# Patient Record
Sex: Male | Born: 1970 | Race: Black or African American | Hispanic: No | Marital: Married | Smoking: Former smoker
Health system: Southern US, Community
[De-identification: ages and names within clinical notes are randomized; demographics above are authoritative.]

## PROBLEM LIST (undated history)

## (undated) DIAGNOSIS — I10 Essential (primary) hypertension: Secondary | ICD-10-CM

## (undated) DIAGNOSIS — I639 Cerebral infarction, unspecified: Secondary | ICD-10-CM

## (undated) DIAGNOSIS — G4730 Sleep apnea, unspecified: Secondary | ICD-10-CM

## (undated) DIAGNOSIS — E119 Type 2 diabetes mellitus without complications: Secondary | ICD-10-CM

## (undated) DIAGNOSIS — G629 Polyneuropathy, unspecified: Secondary | ICD-10-CM

## (undated) DIAGNOSIS — Z79899 Other long term (current) drug therapy: Secondary | ICD-10-CM

## (undated) DIAGNOSIS — G35 Multiple sclerosis: Secondary | ICD-10-CM

## (undated) DIAGNOSIS — R51 Headache: Secondary | ICD-10-CM

## (undated) DIAGNOSIS — E785 Hyperlipidemia, unspecified: Secondary | ICD-10-CM

## (undated) DIAGNOSIS — IMO0002 Reserved for concepts with insufficient information to code with codable children: Secondary | ICD-10-CM

## (undated) DIAGNOSIS — I6990 Unspecified sequelae of unspecified cerebrovascular disease: Secondary | ICD-10-CM

## (undated) DIAGNOSIS — G35D Multiple sclerosis, unspecified: Secondary | ICD-10-CM

## (undated) HISTORY — DX: Cerebral infarction, unspecified: I63.9

## (undated) HISTORY — DX: Multiple sclerosis: G35

## (undated) HISTORY — PX: BRAIN BIOPSY: SHX905

## (undated) HISTORY — DX: Hyperlipidemia, unspecified: E78.5

## (undated) HISTORY — DX: Multiple sclerosis, unspecified: G35.D

## (undated) HISTORY — DX: Reserved for concepts with insufficient information to code with codable children: IMO0002

## (undated) HISTORY — PX: CARDIAC DEFIBRILLATOR PLACEMENT: SHX171

## (undated) HISTORY — DX: Other long term (current) drug therapy: Z79.899

## (undated) HISTORY — DX: Unspecified sequelae of unspecified cerebrovascular disease: I69.90

## (undated) HISTORY — DX: Sleep apnea, unspecified: G47.30

## (undated) HISTORY — DX: Polyneuropathy, unspecified: G62.9

---

## 2009-09-17 ENCOUNTER — Encounter: Admission: RE | Admit: 2009-09-17 | Discharge: 2009-09-17 | Payer: Self-pay | Admitting: *Deleted

## 2009-10-31 ENCOUNTER — Encounter: Admission: RE | Admit: 2009-10-31 | Discharge: 2009-10-31 | Payer: Self-pay | Admitting: Neurology

## 2010-12-14 ENCOUNTER — Encounter: Payer: Self-pay | Admitting: Family Medicine

## 2011-03-23 ENCOUNTER — Emergency Department (HOSPITAL_COMMUNITY)
Admission: EM | Admit: 2011-03-23 | Discharge: 2011-03-23 | Disposition: A | Discharge status: Home / Self Care | Payer: Medicare PPO | Attending: Emergency Medicine | Admitting: Emergency Medicine

## 2011-03-23 DIAGNOSIS — E119 Type 2 diabetes mellitus without complications: Secondary | ICD-10-CM | POA: Insufficient documentation

## 2011-03-23 DIAGNOSIS — Z79899 Other long term (current) drug therapy: Secondary | ICD-10-CM | POA: Insufficient documentation

## 2011-03-23 DIAGNOSIS — I10 Essential (primary) hypertension: Secondary | ICD-10-CM | POA: Insufficient documentation

## 2012-06-27 ENCOUNTER — Other Ambulatory Visit: Payer: Self-pay | Admitting: Family Medicine

## 2012-06-27 DIAGNOSIS — I69919 Unspecified symptoms and signs involving cognitive functions following unspecified cerebrovascular disease: Secondary | ICD-10-CM

## 2012-06-27 DIAGNOSIS — E785 Hyperlipidemia, unspecified: Secondary | ICD-10-CM

## 2012-07-18 ENCOUNTER — Other Ambulatory Visit: Payer: Medicare PPO

## 2012-07-19 ENCOUNTER — Ambulatory Visit
Admission: RE | Admit: 2012-07-19 | Discharge: 2012-07-19 | Disposition: A | Discharge status: Home / Self Care | Payer: Medicare PPO | Source: Ambulatory Visit | Attending: Family Medicine | Admitting: Family Medicine

## 2012-07-19 DIAGNOSIS — E785 Hyperlipidemia, unspecified: Secondary | ICD-10-CM

## 2012-07-19 DIAGNOSIS — I69919 Unspecified symptoms and signs involving cognitive functions following unspecified cerebrovascular disease: Secondary | ICD-10-CM

## 2013-06-01 ENCOUNTER — Observation Stay (HOSPITAL_COMMUNITY): Payer: Medicare PPO

## 2013-06-01 ENCOUNTER — Encounter (HOSPITAL_COMMUNITY): Payer: Self-pay | Admitting: Cardiology

## 2013-06-01 ENCOUNTER — Emergency Department (HOSPITAL_COMMUNITY): Payer: Medicare PPO

## 2013-06-01 ENCOUNTER — Inpatient Hospital Stay (HOSPITAL_COMMUNITY)
Admission: EM | Admit: 2013-06-01 | Discharge: 2013-06-06 | DRG: 065 | Disposition: A | Discharge status: Rehabilitation Facility | Payer: Medicare PPO | Attending: Internal Medicine | Admitting: Internal Medicine

## 2013-06-01 DIAGNOSIS — R471 Dysarthria and anarthria: Secondary | ICD-10-CM

## 2013-06-01 DIAGNOSIS — E785 Hyperlipidemia, unspecified: Secondary | ICD-10-CM | POA: Diagnosis present

## 2013-06-01 DIAGNOSIS — I639 Cerebral infarction, unspecified: Secondary | ICD-10-CM

## 2013-06-01 DIAGNOSIS — E1142 Type 2 diabetes mellitus with diabetic polyneuropathy: Secondary | ICD-10-CM | POA: Diagnosis present

## 2013-06-01 DIAGNOSIS — G8190 Hemiplegia, unspecified affecting unspecified side: Secondary | ICD-10-CM | POA: Diagnosis present

## 2013-06-01 DIAGNOSIS — E1149 Type 2 diabetes mellitus with other diabetic neurological complication: Secondary | ICD-10-CM | POA: Diagnosis present

## 2013-06-01 DIAGNOSIS — R1310 Dysphagia, unspecified: Secondary | ICD-10-CM | POA: Diagnosis present

## 2013-06-01 DIAGNOSIS — R29810 Facial weakness: Secondary | ICD-10-CM | POA: Diagnosis present

## 2013-06-01 DIAGNOSIS — I6350 Cerebral infarction due to unspecified occlusion or stenosis of unspecified cerebral artery: Principal | ICD-10-CM

## 2013-06-01 DIAGNOSIS — I10 Essential (primary) hypertension: Secondary | ICD-10-CM

## 2013-06-01 HISTORY — DX: Type 2 diabetes mellitus without complications: E11.9

## 2013-06-01 HISTORY — DX: Cerebral infarction, unspecified: I63.9

## 2013-06-01 HISTORY — DX: Essential (primary) hypertension: I10

## 2013-06-01 LAB — URINALYSIS, ROUTINE W REFLEX MICROSCOPIC
Glucose, UA: NEGATIVE mg/dL
Hgb urine dipstick: NEGATIVE
Ketones, ur: NEGATIVE mg/dL
Leukocytes, UA: NEGATIVE
Nitrite: NEGATIVE
Protein, ur: NEGATIVE mg/dL
Specific Gravity, Urine: 1.034 — ABNORMAL HIGH (ref 1.005–1.030)
Urobilinogen, UA: 1 mg/dL (ref 0.0–1.0)
pH: 6 (ref 5.0–8.0)

## 2013-06-01 LAB — COMPREHENSIVE METABOLIC PANEL
ALT: 23 U/L (ref 0–53)
AST: 26 U/L (ref 0–37)
Albumin: 4 g/dL (ref 3.5–5.2)
Alkaline Phosphatase: 35 U/L — ABNORMAL LOW (ref 39–117)
BUN: 29 mg/dL — ABNORMAL HIGH (ref 6–23)
CO2: 24 mEq/L (ref 19–32)
Calcium: 9.3 mg/dL (ref 8.4–10.5)
Chloride: 103 mEq/L (ref 96–112)
Creatinine, Ser: 0.96 mg/dL (ref 0.50–1.35)
GFR calc Af Amer: >90 mL/min (ref >90)
GFR calc non Af Amer: >90 mL/min (ref >90)
Glucose, Bld: 97 mg/dL (ref 70–99)
Potassium: 3.8 mEq/L (ref 3.5–5.1)
Sodium: 140 mEq/L (ref 135–145)
Total Bilirubin: 0.5 mg/dL (ref 0.3–1.2)
Total Protein: 7.5 g/dL (ref 6.0–8.3)

## 2013-06-01 LAB — GLUCOSE, CAPILLARY: Glucose-Capillary: 106 mg/dL — ABNORMAL HIGH (ref 70–99)

## 2013-06-01 LAB — CBC
HCT: 41 % (ref 39.0–52.0)
Hemoglobin: 14 g/dL (ref 13.0–17.0)
MCH: 29.1 pg (ref 26.0–34.0)
MCHC: 34.1 g/dL (ref 30.0–36.0)
MCV: 85.2 fL (ref 78.0–100.0)
Platelets: 235 10*3/uL (ref 150–400)
RBC: 4.81 MIL/uL (ref 4.22–5.81)
RDW: 12.8 % (ref 11.5–15.5)
WBC: 8 10*3/uL (ref 4.0–10.5)

## 2013-06-01 LAB — DIFFERENTIAL
Basophils Absolute: 0 10*3/uL (ref 0.0–0.1)
Basophils Relative: 0 % (ref 0–1)
Eosinophils Absolute: 0.1 10*3/uL (ref 0.0–0.7)
Eosinophils Relative: 1 % (ref 0–5)
Lymphocytes Relative: 24 % (ref 12–46)
Lymphs Abs: 1.9 10*3/uL (ref 0.7–4.0)
Monocytes Absolute: 0.6 10*3/uL (ref 0.1–1.0)
Monocytes Relative: 7 % (ref 3–12)
Neutro Abs: 5.4 10*3/uL (ref 1.7–7.7)
Neutrophils Relative %: 68 % (ref 43–77)

## 2013-06-01 LAB — RAPID URINE DRUG SCREEN, HOSP PERFORMED
Amphetamines: NOT DETECTED
Barbiturates: NOT DETECTED
Benzodiazepines: NOT DETECTED
Cocaine: NOT DETECTED
Opiates: NOT DETECTED
Tetrahydrocannabinol: NOT DETECTED

## 2013-06-01 LAB — POCT I-STAT TROPONIN I: Troponin i, poc: 0.02 ng/mL (ref 0.00–0.08)

## 2013-06-01 LAB — ANTITHROMBIN III: AntiThromb III Func: 107 % (ref 75–120)

## 2013-06-01 LAB — TROPONIN I: Troponin I: <0.3 ng/mL (ref <0.30)

## 2013-06-01 LAB — PROTIME-INR
INR: 1.03 (ref 0.00–1.49)
Prothrombin Time: 13.3 seconds (ref 11.6–15.2)

## 2013-06-01 LAB — ETHANOL: Alcohol, Ethyl (B): <11 mg/dL (ref 0–11)

## 2013-06-01 LAB — HEMOGLOBIN A1C
Hgb A1c MFr Bld: 6.3 % — ABNORMAL HIGH (ref <5.7)
Mean Plasma Glucose: 134 mg/dL — ABNORMAL HIGH (ref <117)

## 2013-06-01 LAB — APTT: aPTT: 29 seconds (ref 24–37)

## 2013-06-01 MED ORDER — INSULIN ASPART 100 UNIT/ML ~~LOC~~ SOLN
0.0000 [IU] | Freq: Three times a day (TID) | SUBCUTANEOUS | Status: DC
  Administered 2013-06-02 – 2013-06-05 (×3): 1 [IU] via SUBCUTANEOUS

## 2013-06-01 MED ORDER — METFORMIN HCL ER 500 MG PO TB24: 1000.0000 mg | ORAL_TABLET | Freq: Two times a day (BID) | ORAL | Status: DC

## 2013-06-01 MED ORDER — PREGABALIN 75 MG PO CAPS
150.0000 mg | ORAL_CAPSULE | Freq: Two times a day (BID) | ORAL | Status: DC
  Administered 2013-06-01 – 2013-06-06 (×10): 150 mg via ORAL
  Filled 2013-06-01 (×7): qty 2
  Filled 2013-06-01: qty 1
  Filled 2013-06-01: qty 2
  Filled 2013-06-01: qty 1
  Filled 2013-06-01: qty 2

## 2013-06-01 MED ORDER — CLOPIDOGREL BISULFATE 75 MG PO TABS
75.0000 mg | ORAL_TABLET | Freq: Every day | ORAL | Status: DC
  Administered 2013-06-01 – 2013-06-06 (×6): 75 mg via ORAL
  Filled 2013-06-01 (×7): qty 1

## 2013-06-01 MED ORDER — ATORVASTATIN CALCIUM 40 MG PO TABS
40.0000 mg | ORAL_TABLET | Freq: Every day | ORAL | Status: DC
  Administered 2013-06-01 – 2013-06-05 (×5): 40 mg via ORAL
  Filled 2013-06-01 (×6): qty 1

## 2013-06-01 MED ORDER — AMLODIPINE BESYLATE 5 MG PO TABS
5.0000 mg | ORAL_TABLET | Freq: Every day | ORAL | Status: DC
  Filled 2013-06-01: qty 1

## 2013-06-01 MED ORDER — METFORMIN HCL ER 500 MG PO TB24
1000.0000 mg | ORAL_TABLET | Freq: Two times a day (BID) | ORAL | Status: DC
  Administered 2013-06-02 – 2013-06-06 (×9): 1000 mg via ORAL
  Filled 2013-06-01 (×11): qty 2

## 2013-06-01 MED ORDER — INSULIN ASPART 100 UNIT/ML ~~LOC~~ SOLN: 0.0000 [IU] | Freq: Every day | SUBCUTANEOUS | Status: DC

## 2013-06-01 NOTE — Consult Note (Signed)
Referring Physician: Fonnie Jarvis    Chief Complaint: stroke  HPI:                                                                                                                                         Thomas Nixon is an 42 y.o. 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  He tells me he has had full workup in past but all the work-ups have been in different hospitals. These include, [facility], [facility], [facility] and [facility]. He does not recall having a TEE in the past nor does he recall being told he has a clotting deficiency. He has only been on Aggrenox in the past but wife feels like he was on Coumadin but was taken off this for some unknown reason--(she is not sure of this). [facility] was [facility] due to having a stroke last Saturday while on a cruise. Symptoms consisted of left facial droop, dysarthria and left leg/arm numbness.  On Sunday the ship made port in [place] and patient was brought to hospital where only MRI (per wife) was done.  He did not want to stay there and came back to [facility] where he lives to go to hospital here (for both care and possibility of going to rehab). [facility] obtained at [facility] showed chronic hemorrhagic infarcts bilaterally and acute/subacute infarct in the posterior limb internal capsule and deep white matter in the right frontal parietal lobe.    Date last known well: 7.6.14 Time last known well: Unable to determine tPA Given: No: out of window  Past Medical History  Diagnosis Date  . Stroke     5 strokes  . Hypertension   . Diabetes mellitus without complication     History reviewed. No pertinent past surgical history.  Family history: No strokes Social History:  reports that he has never smoked. He does not have any smokeless tobacco history on file. He reports that  drinks alcohol. He reports that he does not use illicit drugs.  Allergies: No Known Allergies  Medications:                                                                                                                            No current facility-administered medications for this encounter.   Current Outpatient Prescriptions  Medication Sig Dispense Refill  .  amLODipine (NORVASC) 5 MG tablet Take 5 mg by mouth daily.      Marland Kitchen atorvastatin (LIPITOR) 40 MG tablet Take 40 mg by mouth at bedtime.      . dipyridamole-aspirin (AGGRENOX) 200-25 MG per 12 hr capsule Take 1 capsule by mouth 2 (two) times daily.      . metFORMIN (GLUMETZA) 1000 MG (MOD) 24 hr tablet Take 1,000 mg by mouth 2 (two) times daily.      . pregabalin (LYRICA) 150 MG capsule Take 150 mg by mouth 2 (two) times daily.      Marland Kitchen spironolactone (ALDACTONE) 50 MG tablet Take 50 mg by mouth daily.         ROS:                                                                                                                                       History obtained from the patient  General ROS: negative for - chills, fatigue, fever, night sweats, weight gain or weight loss Psychological ROS: negative for - behavioral disorder, hallucinations, memory difficulties, mood swings or suicidal ideation Ophthalmic ROS: negative for - blurry vision, double vision, eye pain or loss of vision ENT ROS: negative for - epistaxis, nasal discharge, oral lesions, sore throat, tinnitus or vertigo Allergy and Immunology ROS: negative for - hives or itchy/watery eyes Hematological and Lymphatic ROS: negative for - bleeding problems, bruising or swollen lymph nodes Endocrine ROS: negative for - galactorrhea, hair pattern changes, polydipsia/polyuria or temperature intolerance Respiratory ROS: negative for - cough, hemoptysis, shortness of breath or wheezing Cardiovascular ROS: negative for - chest pain, dyspnea on exertion, edema or irregular heartbeat Gastrointestinal ROS: negative for - abdominal pain, diarrhea, hematemesis, nausea/vomiting or stool incontinence Genito-Urinary ROS: negative for - dysuria, hematuria,  incontinence or urinary frequency/urgency Musculoskeletal ROS: negative for - joint swelling or muscular weakness Neurological ROS: as noted in HPI Dermatological ROS: negative for rash and skin lesion changes  Neurologic Examination:                                                                                                      Blood pressure 119/77, pulse 88, temperature 98.2 F (36.8 C), temperature source Oral, resp. rate 18, SpO2 98.00%.  Mental Status: Alert, oriented, thought content appropriate.  Speech dysarthric without evidence of aphasia.  Able to follow 3 step commands without difficulty. Cranial Nerves: II: Discs flat bilaterally; Visual fields grossly normal, pupils equal, round, reactive to light and accommodation III,IV,  VI: ptosis not present, extra-ocular motions intact bilaterally V,VII: smile asymmetric on the left, facial light touch sensation decreased on the left VIII: hearing normal bilaterally IX,X: gag reflex present XI: bilateral shoulder shrug XII: midline tongue extension Motor: Right : Upper extremity   5/5    Left:     Upper extremity   4/5  Lower extremity   5/5     Lower extremity   5/5 --drift on left arm and leg Tone and bulk:normal tone throughout; no atrophy noted Sensory: Pinprick and light touch decreased on left arm and leg Deep Tendon Reflexes:  Right: Upper Extremity   Left: Upper extremity   biceps (C-5 to C-6) 2/4   biceps (C-5 to C-6) 2/4 tricep (C7) 2/4    triceps (C7) 2/4 Brachioradialis (C6) 2/4  Brachioradialis (C6) 2/4  Lower Extremity Lower Extremity  quadriceps (L-2 to L-4) 2/4   quadriceps (L-2 to L-4) 2/4 Achilles (S1) 2/4   Achilles (S1) 2/4  Plantars: Right: downgoing   Left: mute Cerebellar: normal finger-to-nose,  normal heel-to-shin test Gait: not tested CV: pulses palpable throughout    Results for orders placed during the hospital encounter of 06/01/13 (from the past 48 hour(s))  GLUCOSE, CAPILLARY      Status: Abnormal   Collection Time    06/01/13  1:07 PM      Result Value Range   Glucose-Capillary 106 (*) 70 - 99 mg/dL  URINE RAPID DRUG SCREEN (HOSP PERFORMED)     Status: None   Collection Time    06/01/13  1:36 PM      Result Value Range   Opiates NONE DETECTED  NONE DETECTED   Cocaine NONE DETECTED  NONE DETECTED   Benzodiazepines NONE DETECTED  NONE DETECTED   Amphetamines NONE DETECTED  NONE DETECTED   Tetrahydrocannabinol NONE DETECTED  NONE DETECTED   Barbiturates NONE DETECTED  NONE DETECTED   Comment:            DRUG SCREEN FOR MEDICAL PURPOSES     ONLY.  IF CONFIRMATION IS NEEDED     FOR ANY PURPOSE, NOTIFY LAB     WITHIN 5 DAYS.                LOWEST DETECTABLE LIMITS     FOR URINE DRUG SCREEN     Drug Class       Cutoff (ng/mL)     Amphetamine      1000     Barbiturate      200     Benzodiazepine   200     Tricyclics       300     Opiates          300     Cocaine          300     THC              50  URINALYSIS, ROUTINE W REFLEX MICROSCOPIC     Status: Abnormal   Collection Time    06/01/13  1:36 PM      Result Value Range   Color, Urine AMBER (*) YELLOW   Comment: BIOCHEMICALS MAY BE AFFECTED BY COLOR   APPearance CLEAR  CLEAR   Specific Gravity, Urine 1.034 (*) 1.005 - 1.030   pH 6.0  5.0 - 8.0   Glucose, UA NEGATIVE  NEGATIVE mg/dL   Hgb urine dipstick NEGATIVE  NEGATIVE   Bilirubin Urine SMALL (*) NEGATIVE   Ketones, ur NEGATIVE  NEGATIVE mg/dL   Protein, ur NEGATIVE  NEGATIVE mg/dL   Urobilinogen, UA 1.0  0.0 - 1.0 mg/dL   Nitrite NEGATIVE  NEGATIVE   Leukocytes, UA NEGATIVE  NEGATIVE   Comment: MICROSCOPIC NOT DONE ON URINES WITH NEGATIVE PROTEIN, BLOOD, LEUKOCYTES, NITRITE, OR GLUCOSE <1000 mg/dL.  ETHANOL     Status: None   Collection Time    06/01/13  2:23 PM      Result Value Range   Alcohol, Ethyl (B) <11  0 - 11 mg/dL   Comment:            LOWEST DETECTABLE LIMIT FOR     SERUM ALCOHOL IS 11 mg/dL     FOR MEDICAL PURPOSES ONLY   PROTIME-INR     Status: None   Collection Time    06/01/13  2:23 PM      Result Value Range   Prothrombin Time 13.3  11.6 - 15.2 seconds   INR 1.03  0.00 - 1.49  APTT     Status: None   Collection Time    06/01/13  2:23 PM      Result Value Range   aPTT 29  24 - 37 seconds  CBC     Status: None   Collection Time    06/01/13  2:23 PM      Result Value Range   WBC 8.0  4.0 - 10.5 K/uL   RBC 4.81  4.22 - 5.81 MIL/uL   Hemoglobin 14.0  13.0 - 17.0 g/dL   HCT 21.3  08.6 - 57.8 %   MCV 85.2  78.0 - 100.0 fL   MCH 29.1  26.0 - 34.0 pg   MCHC 34.1  30.0 - 36.0 g/dL   RDW 46.9  62.9 - 52.8 %   Platelets 235  150 - 400 K/uL  DIFFERENTIAL     Status: None   Collection Time    06/01/13  2:23 PM      Result Value Range   Neutrophils Relative % 68  43 - 77 %   Neutro Abs 5.4  1.7 - 7.7 K/uL   Lymphocytes Relative 24  12 - 46 %   Lymphs Abs 1.9  0.7 - 4.0 K/uL   Monocytes Relative 7  3 - 12 %   Monocytes Absolute 0.6  0.1 - 1.0 K/uL   Eosinophils Relative 1  0 - 5 %   Eosinophils Absolute 0.1  0.0 - 0.7 K/uL   Basophils Relative 0  0 - 1 %   Basophils Absolute 0.0  0.0 - 0.1 K/uL  COMPREHENSIVE METABOLIC PANEL     Status: Abnormal   Collection Time    06/01/13  2:23 PM      Result Value Range   Sodium 140  135 - 145 mEq/L   Potassium 3.8  3.5 - 5.1 mEq/L   Chloride 103  96 - 112 mEq/L   CO2 24  19 - 32 mEq/L   Glucose, Bld 97  70 - 99 mg/dL   BUN 29 (*) 6 - 23 mg/dL   Creatinine, Ser 4.13  0.50 - 1.35 mg/dL   Calcium 9.3  8.4 - 24.4 mg/dL   Total Protein 7.5  6.0 - 8.3 g/dL   Albumin 4.0  3.5 - 5.2 g/dL   AST 26  0 - 37 U/L   ALT 23  0 - 53 U/L   Alkaline Phosphatase 35 (*) 39 - 117 U/L   Total Bilirubin 0.5  0.3 -  1.2 mg/dL   GFR calc non Af Amer >90  >90 mL/min   GFR calc Af Amer >90  >90 mL/min   Comment:            The eGFR has been calculated     using the CKD EPI equation.     This calculation has not been     validated in all clinical     situations.      eGFR's persistently     <90 mL/min signify     possible Chronic Kidney Disease.  TROPONIN I     Status: None   Collection Time    06/01/13  2:23 PM      Result Value Range   Troponin I <0.30  <0.30 ng/mL   Comment:            Due to the release kinetics of cTnI,     a negative result within the first hours     of the onset of symptoms does not rule out     myocardial infarction with certainty.     If myocardial infarction is still suspected,     repeat the test at appropriate intervals.  POCT I-STAT TROPONIN I     Status: None   Collection Time    06/01/13  2:45 PM      Result Value Range   Troponin i, poc 0.02  0.00 - 0.08 ng/mL   Comment 3            Comment: Due to the release kinetics of cTnI,     a negative result within the first hours     of the onset of symptoms does not rule out     myocardial infarction with certainty.     If myocardial infarction is still suspected,     repeat the test at appropriate intervals.   Mr Maxine Glenn Head Wo Contrast  06/01/2013   *RADIOLOGY REPORT*  Clinical Data:  Left-sided weakness.  Stroke.  Hypertension and diabetes  MRI HEAD WITHOUT CONTRAST MRA HEAD WITHOUT CONTRAST  Technique:  Multiplanar, multiecho pulse sequences of the brain and surrounding structures were obtained without intravenous contrast. Angiographic images of the head were obtained using MRA technique without contrast.  Comparison:  None  MRI HEAD  Findings:  There is considerable hyperintensity in the cerebral white matter bilaterally consistent with chronic microvascular ischemia.  There is a chronic hemorrhagic infarct in the right frontal parietal lobe.  There is a chronic hemorrhagic infarct in the left parietal white matter.  Diffusion weighted imaging shows hyperintensity in the posterior limb internal capsule on the right extending into the cerebral white matter.  This appears to have restricted diffusion consistent with acute or  subacute infarct.  Ventricle size is normal.  No  midline shift.  No neoplastic process is identified.  IMPRESSION: The extensive chronic ischemic changes for age consist with diabetes and hypertension.  Chronic hemorrhagic infarcts are present bilaterally.  Acute/subacute infarct in the posterior limb internal capsule and deep white matter in the right frontal parietal lobe.  MRA HEAD  Findings: Vertebral arteries are patent to the basilar without stenosis.  PICA, superior cerebellar, and posterior cerebral arteries are patent bilaterally.  Basilar is widely patent.  Cavernous carotid is widely patent bilaterally.  Anterior and middle cerebral arteries are patent bilaterally without significant stenosis.  Negative for cerebral aneurysm.  IMPRESSION: No significant intracranial stenosis.   Original Report Authenticated By: Janeece Riggers, M.D.   Mr Brain Ilda Basset  Contrast  06/01/2013   *RADIOLOGY REPORT*  Clinical Data:  Left-sided weakness.  Stroke.  Hypertension and diabetes  MRI HEAD WITHOUT CONTRAST MRA HEAD WITHOUT CONTRAST  Technique:  Multiplanar, multiecho pulse sequences of the brain and surrounding structures were obtained without intravenous contrast. Angiographic images of the head were obtained using MRA technique without contrast.  Comparison:  None  MRI HEAD  Findings:  There is considerable hyperintensity in the cerebral white matter bilaterally consistent with chronic microvascular ischemia.  There is a chronic hemorrhagic infarct in the right frontal parietal lobe.  There is a chronic hemorrhagic infarct in the left parietal white matter.  Diffusion weighted imaging shows hyperintensity in the posterior limb internal capsule on the right extending into the cerebral white matter.  This appears to have restricted diffusion consistent with acute or  subacute infarct.  Ventricle size is normal.  No midline shift.  No neoplastic process is identified.  IMPRESSION: The extensive chronic ischemic changes for age consist with diabetes and hypertension.  Chronic  hemorrhagic infarcts are present bilaterally.  Acute/subacute infarct in the posterior limb internal capsule and deep white matter in the right frontal parietal lobe.  MRA HEAD  Findings: Vertebral arteries are patent to the basilar without stenosis.  PICA, superior cerebellar, and posterior cerebral arteries are patent bilaterally.  Basilar is widely patent.  Cavernous carotid is widely patent bilaterally.  Anterior and middle cerebral arteries are patent bilaterally without significant stenosis.  Negative for cerebral aneurysm.  IMPRESSION: No significant intracranial stenosis.   Original Report Authenticated By: Janeece Riggers, M.D.    Assessment and plan discussed with with attending physician and they are in agreement.    Felicie Morn PA-C [facility] [phone]  06/01/2013, 3:33 PM   Assessment: 42 y.o. male with multiple strokes in the past now presenting with subacute stroke that occurred 5 days ago while on a cruise.  It appears that very little in the way of stroke workup was done at the other hospital, and therefore I would favor admitting for further stroke workup including echocardiogram as well as carotid Dopplers.  He apparently has been on Coumadin in the past but this was stopped, it is unclear to me if he has never been formally diagnosed with a hypercoagulable state. The last time he was worked up was in 2009.  Stroke Risk Factors - diabetes mellitus, hyperlipidemia and hypertension  Plan: 1. HgbA1c, fasting lipid panel 4. Echocardiogram 5. Carotid dopplers 6. Prophylactic therapy-Antiplatelet med: Plavix - dose 75 mg daily 7. Risk factor modification 8. Hypercoagulable panel 9. Telemetry monitoring 10. Frequent neuro checks 11. PT/OT SLP  Ritta Slot, MD [facility] [phone]  If 7pm- 7am, please page neurology on call at [phone].

## 2013-06-01 NOTE — ED Notes (Signed)
Notified lab of need for blood work  

## 2013-06-01 NOTE — H&P (Signed)
[facility] History and Physical  Thomas Nixon GEX:[id number] DOB: Mar 17, 1971    PCP:   Thomas Saupe, MD   Chief Complaint: Seeking rehab after having stroke last week.  HPI: Thomas Nixon is an 42 y.o. 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  He had left side facial droop along with left sided weakness and increased dysarthria last week while he was on a cruise.  MRI done last week reportedly showed an acute CVA and he was recommended rehab, but preferred to be in [place].  He tells me he had complete work up several years ago, and had a brain biopsy, though they don't know what it showed.  MRI repeated here without contrast showed acute, subacute infarct in the internal capsule and deep right frontoparietal area with chronic hemorrhagic infarcts.  EKG showed NSR, and serology was unramarkable.  Neurology was consulted and recommended full repeat CVA work up, to change to Plavix, and to include thrombophilia work up.  Hospitalist was asked to admit him.  Rewiew of Systems:  Constitutional: Negative for malaise, fever and chills. No significant weight loss or weight gain Eyes: Negative for eye pain, redness and discharge, diplopia, visual changes, or flashes of light. ENMT: Negative for ear pain, hoarseness, nasal congestion, sinus pressure and sore throat. No headaches; tinnitus, drooling, or problem swallowing. Cardiovascular: Negative for chest pain, palpitations, diaphoresis, dyspnea and peripheral edema. ; No orthopnea, PND Respiratory: Negative for cough, hemoptysis, wheezing and stridor. No pleuritic chestpain. Gastrointestinal: Negative for nausea, vomiting, diarrhea, constipation, abdominal pain, melena, blood in stool, hematemesis, jaundice and rectal bleeding.    Genitourinary: Negative for frequency, dysuria, incontinence,flank pain and hematuria; Musculoskeletal: Negative for back pain and  neck pain. Negative for swelling and trauma.;  Skin: . Negative for pruritus, rash, abrasions, bruising and skin lesion.; ulcerations Neuro: Negative for headache, lightheadedness and neck stiffness. Negative altered level of consciousness , altered mental status,  burning feet, involuntary movement, seizure and syncope.  Psych: negative for anxiety, depression, insomnia, tearfulness, panic attacks, hallucinations, paranoia, suicidal or homicidal ideation    Past Medical History  Diagnosis Date  . Stroke     5 strokes  . Hypertension   . Diabetes mellitus without complication     History reviewed. No pertinent past surgical history.  Medications:  HOME MEDS: Prior to Admission medications   Medication Sig Start Date End Date Taking? Authorizing Provider  amLODipine (NORVASC) 5 MG tablet Take 5 mg by mouth daily.   Yes Historical Provider, MD  atorvastatin (LIPITOR) 40 MG tablet Take 40 mg by mouth at bedtime.   Yes Historical Provider, MD  dipyridamole-aspirin (AGGRENOX) 200-25 MG per 12 hr capsule Take 1 capsule by mouth 2 (two) times daily.   Yes Historical Provider, MD  metFORMIN (GLUMETZA) 1000 MG (MOD) 24 hr tablet Take 1,000 mg by mouth 2 (two) times daily.   Yes Historical Provider, MD  pregabalin (LYRICA) 150 MG capsule Take 150 mg by mouth 2 (two) times daily.   Yes Historical Provider, MD  spironolactone (ALDACTONE) 50 MG tablet Take 50 mg by mouth daily.   Yes Historical Provider, MD     Allergies:  No Known Allergies  Social History:   reports that he has never smoked. He does not have any smokeless tobacco history on file. He reports that  drinks alcohol. He reports that he does not use illicit drugs.  Family History: History reviewed.  No pertinent family history.   Physical Exam: Filed Vitals:   06/01/13 1915 06/01/13 1930 06/01/13 1945 06/01/13 2000  BP: 129/81 135/82 121/72 112/75  Pulse: 64 64 66 67  Temp:      TempSrc:      Resp:      SpO2: 100% 100%  99% 99%   Blood pressure 112/75, pulse 67, temperature 98.8 F (37.1 C), temperature source Oral, resp. rate 18, SpO2 99.00%.  GEN:  Pleasant patient lying in the stretcher in no acute distress; cooperative with exam. PSYCH:  alert and oriented x4; does not appear anxious or depressed; affect is appropriate. HEENT: Mucous membranes pink and anicteric; PERRLA; EOM intact; no cervical lymphadenopathy nor thyromegaly or carotid bruit; no JVD; There were no stridor. Neck is very supple. Breasts:: Not examined CHEST WALL: No tenderness CHEST: Normal respiration, clear to auscultation bilaterally.  HEART: Regular rate and rhythm.  There are no murmur, rub, or gallops.   BACK: No kyphosis or scoliosis; no CVA tenderness ABDOMEN: soft and non-tender; no masses, no organomegaly, normal abdominal bowel sounds; no pannus; no intertriginous candida. There is no rebound and no distention. Rectal Exam: Not done EXTREMITIES: No bone or joint deformity; age-appropriate arthropathy of the hands and knees; no edema; no ulcerations.  There is no calf tenderness. Genitalia: not examined PULSES: 2+ and symmetric SKIN: Normal hydration no rash or ulceration CNS: Cranial nerves 2-12 grossly intact no focal lateralizing neurologic deficit.  Speech is dysarthric, uvula elevated with phonation, facial symmetry and tongue midline. DTR are normal bilaterally, cerebella exam is intact, barbinski is negative and strengths are equaled bilaterally.  No sensory loss.   Labs on Admission:  Basic Metabolic Panel:  Recent Labs Lab 06/01/13 1423  NA 140  K 3.8  CL 103  CO2 24  GLUCOSE 97  BUN 29*  CREATININE 0.96  CALCIUM 9.3   Liver Function Tests:  Recent Labs Lab 06/01/13 1423  AST 26  ALT 23  ALKPHOS 35*  BILITOT 0.5  PROT 7.5  ALBUMIN 4.0   No results found for this basename: LIPASE, AMYLASE,  in the last 168 hours No results found for this basename: AMMONIA,  in the last 168 hours CBC:  Recent  Labs Lab 06/01/13 1423  WBC 8.0  NEUTROABS 5.4  HGB 14.0  HCT 41.0  MCV 85.2  PLT 235   Cardiac Enzymes:  Recent Labs Lab 06/01/13 1423  TROPONINI <0.30    CBG:  Recent Labs Lab 06/01/13 1307  GLUCAP 106*     Radiological Exams on Admission: Mr Maxine Glenn Head Wo Contrast  06/01/2013   *RADIOLOGY REPORT*  Clinical Data:  Left-sided weakness.  Stroke.  Hypertension and diabetes  MRI HEAD WITHOUT CONTRAST MRA HEAD WITHOUT CONTRAST  Technique:  Multiplanar, multiecho pulse sequences of the brain and surrounding structures were obtained without intravenous contrast. Angiographic images of the head were obtained using MRA technique without contrast.  Comparison:  None  MRI HEAD  Findings:  There is considerable hyperintensity in the cerebral white matter bilaterally consistent with chronic microvascular ischemia.  There is a chronic hemorrhagic infarct in the right frontal parietal lobe.  There is a chronic hemorrhagic infarct in the left parietal white matter.  Diffusion weighted imaging shows hyperintensity in the posterior limb internal capsule on the right extending into the cerebral white matter.  This appears to have restricted diffusion consistent with acute or  subacute infarct.  Ventricle size is normal.  No midline shift.  No neoplastic process  is identified.  IMPRESSION: The extensive chronic ischemic changes for age consist with diabetes and hypertension.  Chronic hemorrhagic infarcts are present bilaterally.  Acute/subacute infarct in the posterior limb internal capsule and deep white matter in the right frontal parietal lobe.  MRA HEAD  Findings: Vertebral arteries are patent to the basilar without stenosis.  PICA, superior cerebellar, and posterior cerebral arteries are patent bilaterally.  Basilar is widely patent.  Cavernous carotid is widely patent bilaterally.  Anterior and middle cerebral arteries are patent bilaterally without significant stenosis.  Negative for cerebral  aneurysm.  IMPRESSION: No significant intracranial stenosis.   Original Report Authenticated By: Janeece Riggers, M.D.   Mr Brain Wo Contrast  06/01/2013   *RADIOLOGY REPORT*  Clinical Data:  Left-sided weakness.  Stroke.  Hypertension and diabetes  MRI HEAD WITHOUT CONTRAST MRA HEAD WITHOUT CONTRAST  Technique:  Multiplanar, multiecho pulse sequences of the brain and surrounding structures were obtained without intravenous contrast. Angiographic images of the head were obtained using MRA technique without contrast.  Comparison:  None  MRI HEAD  Findings:  There is considerable hyperintensity in the cerebral white matter bilaterally consistent with chronic microvascular ischemia.  There is a chronic hemorrhagic infarct in the right frontal parietal lobe.  There is a chronic hemorrhagic infarct in the left parietal white matter.  Diffusion weighted imaging shows hyperintensity in the posterior limb internal capsule on the right extending into the cerebral white matter.  This appears to have restricted diffusion consistent with acute or  subacute infarct.  Ventricle size is normal.  No midline shift.  No neoplastic process is identified.  IMPRESSION: The extensive chronic ischemic changes for age consist with diabetes and hypertension.  Chronic hemorrhagic infarcts are present bilaterally.  Acute/subacute infarct in the posterior limb internal capsule and deep white matter in the right frontal parietal lobe.  MRA HEAD  Findings: Vertebral arteries are patent to the basilar without stenosis.  PICA, superior cerebellar, and posterior cerebral arteries are patent bilaterally.  Basilar is widely patent.  Cavernous carotid is widely patent bilaterally.  Anterior and middle cerebral arteries are patent bilaterally without significant stenosis.  Negative for cerebral aneurysm.  IMPRESSION: No significant intracranial stenosis.   Original Report Authenticated By: Janeece Riggers, M.D.    EKG: Independently reviewed. NSR. No  acute ST-T changes.  Borderline 1 degree AVB.   Assessment/Plan  CVA HTN DM2  PLAN:  WIll admit for completing CVA work up.  Change ASA to Plavix.  Will obtain thrombophilic work up. He will be admitted to rehab.  For his DM2, will continue Metformin and use sensitive SSI.  He is stable, full code, and will be admitted to [facility] service under telemetry.  Thank you for allowing me to partake in the care of this nice gentleman.  Other plans as per orders.  Code Status: FULL Unk Lightning, MD. [facility] Pager [phone] 7pm to 7am.  06/01/2013, 8:42 PM

## 2013-06-01 NOTE — ED Notes (Signed)
Pt reports that he was on cruise and on Saturday morning he had a stroke while on the boat. States that he was seen and treated in a hospital and released 3 days ago. Reports that he feels increasely weak since leaving the hospital on the left side. Pt with decreased grip strength on the left side. Facial droop noted to the left side, but pt reports that this was present when symptoms started on the cruise. Pt A&Ox4. No distress noted.

## 2013-06-01 NOTE — ED Notes (Signed)
Returned from MRI 

## 2013-06-01 NOTE — Progress Notes (Signed)
Physical medicine rehabilitation consult requested chart is been reviewed and discussed with admission rehabilitation coordinator's. At this time we'll await full neuro workup with MRI pending at this time. We'll followup accordingly

## 2013-06-01 NOTE — ED Provider Notes (Signed)
History    CSN: [id number] Arrival date & time 06/01/13  1245  First MD Initiated Contact with Patient 06/01/13 1305     Chief Complaint  Patient presents with  . Weakness   (Consider location/radiation/quality/duration/timing/severity/associated sxs/prior Treatment) HPI This 42 year old male is a 5 strokes, his diabetes hypertension and a clotting disorder and takes Aggrenox, he was on a cruise last known well at Saturday night approximately 10 PM when he woke up Sunday morning he had new left face arm and leg weakness and numbness, at baseline he has mild memory loss and mild expressive aphasia and unsteady gait, he is no fever or trauma headache altered mental status change in vision change in swallowing change in speech chest pain palpitations shortness breath abdominal pain vomiting vertigo or other concerns, he was hospitalized in [place] and had an MRI showing new stroke without bleed, over the last 2 days the patient does notice some worsening weakness on his left arm and leg and just got back in town today and came straight to the emergency department to consider admission for rehabilitation because he did not want to have rehabilitation in [place] and wanted to be home.he is not a code stroke candidate since he has had his new stroke symptoms for at least 5 days. Past Medical History  Diagnosis Date  . Stroke     5 strokes  . Hypertension   . Diabetes mellitus without complication    History reviewed. No pertinent past surgical history. History reviewed. No pertinent family history. History  Substance Use Topics  . Smoking status: Never Smoker   . Smokeless tobacco: Not on file  . Alcohol Use: Yes    Review of Systems 10 Systems reviewed and are negative for acute change except as noted in the HPI. Allergies  Review of patient's allergies indicates no known allergies.  Home Medications   No current outpatient prescriptions on file. BP 126/79  Pulse 57  Temp(Src)  99.1 F (37.3 C) (Oral)  Resp 20  Ht 5\' 10"  (1.778 m)  Wt 231 lb 4.2 oz (104.9 kg)  BMI 33.18 kg/m2  SpO2 100% Physical Exam  Nursing note and vitals reviewed. Constitutional: He is oriented to person, place, and time.  Awake, alert, nontoxic appearance with baseline speech for patient.  HENT:  Head: Atraumatic.  Mouth/Throat: No oropharyngeal exudate.  Eyes: EOM are normal. Pupils are equal, round, and reactive to light. Right eye exhibits no discharge. Left eye exhibits no discharge.  Neck: Neck supple.  Cardiovascular: Normal rate and regular rhythm.   No murmur heard. Pulmonary/Chest: Effort normal and breath sounds normal. No stridor. No respiratory distress. He has no wheezes. He has no rales. He exhibits no tenderness.  Abdominal: Soft. Bowel sounds are normal. He exhibits no mass. There is no tenderness. There is no rebound.  Musculoskeletal: He exhibits no edema and no tenderness.  Baseline ROM, moves extremities with no obvious new focal weakness.  Lymphadenopathy:    He has no cervical adenopathy.  Neurological: He is alert and oriented to person, place, and time.  Awake, alert, cooperative and aware of situation; motor strength moderate left arm and leg weakness; sensation decreased light touch left face arm and leg; peripheral visual fields full to confrontation; moderate left lower facial droop facial asymmetry; tongue midline; otherwisemajor cranial nerves appear intact; mild left abnormal arm and legpronator drift, normal finger to nose bilaterally  Skin: No rash noted.  Psychiatric: He has a normal mood and affect.  ED Course  Procedures (including critical care time) ECG: normal sinus rhythm, ventricular rate 84, normal axis, no acute ischemic changes noted, no comparison ECG immediately available  D/w [facility] rec CM/SW to see if Pt can get into Rehab. ~1415 CM recommended consult Rehab (done).  No response from rehab; [facility] paged. [phone] [facility] will  see Pt to consider options since no evaluation done by Rehab.1910 Patient / Family / Caregiver informed of clinical course, understand medical decision-making process, and agree with plan.  Labs Reviewed  GLUCOSE, CAPILLARY - Abnormal; Notable for the following:    Glucose-Capillary 106 (*)    All other components within normal limits  COMPREHENSIVE METABOLIC PANEL - Abnormal; Notable for the following:    BUN 29 (*)    Alkaline Phosphatase 35 (*)    All other components within normal limits  URINALYSIS, ROUTINE W REFLEX MICROSCOPIC - Abnormal; Notable for the following:    Color, Urine AMBER (*)    Specific Gravity, Urine 1.034 (*)    Bilirubin Urine SMALL (*)    All other components within normal limits  HEMOGLOBIN A1C - Abnormal; Notable for the following:    Hemoglobin A1C 6.3 (*)    Mean Plasma Glucose 134 (*)    All other components within normal limits  LIPID PANEL - Abnormal; Notable for the following:    HDL 34 (*)    All other components within normal limits  PROTEIN C ACTIVITY - Abnormal; Notable for the following:    Protein C Activity 180 (*)    All other components within normal limits  PROTEIN S ACTIVITY - Abnormal; Notable for the following:    Protein S Activity 144 (*)    All other components within normal limits  LUPUS ANTICOAGULANT PANEL - Abnormal; Notable for the following:    Drvvt 46.2 (*)    All other components within normal limits  CARDIOLIPIN ANTIBODIES, IGG, IGM, IGA - Abnormal; Notable for the following:    Anticardiolipin IgG 4 (*)    Anticardiolipin IgM 1 (*)    Anticardiolipin IgA 10 (*)    All other components within normal limits  HEMOGLOBIN A1C - Abnormal; Notable for the following:    Hemoglobin A1C 6.2 (*)    Mean Plasma Glucose 131 (*)    All other components within normal limits  GLUCOSE, CAPILLARY - Abnormal; Notable for the following:    Glucose-Capillary 133 (*)    All other components within normal limits  GLUCOSE, CAPILLARY -  Abnormal; Notable for the following:    Glucose-Capillary 143 (*)    All other components within normal limits  C4 COMPLEMENT - Abnormal; Notable for the following:    Complement C4, Body Fluid 44 (*)    All other components within normal limits  GLUCOSE, CAPILLARY - Abnormal; Notable for the following:    Glucose-Capillary 123 (*)    All other components within normal limits  GLUCOSE, CAPILLARY - Abnormal; Notable for the following:    Glucose-Capillary 113 (*)    All other components within normal limits  GLUCOSE, CAPILLARY - Abnormal; Notable for the following:    Glucose-Capillary 128 (*)    All other components within normal limits  GLUCOSE, CAPILLARY - Abnormal; Notable for the following:    Glucose-Capillary 104 (*)    All other components within normal limits  GLUCOSE, CAPILLARY - Abnormal; Notable for the following:    Glucose-Capillary 107 (*)    All other components within normal limits  ETHANOL  PROTIME-INR  APTT  CBC  DIFFERENTIAL  TROPONIN I  URINE RAPID DRUG SCREEN (HOSP PERFORMED)  ANTITHROMBIN III  PROTEIN C, TOTAL  PROTEIN S, TOTAL  BETA-2-GLYCOPROTEIN I ABS, IGG/M/A  HOMOCYSTEINE  FACTOR 5 LEIDEN  PROTHROMBIN GENE MUTATION  URINE RAPID DRUG SCREEN (HOSP PERFORMED)  SICKLE CELL SCREEN  C3 COMPLEMENT  SEDIMENTATION RATE  RPR  GLUCOSE, CAPILLARY  ANA  COMPLEMENT, TOTAL  POCT I-STAT TROPONIN I   Dg Chest Port 1 View  06/01/2013   *RADIOLOGY REPORT*  Clinical Data: Hypertension.  Right side crackles.  PORTABLE CHEST - 1 VIEW  Comparison: None.  Findings: Elevation of the right hemidiaphragm.  No confluent airspace opacities.  No effusions.  Heart is borderline in size. No acute bony abnormality.  IMPRESSION: No acute cardiopulmonary disease.  Elevation of the right hemidiaphragm.   Original Report Authenticated By: Charlett Nose, M.D.   1. Stroke   2. Dysarthria   3. Hypertension     MDM  The patient appears reasonably stabilized for admission  considering the current resources, flow, and capabilities available in the ED at this time, and I doubt any other [facility] requiring further screening and/or treatment in the ED prior to admission.  Hurman Horn, MD 06/03/13 2003

## 2013-06-02 DIAGNOSIS — I6330 Cerebral infarction due to thrombosis of unspecified cerebral artery: Secondary | ICD-10-CM

## 2013-06-02 DIAGNOSIS — I6789 Other cerebrovascular disease: Secondary | ICD-10-CM

## 2013-06-02 LAB — RAPID URINE DRUG SCREEN, HOSP PERFORMED
Amphetamines: NOT DETECTED
Barbiturates: NOT DETECTED
Benzodiazepines: NOT DETECTED
Cocaine: NOT DETECTED
Opiates: NOT DETECTED
Tetrahydrocannabinol: NOT DETECTED

## 2013-06-02 LAB — BETA-2-GLYCOPROTEIN I ABS, IGG/M/A
Beta-2 Glyco I IgG: 0 G Units (ref <20)
Beta-2-Glycoprotein I IgA: 0 A Units (ref <20)
Beta-2-Glycoprotein I IgM: 7 M Units (ref <20)

## 2013-06-02 LAB — HEMOGLOBIN A1C
Hgb A1c MFr Bld: 6.2 % — ABNORMAL HIGH (ref <5.7)
Mean Plasma Glucose: 131 mg/dL — ABNORMAL HIGH (ref <117)

## 2013-06-02 LAB — CARDIOLIPIN ANTIBODIES, IGG, IGM, IGA
Anticardiolipin IgA: 10 APL U/mL — ABNORMAL LOW (ref <22)
Anticardiolipin IgG: 4 GPL U/mL — ABNORMAL LOW (ref <23)
Anticardiolipin IgM: 1 MPL U/mL — ABNORMAL LOW (ref <11)

## 2013-06-02 LAB — SEDIMENTATION RATE: Sed Rate: 11 mm/hr (ref 0–16)

## 2013-06-02 LAB — FACTOR 5 LEIDEN

## 2013-06-02 LAB — LUPUS ANTICOAGULANT PANEL
DRVVT: 46.2 secs — ABNORMAL HIGH (ref <42.9)
Lupus Anticoagulant: NOT DETECTED
PTT Lupus Anticoagulant: 30.4 secs (ref 28.0–43.0)
dRVVT Incubated 1:1 Mix: 36.6 secs (ref <42.9)

## 2013-06-02 LAB — LIPID PANEL
Cholesterol: 142 mg/dL (ref 0–200)
HDL: 34 mg/dL — ABNORMAL LOW (ref >39)
LDL Cholesterol: 92 mg/dL (ref 0–99)
Total CHOL/HDL Ratio: 4.2 RATIO
Triglycerides: 81 mg/dL (ref <150)
VLDL: 16 mg/dL (ref 0–40)

## 2013-06-02 LAB — PROTHROMBIN GENE MUTATION

## 2013-06-02 LAB — GLUCOSE, CAPILLARY
Glucose-Capillary: 133 mg/dL — ABNORMAL HIGH (ref 70–99)
Glucose-Capillary: 143 mg/dL — ABNORMAL HIGH (ref 70–99)
Glucose-Capillary: 123 mg/dL — ABNORMAL HIGH (ref 70–99)
Glucose-Capillary: 113 mg/dL — ABNORMAL HIGH (ref 70–99)

## 2013-06-02 LAB — PROTEIN C ACTIVITY: Protein C Activity: 180 % — ABNORMAL HIGH (ref 75–133)

## 2013-06-02 LAB — PROTEIN S, TOTAL: Protein S Ag, Total: 99 % (ref 60–150)

## 2013-06-02 LAB — HOMOCYSTEINE: Homocysteine: 6.8 umol/L (ref 4.0–15.4)

## 2013-06-02 LAB — PROTEIN S ACTIVITY: Protein S Activity: 144 % — ABNORMAL HIGH (ref 69–129)

## 2013-06-02 LAB — RPR: RPR Ser Ql: NONREACTIVE

## 2013-06-02 LAB — PROTEIN C, TOTAL: Protein C, Total: 101 % (ref 72–160)

## 2013-06-02 MED ORDER — SODIUM CHLORIDE 0.9 % IV SOLN
INTRAVENOUS | Status: DC
  Administered 2013-06-02 (×2): via INTRAVENOUS

## 2013-06-02 NOTE — Progress Notes (Signed)
Rehab Admissions Coordinator Note:  Patient was screened by Clois Dupes for appropriateness for an Inpatient Acute Rehab Consult. We are assessing pt today for the possibility of inpt rehab based on current status. Consult pending. CM in [place] previously contacted our rehab for admission. [employer] at that time declined approval based on his clinical and functional status at the time. Admission Coordinator, Roderic Palau, discussed with wife via phone earlier this week. Melanee Spry will follow up on this case [phone].  Clois Dupes 06/02/2013, 10:33 AM  I can be reached at [phone].

## 2013-06-02 NOTE — Progress Notes (Signed)
[facility] PROGRESS NOTE  Thomas Nixon NWG:[id number] DOB: Apr 05, 1971 DOA: 06/01/2013 PCP: Cain Saupe, MD  Assessment/Plan: Recurrent CVA: - Work up under way - Neurology on board - For now, cont with plavix - f/u hypercoag panel - PT/OT - CIR consulted  DM: - On metformin w/ SSI  HTN: - BP stable - Temporarily hold bp meds to allow permissive htn  Code Status: Full Family Communication: Pt in room (indicate person spoken with, relationship, and if by phone, the number) Disposition Plan: Pending   Consultants:  Neurology  Procedures:  2D echo pending  Carotid dopplers pending  HPI/Subjective: No complaints.  Objective: Filed Vitals:   06/02/13 0047 06/02/13 0245 06/02/13 0455 06/02/13 0500  BP: 122/71 121/72 114/71 114/67  Pulse: 77 74 61 65  Temp: 98.2 F (36.8 C) 98.2 F (36.8 C) 98.1 F (36.7 C) 98 F (36.7 C)  TempSrc: Oral Oral Oral Oral  Resp: 18 18 16 18   Height:      Weight:      SpO2: 100% 99% 99% 98%   No intake or output data in the 24 hours ending 06/02/13 1046 Filed Weights   06/01/13 2051  Weight: 104.9 kg (231 lb 4.2 oz)    Exam:   General:  Awake, in nad  Cardiovascular: regular, s1, s2  Respiratory: normal resp effort, no wheezing  Abdomen: soft, nondistended  Musculoskeletal: perfused, no clubbing   Data Reviewed: Basic Metabolic Panel:  Recent Labs Lab 06/01/13 1423  NA 140  K 3.8  CL 103  CO2 24  GLUCOSE 97  BUN 29*  CREATININE 0.96  CALCIUM 9.3   Liver Function Tests:  Recent Labs Lab 06/01/13 1423  AST 26  ALT 23  ALKPHOS 35*  BILITOT 0.5  PROT 7.5  ALBUMIN 4.0   No results found for this basename: LIPASE, AMYLASE,  in the last 168 hours No results found for this basename: AMMONIA,  in the last 168 hours CBC:  Recent Labs Lab 06/01/13 1423  WBC 8.0  NEUTROABS 5.4  HGB 14.0  HCT 41.0  MCV 85.2  PLT 235   Cardiac Enzymes:  Recent Labs Lab 06/01/13 1423  TROPONINI <0.30    BNP (last 3 results) No results found for this basename: PROBNP,  in the last 8760 hours CBG:  Recent Labs Lab 06/01/13 1307 06/01/13 2219 06/02/13 0704  GLUCAP 106* 133* 143*    No results found for this or any previous visit (from the past 240 hour(s)).   Studies: Mr Shirlee Latch [facility] Contrast  06/01/2013   *RADIOLOGY REPORT*  Clinical Data:  Left-sided weakness.  Stroke.  Hypertension and diabetes  MRI HEAD WITHOUT CONTRAST MRA HEAD WITHOUT CONTRAST  Technique:  Multiplanar, multiecho pulse sequences of the brain and surrounding structures were obtained without intravenous contrast. Angiographic images of the head were obtained using MRA technique without contrast.  Comparison:  None  MRI HEAD  Findings:  There is considerable hyperintensity in the cerebral white matter bilaterally consistent with chronic microvascular ischemia.  There is a chronic hemorrhagic infarct in the right frontal parietal lobe.  There is a chronic hemorrhagic infarct in the left parietal white matter.  Diffusion weighted imaging shows hyperintensity in the posterior limb internal capsule on the right extending into the cerebral white matter.  This appears to have restricted diffusion consistent with acute or  subacute infarct.  Ventricle size is normal.  No midline shift.  No neoplastic process is identified.  IMPRESSION: The extensive chronic ischemic changes  for age consist with diabetes and hypertension.  Chronic hemorrhagic infarcts are present bilaterally.  Acute/subacute infarct in the posterior limb internal capsule and deep white matter in the right frontal parietal lobe.  MRA HEAD  Findings: Vertebral arteries are patent to the basilar without stenosis.  PICA, superior cerebellar, and posterior cerebral arteries are patent bilaterally.  Basilar is widely patent.  Cavernous carotid is widely patent bilaterally.  Anterior and middle cerebral arteries are patent bilaterally without significant stenosis.  Negative for  cerebral aneurysm.  IMPRESSION: No significant intracranial stenosis.   Original Report Authenticated By: Janeece Riggers, M.D.   Mr Brain Wo Contrast  06/01/2013   *RADIOLOGY REPORT*  Clinical Data:  Left-sided weakness.  Stroke.  Hypertension and diabetes  MRI HEAD WITHOUT CONTRAST MRA HEAD WITHOUT CONTRAST  Technique:  Multiplanar, multiecho pulse sequences of the brain and surrounding structures were obtained without intravenous contrast. Angiographic images of the head were obtained using MRA technique without contrast.  Comparison:  None  MRI HEAD  Findings:  There is considerable hyperintensity in the cerebral white matter bilaterally consistent with chronic microvascular ischemia.  There is a chronic hemorrhagic infarct in the right frontal parietal lobe.  There is a chronic hemorrhagic infarct in the left parietal white matter.  Diffusion weighted imaging shows hyperintensity in the posterior limb internal capsule on the right extending into the cerebral white matter.  This appears to have restricted diffusion consistent with acute or  subacute infarct.  Ventricle size is normal.  No midline shift.  No neoplastic process is identified.  IMPRESSION: The extensive chronic ischemic changes for age consist with diabetes and hypertension.  Chronic hemorrhagic infarcts are present bilaterally.  Acute/subacute infarct in the posterior limb internal capsule and deep white matter in the right frontal parietal lobe.  MRA HEAD  Findings: Vertebral arteries are patent to the basilar without stenosis.  PICA, superior cerebellar, and posterior cerebral arteries are patent bilaterally.  Basilar is widely patent.  Cavernous carotid is widely patent bilaterally.  Anterior and middle cerebral arteries are patent bilaterally without significant stenosis.  Negative for cerebral aneurysm.  IMPRESSION: No significant intracranial stenosis.   Original Report Authenticated By: Janeece Riggers, M.D.   Dg Chest Port 1 View  06/01/2013    *RADIOLOGY REPORT*  Clinical Data: Hypertension.  Right side crackles.  PORTABLE CHEST - 1 VIEW  Comparison: None.  Findings: Elevation of the right hemidiaphragm.  No confluent airspace opacities.  No effusions.  Heart is borderline in size. No acute bony abnormality.  IMPRESSION: No acute cardiopulmonary disease.  Elevation of the right hemidiaphragm.   Original Report Authenticated By: Charlett Nose, M.D.    Scheduled Meds: . amLODipine  5 mg Oral Daily  . atorvastatin  40 mg Oral QHS  . clopidogrel  75 mg Oral Q breakfast  . insulin aspart  0-5 Units Subcutaneous QHS  . insulin aspart  0-9 Units Subcutaneous TID WC  . metFORMIN  1,000 mg Oral BID WC  . pregabalin  150 mg Oral BID   Continuous Infusions: . sodium chloride      Principal Problem:   Stroke Active Problems:   Dysarthria   Hypertension    Time spent:    Laressa Bolinger K  [facility] Pager [phone]. If 7PM-7AM, please contact night-coverage at [email or web address], password [email or web address] 06/02/2013, 10:46 AM  LOS: 1 day

## 2013-06-02 NOTE — Progress Notes (Signed)
BSE completed full report to follow.  Recommend regular/thin  Donavan Burnet, MS [facility] SLP [phone]

## 2013-06-02 NOTE — Progress Notes (Signed)
  Echocardiogram 2D Echocardiogram has been performed.  Thomas Nixon 06/02/2013, 12:11 PM

## 2013-06-02 NOTE — Progress Notes (Signed)
Stroke Team Progress Note  HISTORY Thomas Nixon is an 42 y.o. 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 He tells me he has had full workup in past but all the work-ups have been in different hospitals. These include, [facility], [facility], [facility] and [facility]. He does not recall having a TEE in the past nor does he recall being told he has a clotting deficiency. He has only been on Aggrenox in the past but wife feels like he was on Coumadin but was taken off this for some unknown reason--(she is not sure of this). [facility] due to having a stroke last Saturday while on a cruise. Symptoms consisted of left facial droop, dysarthria and left leg/arm numbness. On Sunday the ship made port in [place] and patient was brought to hospital where only MRI (per wife) was done. He did not want to stay there and came back to [facility] where he lives to go to hospital here (for both care and possibility of going to rehab). [facility] showed chronic hemorrhagic infarcts bilaterally and acute/subacute infarct in the posterior limb internal capsule and deep white matter in the right frontal parietal lobe.  Patient was not a TPA candidate secondary to delay in arrival. He was admitted for further evaluation and treatment.  SUBJECTIVE No family is at the bedside.  Overall he feels his condition is stable. Grandmother had sickle cell disease. He started having strokes at the age of 59 - no one has figured out why per him. He has not seen a neurologist in many years.  OBJECTIVE Most recent Vital Signs: Filed Vitals:   06/02/13 0047 06/02/13 0245 06/02/13 0455 06/02/13 0500  BP: 122/71 121/72 114/71 114/67  Pulse: 77 74 61 65  Temp: 98.2 F (36.8 C) 98.2 F (36.8 C) 98.1 F (36.7 C) 98 F (36.7 C)  TempSrc: Oral Oral Oral Oral  Resp: 18 18 16 18   Height:      Weight:      SpO2: 100% 99% 99% 98%   CBG (last 3)   Recent Labs  06/01/13 1307 06/01/13 2219  06/02/13 0704  GLUCAP 106* 133* 143*    IV Fluid Intake:     MEDICATIONS  . amLODipine  5 mg Oral Daily  . atorvastatin  40 mg Oral QHS  . clopidogrel  75 mg Oral Q breakfast  . insulin aspart  0-5 Units Subcutaneous QHS  . insulin aspart  0-9 Units Subcutaneous TID WC  . metFORMIN  1,000 mg Oral BID WC  . pregabalin  150 mg Oral BID   PRN:    Diet:  NPO  Activity:   Bathroom privileges with assistance DVT Prophylaxis:  None ordered  CLINICALLY SIGNIFICANT STUDIES Basic Metabolic Panel:  Recent Labs Lab 06/01/13 1423  NA 140  K 3.8  CL 103  CO2 24  GLUCOSE 97  BUN 29*  CREATININE 0.96  CALCIUM 9.3   Liver Function Tests:  Recent Labs Lab 06/01/13 1423  AST 26  ALT 23  ALKPHOS 35*  BILITOT 0.5  PROT 7.5  ALBUMIN 4.0   CBC:  Recent Labs Lab 06/01/13 1423  WBC 8.0  NEUTROABS 5.4  HGB 14.0  HCT 41.0  MCV 85.2  PLT 235   Coagulation:  Recent Labs Lab 06/01/13 1423  LABPROT 13.3  INR 1.03   Cardiac Enzymes:  Recent Labs Lab 06/01/13 1423  TROPONINI <0.30   Urinalysis:  Recent Labs Lab  06/01/13 1336  COLORURINE AMBER*  LABSPEC 1.034*  PHURINE 6.0  GLUCOSEU NEGATIVE  HGBUR NEGATIVE  BILIRUBINUR SMALL*  KETONESUR NEGATIVE  PROTEINUR NEGATIVE  UROBILINOGEN 1.0  NITRITE NEGATIVE  LEUKOCYTESUR NEGATIVE   Lipid Panel    Component Value Date/Time   CHOL 142 06/02/2013 0640   TRIG 81 06/02/2013 0640   HDL 34* 06/02/2013 0640   CHOLHDL 4.2 06/02/2013 0640   VLDL 16 06/02/2013 0640   LDLCALC 92 06/02/2013 0640   HgbA1C  Lab Results  Component Value Date   HGBA1C 6.3* 06/01/2013    Urine Drug Screen:     Component Value Date/Time   LABOPIA NONE DETECTED 06/02/2013 0257   COCAINSCRNUR NONE DETECTED 06/02/2013 0257   LABBENZ NONE DETECTED 06/02/2013 0257   AMPHETMU NONE DETECTED 06/02/2013 0257   THCU NONE DETECTED 06/02/2013 0257   LABBARB NONE DETECTED 06/02/2013 0257    Alcohol Level:  Recent Labs Lab 06/01/13 1423  ETH <11    CT  of the brain    MRI of the brain  06/01/2013   The extensive chronic ischemic changes for age consist with diabetes and hypertension.  Chronic hemorrhagic infarcts are present bilaterally.  Acute/subacute infarct in the posterior limb internal capsule and deep white matter in the right frontal parietal lobe.    MRA of the brain  06/01/2013    No significant intracranial stenosis.    2D Echocardiogram    Carotid Doppler    CXR  06/01/2013   No acute cardiopulmonary disease.  Elevation of the right hemidiaphragm.  EKG  normal sinus rhythm.   Therapy Recommendations CIR  Physical Exam   Pleasant  Young african [place] male in no distress.Awake alert. Afebrile. Head is nontraumatic. Neck is supple without bruit. Hearing is normal. Cardiac exam no murmur or gallop. Lungs are clear to auscultation. Distal pulses are well felt. Neurological Exam ; Awake alert oriented x 3  Dysarthric speech but normal language. Mild left lower face weakness.. Tongue midline. No drift. Left grip and hand weakness.LLE 4/5 weakness.Mild diminished fine finger movements on left. Orbits right over left upper extremity.  . Normal sensation . Normal coordination. ASSESSMENT Mr. Thomas Nixon is a 42 y.o. male presenting with left facial droop, dysarthria and left leg/arm numbness that occurred 1 wk ago while on a cruise ship.  Imaging confirms chronic hemorrhagic infarcts bilaterally and acute/subacute infarct in the posterior limb internal capsule and deep white matter in the right frontal parietal lobe. 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  On dipyridamole SR 250 mg/aspirin 25 mg orally twice a day prior to admission. Now on clopidogrel 75 mg orally every day for secondary stroke prevention. Patient with resultant left hemiparesis, dysarthria, dysphagia. Work up underway.   Hx multiple strokes, x 5, ? Etiology.  Hypertension Hyperlipidemia, LDL 92, on lipitor 40 PTA, now on lipitor 40,  goal LDL < 100 (< 70 for diabetics) Diabetes, HgbA1c 6.3, goal < 7.0 No hx migraines   Hospital day # 1  TREATMENT/PLAN  Continue clopidogrel 75 mg orally every day for secondary stroke prevention.  Add lovenox for VTE prophylaxis  Agree with plans for CIR  F/u 2D and carotid doppler  F/u hypercoag labs  Check ANA panel/vasculitc labs and sickle cell Schedule an outpatient TCD bubble study with emboli monitoring with Dr. Pearlean Brownie in 1 month to further evaluate for possible PFO. Have patient call for appointment. (I have added to discharge instruction sheet). Get notes if possible from  prior neuro visits vs IM records with Cain Saupe, MD   Annie Main, MSN, RN, ANVP-BC, ANP-BC, GNP-BC [facility] Pager: [phone] 06/02/2013 8:46 AM  I have personally obtained a history, examined the patient, evaluated imaging results, and formulated the assessment and plan of care. I agree with the above.   Delia Heady, MD

## 2013-06-02 NOTE — Evaluation (Signed)
Clinical/Bedside Swallow Evaluation Patient Details  Name: Thomas Nixon MRN: [id number] Date of Birth: 06-28-1971  Today's Date: 06/02/2013 Time: 1210-1227 SLP Time Calculation (min): 17 min  Past Medical History:  Past Medical History  Diagnosis Date  . Stroke     5 strokes  . Hypertension   . Diabetes mellitus without complication    Past Surgical History: History reviewed. No pertinent past surgical history. HPI:  Thomas Nixon is an 42 y.o. 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 He tells me he has had full workup in past but all the work-ups have been in different hospitals. These include, [facility], [facility] hospital, [facility] and [facility]. He does not recall having a TEE in the past nor does he recall being told he has a clotting deficiency. He has only been on Aggrenox in the past but wife feels like he was on Coumadin but was taken off this for some unknown reason--(she is not sure of this). [facility] due to having a stroke last Saturday while on a cruise. Symptoms consisted of left facial droop, dysarthria and left leg/arm numbness. On Sunday the ship made port in [place] and patient was brought to hospital where only MRI (per wife) was done. He did not want to stay there and came back to [facility] where he lives to go to hospital here (for both care and possibility of going to rehab). [facility] showed chronic hemorrhagic infarcts bilaterally and acute/subacute infarct in the posterior limb internal capsule and deep white matter in the right frontal parietal lobe.   Pt initially passed a stroke swallow screen but was noted to cough with liquids and bse was ordered.    Assessment / Plan / Recommendation Clinical Impression  Pt presents with clinical indications of mild oral dysphagia and suspected pharyngeal swallow intact.  Concern for episodic aspiration of thin liquids noted (overt cough x1 with water causing moderate amount of discomfort) -  Aspiration likely due to premature spillage into pharynx from decreased oral control/weakness/discoordination.  Lingual thrust noted with swallow, ? decr labial seal (left) and or decr lingual control.   When pt concentrates he decreased his coughing with liquids, chin tuck posture also helpful.   Mild amount of oral residuals with cracker/peanut butter noted, pt used applesauce to clear per SLP cue.  Rec regular/thin with precautions.  SLP to follow for tolerance, family education, readiness to dc strategies.  Pt and wife educated and agreeable.  Md please order speech and language evaluation if you agree as pt with mixeddysarthria, rapid rate of speech with imprecise articulation.  Pt's language skills appear to be intact.     Aspiration Risk  Mild    Diet Recommendation Regular;Thin liquid   Liquid Administration via: Cup;Straw Medication Administration: Whole meds with puree (follow with water) Supervision: Patient able to self feed Compensations: Slow rate;Small sips/bites Postural Changes and/or Swallow Maneuvers: Seated upright 90 degrees;Chin tuck    Other  Recommendations Oral Care Recommendations: Oral care BID   Follow Up Recommendations  Inpatient Rehab    Frequency and Duration min 1 x/week  2 weeks   Pertinent Vitals/Pain Afebrile, decreased    SLP Swallow Goals Patient will utilize recommended strategies during swallow to increase swallowing safety with: Modified independent assistance   Swallow Study Prior Functional Status   no dysphagia PTA even with previous CVAs    General HPI: Thomas Nixon is an 42 y.o. male who has had 4  previous strokes in the past. He tells NP he has had full workup in past but all the work-ups have been in different hospitals. These include, [facility], [facility], [facility] and [facility]. He does not recall having a TEE in the past nor does he recall being told he has a clotting deficiency. He has only been on Aggrenox in the past but wife  feels like he was on Coumadin but was taken off this for some unknown reason--(she is not sure of this). [facility] due to having a stroke last Saturday while on a cruise. Symptoms consisted of left facial droop, dysarthria and left leg/arm numbness. On Sunday the ship made port in [place] and patient was brought to hospital where only MRI (per wife) was done. He did not want to stay there and came back to [facility] where he lives to go to hospital here (for both care and possibility of going to rehab). [facility] showed chronic hemorrhagic infarcts bilaterally and acute/subacute infarct in the posterior limb internal capsule and deep white matter in the right frontal parietal lobe.   Pt initially passed a stroke swallow screen but was noted to cough with liquids and bse was ordered.  Type of Study: Bedside swallow evaluation Diet Prior to this Study: NPO Temperature Spikes Noted: No Respiratory Status: Room air History of Recent Intubation: No Behavior/Cognition: Alert;Cooperative;Pleasant mood Oral Cavity - Dentition: Adequate natural dentition Self-Feeding Abilities: Able to feed self Patient Positioning: Upright in bed Baseline Vocal Quality: Clear Volitional Cough: Strong Volitional Swallow: Able to elicit    Oral/Motor/Sensory Function Overall Oral Motor/Sensory Function: Impaired Labial ROM: Within Functional Limits Labial Symmetry: Within Functional Limits Labial Strength: Reduced Lingual ROM: Reduced left (tongue deviates to left upon protrusion) Lingual Strength: Reduced Facial ROM: Within Functional Limits Velum: Within Functional Limits   Ice Chips Ice chips: Not tested   Thin Liquid Thin Liquid: Impaired Presentation: Self Fed;Straw;Cup Oral Phase Impairments: Reduced lingual movement/coordination (lingual protrusion) Oral Phase Functional Implications: Prolonged oral transit Pharyngeal  Phase Impairments: Cough - Immediate Other Comments:  cough x1 when distracted by looking at self in mirror *per slp request*, chin tuck posture prevented cough, suspect premature spillage of liquid into larynx    Nectar Thick Nectar Thick Liquid: Impaired Presentation: Self Fed;Straw;Cup Oral Phase Impairments: Reduced lingual movement/coordination (lingual protrusion upon swallow) Oral phase functional implications: Prolonged oral transit   Honey Thick Honey Thick Liquid: Impaired Presentation: Cup;Self fed   Puree Puree: Impaired Presentation: Self Fed;Spoon Oral Phase Impairments: Reduced labial seal;Impaired anterior to posterior transit (lingual protrusion, ? decr labial seal left?) Oral Phase Functional Implications: Prolonged oral transit   Solid   GO    Solid: Impaired Presentation: Self Fed Oral Phase Impairments: Reduced lingual movement/coordination;Impaired anterior to posterior transit Oral Phase Functional Implications: Left lateral sulci pocketing;Right lateral sulci pocketing Other Comments: pt compensated by consuming applesauce to aid clearance      Donavan Burnet, MS [facility] SLP [phone]

## 2013-06-02 NOTE — Progress Notes (Signed)
   CARE MANAGEMENT ED NOTE 06/02/2013  Patient:  Thomas Nixon, Thomas Nixon   Account Number:  [id number]  Date Initiated:  06/01/2013  Documentation initiated by:  Fransico Michael  Subjective/Objective Assessment:   presented to ED with c/o weakness     Subjective/Objective Assessment Detail:     Action/Plan:   requesting admission straight to CIR   Action/Plan Detail:   Anticipated DC Date:       Status Recommendation to Physician:   Result of Recommendation:      DC Planning Services  CM consult    Choice offered to / List presented to:            Status of service:  Completed, signed off  ED Comments:   ED Comments Detail:  06/02/13-1427-J.Torrie Namba,RN,BSN [phone]      Notified Courtney,  4N CM of conversations from yesterday.  06/01/13-1543-J.Donnajean Chesnut,RN,BSN [phone]      Spoke with Marylu Lund, CSW regarding patient situation. Report given regarding potential SNF need.  06/01/13-1522-J.Pepe Mineau,RN,BSN [phone]      In to speak with patient, not in room. Spoke with wife. EDCM called Genie Logue with CIR who reported that patient's insurance [facility]) will not approve CIR but will approve SNF. Case worker at [facility] is [place] at [phone] [phone]. Will notify CSW.  06/01/13-1513-J.Tamia Dial,RN,BSN [phone]      Received referral from Dr. Fonnie Jarvis requesting assistance with patient being admitted to CIR. Reports that" patient was on a cruise ship when he had another stroke.Patient disembarked in [place], [place] and was admitted to hospital for acute CVA. The patient was told that he needed rehab, but wanted to come home to [facility]. He came straight here for rehab admission."

## 2013-06-02 NOTE — Progress Notes (Signed)
VASCULAR LAB PRELIMINARY  PRELIMINARY  PRELIMINARY  PRELIMINARY  Carotid duplex  completed.    Preliminary report:  Bilateral:  Less than 39% ICA stenosis.  Vertebral artery flow is antegrade.      Roshawna Colclasure, RVT 06/02/2013, 11:59 AM

## 2013-06-02 NOTE — Progress Notes (Signed)
UR COMPLETED  

## 2013-06-02 NOTE — Evaluation (Signed)
Physical Therapy Evaluation Patient Details Name: Thomas Nixon MRN: [id number] DOB: May 12, 1971 Today's Date: 06/02/2013 Time: 4098-1191 PT Time Calculation (min): 41 min  PT Assessment / Plan / Recommendation History of Present Illness  42 y/o adm. for stroke last Saturday while on a cruise. Symptoms consisted of left facial droop, dysarthria and left leg/arm numbness.  On Sunday the ship made port in [place] and patient was brought to hospital where only MRI (per wife) was done.  He did not want to stay there and came back to [facility] where he lives to go to hospital here (for both care and possibility of going to rehab). [facility] hemorrhagic infarcts bilaterally and acute/subacute infarct in the posterior limb internal capsule and deep white matter in the right frontal parietal lobe.    Clinical Impression  Presents to PT with below impairments (see PT problem list) impacting functional independence. Will benefit physical therapy in the acute setting to maximize strength and mobility so as to facilitate safe d/c to CIR and return to PLOF. Very motivated gentleman who is an excellent candidate for CIR.     PT Assessment  Patient needs continued PT services    Follow Up Recommendations  CIR    Does the patient have the potential to tolerate intense rehabilitation      Barriers to Discharge        Equipment Recommendations  None recommended by PT    Recommendations for Other Services     Frequency Min 4X/week    Precautions / Restrictions Precautions Precautions: Fall Restrictions Weight Bearing Restrictions: No   Pertinent Vitals/Pain Denies pain      Mobility  Bed Mobility Bed Mobility: Supine to Sit Supine to Sit: 4: Min assist;With rails Details for Bed Mobility Assistance: tactile and verbal sequencing cues to improve efficiency and for probelm solving Transfers Transfers: Sit to Stand;Stand to Sit;Stand Pivot Transfers Sit to Stand: 4:  Min assist;From bed Stand to Sit: 4: Min assist;To bed;3: Mod assist Stand Pivot Transfers: 1: +2 Total assist Stand Pivot Transfers: Patient Percentage: 50% Details for Transfer Assistance: sit<>stand x3, faciliation for midline and engagement of left side, needs at least minA to for improved eccentric control to chair; bilateral faciliation and v/c's for weight shift and stepping sequence during transfer, difficulty advancing LLE as well as maintaining adequate contraction of LLE during stance Ambulation/Gait Ambulation/Gait Assistance: 1: +2 Total assist Ambulation/Gait: Patient Percentage: 60% Ambulation Distance (Feet): 1 Feet Assistive device: None Ambulation/Gait Assistance Details: upward facilitation to elevate pelvis on the left during static standing and weight shift to the left during pre-gait activities, limited ability to maintain adequate contraction, strength fades and patient drifts to the left with limited control General Gait Details: pre-gait reciprocal steps fwd/bkwd standing EOB Modified Rankin (Stroke Patients Only) Pre-Morbid Rankin Score: No significant disability Modified Rankin: Severe disability    Exercises     PT Diagnosis: Difficulty walking;Abnormality of gait;Hemiplegia non-dominant side;Generalized weakness  PT Problem List: Decreased strength;Decreased activity tolerance;Decreased balance;Decreased mobility;Decreased coordination;Decreased knowledge of use of DME PT Treatment Interventions: DME instruction;Gait training;Functional mobility training;Therapeutic activities;Therapeutic exercise;Balance training;Neuromuscular re-education;Patient/family education     PT Goals(Current goals can be found in the care plan section) Acute Rehab PT Goals Patient Stated Goal: to walk, be independent PT Goal Formulation: With patient Time For Goal Achievement: 06/09/13 Potential to Achieve Goals: Good  Visit Information  Last PT Received On: 06/02/13 Assistance  Needed: +2 History of Present Illness: 42 y/o adm. for stroke last  Saturday while on a cruise. Symptoms consisted of left facial droop, dysarthria and left leg/arm numbness.  On Sunday the ship made port in [place] and patient was brought to hospital where only MRI (per wife) was done.  He did not want to stay there and came back to [facility] where he lives to go to hospital here (for both care and possibility of going to rehab). [facility] chronic hemorrhagic infarcts bilaterally and acute/subacute infarct in the posterior limb internal capsule and deep white matter in the right frontal parietal lobe.         Prior Functioning  Home Living Family/patient expects to be discharged to:: Private residence Living Arrangements: Spouse/significant other Available Help at Discharge: Family Type of Home:  (town home) Home Access: Stairs to enter Secretary/administrator of Steps: 5-6 Entrance Stairs-Rails: Right Home Layout: Two level Home Equipment: Environmental consultant - 2 wheels Additional Comments: newly weds married 2 weeks Prior Function Level of Independence: Independent Communication Communication: Expressive difficulties Dominant Hand: Right    Cognition  Cognition Arousal/Alertness: Awake/alert Behavior During Therapy: WFL for tasks assessed/performed Overall Cognitive Status: History of cognitive impairments - at baseline Memory: Decreased short-term memory    Extremity/Trunk Assessment Upper Extremity Assessment Upper Extremity Assessment: Defer to OT evaluation Lower Extremity Assessment Lower Extremity Assessment: LLE deficits/detail LLE Deficits / Details: good initial muscle contraction however limited ability to maintain during standinga activities Cervical / Trunk Assessment Cervical / Trunk Assessment: Other exceptions Cervical / Trunk Exceptions: hypotonic trunk with lateral flexion in sitting, left shoulder depressed   Balance Balance Balance Assessed: Yes Static  Standing Balance Static Standing - Balance Support: No upper extremity supported Static Standing - Level of Assistance: 4: Min assist;3: Mod assist Static Standing - Comment/# of Minutes: initially stands with minA however as he fatigues he requires increased cueing and assist to prevent drift to the left  End of Session PT - End of Session Equipment Utilized During Treatment: Gait belt Activity Tolerance: Patient tolerated treatment well Patient left: in bed;with family/visitor present Nurse Communication: Mobility status  GP     [facility] HELEN 06/02/2013, 9:39 AM

## 2013-06-02 NOTE — Consult Note (Signed)
Physical Medicine and Rehabilitation Consult Reason for Consult: CVA Referring Physician: [facility]   HPI: Thomas Nixon is a 42 y.o. right-handed male with history of hypertension, diabetes mellitus with peripheral neuropathy and multiple CVAs with residual left-sided weakness. Patient independent prior to admission living with his wife. By report patient has had full workup in the past for noted CVAs at outside hospitals including [facility], [facility], [facility] and [facility]. Patient on Aggrenox therapy prior to admission for CVA prophylaxis. Admitted 06/01/2013 with left facial droop which developed while patient was on a cruise in [place]. An MRI was completed at outside Hospital results not made available patient requested to come back to [facility]. MRI of the brain showed extensive chronic ischemic changes as well as acute/subacute infarct in the posterior limb internal capsule and deep white matter in the right frontal parietal lobe. MRA of the head negative for aneurysm no significant intracranial stenosis. Echocardiogram and carotid studies pending. Patient did not receive TPA. Neurology services consulted placed on Plavix for CVA prophylaxis. Physical and occupational therapy evaluations pending. M.D. is requested physical medicine rehabilitation consult to consider inpatient rehabilitation services Date of stroke was 05/27/2013. Prior functional status was independent with dressing bathing and mobility Review of Systems  Neurological: Positive for dizziness, tingling, weakness and headaches.  All other systems reviewed and are negative.   Past Medical History  Diagnosis Date  . Stroke     5 strokes  . Hypertension   . Diabetes mellitus without complication    History reviewed. No pertinent past surgical history. History reviewed. No pertinent family history. Social History:  reports that he has never smoked. He does not have any smokeless tobacco history on file. He reports that   drinks alcohol. He reports that he does not use illicit drugs. Allergies: No Known Allergies Medications Prior to Admission  Medication Sig Dispense Refill  . amLODipine (NORVASC) 5 MG tablet Take 5 mg by mouth daily.      Marland Kitchen atorvastatin (LIPITOR) 40 MG tablet Take 40 mg by mouth at bedtime.      . dipyridamole-aspirin (AGGRENOX) 200-25 MG per 12 hr capsule Take 1 capsule by mouth 2 (two) times daily.      . metFORMIN (GLUMETZA) 1000 MG (MOD) 24 hr tablet Take 1,000 mg by mouth 2 (two) times daily.      . pregabalin (LYRICA) 150 MG capsule Take 150 mg by mouth 2 (two) times daily.      Marland Kitchen spironolactone (ALDACTONE) 50 MG tablet Take 50 mg by mouth daily.        Home: Home Living Family/patient expects to be discharged to:: Private residence Living Arrangements: Spouse/significant other  Functional History:   Functional Status:  Mobility:          ADL:    Cognition: Cognition Orientation Level: Oriented X4    Blood pressure 114/67, pulse 65, temperature 98 F (36.7 C), temperature source Oral, resp. rate 18, height 5\' 10"  (1.778 m), weight 104.9 kg (231 lb 4.2 oz), SpO2 98.00%. Physical Exam  Vitals reviewed. Constitutional: He is oriented to person, place, and time.  HENT:  Head: Normocephalic.  Eyes: EOM are normal.  Neck: Normal range of motion. Neck supple. No thyromegaly present.  Cardiovascular: Normal rate and regular rhythm.   Pulmonary/Chest: Effort normal and breath sounds normal. No respiratory distress.  Abdominal: Soft. Bowel sounds are normal. He exhibits distension.  Neurological: He is alert and oriented to person, place, and time.  Patient follows three-step command  Skin: Skin is warm and dry.   motor strength 3+/5 in the left deltoid, bicep, tricep, grip, hip flexor, knee extensors, ankle dorsi flexion plantar flexor 5/5 in the right side Sensory exam is normal to light touch and pinprick in all 4 limbs. Speech is mildly dysarthric Visual fields are  intact confrontation testing Cerebellar testing shows ataxia finger nose to finger in the left upper   Results for orders placed during the hospital encounter of 06/01/13 (from the past 24 hour(s))  GLUCOSE, CAPILLARY     Status: Abnormal   Collection Time    06/01/13  1:07 PM      Result Value Range   Glucose-Capillary 106 (*) 70 - 99 mg/dL  URINE RAPID DRUG SCREEN (HOSP PERFORMED)     Status: None   Collection Time    06/01/13  1:36 PM      Result Value Range   Opiates NONE DETECTED  NONE DETECTED   Cocaine NONE DETECTED  NONE DETECTED   Benzodiazepines NONE DETECTED  NONE DETECTED   Amphetamines NONE DETECTED  NONE DETECTED   Tetrahydrocannabinol NONE DETECTED  NONE DETECTED   Barbiturates NONE DETECTED  NONE DETECTED  URINALYSIS, ROUTINE W REFLEX MICROSCOPIC     Status: Abnormal   Collection Time    06/01/13  1:36 PM      Result Value Range   Color, Urine AMBER (*) YELLOW   APPearance CLEAR  CLEAR   Specific Gravity, Urine 1.034 (*) 1.005 - 1.030   pH 6.0  5.0 - 8.0   Glucose, UA NEGATIVE  NEGATIVE mg/dL   Hgb urine dipstick NEGATIVE  NEGATIVE   Bilirubin Urine SMALL (*) NEGATIVE   Ketones, ur NEGATIVE  NEGATIVE mg/dL   Protein, ur NEGATIVE  NEGATIVE mg/dL   Urobilinogen, UA 1.0  0.0 - 1.0 mg/dL   Nitrite NEGATIVE  NEGATIVE   Leukocytes, UA NEGATIVE  NEGATIVE  ETHANOL     Status: None   Collection Time    06/01/13  2:23 PM      Result Value Range   Alcohol, Ethyl (B) <11  0 - 11 mg/dL  PROTIME-INR     Status: None   Collection Time    06/01/13  2:23 PM      Result Value Range   Prothrombin Time 13.3  11.6 - 15.2 seconds   INR 1.03  0.00 - 1.49  APTT     Status: None   Collection Time    06/01/13  2:23 PM      Result Value Range   aPTT 29  24 - 37 seconds  CBC     Status: None   Collection Time    06/01/13  2:23 PM      Result Value Range   WBC 8.0  4.0 - 10.5 K/uL   RBC 4.81  4.22 - 5.81 MIL/uL   Hemoglobin 14.0  13.0 - 17.0 g/dL   HCT 16.1  09.6 - 04.5 %    MCV 85.2  78.0 - 100.0 fL   MCH 29.1  26.0 - 34.0 pg   MCHC 34.1  30.0 - 36.0 g/dL   RDW 40.9  81.1 - 91.4 %   Platelets 235  150 - 400 K/uL  DIFFERENTIAL     Status: None   Collection Time    06/01/13  2:23 PM      Result Value Range   Neutrophils Relative % 68  43 - 77 %   Neutro Abs 5.4  1.7 -  7.7 K/uL   Lymphocytes Relative 24  12 - 46 %   Lymphs Abs 1.9  0.7 - 4.0 K/uL   Monocytes Relative 7  3 - 12 %   Monocytes Absolute 0.6  0.1 - 1.0 K/uL   Eosinophils Relative 1  0 - 5 %   Eosinophils Absolute 0.1  0.0 - 0.7 K/uL   Basophils Relative 0  0 - 1 %   Basophils Absolute 0.0  0.0 - 0.1 K/uL  COMPREHENSIVE METABOLIC PANEL     Status: Abnormal   Collection Time    06/01/13  2:23 PM      Result Value Range   Sodium 140  135 - 145 mEq/L   Potassium 3.8  3.5 - 5.1 mEq/L   Chloride 103  96 - 112 mEq/L   CO2 24  19 - 32 mEq/L   Glucose, Bld 97  70 - 99 mg/dL   BUN 29 (*) 6 - 23 mg/dL   Creatinine, Ser 6.57  0.50 - 1.35 mg/dL   Calcium 9.3  8.4 - 84.6 mg/dL   Total Protein 7.5  6.0 - 8.3 g/dL   Albumin 4.0  3.5 - 5.2 g/dL   AST 26  0 - 37 U/L   ALT 23  0 - 53 U/L   Alkaline Phosphatase 35 (*) 39 - 117 U/L   Total Bilirubin 0.5  0.3 - 1.2 mg/dL   GFR calc non Af Amer >90  >90 mL/min   GFR calc Af Amer >90  >90 mL/min  TROPONIN I     Status: None   Collection Time    06/01/13  2:23 PM      Result Value Range   Troponin I <0.30  <0.30 ng/mL  POCT I-STAT TROPONIN I     Status: None   Collection Time    06/01/13  2:45 PM      Result Value Range   Troponin i, poc 0.02  0.00 - 0.08 ng/mL   Comment 3           HEMOGLOBIN A1C     Status: Abnormal   Collection Time    06/01/13  5:33 PM      Result Value Range   Hemoglobin A1C 6.3 (*) <5.7 %   Mean Plasma Glucose 134 (*) <117 mg/dL  ANTITHROMBIN III     Status: None   Collection Time    06/01/13  5:33 PM      Result Value Range   AntiThromb III Func 107  75 - 120 %  HOMOCYSTEINE     Status: None   Collection Time     06/01/13  5:33 PM      Result Value Range   Homocysteine 6.8  4.0 - 15.4 umol/L  GLUCOSE, CAPILLARY     Status: Abnormal   Collection Time    06/01/13 10:19 PM      Result Value Range   Glucose-Capillary 133 (*) 70 - 99 mg/dL  URINE RAPID DRUG SCREEN (HOSP PERFORMED)     Status: None   Collection Time    06/02/13  2:57 AM      Result Value Range   Opiates NONE DETECTED  NONE DETECTED   Cocaine NONE DETECTED  NONE DETECTED   Benzodiazepines NONE DETECTED  NONE DETECTED   Amphetamines NONE DETECTED  NONE DETECTED   Tetrahydrocannabinol NONE DETECTED  NONE DETECTED   Barbiturates NONE DETECTED  NONE DETECTED  LIPID PANEL     Status: Abnormal  Collection Time    06/02/13  6:40 AM      Result Value Range   Cholesterol 142  0 - 200 mg/dL   Triglycerides 81  <132 mg/dL   HDL 34 (*) >44 mg/dL   Total CHOL/HDL Ratio 4.2     VLDL 16  0 - 40 mg/dL   LDL Cholesterol 92  0 - 99 mg/dL  GLUCOSE, CAPILLARY     Status: Abnormal   Collection Time    06/02/13  7:04 AM      Result Value Range   Glucose-Capillary 143 (*) 70 - 99 mg/dL   Comment 1 Documented in Chart     Comment 2 Notify RN     Mr Shirlee Latch Wo Contrast  06/01/2013   *RADIOLOGY REPORT*  Clinical Data:  Left-sided weakness.  Stroke.  Hypertension and diabetes  MRI HEAD WITHOUT CONTRAST MRA HEAD WITHOUT CONTRAST  Technique:  Multiplanar, multiecho pulse sequences of the brain and surrounding structures were obtained without intravenous contrast. Angiographic images of the head were obtained using MRA technique without contrast.  Comparison:  None  MRI HEAD  Findings:  There is considerable hyperintensity in the cerebral white matter bilaterally consistent with chronic microvascular ischemia.  There is a chronic hemorrhagic infarct in the right frontal parietal lobe.  There is a chronic hemorrhagic infarct in the left parietal white matter.  Diffusion weighted imaging shows hyperintensity in the posterior limb internal capsule on the  right extending into the cerebral white matter.  This appears to have restricted diffusion consistent with acute or  subacute infarct.  Ventricle size is normal.  No midline shift.  No neoplastic process is identified.  IMPRESSION: The extensive chronic ischemic changes for age consist with diabetes and hypertension.  Chronic hemorrhagic infarcts are present bilaterally.  Acute/subacute infarct in the posterior limb internal capsule and deep white matter in the right frontal parietal lobe.  MRA HEAD  Findings: Vertebral arteries are patent to the basilar without stenosis.  PICA, superior cerebellar, and posterior cerebral arteries are patent bilaterally.  Basilar is widely patent.  Cavernous carotid is widely patent bilaterally.  Anterior and middle cerebral arteries are patent bilaterally without significant stenosis.  Negative for cerebral aneurysm.  IMPRESSION: No significant intracranial stenosis.   Original Report Authenticated By: Janeece Riggers, M.D.   Mr Brain Wo Contrast  06/01/2013   *RADIOLOGY REPORT*  Clinical Data:  Left-sided weakness.  Stroke.  Hypertension and diabetes  MRI HEAD WITHOUT CONTRAST MRA HEAD WITHOUT CONTRAST  Technique:  Multiplanar, multiecho pulse sequences of the brain and surrounding structures were obtained without intravenous contrast. Angiographic images of the head were obtained using MRA technique without contrast.  Comparison:  None  MRI HEAD  Findings:  There is considerable hyperintensity in the cerebral white matter bilaterally consistent with chronic microvascular ischemia.  There is a chronic hemorrhagic infarct in the right frontal parietal lobe.  There is a chronic hemorrhagic infarct in the left parietal white matter.  Diffusion weighted imaging shows hyperintensity in the posterior limb internal capsule on the right extending into the cerebral white matter.  This appears to have restricted diffusion consistent with acute or  subacute infarct.  Ventricle size is normal.   No midline shift.  No neoplastic process is identified.  IMPRESSION: The extensive chronic ischemic changes for age consist with diabetes and hypertension.  Chronic hemorrhagic infarcts are present bilaterally.  Acute/subacute infarct in the posterior limb internal capsule and deep white matter in the right frontal parietal  lobe.  MRA HEAD  Findings: Vertebral arteries are patent to the basilar without stenosis.  PICA, superior cerebellar, and posterior cerebral arteries are patent bilaterally.  Basilar is widely patent.  Cavernous carotid is widely patent bilaterally.  Anterior and middle cerebral arteries are patent bilaterally without significant stenosis.  Negative for cerebral aneurysm.  IMPRESSION: No significant intracranial stenosis.   Original Report Authenticated By: Janeece Riggers, M.D.   Dg Chest Port 1 View  06/01/2013   *RADIOLOGY REPORT*  Clinical Data: Hypertension.  Right side crackles.  PORTABLE CHEST - 1 VIEW  Comparison: None.  Findings: Elevation of the right hemidiaphragm.  No confluent airspace opacities.  No effusions.  Heart is borderline in size. No acute bony abnormality.  IMPRESSION: No acute cardiopulmonary disease.  Elevation of the right hemidiaphragm.   Original Report Authenticated By: Charlett Nose, M.D.    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 Does the need for close, 24 hr/day medical supervision in concert with the patient's rehab needs make it unreasonable for this patient to be served in a less intensive setting? Yes 2. Co-Morbidities requiring supervision/potential complications: Prior CVA, hypertension 3. Due to bladder management, bowel management, safety, skin/wound care, disease management, medication administration and patient education, does the patient require 24 hr/day rehab nursing? Potentially 4. Does the patient require coordinated care of a physician, rehab nurse, PT (1-2 hrs/day, 5 days/week), OT  (1-2 hrs/day, 5 days/week) and SLP (0.5-1 hrs/day, 5 days/week) to address physical and functional deficits in the context of the above medical diagnosis(es)? Potentially Addressing deficits in the following areas: balance, endurance, locomotion, strength, transferring, bowel/bladder control, bathing, dressing, feeding, grooming, toileting and speech 5. Can the patient actively participate in an intensive therapy program of at least 3 hrs of therapy per day at least 5 days per week? Potentially 6. The potential for patient to make measurable gains while on inpatient rehab is good 7. Anticipated functional outcomes upon discharge from inpatient rehab are supervision mobility with PT, supervision ADLs with OT, 100% speech intelligibility with SLP. 8. Estimated rehab length of stay to reach the above functional goals is: 2 weeks 9. Does the patient have adequate social supports to accommodate these discharge functional goals? Yes 10. Anticipated D/C setting: Home 11. Anticipated post D/C treatments: Outpt therapy 12. Overall Rehab/Functional Prognosis: good  RECOMMENDATIONS: This patient's condition is appropriate for continued rehabilitative care in the following setting: CIR Patient has agreed to participate in recommended program. Yes Note that insurance prior authorization may be required for reimbursement for recommended care.  Comment: Needs to complete stroke workup first    06/02/2013

## 2013-06-02 NOTE — Evaluation (Signed)
Occupational Therapy Evaluation Patient Details Name: Thomas Nixon MRN: [id number] DOB: December 27, 1970 Today's Date: 06/02/2013 Time: 6213-0865 OT Time Calculation (min): 38 min  OT Assessment / Plan / Recommendation History of present illness 42 y/o adm. for stroke last Saturday while on a cruise. Symptoms consisted of left facial droop, dysarthria and left leg/arm numbness.  On Sunday the ship made port in [place] and patient was brought to hospital where only MRI (per wife) was done.  He did not want to stay there and came back to [facility] where he lives to go to hospital here (for both care and possibility of going to rehab). [facility] showed chronic hemorrhagic infarcts bilaterally and acute/subacute infarct in the posterior limb internal capsule and deep white matter in the right frontal parietal lobe.     Clinical Impression   PT admitted with Lt side deficits with previous admission in [place]. Pt now with new onset of LT UE deficits affecting all aspects of ADL and IADLS. Pt currently with functional limitiations due to the deficits listed below (see OT problem list). Pt needed (A) to don socks and PTA was independent with all ADLS/ IADLS Pt will benefit from skilled OT to increase their independence and safety with adls and balance to allow discharge CIR.     OT Assessment  Patient needs continued OT Services    Follow Up Recommendations  CIR    Barriers to Discharge      Equipment Recommendations  3 in 1 bedside comode    Recommendations for Other Services Rehab consult  Frequency  Min 3X/week    Precautions / Restrictions Precautions Precautions: Fall Restrictions Weight Bearing Restrictions: No   Pertinent Vitals/Pain None reported at this time    ADL  Eating/Feeding: Minimal assistance Where Assessed - Eating/Feeding: Chair Grooming: Teeth care;Minimal assistance Where Assessed - Grooming: Supported sitting Upper Body Dressing: Moderate assistance Where  Assessed - Upper Body Dressing: Supported sitting Lower Body Dressing: Maximal assistance (don socks) Where Assessed - Lower Body Dressing: Supported sitting (able to cross Rt LE but not LT LE) Toilet Transfer: +2 Total assistance Toilet Transfer: Patient Percentage: 50% Toilet Transfer Method: Stand pivot Toilet Transfer Equipment: Raised toilet seat with arms (or 3-in-1 over toilet) Equipment Used: Gait belt Transfers/Ambulation Related to ADLs: Pt demonstrates strong Lt Lean with static standing. pt needs verbal and tactile cues to help promote facilitation of lateral shift. Pt is able to initiate weight shift but unable to sustain.  ADL Comments: Pt pleasant and agreeable to session. pt with previous CVA and hx of working with therapy. Pt very motivated and continues to work hard. Pt demonstrates Lt UE deficits affecting all adls. Pt with lateral lean with static sitting. Pt positioned with oral care items. Pt asking questions about sequencing task. Pt given verbal cues and questioning cues to problem solve oral care. Pt with mild baseline STM deficits per patient but states its worse. Pt with speech changes.     OT Diagnosis: Generalized weakness;Cognitive deficits;Hemiplegia non-dominant side  OT Problem List: Decreased strength;Decreased range of motion;Decreased activity tolerance;Impaired balance (sitting and/or standing);Decreased coordination;Decreased cognition;Decreased safety awareness;Decreased knowledge of use of DME or AE;Decreased knowledge of precautions;Impaired sensation;Impaired UE functional use OT Treatment Interventions: Self-care/ADL training;Therapeutic exercise;Neuromuscular education;DME and/or AE instruction;Therapeutic activities;Cognitive remediation/compensation;Patient/family education;Balance training   OT Goals(Current goals can be found in the care plan section) Acute Rehab OT Goals Patient Stated Goal: to walk, be independent OT Goal Formulation: With  patient Time For Goal Achievement: 06/16/13 Potential  to Achieve Goals: Good ADL Goals Pt Will Perform Grooming: with set-up;sitting Pt Will Perform Lower Body Dressing: with min assist;with adaptive equipment;sitting/lateral leans Pt Will Transfer to Toilet: with mod assist;bedside commode Additional ADL Goal #1: Pt will use Lt UE to retrieve 2 out 3 ADL items during session   Visit Information  Last OT Received On: 06/02/13 Assistance Needed: +2 PT/OT Co-Evaluation/Treatment: Yes History of Present Illness: 42 y/o adm. for stroke last Saturday while on a cruise. Symptoms consisted of left facial droop, dysarthria and left leg/arm numbness.  On Sunday the ship made port in [place] and patient was brought to hospital where only MRI (per wife) was done.  He did not want to stay there and came back to [facility] where he lives to go to hospital here (for both care and possibility of going to rehab). [facility] showed chronic hemorrhagic infarcts bilaterally and acute/subacute infarct in the posterior limb internal capsule and deep white matter in the right frontal parietal lobe.         Prior Functioning     Home Living Family/patient expects to be discharged to:: Private residence Living Arrangements: Spouse/significant other Available Help at Discharge: Family Type of Home:  (town home) Home Access: Stairs to enter Secretary/administrator of Steps: 5-6 Entrance Stairs-Rails: Right Home Layout: Two level Home Equipment: Environmental consultant - 2 wheels Additional Comments: newly weds married 2 weeks Prior Function Level of Independence: Independent Communication Communication: Expressive difficulties Dominant Hand: Right         Vision/Perception Vision - History Baseline Vision: Wears glasses all the time Patient Visual Report:  (no glasses present at this time)   Cognition  Cognition Arousal/Alertness: Awake/alert Behavior During Therapy: WFL for tasks  assessed/performed Overall Cognitive Status: History of cognitive impairments - at baseline Memory: Decreased short-term memory    Extremity/Trunk Assessment Upper Extremity Assessment Upper Extremity Assessment: LUE deficits/detail LUE Deficits / Details: Pt demonstrates AROM shoulder 90 degrees holding for 30 seconds demonstrates fatigue with LT UE drift. Pt demonstrates shoulder AROM 3 out 5. hand grasp 3 out 5 gross grasp. Pt with proiception deficits LUE Sensation: decreased proprioception;decreased light touch LUE Coordination: decreased fine motor Lower Extremity Assessment Lower Extremity Assessment: Defer to PT evaluation LLE Deficits / Details: good initial muscle contraction however limited ability to maintain during standinga activities Cervical / Trunk Assessment Cervical / Trunk Assessment: Other exceptions Cervical / Trunk Exceptions: hypotonic trunk with lateral flexion in sitting, left shoulder depressed     Mobility Bed Mobility Bed Mobility: Supine to Sit Supine to Sit: 4: Min assist;With rails Details for Bed Mobility Assistance: Pt initially attempting to exit bed on the right and unsuccessful. Pt next attempting to exit bed on the left side. Pt pulling on rail with Lt UE with poor assist. Pt given vc by PT to use Rt UE with incr progression to EOB. Pt required incr time and a lot of effort physically. Pt needs repetition and education to make task more efficient Transfers Transfers: Sit to Stand;Stand to Sit Sit to Stand: 4: Min assist;From bed Stand to Sit: 4: Min assist;To bed;3: Mod assist Details for Transfer Assistance: sit<>stand x3, faciliation for midline and engagement of left side, needs at least minA to for improved eccentric control to chair; bilateral faciliation and v/c's for weight shift and stepping sequence during transfer, difficulty advancing LLE as well as maintaining adequate contraction of LLE during stance     Exercise     Balance  Balance Balance  Assessed: Yes Static Standing Balance Static Standing - Balance Support: No upper extremity supported Static Standing - Level of Assistance: 4: Min assist;3: Mod assist Static Standing - Comment/# of Minutes: initially stands with minA however as he fatigues he requires increased cueing and assist to prevent drift to the left   End of Session OT - End of Session Activity Tolerance: Patient tolerated treatment well Patient left: in bed;Other (comment) (transport to help patient to vascular lab) Nurse Communication: Mobility status;Precautions      Harrel Carina [facility] 06/02/2013, 9:57 AM Pager: [phone]

## 2013-06-03 LAB — GLUCOSE, CAPILLARY
Glucose-Capillary: 92 mg/dL (ref 70–99)
Glucose-Capillary: 128 mg/dL — ABNORMAL HIGH (ref 70–99)
Glucose-Capillary: 104 mg/dL — ABNORMAL HIGH (ref 70–99)
Glucose-Capillary: 107 mg/dL — ABNORMAL HIGH (ref 70–99)
Glucose-Capillary: 104 mg/dL — ABNORMAL HIGH (ref 70–99)

## 2013-06-03 LAB — C3 COMPLEMENT: C3 Complement: 153 mg/dL (ref 90–180)

## 2013-06-03 LAB — SICKLE CELL SCREEN: Sickle Cell Screen: NEGATIVE

## 2013-06-03 LAB — C4 COMPLEMENT: Complement C4, Body Fluid: 44 mg/dL — ABNORMAL HIGH (ref 10–40)

## 2013-06-03 MED ORDER — ENOXAPARIN SODIUM 60 MG/0.6ML ~~LOC~~ SOLN
50.0000 mg | SUBCUTANEOUS | Status: DC
  Administered 2013-06-03 – 2013-06-06 (×4): 50 mg via SUBCUTANEOUS
  Filled 2013-06-03 (×5): qty 0.6

## 2013-06-03 NOTE — Progress Notes (Signed)
[facility] PROGRESS NOTE  Thomas Nixon EXB:[id number] DOB: Aug 24, 1971 DOA: 06/01/2013 PCP: Cain Saupe, MD  Assessment/Plan: Recurrent CVA:  - Work up under way  - Neurology on board  - For now, cont with plavix  - f/u hypercoag panel  - PT/OT  - CIR consulted - awaiting input Monday DM:  - On metformin w/ SSI  HTN:  - SBP just over 100 - Cont hold bp meds to allow permissive htn DVT Prophylaxis: - Lovenox subQ  Code Status: Pending Family Communication: Pt in room (indicate person spoken with, relationship, and if by phone, the number) Disposition Plan: Pending poss CIR  Consultants:  Neurology  Procedures:  2D echo - 06/02/13 - 50-55% no cardiac emboli  Carotid dopplers 06/02/13 - unremarkable  HPI/Subjective: No acute events. No complaints  Objective: Filed Vitals:   06/02/13 1812 06/02/13 2156 06/03/13 0230 06/03/13 0537  BP: 124/70 115/99 103/72 103/62  Pulse: 73 69 55 67  Temp: 98.1 F (36.7 C) 97.6 F (36.4 C) 97.5 F (36.4 C) 98.4 F (36.9 C)  TempSrc: Oral Oral Axillary Oral  Resp: 18 20 20 20   Height:      Weight:      SpO2: 99% 100% 100% 99%    Intake/Output Summary (Last 24 hours) at 06/03/13 0815 Last data filed at 06/03/13 0750  Gross per 24 hour  Intake 2747.5 ml  Output    650 ml  Net 2097.5 ml   Filed Weights   06/01/13 2051  Weight: 104.9 kg (231 lb 4.2 oz)    Exam:   General:  Awake, in nad  Cardiovascular: regular, s1, s2  Respiratory: normal resp effort, no wheezing  Abdomen: soft, nondistended  Musculoskeletal: perfused, no clubbing   Data Reviewed: Basic Metabolic Panel:  Recent Labs Lab 06/01/13 1423  NA 140  K 3.8  CL 103  CO2 24  GLUCOSE 97  BUN 29*  CREATININE 0.96  CALCIUM 9.3   Liver Function Tests:  Recent Labs Lab 06/01/13 1423  AST 26  ALT 23  ALKPHOS 35*  BILITOT 0.5  PROT 7.5  ALBUMIN 4.0   No results found for this basename: LIPASE, AMYLASE,  in the last 168 hours No  results found for this basename: AMMONIA,  in the last 168 hours CBC:  Recent Labs Lab 06/01/13 1423  WBC 8.0  NEUTROABS 5.4  HGB 14.0  HCT 41.0  MCV 85.2  PLT 235   Cardiac Enzymes:  Recent Labs Lab 06/01/13 1423  TROPONINI <0.30   BNP (last 3 results) No results found for this basename: PROBNP,  in the last 8760 hours CBG:  Recent Labs Lab 06/02/13 0704 06/02/13 1131 06/02/13 1651 06/02/13 2158 06/03/13 0642  GLUCAP 143* 123* 113* 92 128*    No results found for this or any previous visit (from the past 240 hour(s)).   Studies: Mr Shirlee Latch NU Contrast  06/01/2013   *RADIOLOGY REPORT*  Clinical Data:  Left-sided weakness.  Stroke.  Hypertension and diabetes  MRI HEAD WITHOUT CONTRAST MRA HEAD WITHOUT CONTRAST  Technique:  Multiplanar, multiecho pulse sequences of the brain and surrounding structures were obtained without intravenous contrast. Angiographic images of the head were obtained using MRA technique without contrast.  Comparison:  None  MRI HEAD  Findings:  There is considerable hyperintensity in the cerebral white matter bilaterally consistent with chronic microvascular ischemia.  There is a chronic hemorrhagic infarct in the right frontal parietal lobe.  There is a chronic hemorrhagic infarct in the  left parietal white matter.  Diffusion weighted imaging shows hyperintensity in the posterior limb internal capsule on the right extending into the cerebral white matter.  This appears to have restricted diffusion consistent with acute or  subacute infarct.  Ventricle size is normal.  No midline shift.  No neoplastic process is identified.  IMPRESSION: The extensive chronic ischemic changes for age consist with diabetes and hypertension.  Chronic hemorrhagic infarcts are present bilaterally.  Acute/subacute infarct in the posterior limb internal capsule and deep white matter in the right frontal parietal lobe.  MRA HEAD  Findings: Vertebral arteries are patent to the  basilar without stenosis.  PICA, superior cerebellar, and posterior cerebral arteries are patent bilaterally.  Basilar is widely patent.  Cavernous carotid is widely patent bilaterally.  Anterior and middle cerebral arteries are patent bilaterally without significant stenosis.  Negative for cerebral aneurysm.  IMPRESSION: No significant intracranial stenosis.   Original Report Authenticated By: Janeece Riggers, M.D.   Mr Brain Wo Contrast  06/01/2013   *RADIOLOGY REPORT*  Clinical Data:  Left-sided weakness.  Stroke.  Hypertension and diabetes  MRI HEAD WITHOUT CONTRAST MRA HEAD WITHOUT CONTRAST  Technique:  Multiplanar, multiecho pulse sequences of the brain and surrounding structures were obtained without intravenous contrast. Angiographic images of the head were obtained using MRA technique without contrast.  Comparison:  None  MRI HEAD  Findings:  There is considerable hyperintensity in the cerebral white matter bilaterally consistent with chronic microvascular ischemia.  There is a chronic hemorrhagic infarct in the right frontal parietal lobe.  There is a chronic hemorrhagic infarct in the left parietal white matter.  Diffusion weighted imaging shows hyperintensity in the posterior limb internal capsule on the right extending into the cerebral white matter.  This appears to have restricted diffusion consistent with acute or  subacute infarct.  Ventricle size is normal.  No midline shift.  No neoplastic process is identified.  IMPRESSION: The extensive chronic ischemic changes for age consist with diabetes and hypertension.  Chronic hemorrhagic infarcts are present bilaterally.  Acute/subacute infarct in the posterior limb internal capsule and deep white matter in the right frontal parietal lobe.  MRA HEAD  Findings: Vertebral arteries are patent to the basilar without stenosis.  PICA, superior cerebellar, and posterior cerebral arteries are patent bilaterally.  Basilar is widely patent.  Cavernous carotid is  widely patent bilaterally.  Anterior and middle cerebral arteries are patent bilaterally without significant stenosis.  Negative for cerebral aneurysm.  IMPRESSION: No significant intracranial stenosis.   Original Report Authenticated By: Janeece Riggers, M.D.   Dg Chest Port 1 View  06/01/2013   *RADIOLOGY REPORT*  Clinical Data: Hypertension.  Right side crackles.  PORTABLE CHEST - 1 VIEW  Comparison: None.  Findings: Elevation of the right hemidiaphragm.  No confluent airspace opacities.  No effusions.  Heart is borderline in size. No acute bony abnormality.  IMPRESSION: No acute cardiopulmonary disease.  Elevation of the right hemidiaphragm.   Original Report Authenticated By: Charlett Nose, M.D.    Scheduled Meds: . atorvastatin  40 mg Oral QHS  . clopidogrel  75 mg Oral Q breakfast  . insulin aspart  0-5 Units Subcutaneous QHS  . insulin aspart  0-9 Units Subcutaneous TID WC  . metFORMIN  1,000 mg Oral BID WC  . pregabalin  150 mg Oral BID   Continuous Infusions: . sodium chloride 75 mL/hr at 06/02/13 2358    Principal Problem:   Stroke Active Problems:   Dysarthria   Hypertension  Time spent:    Francoise Chojnowski K  [facility] Pager [phone]. If 7PM-7AM, please contact night-coverage at [email or web address], password [facility] 06/03/2013, 8:15 AM  LOS: 2 days

## 2013-06-03 NOTE — Progress Notes (Signed)
ANTICOAGULATION CONSULT NOTE - Initial Consult  Pharmacy Consult for lovenox Indication: VTE prophylaxis  No Known Allergies  Patient Measurements: Height: 5\' 10"  (177.8 cm) Weight: 231 lb 4.2 oz (104.9 kg) IBW/kg (Calculated) : 73   Vital Signs: Temp: 98.4 F (36.9 C) (07/12 0537) Temp src: Oral (07/12 0537) BP: 103/62 mmHg (07/12 0537) Pulse Rate: 67 (07/12 0537)  Labs:  Recent Labs  06/01/13 1423  HGB 14.0  HCT 41.0  PLT 235  APTT 29  LABPROT 13.3  INR 1.03  CREATININE 0.96  TROPONINI <0.30    Estimated Creatinine Clearance: 125.4 ml/min (by C-G formula based on Cr of 0.96).   Medical History: Past Medical History  Diagnosis Date  . Stroke     5 strokes  . Hypertension   . Diabetes mellitus without complication     Medications:  Prescriptions prior to admission  Medication Sig Dispense Refill  . amLODipine (NORVASC) 5 MG tablet Take 5 mg by mouth daily.      Marland Kitchen atorvastatin (LIPITOR) 40 MG tablet Take 40 mg by mouth at bedtime.      . dipyridamole-aspirin (AGGRENOX) 200-25 MG per 12 hr capsule Take 1 capsule by mouth 2 (two) times daily.      . metFORMIN (GLUMETZA) 1000 MG (MOD) 24 hr tablet Take 1,000 mg by mouth 2 (two) times daily.      . pregabalin (LYRICA) 150 MG capsule Take 150 mg by mouth 2 (two) times daily.      Marland Kitchen spironolactone (ALDACTONE) 50 MG tablet Take 50 mg by mouth daily.        Assessment: 42 yo with h/o multiple CVAs to start lovenox for VTE px.  His CrCl >100, weight 104.9 kg. Goal of Therapy:  Anti Xa level 0.3-0.6 units/ml Monitor platelets by anticoagulation protocol: Yes   Plan:  Lovenox 50 mg sq q24 hours.   Check CBC q 3 days while on lovenox.  Talbert Cage [place] 06/03/2013,8:23 AM

## 2013-06-03 NOTE — Progress Notes (Signed)
Occupational Therapy Treatment Patient Details Name: Thomas Nixon MRN: [id number] DOB: [birth year] Today's Date: 06/03/2013 Time: 4098-1191 OT Time Calculation (min): 25 min  OT Assessment / Plan / Recommendation  OT comments  PT admitted with left UE/LE weakness and slurred speech. Pt currently with functional limitiations due to the deficits listed below (see OT problem list). Pt with fine motor deficits in left UE and difficulty with reaching for objects. Pt's left ue deficits affect all adls at this time. Pt demonstrates balance deficits compared to baseline ambulation with no DME. Pt will benefit from skilled OT to increase their independence and safety with adls and balance to allow discharge CIR.   Follow Up Recommendations  CIR    Barriers to Discharge       Equipment Recommendations  3 in 1 bedside comode    Recommendations for Other Services Rehab consult  Frequency Min 3X/week   Progress towards OT Goals Progress towards OT goals: Progressing toward goals  Plan Discharge plan remains appropriate    Precautions / Restrictions Precautions Precautions: Fall Restrictions Weight Bearing Restrictions: No   Pertinent Vitals/Pain None reported at this time    ADL  Eating/Feeding: Set up Where Assessed - Eating/Feeding: Chair Grooming: Wash/dry face;Teeth care;Wash/dry hands;Set up Where Assessed - Grooming: Supported sitting Upper Body Bathing: Chest;Right arm;Left arm;Abdomen;Minimal assistance Where Assessed - Upper Body Bathing: Supported sitting Upper Body Dressing: Minimal assistance Where Assessed - Upper Body Dressing: Supported sitting Lower Body Dressing: Moderate assistance Where Assessed - Lower Body Dressing: Supported sit to stand Toilet Transfer: Minimal assistance Toilet Transfer: Patient Percentage: 50% Equipment Used: Gait belt;Rolling walker Transfers/Ambulation Related to ADLs: Pt ambulating with Lt hand loose grasp RW and left foot lag. Pt needed  cues for weight shift and decr foot drag of toes.  ADL Comments: Pt recalling therapist and purpose of therapy arriving. pt voiding on bed pan with tech on arrival due to urgency. Pt educated the need to use 3n1 to help with transfers. Pt agreeable. Pt ambulated to restroom with left foot drag with RW. pt sitting during ADL to decr LE fatigue during prolonged standing. pt sitting for ADLs. Pt dropping wash cloth in left hand several times. Pt needed Rt hand to reposition in left hand. Pt using a hook like grasp to maintain wash cloth  in palm. Pt dropping tooth brush on floor attempting to place object in left hand. pt holding tooth brush in left hand and looked away from hand to counter surface and due to no visual attention dropped tooth brush. Pt with incr ability to express needs verbally today compared to 06/02/13. Pt likes to joke and laughs often throughout session. pt declined LB bathing standing my wife can help me.    OT Diagnosis:    OT Problem List:   OT Treatment Interventions:     OT Goals(current goals can now be found in the care plan section) Acute Rehab OT Goals Patient Stated Goal: to walk, be independent OT Goal Formulation: With patient Time For Goal Achievement: 06/16/13 Potential to Achieve Goals: Good ADL Goals Pt Will Perform Grooming: with min assist;standing (updated goal - now standing required) Pt Will Perform Lower Body Dressing: with min assist;with adaptive equipment;sitting/lateral leans Pt Will Transfer to Toilet: with min assist;bedside commode (updated goal) Additional ADL Goal #1: Pt will use Lt UE to retrieve 2 out 3 ADL items during session   Visit Information  Last OT Received On: 06/03/13 Assistance Needed: +2 PT/OT Co-Evaluation/Treatment: Yes History of Present Illness:  42 y/o adm. for stroke last Saturday while on a cruise. Symptoms consisted of left facial droop, dysarthria and left leg/arm numbness.  On Sunday the ship made port in [place] and  patient was brought to hospital where only MRI (per wife) was done.  He did not want to stay there and came back to [facility] where he lives to go to hospital here (for both care and possibility of going to rehab). [facility] showed chronic hemorrhagic infarcts bilaterally and acute/subacute infarct in the posterior limb internal capsule and deep white matter in the right frontal parietal lobe.      Subjective Data      Prior Functioning       Cognition  Cognition Arousal/Alertness: Awake/alert Behavior During Therapy: WFL for tasks assessed/performed Overall Cognitive Status: History of cognitive impairments - at baseline Memory: Decreased short-term memory    Mobility  Bed Mobility Bed Mobility: Supine to Sit Supine to Sit: 4: Min assist;With rails (incr effort and multiple attempts, pulling with rt UE) Details for Bed Mobility Assistance: Pt with Rt UE pulling on rail. pt using abdomen to pull into sitting with LOB posteriorly x3 before final 4th successful attempt. Pt using Rt side of body only to progress to edge of bed Transfers Transfers: Sit to Stand;Stand to Sit Sit to Stand: 4: Min assist;With upper extremity assist;From bed Stand to Sit: 4: Min assist;With upper extremity assist;To chair/3-in-1 Details for Transfer Assistance: pt with v/c to use bil ue to control descend to chair. pt with incr midline standing this session. pt demonstrate incr body awareness with repetition with therapy. PT remains excellent cir candidate. Pt needs repetition!    Exercises      Balance     End of Session OT - End of Session Activity Tolerance: Patient tolerated treatment well Patient left: in chair;with call bell/phone within reach Nurse Communication: Mobility status;Precautions  GO     Harrel Carina [facility] 06/03/2013, 9:30 AM  Pager: [phone]

## 2013-06-03 NOTE — Progress Notes (Signed)
Stroke Team Progress Note  HISTORY Thomas Nixon is an 42 y.o. 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 He reported that he has had full workup in past but all the work-ups have been in different hospitals. These include, [facility], [facility], [facility] and [facility]. He does not recall having a TEE in the past nor does he recall being told he has a clotting deficiency. He has only been on Aggrenox in the past but wife feelt like he was on Coumadin but was taken off this for some unknown reason--(she was not sure of this). [facility] for this event due to having a stroke last Saturday while on a cruise. Symptoms consisted of left facial droop, dysarthria and left leg/arm numbness. On Sunday the ship made port in [place] and patient was brought to hospital where only MRI (per wife) was done. He did not want to stay there and came back to [facility] where he lives to go to hospital here (for both care and possibility of going to rehab). [facility] obtained at [facility] showed chronic hemorrhagic infarcts bilaterally and acute/subacute infarct in the posterior limb internal capsule and deep white matter in the right frontal parietal lobe.  Patient was not a TPA candidate secondary to delay in arrival. He was admitted for further evaluation and treatment.  SUBJECTIVE No family members present this morning. The patient is currently participating in therapy. He has no complaints.  OBJECTIVE Most recent Vital Signs: Filed Vitals:   06/02/13 1812 06/02/13 2156 06/03/13 0230 06/03/13 0537  BP: 124/70 115/99 103/72 103/62  Pulse: 73 69 55 67  Temp: 98.1 F (36.7 C) 97.6 F (36.4 C) 97.5 F (36.4 C) 98.4 F (36.9 C)  TempSrc: Oral Oral Axillary Oral  Resp: 18 20 20 20   Height:      Weight:      SpO2: 99% 100% 100% 99%   CBG (last 3)   Recent Labs  06/02/13 1651 06/02/13 2158 06/03/13 0642  GLUCAP 113* 92 128*    IV Fluid Intake:   . sodium chloride 75 mL/hr at  06/02/13 2358    MEDICATIONS  . atorvastatin  40 mg Oral QHS  . clopidogrel  75 mg Oral Q breakfast  . insulin aspart  0-5 Units Subcutaneous QHS  . insulin aspart  0-9 Units Subcutaneous TID WC  . metFORMIN  1,000 mg Oral BID WC  . pregabalin  150 mg Oral BID   PRN:    Diet:  Carb Control  Activity:   Bathroom privileges with assistance DVT Prophylaxis:  Lovenox started today  CLINICALLY SIGNIFICANT STUDIES Basic Metabolic Panel:   Recent Labs Lab 06/01/13 1423  NA 140  K 3.8  CL 103  CO2 24  GLUCOSE 97  BUN 29*  CREATININE 0.96  CALCIUM 9.3   Liver Function Tests:   Recent Labs Lab 06/01/13 1423  AST 26  ALT 23  ALKPHOS 35*  BILITOT 0.5  PROT 7.5  ALBUMIN 4.0   CBC:   Recent Labs Lab 06/01/13 1423  WBC 8.0  NEUTROABS 5.4  HGB 14.0  HCT 41.0  MCV 85.2  PLT 235   Coagulation:   Recent Labs Lab 06/01/13 1423  LABPROT 13.3  INR 1.03   Cardiac Enzymes:   Recent Labs Lab 06/01/13 1423  TROPONINI <0.30   Urinalysis:   Recent Labs Lab 06/01/13 1336  COLORURINE AMBER*  LABSPEC 1.034*  PHURINE 6.0  GLUCOSEU NEGATIVE  HGBUR NEGATIVE  BILIRUBINUR SMALL*  KETONESUR NEGATIVE  PROTEINUR NEGATIVE  UROBILINOGEN 1.0  NITRITE NEGATIVE  LEUKOCYTESUR NEGATIVE   Lipid Panel    Component Value Date/Time   CHOL 142 06/02/2013 0640   TRIG 81 06/02/2013 0640   HDL 34* 06/02/2013 0640   CHOLHDL 4.2 06/02/2013 0640   VLDL 16 06/02/2013 0640   LDLCALC 92 06/02/2013 0640   HgbA1C  Lab Results  Component Value Date   HGBA1C 6.2* 06/01/2013    Urine Drug Screen:     Component Value Date/Time   LABOPIA NONE DETECTED 06/02/2013 0257   COCAINSCRNUR NONE DETECTED 06/02/2013 0257   LABBENZ NONE DETECTED 06/02/2013 0257   AMPHETMU NONE DETECTED 06/02/2013 0257   THCU NONE DETECTED 06/02/2013 0257   LABBARB NONE DETECTED 06/02/2013 0257    Alcohol Level:   Recent Labs Lab 06/01/13 1423  ETH <11    CT of the brain    MRI of the brain  06/01/2013    The extensive chronic ischemic changes for age consist with diabetes and hypertension.  Chronic hemorrhagic infarcts are present bilaterally.  Acute/subacute infarct in the posterior limb internal capsule and deep white matter in the right frontal parietal lobe.    MRA of the brain  06/01/2013    No significant intracranial stenosis.    2D Echocardiogram  ejection fraction 50-55%. A cardiac source of emboli identified.   Carotid Doppler  Preliminary report: Bilateral: Less than 39% ICA stenosis. Vertebral artery flow is antegrade.    CXR  06/01/2013   No acute cardiopulmonary disease.  Elevation of the right hemidiaphragm.  EKG  normal sinus rhythm.   Therapy Recommendations CIR  Physical Exam   Pleasant  Young african [place] male in no distress.Awake alert. Afebrile. Head is nontraumatic. Neck is supple without bruit. Hearing is normal. Cardiac exam no murmur or gallop. Lungs are clear to auscultation. Distal pulses are well felt. Neurological Exam ; Awake alert oriented x 3  Dysarthric speech but normal language. Mild left lower face weakness.. Tongue midline. No drift. Left grip and hand weakness.LLE 4/5 weakness.Mild diminished fine finger movements on left. Orbits right over left upper extremity.  Mildly decreased sensation in both the left upper and left lower extremities. Normal coordination. Difficulty with finger to nose on the left secondary to weakness.   ASSESSMENT Mr. Thomas Nixon is a 43 y.o. male presenting with left facial droop, dysarthria and left leg/arm numbness that occurred 1 wk ago while on a cruise ship.  Imaging confirms chronic hemorrhagic infarcts bilaterally and acute/subacute infarct in the posterior limb internal capsule and deep white matter in the right frontal parietal lobe. 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  On dipyridamole SR 250 mg/aspirin 25 mg orally twice a day prior to admission. Now on clopidogrel 75 mg orally every  day for secondary stroke prevention. Patient with resultant left hemiparesis, dysarthria, dysphagia. Work up underway.   Hx multiple strokes, x 5, ? Etiology.  Hypertension Hyperlipidemia, LDL 92, on lipitor 40 PTA, now on lipitor 40, goal LDL < 100 (< 70 for diabetics) Diabetes, HgbA1c 6.3, goal < 7.0 No hx migraines   Hospital day # 2  TREATMENT/PLAN  Continue clopidogrel 75 mg orally every day for secondary stroke prevention.  Add lovenox for VTE prophylaxis  Agree with plans for CIR  F/u 2D and carotid doppler as above  F/u hypercoag labs  Check ANA pending, sedimentation rate 11, RPR nonreactive, and sickle cell pending. Schedule an outpatient TCD  bubble study with emboli monitoring with Dr. Pearlean Brownie in 1 month to further evaluate for possible PFO. Have patient call for appointment. (I have added to discharge instruction sheet). Get notes if possible from prior neuro visits vs IM records with Cain Saupe, MD Will sign off- Dr. Pearlean Brownie needs to see following discharge.   Delton See PA-C [facility] Pager [phone] 06/03/2013, 8:22 AM  I have personally obtained a history, examined the patient, evaluated imaging results, and formulated the assessment and plan of care. I agree with the above.  Lesly Dukes

## 2013-06-04 DIAGNOSIS — R471 Dysarthria and anarthria: Secondary | ICD-10-CM

## 2013-06-04 DIAGNOSIS — I6350 Cerebral infarction due to unspecified occlusion or stenosis of unspecified cerebral artery: Secondary | ICD-10-CM

## 2013-06-04 DIAGNOSIS — I6330 Cerebral infarction due to thrombosis of unspecified cerebral artery: Secondary | ICD-10-CM

## 2013-06-04 DIAGNOSIS — I10 Essential (primary) hypertension: Secondary | ICD-10-CM

## 2013-06-04 LAB — GLUCOSE, CAPILLARY
Glucose-Capillary: 116 mg/dL — ABNORMAL HIGH (ref 70–99)
Glucose-Capillary: 100 mg/dL — ABNORMAL HIGH (ref 70–99)
Glucose-Capillary: 90 mg/dL (ref 70–99)

## 2013-06-04 NOTE — Progress Notes (Signed)
[facility] PROGRESS NOTE  Thomas Nixon ZOX:[id number] DOB: Mar 17, 1971 DOA: 06/01/2013 PCP: Cain Saupe, MD  Assessment/Plan: Recurrent CVA:  - Neurology on board  - For now, cont with plavix  - f/u hypercoag panel  - PT/OT  - CIR consulted - awaiting input Monday  DM:  - On metformin w/ SSI  HTN:  - SBP just over 100  - Cont hold bp meds to allow permissive htn  DVT Prophylaxis:  - Lovenox subQ  Code Status: Full Family Communication: Pt in room (indicate person spoken with, relationship, and if by phone, the number) Disposition Plan: Pending   Consultants:  Neurology  CIR  Procedures: 2D echo - 06/02/13 - 50-55% no cardiac emboli  Carotid dopplers 06/02/13 - unremarkable   HPI/Subjective: No complaints.   Objective: Filed Vitals:   06/03/13 1850 06/03/13 2200 06/04/13 0154 06/04/13 0607  BP: 126/79 119/73 115/65 118/69  Pulse: 57 53 55 57  Temp: 99.1 F (37.3 C) 98.5 F (36.9 C) 98.1 F (36.7 C) 98.8 F (37.1 C)  TempSrc: Oral     Resp: 20 18 18 18   Height:      Weight:      SpO2: 100% 100% 100% 100%    Intake/Output Summary (Last 24 hours) at 06/04/13 0844 Last data filed at 06/03/13 1855  Gross per 24 hour  Intake      0 ml  Output      2 ml  Net     -2 ml   Filed Weights   06/01/13 2051  Weight: 104.9 kg (231 lb 4.2 oz)    Exam:   General:  Awake, in nad  Cardiovascular: regular, s1, s2  Respiratory: normal resp effort, no wheezing  Abdomen: soft, nondistended  Musculoskeletal: perfused, no clubbing   Data Reviewed: Basic Metabolic Panel:  Recent Labs Lab 06/01/13 1423  NA 140  K 3.8  CL 103  CO2 24  GLUCOSE 97  BUN 29*  CREATININE 0.96  CALCIUM 9.3   Liver Function Tests:  Recent Labs Lab 06/01/13 1423  AST 26  ALT 23  ALKPHOS 35*  BILITOT 0.5  PROT 7.5  ALBUMIN 4.0   No results found for this basename: LIPASE, AMYLASE,  in the last 168 hours No results found for this basename: AMMONIA,  in the last  168 hours CBC:  Recent Labs Lab 06/01/13 1423  WBC 8.0  NEUTROABS 5.4  HGB 14.0  HCT 41.0  MCV 85.2  PLT 235   Cardiac Enzymes:  Recent Labs Lab 06/01/13 1423  TROPONINI <0.30   BNP (last 3 results) No results found for this basename: PROBNP,  in the last 8760 hours CBG:  Recent Labs Lab 06/03/13 0642 06/03/13 1155 06/03/13 1652 06/03/13 2136 06/04/13 0648  GLUCAP 128* 104* 107* 104* 116*    No results found for this or any previous visit (from the past 240 hour(s)).   Studies: No results found.  Scheduled Meds: . atorvastatin  40 mg Oral QHS  . clopidogrel  75 mg Oral Q breakfast  . enoxaparin (LOVENOX) injection  50 mg Subcutaneous Q24H  . insulin aspart  0-5 Units Subcutaneous QHS  . insulin aspart  0-9 Units Subcutaneous TID WC  . metFORMIN  1,000 mg Oral BID WC  . pregabalin  150 mg Oral BID   Continuous Infusions: . sodium chloride 75 mL/hr at 06/02/13 2358    Principal Problem:   Stroke Active Problems:   Dysarthria   Hypertension    Time spent:     Cristella Stiver K  [facility] Pager [phone]. If 7PM-7AM, please contact night-coverage at [email or web address], password [facility] 06/04/2013, 8:44 AM  LOS: 3 days

## 2013-06-04 NOTE — Plan of Care (Signed)
Problem: Phase I Progression Outcomes Goal: Stroke Team notified of admission Outcome: Not Applicable Date Met:  06/04/13 Outside of window for TPA

## 2013-06-05 LAB — GLUCOSE, CAPILLARY
Glucose-Capillary: 115 mg/dL — ABNORMAL HIGH (ref 70–99)
Glucose-Capillary: 116 mg/dL — ABNORMAL HIGH (ref 70–99)
Glucose-Capillary: 104 mg/dL — ABNORMAL HIGH (ref 70–99)
Glucose-Capillary: 134 mg/dL — ABNORMAL HIGH (ref 70–99)

## 2013-06-05 LAB — COMPLEMENT, TOTAL: Compl, Total (CH50): 58 U/mL (ref 31–60)

## 2013-06-05 LAB — ANA: Anti Nuclear Antibody(ANA): NEGATIVE

## 2013-06-05 NOTE — Progress Notes (Signed)
[facility] PROGRESS NOTE  Thomas Nixon WUJ:[id number] DOB: 07/22/1971 DOA: 06/01/2013 PCP: Cain Saupe, MD  Assessment/Plan: Recurrent CVA:  - Neurology on board  - For now, cont with plavix  - f/u hypercoag panel - thus far unremarkable - PT/OT  - CIR consulted - awaiting input Monday  DM:  - On metformin w/ SSI  HTN:  - SBP just over 100  - Cont hold bp meds to allow permissive htn  DVT Prophylaxis:  - Lovenox subQ  Code Status: Full Family Communication: Pt in room (indicate person spoken with, relationship, and if by phone, the number) Disposition Plan: Pending ?CIR  Consultants:  Neurology  CIR  Procedures: 2D echo - 06/02/13 - 50-55% no cardiac emboli  Carotid dopplers 06/02/13 - unremarkable  HPI/Subjective: No acute events noted overnight. Pt feels well  Objective: Filed Vitals:   06/04/13 1848 06/04/13 2200 06/05/13 0200 06/05/13 0600  BP: 116/75 118/59 129/75 114/78  Pulse: 59 51 50 55  Temp: 98.2 F (36.8 C) 98.4 F (36.9 C) 98.6 F (37 C) 98.7 F (37.1 C)  TempSrc: Oral     Resp: 20 18 18 18   Height:      Weight:      SpO2: 98% 99% 98% 100%    Intake/Output Summary (Last 24 hours) at 06/05/13 0809 Last data filed at 06/05/13 0700  Gross per 24 hour  Intake    600 ml  Output    600 ml  Net      0 ml   Filed Weights   06/01/13 2051  Weight: 104.9 kg (231 lb 4.2 oz)    Exam:   General:  Awake, in nad  Cardiovascular: regular, s1, s2  Respiratory: normal resp effort, no wheezing  Abdomen: soft, nondistended  Musculoskeletal: perfused, no clubbing   Data Reviewed: Basic Metabolic Panel:  Recent Labs Lab 06/01/13 1423  NA 140  K 3.8  CL 103  CO2 24  GLUCOSE 97  BUN 29*  CREATININE 0.96  CALCIUM 9.3   Liver Function Tests:  Recent Labs Lab 06/01/13 1423  AST 26  ALT 23  ALKPHOS 35*  BILITOT 0.5  PROT 7.5  ALBUMIN 4.0   No results found for this basename: LIPASE, AMYLASE,  in the last 168 hours No  results found for this basename: AMMONIA,  in the last 168 hours CBC:  Recent Labs Lab 06/01/13 1423  WBC 8.0  NEUTROABS 5.4  HGB 14.0  HCT 41.0  MCV 85.2  PLT 235   Cardiac Enzymes:  Recent Labs Lab 06/01/13 1423  TROPONINI <0.30   BNP (last 3 results) No results found for this basename: PROBNP,  in the last 8760 hours CBG:  Recent Labs Lab 06/04/13 0648 06/04/13 1147 06/04/13 1634 06/04/13 2133 06/05/13 0651  GLUCAP 116* 100* 90 115* 116*    No results found for this or any previous visit (from the past 240 hour(s)).   Studies: No results found.  Scheduled Meds: . atorvastatin  40 mg Oral QHS  . clopidogrel  75 mg Oral Q breakfast  . enoxaparin (LOVENOX) injection  50 mg Subcutaneous Q24H  . insulin aspart  0-5 Units Subcutaneous QHS  . insulin aspart  0-9 Units Subcutaneous TID WC  . metFORMIN  1,000 mg Oral BID WC  . pregabalin  150 mg Oral BID   Continuous Infusions: . sodium chloride 75 mL/hr at 06/02/13 2358    Principal Problem:   Stroke Active Problems:   Dysarthria   Hypertension  Time spent:    Sonja Manseau K  [facility] Pager [phone]. If 7PM-7AM, please contact night-coverage at [email or web address], password [facility] 06/05/2013, 8:09 AM  LOS: 4 days

## 2013-06-05 NOTE — Progress Notes (Signed)
Occupational Therapy Treatment Patient Details Name: Thomas Nixon MRN: [id number] DOB: [birth year] Today's Date: 06/05/2013 Time: 4782-9562 OT Time Calculation (min): 24 min  OT Assessment / Plan / Recommendation  OT comments  Pt demonstrates Lt LE dragging with ambulation to bathroom. Pt demonstrates LT LE fatigue with static standing for grooming adl. Pt could benefit from CIR to progress to complete adls in standing. Pt requires sitting at this time due to balance deficits. OT to continue to follow acutely. Next session to focus on static balance and sink level grooming for incr time.  Follow Up Recommendations  CIR    Barriers to Discharge       Equipment Recommendations  3 in 1 bedside comode    Recommendations for Other Services Rehab consult  Frequency Min 3X/week   Progress towards OT Goals Progress towards OT goals: Progressing toward goals  Plan Discharge plan remains appropriate    Precautions / Restrictions Precautions Precautions: Fall Restrictions Weight Bearing Restrictions: No   Pertinent Vitals/Pain No pain Pt reports feeling fatigued s/p 2 therapy session. Pt states "that was work"- pt demonstrates ability to perform up to 3 hours of therapy as required CIR.    ADL  Grooming: Teeth care;Wash/dry face;Minimal assistance;Other (comment) (Left Ue support on sink. leaning against sink for support) Where Assessed - Grooming: Supported standing Upper Body Bathing: Chest;Right arm;Left arm;Abdomen;Minimal assistance Where Assessed - Upper Body Bathing: Supported sitting (requires sitting due to unsafe static standing) Lower Body Bathing: Minimal assistance Where Assessed - Lower Body Bathing: Unsupported sitting (requires sitting due to unsafe static standing) Upper Body Dressing: Minimal assistance Where Assessed - Upper Body Dressing: Unsupported sitting Lower Body Dressing: Minimal assistance Where Assessed - Lower Body Dressing: Supported sitting (needed mod v/c  to dress affected side first) Toilet Transfer: Minimal assistance Toilet Transfer Method: Sit to stand Toilet Transfer Equipment: Raised toilet seat with arms (or 3-in-1 over toilet) (cues for hand placement) Toileting - Clothing Manipulation and Hygiene: Moderate assistance Where Assessed - Toileting Clothing Manipulation and Hygiene: Sit to stand from 3-in-1 or toilet Equipment Used: Gait belt;Rolling walker Transfers/Ambulation Related to ADLs: Pt demonstrates Lt LE dragging with ambulation. pt has decr swing through with the left le compared to right LE. pt leaning to the left side and needs facilitation to correct posture. Pt responding to facilitation however needs repetition at CIR to help correct postural left lean.  ADL Comments: Pt finishing with PT on arrival. Pt completed sit<>stand from recliner requiring bil UE to push from arm rest. Pt static standing with slight left lean. Pt given v/c and facilitation for correction. pt progressed to sink level with Lt Le dragging. pt static standing at sink with LT Ue supported on sink surface. Pt with support from therapist to reach for objects that challenge dynamic static standing with reach . Pt requires UE support and therapist for safety. Pt with progressive lt knee flexion in static standing due to fatigue. pt required return to sitting on 3n1 due to left le fatigue s/p washing face and pushing teeth terminating prematurely. Pt finished oral care in sitting. pt static standing to fill basin, apply soap and baby powder to water as precursor to bathing. pt required return to sitting x2 due to left le fatigue. Pt completed all bathing in sitting. pt educated on dressing Lt LE first to incr independence. Pt required extended time and effort. Pt understands the sequnce but requires repetition to incr independence with task. Strong CIR candidate at this time  Pt  demonstrates incoordination with LT UE-- undershooting to open tooth paste and to hold object.  Pt requires visual attention to maintain a loose grasp on adl items. Pt placing objects deep into palm to decr dropping object as a compensatory strategy self taught. Pt is not holding objects in a normal functional manner and this is not baseline for patient. Pt could use CIR to incr functional grasp and use of Lt UE.   OT Diagnosis:    OT Problem List:   OT Treatment Interventions:     OT Goals(current goals can now be found in the care plan section) Acute Rehab OT Goals Patient Stated Goal: to walk, be independent OT Goal Formulation: With patient Time For Goal Achievement: 06/16/13 Potential to Achieve Goals: Good ADL Goals Pt Will Perform Grooming: with min assist;standing Pt Will Perform Lower Body Dressing: with min assist;with adaptive equipment;sitting/lateral leans Pt Will Transfer to Toilet: with min assist;bedside commode Additional ADL Goal #1: Pt will use Lt UE to retrieve 2 out 3 ADL items during session   Visit Information  Last OT Received On: 06/05/13 Assistance Needed: +2 History of Present Illness: 42 y/o adm. for stroke last Saturday while on a cruise. Symptoms consisted of left facial droop, dysarthria and left leg/arm numbness.  On Sunday the ship made port in [place] and patient was brought to hospital where only MRI (per wife) was done.  He did not want to stay there and came back to [facility] where he lives to go to hospital here (for both care and possibility of going to rehab). [facility] showed chronic hemorrhagic infarcts bilaterally and acute/subacute infarct in the posterior limb internal capsule and deep white matter in the right frontal parietal lobe.      Subjective Data      Prior Functioning       Cognition  Cognition Arousal/Alertness: Awake/alert Behavior During Therapy: WFL for tasks assessed/performed Overall Cognitive Status: History of cognitive impairments - at baseline Memory:  (recalling therapist by name on Arrival)     Mobility  Bed Mobility Bed Mobility: Not assessed Transfers Transfers: Sit to Stand;Stand to Sit Sit to Stand: 4: Min assist;With upper extremity assist;From chair/3-in-1 Stand to Sit: 4: Min assist;With upper extremity assist;To chair/3-in-1 Details for Transfer Assistance: pt needs v/c for hand placement. pt attempting to pull up on sink in bathroom. pt with extended effort to attempt to control descend to chair.     Exercises      Balance     End of Session OT - End of Session Activity Tolerance: Patient tolerated treatment well Patient left: in bed;with call bell/phone within reach Nurse Communication: Mobility status;Precautions  GO     Lucile Shutters 06/05/2013, 10:31 AM Pager: [phone]

## 2013-06-05 NOTE — ED Notes (Deleted)
Clinical Social Work Department CLINICAL SOCIAL WORK PLACEMENT NOTE 06/05/2013  Patient:  Thomas Nixon, Thomas Nixon  Account Number:  [id number] Admit date:  03/23/2011  Clinical Social Worker:  Pollyann Savoy, LCSW  Date/time:  06/01/2013 11:30 PM  Clinical Social Work is seeking post-discharge placement for this patient at the following level of care:   SKILLED NURSING   (*CSW will update this form in Epic as items are completed)   06/01/2013  Patient/family provided with [facility] Department of Clinical Social Work's list of facilities offering this level of care within the geographic area requested by the patient (or if unable, by the patient's family).  06/01/2013  Patient/family informed of their freedom to choose among providers that offer the needed level of care, that participate in Medicare, Medicaid or managed care program needed by the patient, have an available bed and are willing to accept the patient.  06/01/2013  Patient/family informed of [facility]' ownership interest in [facility], as well as of the fact that they are under no obligation to receive care at this facility.  PASARR submitted to EDS on 06/01/2013 PASARR number received from EDS on   FL2 transmitted to all facilities in geographic area requested by pt/family on   FL2 transmitted to all facilities within larger geographic area on   Patient informed that his/her managed care company has contracts with or will negotiate with  certain facilities, including the following:     Patient/family informed of bed offers received:   Patient chooses bed at  Physician recommends and patient chooses bed at    Patient to be transferred to  on   Patient to be transferred to facility by   The following physician request were entered in Epic:   Additional Comments:

## 2013-06-05 NOTE — H&P (Signed)
Physical Medicine and Rehabilitation Admission H&P    Chief Complaint  Patient presents with  . Weakness  : HPI: Thomas Nixon is a 42 y.o. right-handed male with history of hypertension, diabetes mellitus with peripheral neuropathy and multiple CVAs with residual left-sided weakness maintained on Aggrenox. Patient independent prior to admission living with his wife. By report patient has had full workup in the past for noted CVAs at outside hospitals including [facility], [facility], [facility] and [facility]. Patient on Aggrenox therapy prior to admission for CVA prophylaxis. Admitted 06/01/2013 with left facial droop which developed while patient was on a cruise in [place]. An MRI was completed at outside Hospital results not made available patient requested to come back to [facility] for ongoing care. MRI of the brain showed extensive chronic ischemic changes as well as acute/subacute infarct in the posterior limb internal capsule and deep white matter in the right frontal parietal lobe. MRA of the head negative for aneurysm no significant intracranial stenosis. Echocardiogram with ejection fraction of 55% no wall motion abnormalities. Carotid Dopplers with less than 39% ICA stenosis. Patient did not receive TPA. Neurology services consulted placed on Plavix for CVA prophylaxis. Subcutaneous Lovenox added for DVT prophylaxis. Physical and occupational therapy evaluations completed an ongoing with recommendations of physical medicine rehabilitation consult to consider inpatient rehabilitation services. Patient was felt to be a good candidate for inpatient rehabilitation services and was admitted for comprehensive rehabilitation program  Review of Systems  Neurological: Positive for dizziness, tingling, weakness and headaches.  All other systems reviewed and are negative   Past Medical History  Diagnosis Date  . Stroke     5 strokes  . Hypertension   . Diabetes mellitus without complication     History reviewed. No pertinent past surgical history. History reviewed. No pertinent family history. Social History:  reports that he has never smoked. He does not have any smokeless tobacco history on file. He reports that  drinks alcohol. He reports that he does not use illicit drugs. Allergies: No Known Allergies Medications Prior to Admission  Medication Sig Dispense Refill  . amLODipine (NORVASC) 5 MG tablet Take 5 mg by mouth daily.      Marland Kitchen atorvastatin (LIPITOR) 40 MG tablet Take 40 mg by mouth at bedtime.      . dipyridamole-aspirin (AGGRENOX) 200-25 MG per 12 hr capsule Take 1 capsule by mouth 2 (two) times daily.      . metFORMIN (GLUMETZA) 1000 MG (MOD) 24 hr tablet Take 1,000 mg by mouth 2 (two) times daily.      . pregabalin (LYRICA) 150 MG capsule Take 150 mg by mouth 2 (two) times daily.      Marland Kitchen spironolactone (ALDACTONE) 50 MG tablet Take 50 mg by mouth daily.        Home: Home Living Family/patient expects to be discharged to:: Private residence Living Arrangements: Spouse/significant other Available Help at Discharge: Family Type of Home:  (town home) Home Access: Stairs to enter Secretary/administrator of Steps: 5-6 Entrance Stairs-Rails: Right Home Layout: Two level Home Equipment: Environmental consultant - 2 wheels Additional Comments: newly weds married 2 weeks   Functional History:    Functional Status:  Mobility: Bed Mobility Bed Mobility: Supine to Sit Supine to Sit: 4: Min assist;With rails (incr effort and multiple attempts, pulling with rt UE) Transfers Transfers: Sit to Stand;Stand to Sit;Stand Pivot Transfers Sit to Stand: 4: Min assist;With upper extremity assist;From bed Stand to Sit: 4: Min assist;With upper extremity assist;To chair/3-in-1 Stand  Pivot Transfers: 1: +2 Total assist Stand Pivot Transfers: Patient Percentage: 50% Ambulation/Gait Ambulation/Gait Assistance: 1: +2 Total assist Ambulation/Gait: Patient Percentage: 60% Ambulation Distance  (Feet): 1 Feet Assistive device: None Ambulation/Gait Assistance Details: upward facilitation to elevate pelvis on the left during static standing and weight shift to the left during pre-gait activities, limited ability to maintain adequate contraction, strength fades and patient drifts to the left with limited control General Gait Details: pre-gait reciprocal steps fwd/bkwd standing EOB    ADL: ADL Eating/Feeding: Set up Where Assessed - Eating/Feeding: Chair Grooming: Wash/dry face;Teeth care;Wash/dry hands;Set up Where Assessed - Grooming: Supported sitting Upper Body Bathing: Chest;Right arm;Left arm;Abdomen;Minimal assistance Where Assessed - Upper Body Bathing: Supported sitting Upper Body Dressing: Minimal assistance Where Assessed - Upper Body Dressing: Supported sitting Lower Body Dressing: Moderate assistance Where Assessed - Lower Body Dressing: Supported sit to stand Toilet Transfer: Minimal assistance Toilet Transfer Method: Stand pivot Acupuncturist: Raised toilet seat with arms (or 3-in-1 over toilet) Equipment Used: Gait belt;Rolling walker Transfers/Ambulation Related to ADLs: Pt ambulating with Lt hand loose grasp RW and left foot lag. Pt needed cues for weight shift and decr foot drag of toes.  ADL Comments: Pt recalling therapist and purpose of therapy arriving. pt voiding on bed pan with tech on arrival due to urgency. Pt educated the need to use 3n1 to help with transfers. Pt agreeable. Pt ambulated to restroom with left foot drag with RW. pt sitting during ADL to decr LE fatigue during prolonged standing. pt sitting for ADLs. Pt dropping wash cloth in left hand several times. Pt needed Rt hand to reposition in left hand. Pt using a hook like grasp to maintain wash cloth  in palm. Pt dropping tooth brush on floor attempting to place object in left hand. pt holding tooth brush in left hand and looked away from hand to counter surface and due to no visual  attention dropped tooth brush. Pt with incr ability to express needs verbally today compared to 06/02/13. Pt likes to joke and laughs often throughout session. pt declined LB bathing standing my wife can help me.  Cognition: Cognition Overall Cognitive Status: History of cognitive impairments - at baseline Orientation Level: Oriented X4 Cognition Arousal/Alertness: Awake/alert Behavior During Therapy: WFL for tasks assessed/performed Overall Cognitive Status: History of cognitive impairments - at baseline Memory: Decreased short-term memory  Physical Exam: Blood pressure 114/78, pulse 55, temperature 98.7 F (37.1 C), temperature source Oral, resp. rate 18, height 5\' 10"  (1.778 m), weight 104.9 kg (231 lb 4.2 oz), SpO2 100.00%. Constitutional: He is oriented to person, place, and time.  HENT: dentition good. Oral mucosa pink and moist Head: Normocephalic.  Eyes: EOM are normal.  Neck: Normal range of motion. Neck supple. No thyromegaly present.  Cardiovascular: Normal rate and regular rhythm. No murmurs Pulmonary/Chest: Effort normal and breath sounds normal. No respiratory distress. No rales, wheezes Abdominal: Soft. Bowel sounds are normal. He exhibits distension.  Neurological: He is alert and oriented to person, place, and time.  Patient follows three-step command  Skin: Skin is warm and dry.  motor strength 4-/5 in the left deltoid, bicep, tricep, grip. 4/5 left hip flexor, knee extensors, ankle dorsi flexion plantar flexor 5/5 in the right side. Left pronator drift Sensory exam is normal to light touch and pinprick in all 4 limbs.  Speech is dysarthric but intellgibile. Left central 7 and tongue deviation.  Visual fields are intact confrontation testing  Cerebellar testing shows ataxia finger nose to finger  in the left upper    Results for orders placed during the hospital encounter of 06/01/13 (from the past 48 hour(s))  GLUCOSE, CAPILLARY     Status: Abnormal   Collection  Time    06/03/13 11:55 AM      Result Value Range   Glucose-Capillary 104 (*) 70 - 99 mg/dL   Comment 1 Notify RN     Comment 2 Documented in Chart    GLUCOSE, CAPILLARY     Status: Abnormal   Collection Time    06/03/13  4:52 PM      Result Value Range   Glucose-Capillary 107 (*) 70 - 99 mg/dL   Comment 1 Notify RN     Comment 2 Documented in Chart    GLUCOSE, CAPILLARY     Status: Abnormal   Collection Time    06/03/13  9:36 PM      Result Value Range   Glucose-Capillary 104 (*) 70 - 99 mg/dL  GLUCOSE, CAPILLARY     Status: Abnormal   Collection Time    06/04/13  6:48 AM      Result Value Range   Glucose-Capillary 116 (*) 70 - 99 mg/dL  GLUCOSE, CAPILLARY     Status: Abnormal   Collection Time    06/04/13 11:47 AM      Result Value Range   Glucose-Capillary 100 (*) 70 - 99 mg/dL   Comment 1 Notify RN     Comment 2 Documented in Chart    GLUCOSE, CAPILLARY     Status: None   Collection Time    06/04/13  4:34 PM      Result Value Range   Glucose-Capillary 90  70 - 99 mg/dL   Comment 1 Documented in Chart     Comment 2 Notify RN    GLUCOSE, CAPILLARY     Status: Abnormal   Collection Time    06/04/13  9:33 PM      Result Value Range   Glucose-Capillary 115 (*) 70 - 99 mg/dL  GLUCOSE, CAPILLARY     Status: Abnormal   Collection Time    06/05/13  6:51 AM      Result Value Range   Glucose-Capillary 116 (*) 70 - 99 mg/dL   No results found.  Post Admission Physician Evaluation: 1. Functional deficits secondary  to thrombotic right posterior limb of the internal capsule infarct, hx of prior cva's. 2. Patient is admitted to receive collaborative, interdisciplinary care between the physiatrist, rehab nursing staff, and therapy team. 3. Patient's level of medical complexity and substantial therapy needs in context of that medical necessity cannot be provided at a lesser intensity of care such as a SNF. 4. Patient has experienced substantial functional loss from his/her  baseline which was documented above under the "Functional History" and "Functional Status" headings.  Judging by the patient's diagnosis, physical exam, and functional history, the patient has potential for functional progress which will result in measurable gains while on inpatient rehab.  These gains will be of substantial and practical use upon discharge  in facilitating mobility and self-care at the household level. 5. Physiatrist will provide 24 hour management of medical needs as well as oversight of the therapy plan/treatment and provide guidance as appropriate regarding the interaction of the two. 6. 24 hour rehab nursing will assist with bladder management, bowel management, safety, skin/wound care, disease management, medication administration and patient education  and help integrate therapy concepts, techniques,education, etc. 7. PT will assess and treat for/with:  Lower extremity strength, range of motion, stamina, balance, functional mobility, safety, adaptive techniques and equipment, NMR, pt education.   Goals are: mod I. 8. OT will assess and treat for/with: ADL's, functional mobility, safety, upper extremity strength, adaptive techniques and equipment, NMR, pt/family education.   Goals are: mod I to supervision. 9. SLP will assess and treat for/with: speech, communication.  Goals are: mod I. 10. Case Management and Social Worker will assess and treat for psychological issues and discharge planning. 11. Team conference will be held weekly to assess progress toward goals and to determine barriers to discharge. 12. Patient will receive at least 3 hours of therapy per day at least 5 days per week. 13. ELOS: 7-8 days       14. Prognosis:  excellent   Medical Problem List and Plan: 1. 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 DVT Prophylaxis/Anticoagulation: Subcutaneous Lovenox. Monitor platelet counts and any signs of  bleeding 3. Pain Management: Tylenol as needed. 4. Neuropsych: This patient is capable of making decisions on his own behalf. 5. Diabetes mellitus with peripheral neuropathy. Hemoglobin A1c 6.2. Glucophage 1000 mg twice a day. Check blood sugars a.c. and at bedtime. 6. Hyperlipidemia. Lipitor 7. Hypertension. No present antihypertensive medications. Patient on Aldactone 50 mg daily and Norvasc 5 mg daily prior to admission. Monitor with increased activity  Ranelle Oyster, MD, [facility] Physical Medicine & Rehabilitation   06/05/2013

## 2013-06-05 NOTE — Progress Notes (Signed)
Speech Language Pathology Dysphagia Treatment Patient Details Name: Thomas Nixon MRN: [id number] DOB: [birth year] Today's Date: 06/05/2013 Time: 6578-4696 SLP Time Calculation (min): 17 min  Assessment / Plan / Recommendation Clinical Impression  Pt seen for skilled dysphagia treatment to assess tolerance of po diet and appropriateness to discontinue chin tuck posture.  Pt reports conducting chin tuck approximately 30% of opportunities and admits to 2 "choking" events this weekend on liquids.  He is cognizant of his dysphagia/aspiration and uses caution with intake.  No s/s of aspiration noted with multilple boluses of grape juice with head neutral.   Pt masticated cracker with mild oral stasis left side - clearing with cued dry swallow.    Recommend pt continue regular/thin and use chin tuck if needed.  SLP provided pt with compensation strategies for his dysarthria included slow rate and increasing phonatory strength.  Instructed pt to record himself and play back for comprehension of speech.    MD please order speech evaluation, no follow up indicated for swallow.      Diet Recommendation  Continue with Current Diet: Regular;Thin liquid    SLP Plan All goals met   Pertinent Vitals/Pain Afebrile, decreased   Swallowing Goals  SLP Swallowing Goals Swallow Study Goal #2 - Progress: Met  General Temperature Spikes Noted: No Respiratory Status: Room air Behavior/Cognition: Alert;Cooperative;Pleasant mood Oral Cavity - Dentition: Adequate natural dentition Patient Positioning: Upright in bed  Oral Cavity - Oral Hygiene Does patient have any of the following "at risk" factors?: None of the above   Dysphagia Treatment Treatment focused on: Skilled observation of diet tolerance;Upgraded PO texture trials;Utilization of compensatory strategies Treatment Methods/Modalities: Skilled observation;Differential diagnosis Patient observed directly with PO's: Yes Type of PO's observed:  Regular;Thin liquids Feeding: Able to feed self Liquids provided via: Cup;Straw Oral Phase Signs & Symptoms: Left pocketing   GO     Donavan Burnet, MS [facility] SLP [phone]

## 2013-06-05 NOTE — Progress Notes (Signed)
Supporting documents for CIR admit faxed to pt's insurance-await reply. [phone]

## 2013-06-05 NOTE — Progress Notes (Signed)
Physical Therapy Treatment Patient Details Name: Thomas Nixon MRN: [id number] DOB: 07/16/1971 Today's Date: 06/05/2013 Time: 4098-1191 PT Time Calculation (min): 18 min  PT Assessment / Plan / Recommendation  PT Comments   Pt demo gt abnormalities which worsen with fatigue. Pt able to increase amb distance with mod (A) and RW; requires sitting rest break due to fatigue. Pt tends to drag L LE and demo balance deficits with gt. Next session to focus on dynamic gt activities. Pt would benefit greatly from increase repetition at CIR to correct gt abnormalities and increase independence with gt and transfers.  Pt is highly motivated to return to PLOF.   Follow Up Recommendations  CIR     Does the patient have the potential to tolerate intense rehabilitation     Barriers to Discharge        Equipment Recommendations  None recommended by PT    Recommendations for Other Services    Frequency Min 4X/week   Progress towards PT Goals Progress towards PT goals: Progressing toward goals  Plan Current plan remains appropriate    Precautions / Restrictions Precautions Precautions: Fall Restrictions Weight Bearing Restrictions: No   Pertinent Vitals/Pain See vitals. No new complaints.     Mobility  Bed Mobility Bed Mobility: Supine to Sit;Sitting - Scoot to Edge of Bed Supine to Sit: 4: Min guard;HOB elevated;With rails Sitting - Scoot to Edge of Bed: 5: Supervision;With rail Details for Bed Mobility Assistance: pt requires increased time and cues for hand placement and sequencing; has difficulty initiating transfer, with multimodal cues can become more indepdent and complete supine to sit; max encouragement  Transfers Transfers: Sit to Stand;Stand to Sit Sit to Stand: 4: Min assist;From bed;With upper extremity assist Stand to Sit: 4: Min assist;With upper extremity assist;To chair/3-in-1 Details for Transfer Assistance: mod to max cues for hand placement and safety; pt attempts to pull up  on RW to stand; requires (A) to control descent to chair with max cues to use bil UEs to control descent  Ambulation/Gait Ambulation/Gait Assistance: 3: Mod assist;Other (comment) (2nd person for lines and chair management ) Ambulation Distance (Feet): 30 Feet (x2) Assistive device: Rolling walker Ambulation/Gait Assistance Details: pt requires mod multimodal cues for gt sequencing and safety with RW; in room pt was able to increase step length with mod cues to faclitate weightshift; once in the hallway in a distractable enviroment pt with short shuffled steps; pt tends to lean to R with amb and requires multimodal cues to control trajectory with RW; pt tends to drift to R.  constant verbal cues for upright psoture; with fatigue pt demo decrease step length and foot drop on L LE, required sitting rest break due to fatigue Gait Pattern: Shuffle;Narrow base of support;Step-to pattern;Decreased stance time - left;Trunk flexed;Left genu recurvatum Gait velocity: decreased; unable to safely increase speed at this time General Gait Details: performed pre-gt fwd/bkwd in room with facilitation to shift weight Stairs: No Wheelchair Mobility Wheelchair Mobility: No         PT Diagnosis:    PT Problem List:   PT Treatment Interventions:     PT Goals (current goals can now be found in the care plan section) Acute Rehab PT Goals Patient Stated Goal: to walk as much as i can  PT Goal Formulation: With patient Time For Goal Achievement: 06/09/13 Potential to Achieve Goals: Good  Visit Information  Last PT Received On: 06/05/13 Assistance Needed: +2 History of Present Illness: 42 y/o adm. for stroke last  Saturday while on a cruise. Symptoms consisted of left facial droop, dysarthria and left leg/arm numbness.  On Sunday the ship made port in [place] and patient was brought to hospital where only MRI (per wife) was done.  He did not want to stay there and came back to [facility] where he lives to go to  hospital here (for both care and possibility of going to rehab). [facility] showed chronic hemorrhagic infarcts bilaterally and acute/subacute infarct in the posterior limb internal capsule and deep white matter in the right frontal parietal lobe.      Subjective Data  Subjective: pt lying supine; eager to walk.  Patient Stated Goal: to walk as much as i can    Copywriter, advertising Arousal/Alertness: Awake/alert Behavior During Therapy: WFL for tasks assessed/performed Overall Cognitive Status: History of cognitive impairments - at baseline Memory:  (able to recall "therapy wears purple")    Balance  Balance Balance Assessed: Yes Static Standing Balance Static Standing - Balance Support: No upper extremity supported Static Standing - Level of Assistance: 3: Mod assist;4: Min assist Static Standing - Comment/# of Minutes: with fatigue requires increased (A) to maintain balance  End of Session PT - End of Session Equipment Utilized During Treatment: Gait belt Activity Tolerance: Patient tolerated treatment well Patient left: in chair;with call bell/phone within reach Nurse Communication: Mobility status   GP     Donell Sievert, [place] [phone] 06/05/2013, 12:53 PM

## 2013-06-05 NOTE — Progress Notes (Signed)
Clinical Social Work Department BRIEF PSYCHOSOCIAL ASSESSMENT 06/05/2013  Patient:  Thomas Nixon, Thomas Nixon     Account Number:  [id number]     Admit date:  06/01/2013  Clinical Social Worker:  Illene Silver  Date/Time:  06/05/2013 08:52 AM  Referred by:  CSW  Date Referred:  06/05/2013 Referred for  SNF Placement   Other Referral:   Interview type:  Patient Other interview type:   Database review.    PSYCHOSOCIAL DATA Living Status:  WIFE Admitted from facility:   Level of care:   Primary support name:  cynthia Loper Primary support relationship to patient:  SPOUSE Degree of support available:   Good.  Pt reports a supportive wife.    CURRENT CONCERNS Current Concerns  Post-Acute Placement   Other Concerns:    SOCIAL WORK ASSESSMENT / PLAN CSW met with pt and explained role of CSW/dcp.  While pt is currently a candidate for CIR, he understands that we must look for alternative rehab options in the community simultaneously. CSW spoke with pt re: doing a SNF search in [place]. and he is agreeable to the plan.   Assessment/plan status:  Psychosocial Support/Ongoing Assessment of Needs Other assessment/ plan:   Information/referral to community resources:    PATIENT'S/FAMILY'S RESPONSE TO PLAN OF CARE: Pt agreeable to placement in the community in case CIR is not an option.  Pt anticpated that his wife will have many questions re: other options for rehab if pt does not go to CIR.  Unit CSW will follow up.

## 2013-06-05 NOTE — Progress Notes (Signed)
Clinical Social Work Department CLINICAL SOCIAL WORK PLACEMENT NOTE 06/05/2013  Patient:  Thomas Nixon, Thomas Nixon  Account Number:  [id number] Admit date:  06/01/2013  Clinical Social Worker:  Pollyann Savoy, LCSW  Date/time:  06/05/2013 09:03 PM  Clinical Social Work is seeking post-discharge placement for this patient at the following level of care:   SKILLED NURSING   (*CSW will update this form in Epic as items are completed)     Patient/family provided with [facility] Department of Clinical Social Work's list of facilities offering this level of care within the geographic area requested by the patient (or if unable, by the patient's family).  06/05/2013  Patient/family informed of their freedom to choose among providers that offer the needed level of care, that participate in Medicare, Medicaid or managed care program needed by the patient, have an available bed and are willing to accept the patient.  06/05/2013  Patient/family informed of [facility]' ownership interest in [facility], as well as of the fact that they are under no obligation to receive care at this facility.  PASARR submitted to EDS on 05/26/2013 PASARR number received from EDS on   FL2 transmitted to all facilities in geographic area requested by pt/family on  06/05/2013 FL2 transmitted to all facilities within larger geographic area on   Patient informed that his/her managed care company has contracts with or will negotiate with  certain facilities, including the following:     Patient/family informed of bed offers received:   Patient chooses bed at  Physician recommends and patient chooses bed at    Patient to be transferred to  on   Patient to be transferred to facility by   The following physician request were entered in Epic:   Additional Comments:

## 2013-06-06 ENCOUNTER — Inpatient Hospital Stay (HOSPITAL_COMMUNITY)
Admission: RE | Admit: 2013-06-06 | Discharge: 2013-06-14 | DRG: 945 | Disposition: A | Discharge status: Home / Self Care | Payer: Medicare PPO | Source: Intra-hospital | Attending: Physical Medicine & Rehabilitation | Admitting: Physical Medicine & Rehabilitation

## 2013-06-06 DIAGNOSIS — Z79899 Other long term (current) drug therapy: Secondary | ICD-10-CM | POA: Diagnosis not present

## 2013-06-06 DIAGNOSIS — I10 Essential (primary) hypertension: Secondary | ICD-10-CM

## 2013-06-06 DIAGNOSIS — Z8673 Personal history of transient ischemic attack (TIA), and cerebral infarction without residual deficits: Secondary | ICD-10-CM | POA: Diagnosis not present

## 2013-06-06 DIAGNOSIS — E785 Hyperlipidemia, unspecified: Secondary | ICD-10-CM

## 2013-06-06 DIAGNOSIS — E1142 Type 2 diabetes mellitus with diabetic polyneuropathy: Secondary | ICD-10-CM

## 2013-06-06 DIAGNOSIS — Z7902 Long term (current) use of antithrombotics/antiplatelets: Secondary | ICD-10-CM | POA: Diagnosis not present

## 2013-06-06 DIAGNOSIS — E1149 Type 2 diabetes mellitus with other diabetic neurological complication: Secondary | ICD-10-CM

## 2013-06-06 DIAGNOSIS — R29810 Facial weakness: Secondary | ICD-10-CM

## 2013-06-06 DIAGNOSIS — R471 Dysarthria and anarthria: Secondary | ICD-10-CM

## 2013-06-06 DIAGNOSIS — R279 Unspecified lack of coordination: Secondary | ICD-10-CM

## 2013-06-06 DIAGNOSIS — Z5189 Encounter for other specified aftercare: Secondary | ICD-10-CM | POA: Diagnosis present

## 2013-06-06 DIAGNOSIS — I6330 Cerebral infarction due to thrombosis of unspecified cerebral artery: Secondary | ICD-10-CM

## 2013-06-06 DIAGNOSIS — R29898 Other symptoms and signs involving the musculoskeletal system: Secondary | ICD-10-CM

## 2013-06-06 LAB — CBC
HCT: 41.8 % (ref 39.0–52.0)
Hemoglobin: 14.5 g/dL (ref 13.0–17.0)
MCH: 29.5 pg (ref 26.0–34.0)
MCHC: 34.7 g/dL (ref 30.0–36.0)
MCV: 85.1 fL (ref 78.0–100.0)
Platelets: 277 10*3/uL (ref 150–400)
RBC: 4.91 MIL/uL (ref 4.22–5.81)
RDW: 12.8 % (ref 11.5–15.5)
WBC: 8.8 10*3/uL (ref 4.0–10.5)

## 2013-06-06 LAB — CREATININE, SERUM
Creatinine, Ser: 1.1 mg/dL (ref 0.50–1.35)
GFR calc Af Amer: >90 mL/min (ref >90)
GFR calc non Af Amer: 83 mL/min — ABNORMAL LOW (ref >90)

## 2013-06-06 LAB — GLUCOSE, CAPILLARY
Glucose-Capillary: 101 mg/dL — ABNORMAL HIGH (ref 70–99)
Glucose-Capillary: 119 mg/dL — ABNORMAL HIGH (ref 70–99)
Glucose-Capillary: 91 mg/dL (ref 70–99)
Glucose-Capillary: 116 mg/dL — ABNORMAL HIGH (ref 70–99)
Glucose-Capillary: 133 mg/dL — ABNORMAL HIGH (ref 70–99)

## 2013-06-06 MED ORDER — ENOXAPARIN SODIUM 60 MG/0.6ML ~~LOC~~ SOLN: 50.0000 mg | SUBCUTANEOUS | Status: DC

## 2013-06-06 MED ORDER — SORBITOL 70 % SOLN: 30.0000 mL | Freq: Every day | Status: DC | PRN

## 2013-06-06 MED ORDER — ENOXAPARIN SODIUM 60 MG/0.6ML ~~LOC~~ SOLN
50.0000 mg | SUBCUTANEOUS | Status: DC
  Administered 2013-06-07 – 2013-06-14 (×8): 50 mg via SUBCUTANEOUS
  Filled 2013-06-06 (×8): qty 0.6

## 2013-06-06 MED ORDER — CLOPIDOGREL BISULFATE 75 MG PO TABS: 75.0000 mg | ORAL_TABLET | Freq: Every day | ORAL | Status: DC

## 2013-06-06 MED ORDER — METFORMIN HCL ER 500 MG PO TB24
1000.0000 mg | ORAL_TABLET | Freq: Two times a day (BID) | ORAL | Status: DC
  Administered 2013-06-07 – 2013-06-14 (×15): 1000 mg via ORAL
  Filled 2013-06-06 (×18): qty 2

## 2013-06-06 MED ORDER — ONDANSETRON HCL 4 MG/2ML IJ SOLN: 4.0000 mg | Freq: Four times a day (QID) | INTRAMUSCULAR | Status: DC | PRN

## 2013-06-06 MED ORDER — CLOPIDOGREL BISULFATE 75 MG PO TABS
75.0000 mg | ORAL_TABLET | Freq: Every day | ORAL | Status: DC
  Administered 2013-06-07 – 2013-06-14 (×8): 75 mg via ORAL
  Filled 2013-06-06 (×10): qty 1

## 2013-06-06 MED ORDER — PREGABALIN 50 MG PO CAPS
150.0000 mg | ORAL_CAPSULE | Freq: Two times a day (BID) | ORAL | Status: DC
  Administered 2013-06-06 – 2013-06-14 (×16): 150 mg via ORAL
  Filled 2013-06-06 (×8): qty 3
  Filled 2013-06-06: qty 1
  Filled 2013-06-06 (×7): qty 3
  Filled 2013-06-06: qty 2

## 2013-06-06 MED ORDER — PREGABALIN 50 MG PO CAPS: 150.0000 mg | ORAL_CAPSULE | Freq: Two times a day (BID) | ORAL | Status: DC

## 2013-06-06 MED ORDER — INSULIN ASPART 100 UNIT/ML ~~LOC~~ SOLN
0.0000 [IU] | Freq: Every day | SUBCUTANEOUS | Status: DC
Start: 2013-06-06 — End: 2013-06-14

## 2013-06-06 MED ORDER — ONDANSETRON HCL 4 MG PO TABS: 4.0000 mg | ORAL_TABLET | Freq: Four times a day (QID) | ORAL | Status: DC | PRN

## 2013-06-06 MED ORDER — ATORVASTATIN CALCIUM 40 MG PO TABS
40.0000 mg | ORAL_TABLET | Freq: Every day | ORAL | Status: DC
  Administered 2013-06-06 – 2013-06-13 (×8): 40 mg via ORAL
  Filled 2013-06-06 (×9): qty 1

## 2013-06-06 MED ORDER — ACETAMINOPHEN 325 MG PO TABS
325.0000 mg | ORAL_TABLET | ORAL | Status: DC | PRN
  Filled 2013-06-06: qty 2

## 2013-06-06 NOTE — Plan of Care (Signed)
Problem: Phase II Progression Outcomes Goal: Monitor D/C'd if no arrhythmias x 24 hrs Outcome: Progressing Patient has aberrant rhythm; has heart block.

## 2013-06-06 NOTE — H&P (View-Only) (Signed)
Physical Medicine and Rehabilitation Admission H&P    Chief Complaint  Patient presents with  . Weakness  : HPI: Thomas Nixon is a 41 y.o. right-handed male with history of hypertension, diabetes mellitus with peripheral neuropathy and multiple CVAs with residual left-sided weakness maintained on Aggrenox. Patient independent prior to admission living with his wife. By report patient has had full workup in the past for noted CVAs at outside hospitals including [facility], [facility], [facility] and [facility]. Patient on Aggrenox therapy prior to admission for CVA prophylaxis. Admitted 06/01/2013 with left facial droop which developed while patient was on a cruise in [place]. An MRI was completed at outside Hospital results not made available patient requested to come back to  for ongoing care. MRI of the brain showed extensive chronic ischemic changes as well as acute/subacute infarct in the posterior limb internal capsule and deep white matter in the right frontal parietal lobe. MRA of the head negative for aneurysm no significant intracranial stenosis. Echocardiogram with ejection fraction of 55% no wall motion abnormalities. Carotid Dopplers with less than 39% ICA stenosis. Patient did not receive TPA. Neurology services consulted placed on Plavix for CVA prophylaxis. Subcutaneous Lovenox added for DVT prophylaxis. Physical and occupational therapy evaluations completed an ongoing with recommendations of physical medicine rehabilitation consult to consider inpatient rehabilitation services. Patient was felt to be a good candidate for inpatient rehabilitation services and was admitted for comprehensive rehabilitation program  Review of Systems  Neurological: Positive for dizziness, tingling, weakness and headaches.  All other systems reviewed and are negative   Past Medical History  Diagnosis Date  . Stroke     5 strokes  . Hypertension   . Diabetes mellitus without complication     History reviewed. No pertinent past surgical history. History reviewed. No pertinent family history. Social History:  reports that he has never smoked. He does not have any smokeless tobacco history on file. He reports that  drinks alcohol. He reports that he does not use illicit drugs. Allergies: No Known Allergies Medications Prior to Admission  Medication Sig Dispense Refill  . amLODipine (NORVASC) 5 MG tablet Take 5 mg by mouth daily.      . atorvastatin (LIPITOR) 40 MG tablet Take 40 mg by mouth at bedtime.      . dipyridamole-aspirin (AGGRENOX) 200-25 MG per 12 hr capsule Take 1 capsule by mouth 2 (two) times daily.      . metFORMIN (GLUMETZA) 1000 MG (MOD) 24 hr tablet Take 1,000 mg by mouth 2 (two) times daily.      . pregabalin (LYRICA) 150 MG capsule Take 150 mg by mouth 2 (two) times daily.      . spironolactone (ALDACTONE) 50 MG tablet Take 50 mg by mouth daily.        Home: Home Living Family/patient expects to be discharged to:: Private residence Living Arrangements: Spouse/significant other Available Help at Discharge: Family Type of Home:  (town home) Home Access: Stairs to enter Entrance Stairs-Number of Steps: 5-6 Entrance Stairs-Rails: Right Home Layout: Two level Home Equipment: Walker - 2 wheels Additional Comments: newly weds married 2 weeks   Functional History:    Functional Status:  Mobility: Bed Mobility Bed Mobility: Supine to Sit Supine to Sit: 4: Min assist;With rails (incr effort and multiple attempts, pulling with rt UE) Transfers Transfers: Sit to Stand;Stand to Sit;Stand Pivot Transfers Sit to Stand: 4: Min assist;With upper extremity assist;From bed Stand to Sit: 4: Min assist;With upper extremity assist;To chair/3-in-1 Stand   Pivot Transfers: 1: +2 Total assist Stand Pivot Transfers: Patient Percentage: 50% Ambulation/Gait Ambulation/Gait Assistance: 1: +2 Total assist Ambulation/Gait: Patient Percentage: 60% Ambulation Distance  (Feet): 1 Feet Assistive device: None Ambulation/Gait Assistance Details: upward facilitation to elevate pelvis on the left during static standing and weight shift to the left during pre-gait activities, limited ability to maintain adequate contraction, strength fades and patient drifts to the left with limited control General Gait Details: pre-gait reciprocal steps fwd/bkwd standing EOB    ADL: ADL Eating/Feeding: Set up Where Assessed - Eating/Feeding: Chair Grooming: Wash/dry face;Teeth care;Wash/dry hands;Set up Where Assessed - Grooming: Supported sitting Upper Body Bathing: Chest;Right arm;Left arm;Abdomen;Minimal assistance Where Assessed - Upper Body Bathing: Supported sitting Upper Body Dressing: Minimal assistance Where Assessed - Upper Body Dressing: Supported sitting Lower Body Dressing: Moderate assistance Where Assessed - Lower Body Dressing: Supported sit to stand Toilet Transfer: Minimal assistance Toilet Transfer Method: Stand pivot Toilet Transfer Equipment: Raised toilet seat with arms (or 3-in-1 over toilet) Equipment Used: Gait belt;Rolling walker Transfers/Ambulation Related to ADLs: Pt ambulating with Lt hand loose grasp RW and left foot lag. Pt needed cues for weight shift and decr foot drag of toes.  ADL Comments: Pt recalling therapist and purpose of therapy arriving. pt voiding on bed pan with tech on arrival due to urgency. Pt educated the need to use 3n1 to help with transfers. Pt agreeable. Pt ambulated to restroom with left foot drag with RW. pt sitting during ADL to decr LE fatigue during prolonged standing. pt sitting for ADLs. Pt dropping wash cloth in left hand several times. Pt needed Rt hand to reposition in left hand. Pt using a hook like grasp to maintain wash cloth  in palm. Pt dropping tooth brush on floor attempting to place object in left hand. pt holding tooth brush in left hand and looked away from hand to counter surface and due to no visual  attention dropped tooth brush. Pt with incr ability to express needs verbally today compared to 06/02/13. Pt likes to joke and laughs often throughout session. pt declined LB bathing standing my wife can help me.  Cognition: Cognition Overall Cognitive Status: History of cognitive impairments - at baseline Orientation Level: Oriented X4 Cognition Arousal/Alertness: Awake/alert Behavior During Therapy: WFL for tasks assessed/performed Overall Cognitive Status: History of cognitive impairments - at baseline Memory: Decreased short-term memory  Physical Exam: Blood pressure 114/78, pulse 55, temperature 98.7 F (37.1 C), temperature source Oral, resp. rate 18, height 5' 10" (1.778 m), weight 104.9 kg (231 lb 4.2 oz), SpO2 100.00%. Constitutional: He is oriented to person, place, and time.  HENT: dentition good. Oral mucosa pink and moist Head: Normocephalic.  Eyes: EOM are normal.  Neck: Normal range of motion. Neck supple. No thyromegaly present.  Cardiovascular: Normal rate and regular rhythm. No murmurs Pulmonary/Chest: Effort normal and breath sounds normal. No respiratory distress. No rales, wheezes Abdominal: Soft. Bowel sounds are normal. He exhibits distension.  Neurological: He is alert and oriented to person, place, and time.  Patient follows three-step command  Skin: Skin is warm and dry.  motor strength 4-/5 in the left deltoid, bicep, tricep, grip. 4/5 left hip flexor, knee extensors, ankle dorsi flexion plantar flexor 5/5 in the right side. Left pronator drift Sensory exam is normal to light touch and pinprick in all 4 limbs.  Speech is dysarthric but intellgibile. Left central 7 and tongue deviation.  Visual fields are intact confrontation testing  Cerebellar testing shows ataxia finger nose to finger   in the left upper    Results for orders placed during the hospital encounter of 06/01/13 (from the past 48 hour(s))  GLUCOSE, CAPILLARY     Status: Abnormal   Collection  Time    06/03/13 11:55 AM      Result Value Range   Glucose-Capillary 104 (*) 70 - 99 mg/dL   Comment 1 Notify RN     Comment 2 Documented in Chart    GLUCOSE, CAPILLARY     Status: Abnormal   Collection Time    06/03/13  4:52 PM      Result Value Range   Glucose-Capillary 107 (*) 70 - 99 mg/dL   Comment 1 Notify RN     Comment 2 Documented in Chart    GLUCOSE, CAPILLARY     Status: Abnormal   Collection Time    06/03/13  9:36 PM      Result Value Range   Glucose-Capillary 104 (*) 70 - 99 mg/dL  GLUCOSE, CAPILLARY     Status: Abnormal   Collection Time    06/04/13  6:48 AM      Result Value Range   Glucose-Capillary 116 (*) 70 - 99 mg/dL  GLUCOSE, CAPILLARY     Status: Abnormal   Collection Time    06/04/13 11:47 AM      Result Value Range   Glucose-Capillary 100 (*) 70 - 99 mg/dL   Comment 1 Notify RN     Comment 2 Documented in Chart    GLUCOSE, CAPILLARY     Status: None   Collection Time    06/04/13  4:34 PM      Result Value Range   Glucose-Capillary 90  70 - 99 mg/dL   Comment 1 Documented in Chart     Comment 2 Notify RN    GLUCOSE, CAPILLARY     Status: Abnormal   Collection Time    06/04/13  9:33 PM      Result Value Range   Glucose-Capillary 115 (*) 70 - 99 mg/dL  GLUCOSE, CAPILLARY     Status: Abnormal   Collection Time    06/05/13  6:51 AM      Result Value Range   Glucose-Capillary 116 (*) 70 - 99 mg/dL   No results found.  Post Admission Physician Evaluation: 1. Functional deficits secondary  to thrombotic right posterior limb of the internal capsule infarct, hx of prior cva's. 2. Patient is admitted to receive collaborative, interdisciplinary care between the physiatrist, rehab nursing staff, and therapy team. 3. Patient's level of medical complexity and substantial therapy needs in context of that medical necessity cannot be provided at a lesser intensity of care such as a SNF. 4. Patient has experienced substantial functional loss from his/her  baseline which was documented above under the "Functional History" and "Functional Status" headings.  Judging by the patient's diagnosis, physical exam, and functional history, the patient has potential for functional progress which will result in measurable gains while on inpatient rehab.  These gains will be of substantial and practical use upon discharge  in facilitating mobility and self-care at the household level. 5. Physiatrist will provide 24 hour management of medical needs as well as oversight of the therapy plan/treatment and provide guidance as appropriate regarding the interaction of the two. 6. 24 hour rehab nursing will assist with bladder management, bowel management, safety, skin/wound care, disease management, medication administration and patient education  and help integrate therapy concepts, techniques,education, etc. 7. PT will assess and treat for/with:   Lower extremity strength, range of motion, stamina, balance, functional mobility, safety, adaptive techniques and equipment, NMR, pt education.   Goals are: mod I. 8. OT will assess and treat for/with: ADL's, functional mobility, safety, upper extremity strength, adaptive techniques and equipment, NMR, pt/family education.   Goals are: mod I to supervision. 9. SLP will assess and treat for/with: speech, communication.  Goals are: mod I. 10. Case Management and Social Worker will assess and treat for psychological issues and discharge planning. 11. Team conference will be held weekly to assess progress toward goals and to determine barriers to discharge. 12. Patient will receive at least 3 hours of therapy per day at least 5 days per week. 13. ELOS: 7-8 days       14. Prognosis:  excellent   Medical Problem List and Plan: 1. 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 DVT Prophylaxis/Anticoagulation: Subcutaneous Lovenox. Monitor platelet counts and any signs of  bleeding 3. Pain Management: Tylenol as needed. 4. Neuropsych: This patient is capable of making decisions on his own behalf. 5. Diabetes mellitus with peripheral neuropathy. Hemoglobin A1c 6.2. Glucophage 1000 mg twice a day. Check blood sugars a.c. and at bedtime. 6. Hyperlipidemia. Lipitor 7. Hypertension. No present antihypertensive medications. Patient on Aldactone 50 mg daily and Norvasc 5 mg daily prior to admission. Monitor with increased activity  Jomo Forand T. Chastidy Ranker, MD, FAAPMR [place] [facility]   06/05/2013 

## 2013-06-06 NOTE — Progress Notes (Signed)
Physical Therapy Treatment Patient Details Name: Cordell Guercio MRN: [id number] DOB: 05/21/1971 Today's Date: 06/06/2013 Time: 1111-1140 PT Time Calculation (min): 29 min  PT Assessment / Plan / Recommendation  PT Comments   Continues to demonstrate impairments in dynamic stability, weakness and problem solving skills impacting ability to ambulate independently without LOB and adjust body segments adequately, efficiently and safely to perform ADLs. Progressing with goals daily. Very motivated and remains an excellent CIR candidate.   Follow Up Recommendations  CIR     Does the patient have the potential to tolerate intense rehabilitation     Barriers to Discharge        Equipment Recommendations  None recommended by PT    Recommendations for Other Services    Frequency Min 4X/week   Progress towards PT Goals Progress towards PT goals: Progressing toward goals  Plan Current plan remains appropriate    Precautions / Restrictions Precautions Precautions: Fall Restrictions Weight Bearing Restrictions: No   Pertinent Vitals/Pain Denies pain    Mobility  Transfers Transfers: Sit to Stand;Stand to Sit Sit to Stand: 4: Min assist;4: Min guard Stand to Sit: 4: Min assist;4: Min guard Details for Transfer Assistance: sit<>stand x3 for strengthening and reinforcement of sequence, impulsive initially with inefficient pattern leading to LOB posteriorly x2 and return to chair needing minA for safety; with verbal cues to slow down and sequence properly he stood with mingaurdA Ambulation/Gait Ambulation/Gait Assistance: 4: Min assist Ambulation Distance (Feet): 30 Feet (x2) Assistive device: None Ambulation/Gait Assistance Details: verbal cues as well as tactile facilitation for proper weight shift (specifically to the right) to account for adequate foot clearance and larger step on the left, responds very well to verbal sequencing cues; very short shuffled steps with decreased trunk  rotation Gait Pattern: Decreased step length - left;Decreased step length - right;Decreased weight shift to right;Decreased weight shift to left;Decreased stride length;Left genu recurvatum;Narrow base of support;Left foot flat Gait velocity: decreased; unable to safely increase speed at this time due to high level of concentration from patient needing to focus on safe sequencing General Gait Details: decreased step height bilaterally Modified Rankin (Stroke Patients Only) Pre-Morbid Rankin Score: No significant disability Modified Rankin: Moderately severe disability    Exercises Other Exercises Other Exercises: standing marching x5 needing facilitation for weight shift and stability with SLS Other Exercises: squats x10 with minA for stability (without upper extremity support)   PT Diagnosis:    PT Problem List:   PT Treatment Interventions:     PT Goals (current goals can now be found in the care plan section)    Visit Information  Last PT Received On: 06/06/13 Assistance Needed: +1 History of Present Illness: 42 y/o adm. for stroke last Saturday while on a cruise. Symptoms consisted of left facial droop, dysarthria and left leg/arm numbness.  On Sunday the ship made port in [place] and patient was brought to hospital where only MRI (per wife) was done.  He did not want to stay there and came back to [facility] where he lives to go to hospital here (for both care and possibility of going to rehab). [facility] showed chronic hemorrhagic infarcts bilaterally and acute/subacute infarct in the posterior limb internal capsule and deep white matter in the right frontal parietal lobe.      Subjective Data      Cognition  Cognition Arousal/Alertness: Awake/alert Behavior During Therapy: WFL for tasks assessed/performed Overall Cognitive Status: History of cognitive impairments - at baseline  Balance  Static Standing Balance Static Standing - Balance Support: No upper  extremity supported Static Standing - Level of Assistance: 4: Min assist (mingaurdA) Static Standing - Comment/# of Minutes: facilitation for eccetric control of left knee to prevent hyperextension, again as he fatigues he drifts with increasing knee flexion High Level Balance High Level Balance Activites: Backward walking;Head turns High Level Balance Comments: attempted head turns without success today, pt unable to perform dual task activities at this time due to limited ability shift his focus away from adequate step length and weight shift for foot clearance  End of Session PT - End of Session Equipment Utilized During Treatment: Gait belt Activity Tolerance: Patient tolerated treatment well Patient left: in chair;with call bell/phone within reach Nurse Communication: Mobility status   GP     [facility] HELEN 06/06/2013, 12:29 PM

## 2013-06-06 NOTE — Progress Notes (Signed)
Pt. Discharged to rehab with stroke and beyond the hospital booklet. Information given to patient about home safety and precautions, S/S of stroke and the importance of follow-up appointment.

## 2013-06-06 NOTE — Clinical Social Work Note (Signed)
CSW re-sent clinicals to [facility] and spoke to Rains at [facility] who stated that we would need a firm denial from [facility] for CIR prior to her reviewing the pt for SNF.  CSW will continue to f/u.  Vickii Penna, LCSWA [phone]  Clinical Social Work

## 2013-06-06 NOTE — Progress Notes (Signed)
Patient admitted to 4W17 at 1800. Alert and oriented x 4, denies pain. Oriented to unit, rehab schedule, call bell system, and safety plan. Patient verbalized understanding. Thomas Nixon

## 2013-06-06 NOTE — Plan of Care (Signed)
Overall Plan of Care [facility]) Patient Details Name: Thomas Nixon MRN: [id number] DOB: May 22, 1971  Diagnosis:  Right PLIC infarct  Co-morbidities: dm2, htn  Functional Problem List  Patient demonstrates impairments in the following areas: Balance, Cognition, Endurance, Medication Management, Motor, Nutrition, Safety, Skin Integrity and Coordination  Basic ADL's: grooming, bathing, dressing and toileting Advanced ADL's: simple meal preparation, laundry and light housekeeping  Transfers:  bed mobility, bed to chair, toilet, tub/shower, car, furniture and floor Locomotion:  ambulation and stairs  Additional Impairments:  Functional use of upper extremity and Communication  expression  Anticipated Outcomes Item Anticipated Outcome  Eating/Swallowing  Mod I   Basic self-care  Mod I  Tolieting  Mod I  Bowel/Bladder  Continent bowel/bladder mod independent  Transfers  Mod I  Locomotion  300' Mod I (including community ambulation), 12 stairs one handrail mod I  Communication  Supervision   Cognition    Pain  No c/o pain  Safety/Judgment    Other  Skin: no new breakdown   Therapy Plan: PT Intensity: Minimum of 1-2 x/day ,45 to 90 minutes PT Frequency: 5 out of 7 days PT Duration Estimated Length of Stay: 7-10 days OT Intensity: Minimum of 1-2 x/day, 45 to 90 minutes OT Frequency: 5 out of 7 days OT Duration/Estimated Length of Stay: 7-10 days SLP Intensity: Minumum of 1-2 x/day, 30 to 90 minutes SLP Frequency: 5 out of 7 days SLP Duration/Estimated Length of Stay: 1 week    Team Interventions: Item RN PT OT SLP SW TR Other  Self Care/Advanced ADL Retraining   x      xNeuromuscular Re-Education  x x      Therapeutic Activities  x x x     UE/LE Strength Training/ROM  x x      UE/LE Coordination Activities  x x      Visual/Perceptual Remediation/Compensation         DME/Adaptive Equipment Instruction  x x      Therapeutic Exercise  x x      Balance/Vestibular  Training  x x      Patient/Family Education x x x x     Cognitive Remediation/Compensation  x x      Functional Mobility Training  x x      Ambulation/Gait Training  x       Museum/gallery curator  x       Wheelchair Propulsion/Positioning  x       Functional Tourist information centre manager Reintegration  x x      Dysphagia/Aspiration Landscape architect Facilitation    x     Bladder Management x        Bowel Management x        Disease Management/Prevention x x       Pain Management x x       Medication Management x        Skin Care/Wound Management x        Splinting/Orthotics  x       Discharge Planning  x x x     Psychosocial Support  x x                         Team Discharge Planning: Destination: PT-Home ,OT- Home , SLP-Home Projected Follow-up: PT-Outpatient PT, OT-  Home health OT vs. Outpatient OT, SLP-Outpatient SLP Projected Equipment Needs: PT-None recommended  by PT, OT- Tub/shower bench, SLP-None recommended by SLP Patient/family involved in discharge planning: PT- Patient,  OT-Patient, SLP-Patient  MD ELOS: 7-10 days Medical Rehab Prognosis:  Excellent Assessment: The patient has been admitted for CIR therapies. The team will be addressing, functional mobility, strength, stamina, balance, safety, adaptive techniques/equipment, self-care, bowel and bladder mgt, patient and caregiver education, NMR. Goals have been set at modified independent.    Ranelle Oyster, MD, FAAPMR      See Team Conference Notes for weekly updates to the plan of care

## 2013-06-06 NOTE — Discharge Summary (Signed)
Physician Discharge Summary  Thomas Nixon ZOX:[id number] DOB: May 05, 1971 DOA: 06/01/2013  PCP: Cain Saupe, MD  Admit date: 06/01/2013 Discharge date: 06/06/2013  Time spent: 30 minutes  Recommendations for Outpatient Follow-up:  Follow up with Dr. Pearlean Brownie in 2 months Discharge Diagnoses:  Principal Problem:   Stroke Active Problems:   Dysarthria   Hypertension   Discharge Condition: Stable  Diet recommendation: Heart Healthy  Filed Weights   06/01/13 2051  Weight: 104.9 kg (231 lb 4.2 oz)    History of present illness:  Nixon Thomas is an 42 y.o. 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 He had left side facial droop along with left sided weakness and increased dysarthria last week while he was on a cruise. MRI done last week reportedly showed an acute CVA and he was recommended rehab, but preferred to be in [place]. He tells me he had complete work up several years ago, and had a brain biopsy, though they don't know what it showed. MRI repeated here without contrast showed acute, subacute infarct in the internal capsule and deep right frontoparietal area with chronic hemorrhagic infarcts. EKG showed NSR, and serology was unramarkable. Neurology was consulted and recommended full repeat CVA work up, to change to Plavix, and to include thrombophilia work up. Hospitalist was asked to admit him.   Hospital Course:  Recurrent CVA:  - Neurology was consulted - Patient was cont with plavix  - hypercoag panel was thus far unremarkable  - PT/OT was consulted DM:  - On metformin w/ SSI  HTN:  - average sbp of 120's  - bp meds were held to allow permissive htn  DVT Prophylaxis:  - Lovenox subQ  Procedures: 2D echo - 06/02/13 - 50-55% no cardiac emboli  Carotid dopplers 06/02/13 - unremarkable  Consultations: Neurology  CIR  Discharge Exam: Filed Vitals:   06/05/13 2200 06/06/13 0200 06/06/13  0600 06/06/13 1025  BP: 131/93 113/50 144/82 114/72  Pulse: 72 64 87 55  Temp: 98.4 F (36.9 C) 99.4 F (37.4 C) 98.9 F (37.2 C) 98.6 F (37 C)  TempSrc:    Oral  Resp: 18 18 18 18   Height:      Weight:      SpO2:  100% 100% 100%    General: Awake, in nad Cardiovascular: regular, s1, s2 Respiratory: normal resp effort, no wheezing  Discharge Instructions     Medication List    STOP taking these medications       dipyridamole-aspirin 200-25 MG per 12 hr capsule  Commonly known as:  AGGRENOX      TAKE these medications       amLODipine 5 MG tablet  Commonly known as:  NORVASC  Take 5 mg by mouth daily.     atorvastatin 40 MG tablet  Commonly known as:  LIPITOR  Take 40 mg by mouth at bedtime.     clopidogrel 75 MG tablet  Commonly known as:  PLAVIX  Take 1 tablet (75 mg total) by mouth daily with breakfast.     metFORMIN 1000 MG (MOD) 24 hr tablet  Commonly known as:  GLUMETZA  Take 1,000 mg by mouth 2 (two) times daily.     pregabalin 150 MG capsule  Commonly known as:  LYRICA  Take 150 mg by mouth 2 (two) times daily.     spironolactone 50 MG tablet  Commonly known as:  ALDACTONE  Take 50  mg by mouth daily.       No Known Allergies Follow-up Information   Follow up with Gates Rigg, MD. Schedule an appointment as soon as possible for a visit in 1 month. (as for them to schedule a TCD bubble study with emboli monitoring)    Contact information:   [address] [place] [address] [phone]       Schedule an appointment as soon as possible for a visit with FULP, CAMMIE, MD.   Contact information:   [address] [place], [address] [place] [address] [phone]        The results of significant diagnostics from this hospitalization (including imaging, microbiology, ancillary and laboratory) are listed below for reference.    Significant Diagnostic Studies: Mr Thomas Nixon KG Contrast  06/01/2013   *RADIOLOGY REPORT*   Clinical Data:  Left-sided weakness.  Stroke.  Hypertension and diabetes  MRI HEAD WITHOUT CONTRAST MRA HEAD WITHOUT CONTRAST  Technique:  Multiplanar, multiecho pulse sequences of the brain and surrounding structures were obtained without intravenous contrast. Angiographic images of the head were obtained using MRA technique without contrast.  Comparison:  None  MRI HEAD  Findings:  There is considerable hyperintensity in the cerebral white matter bilaterally consistent with chronic microvascular ischemia.  There is a chronic hemorrhagic infarct in the right frontal parietal lobe.  There is a chronic hemorrhagic infarct in the left parietal white matter.  Diffusion weighted imaging shows hyperintensity in the posterior limb internal capsule on the right extending into the cerebral white matter.  This appears to have restricted diffusion consistent with acute or  subacute infarct.  Ventricle size is normal.  No midline shift.  No neoplastic process is identified.  IMPRESSION: The extensive chronic ischemic changes for age consist with diabetes and hypertension.  Chronic hemorrhagic infarcts are present bilaterally.  Acute/subacute infarct in the posterior limb internal capsule and deep white matter in the right frontal parietal lobe.  MRA HEAD  Findings: Vertebral arteries are patent to the basilar without stenosis.  PICA, superior cerebellar, and posterior cerebral arteries are patent bilaterally.  Basilar is widely patent.  Cavernous carotid is widely patent bilaterally.  Anterior and middle cerebral arteries are patent bilaterally without significant stenosis.  Negative for cerebral aneurysm.  IMPRESSION: No significant intracranial stenosis.   Original Report Authenticated By: Janeece Riggers, M.D.   Mr Brain Wo Contrast  06/01/2013   *RADIOLOGY REPORT*  Clinical Data:  Left-sided weakness.  Stroke.  Hypertension and diabetes  MRI HEAD WITHOUT CONTRAST MRA HEAD WITHOUT CONTRAST  Technique:  Multiplanar, multiecho  pulse sequences of the brain and surrounding structures were obtained without intravenous contrast. Angiographic images of the head were obtained using MRA technique without contrast.  Comparison:  None  MRI HEAD  Findings:  There is considerable hyperintensity in the cerebral white matter bilaterally consistent with chronic microvascular ischemia.  There is a chronic hemorrhagic infarct in the right frontal parietal lobe.  There is a chronic hemorrhagic infarct in the left parietal white matter.  Diffusion weighted imaging shows hyperintensity in the posterior limb internal capsule on the right extending into the cerebral white matter.  This appears to have restricted diffusion consistent with acute or  subacute infarct.  Ventricle size is normal.  No midline shift.  No neoplastic process is identified.  IMPRESSION: The extensive chronic ischemic changes for age consist with diabetes and hypertension.  Chronic hemorrhagic infarcts are present bilaterally.  Acute/subacute infarct in the posterior limb internal capsule and  deep white matter in the right frontal parietal lobe.  MRA HEAD  Findings: Vertebral arteries are patent to the basilar without stenosis.  PICA, superior cerebellar, and posterior cerebral arteries are patent bilaterally.  Basilar is widely patent.  Cavernous carotid is widely patent bilaterally.  Anterior and middle cerebral arteries are patent bilaterally without significant stenosis.  Negative for cerebral aneurysm.  IMPRESSION: No significant intracranial stenosis.   Original Report Authenticated By: Janeece Riggers, M.D.   Dg Chest Port 1 View  06/01/2013   *RADIOLOGY REPORT*  Clinical Data: Hypertension.  Right side crackles.  PORTABLE CHEST - 1 VIEW  Comparison: None.  Findings: Elevation of the right hemidiaphragm.  No confluent airspace opacities.  No effusions.  Heart is borderline in size. No acute bony abnormality.  IMPRESSION: No acute cardiopulmonary disease.  Elevation of the right  hemidiaphragm.   Original Report Authenticated By: Charlett Nose, M.D.    Microbiology: No results found for this or any previous visit (from the past 240 hour(s)).   Labs: Basic Metabolic Panel:  Recent Labs Lab 06/01/13 1423  NA 140  K 3.8  CL 103  CO2 24  GLUCOSE 97  BUN 29*  CREATININE 0.96  CALCIUM 9.3   Liver Function Tests:  Recent Labs Lab 06/01/13 1423  AST 26  ALT 23  ALKPHOS 35*  BILITOT 0.5  PROT 7.5  ALBUMIN 4.0   No results found for this basename: LIPASE, AMYLASE,  in the last 168 hours No results found for this basename: AMMONIA,  in the last 168 hours CBC:  Recent Labs Lab 06/01/13 1423  WBC 8.0  NEUTROABS 5.4  HGB 14.0  HCT 41.0  MCV 85.2  PLT 235   Cardiac Enzymes:  Recent Labs Lab 06/01/13 1423  TROPONINI <0.30   BNP: BNP (last 3 results) No results found for this basename: PROBNP,  in the last 8760 hours CBG:  Recent Labs Lab 06/05/13 1119 06/05/13 1621 06/05/13 2158 06/06/13 0703 06/06/13 1148  GLUCAP 104* 134* 101* 119* 91       Signed:  Kanon Colunga K  [facility] 06/06/2013, 3:03 PM

## 2013-06-06 NOTE — Progress Notes (Signed)
[facility] PROGRESS NOTE  Thomas Nixon ZOX:[id number] DOB: 1970/12/16 DOA: 06/01/2013 PCP: Cain Saupe, MD  Assessment/Plan: Recurrent CVA:  - Neurology on board  - For now, cont with plavix  - f/u hypercoag panel - thus far unremarkable  - PT/OT  - CIR consulted - awaiting CIR? DM:  - On metformin w/ SSI  HTN:  - average sbp of 120's - Cont hold bp meds to allow permissive htn  DVT Prophylaxis:  - Lovenox subQ  Code Status: Full Family Communication: Pt in room (indicate person spoken with, relationship, and if by phone, the number) Disposition Plan: Pending placement   Consultants:  Neurology  CIR  Procedures: 2D echo - 06/02/13 - 50-55% no cardiac emboli  Carotid dopplers 06/02/13 - unremarkable  HPI/Subjective: No acute events noted overnight. Pt is without complaints.  Objective: Filed Vitals:   06/05/13 1741 06/05/13 2200 06/06/13 0200 06/06/13 0600  BP: 134/67 131/93 113/50 144/82  Pulse: 67 72 64 87  Temp: 98.3 F (36.8 C) 98.4 F (36.9 C) 99.4 F (37.4 C) 98.9 F (37.2 C)  TempSrc: Oral     Resp: 16 18 18 18   Height:      Weight:      SpO2: 97%  100% 100%   No intake or output data in the 24 hours ending 06/06/13 0801 Filed Weights   06/01/13 2051  Weight: 104.9 kg (231 lb 4.2 oz)    Exam:   General:  Awake, in nad  Cardiovascular: regular, s1, s2  Respiratory: normal resp effort, no wheezing  Abdomen: soft, nondistended, pos BS  Musculoskeletal: perfused, no clubbing or cyanosis   Data Reviewed: Basic Metabolic Panel:  Recent Labs Lab 06/01/13 1423  NA 140  K 3.8  CL 103  CO2 24  GLUCOSE 97  BUN 29*  CREATININE 0.96  CALCIUM 9.3   Liver Function Tests:  Recent Labs Lab 06/01/13 1423  AST 26  ALT 23  ALKPHOS 35*  BILITOT 0.5  PROT 7.5  ALBUMIN 4.0   No results found for this basename: LIPASE, AMYLASE,  in the last 168 hours No results found for this basename: AMMONIA,  in the last 168  hours CBC:  Recent Labs Lab 06/01/13 1423  WBC 8.0  NEUTROABS 5.4  HGB 14.0  HCT 41.0  MCV 85.2  PLT 235   Cardiac Enzymes:  Recent Labs Lab 06/01/13 1423  TROPONINI <0.30   BNP (last 3 results) No results found for this basename: PROBNP,  in the last 8760 hours CBG:  Recent Labs Lab 06/05/13 0651 06/05/13 1119 06/05/13 1621 06/05/13 2158 06/06/13 0703  GLUCAP 116* 104* 134* 101* 119*    No results found for this or any previous visit (from the past 240 hour(s)).   Studies: No results found.  Scheduled Meds: . atorvastatin  40 mg Oral QHS  . clopidogrel  75 mg Oral Q breakfast  . enoxaparin (LOVENOX) injection  50 mg Subcutaneous Q24H  . insulin aspart  0-5 Units Subcutaneous QHS  . insulin aspart  0-9 Units Subcutaneous TID WC  . metFORMIN  1,000 mg Oral BID WC  . pregabalin  150 mg Oral BID   Continuous Infusions: . sodium chloride 75 mL/hr at 06/02/13 2358    Principal Problem:   Stroke Active Problems:   Dysarthria   Hypertension    Time spent:    CHIU, STEPHEN K  [facility] Pager [phone]. If 7PM-7AM, please contact night-coverage at [email or web address], password [email or web address] 06/06/2013, 8:01 AM  LOS: 5 days

## 2013-06-06 NOTE — Interval H&P Note (Signed)
Thomas Nixon was admitted today to Inpatient Rehabilitation with the diagnosis of cva.  The patient's history has been reviewed, patient examined, and there is no change in status.  Patient continues to be appropriate for intensive inpatient rehabilitation.  I have reviewed the patient's chart and labs.  Questions were answered to the patient's satisfaction.  [name] T 06/06/2013, 10:50 PM

## 2013-06-06 NOTE — PMR Pre-admission (Signed)
PMR Admission Coordinator Pre-Admission Assessment  Patient: Thomas Nixon is an 42 y.o., male MRN: [id number] DOB: 20-Feb-1971 Height: 5\' 10"  (177.8 cm) Weight: 104.9 kg (231 lb 4.2 oz)              Insurance Information HMO:      PPO: yes     PCP:       IPA:       80/20:       OTHER:   PRIMARY: [employer]      Policy#: [id number]      Subscriber: pt CM Name: [name] Form      Phone#: [phone]     Fax#: [phone]  Update due 7/22 or after 1st team conf Pre-Cert#: [id number]      Employer: not employed Benefits:  Phone #: [phone]     Name: Nat Christen. 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 right-handed male with history of hypertension, diabetes mellitus with peripheral neuropathy and multiple CVAs with residual left-sided weakness. Patient independent prior to admission living with his wife. By report patient has had full workup in the past for noted CVAs at outside hospitals including [facility], [facility], [facility] and [facility]. Patient on Aggrenox therapy prior to admission for CVA prophylaxis. Admitted 06/01/2013 with left facial droop which developed while patient was on a cruise in [place]. An MRI was completed at outside Hospital results not made available patient requested to come back to [facility]. MRI of the brain showed extensive chronic ischemic changes as well as acute/subacute  infarct in the posterior limb internal capsule and deep white matter in the right frontal parietal lobe. MRA of the head negative for aneurysm no significant intracranial stenosis. Echocardiogram and carotid studies pending. Patient did not receive TPA. Neurology services consulted placed on Plavix for CVA prophylaxis.  Physical and occupational therapy evaluations pending. M.D. is requested physical medicine rehabilitation consult to consider inpatient rehabilitation services  Date of stroke was 05/27/2013.  Prior functional status was independent with dressing bathing and mobility 06/06/13 Stroke w/up completed-ready for CIR Total: 4    Past Medical History  Past Medical History  Diagnosis Date  . Stroke     5 strokes  . Hypertension   . Diabetes mellitus without complication     Family History  family history is not on file.  Prior Rehab/Hospitalizations: yes-several rehab experiences, latest 2009   Current Medications  Current facility-administered medications:0.9 %  sodium chloride infusion, , Intravenous, Continuous, Jerald Kief, MD, Last Rate: 75 mL/hr at 06/02/13 2358;  atorvastatin (LIPITOR) tablet 40 mg, 40 mg, Oral, QHS, Houston Siren, MD, 40 mg at 06/05/13 2137;  clopidogrel (PLAVIX) tablet 75 mg, 75 mg, Oral, Q breakfast, Ulice Dash, PA-C, 75 mg at 06/06/13 0800 enoxaparin (LOVENOX) injection 50 mg, 50 mg, Subcutaneous, Q24H,  Jerald Kief, MD, 50 mg at 06/06/13 1007;  insulin aspart (novoLOG) injection 0-5 Units, 0-5 Units, Subcutaneous, QHS, Houston Siren, MD;  insulin aspart (novoLOG) injection 0-9 Units, 0-9 Units, Subcutaneous, TID WC, Houston Siren, MD, 1 Units at 06/05/13 1658;  metFORMIN (GLUCOPHAGE-XR) 24 hr tablet 1,000 mg, 1,000 mg, Oral, BID WC, Houston Siren, MD, 1,000 mg at 06/06/13 0800 pregabalin (LYRICA) capsule 150 mg, 150 mg, Oral, BID, Houston Siren, MD, 150 mg at 06/06/13 1007  Patients Current Diet: Carb Control  Precautions / Restrictions Precautions Precautions:  Fall Restrictions Weight Bearing Restrictions: No   Prior Activity Level Community (5-7x/wk): was on Honeymoon cruise when he had 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 Pt demonstrates shoulder AROM 3 out 5. hand grasp 3 out 5 gross grasp. Pt with proiception deficits LUE Sensation: decreased proprioception;decreased light touch LUE Coordination: decreased fine motor  Lower Extremity Assessment: Defer to PT evaluation LLE Deficits / Details: good initial muscle contraction however limited ability to maintain during standinga activities    ADLs  Eating/Feeding: Set up Where Assessed - Eating/Feeding: Chair Grooming: Teeth care;Wash/dry face;Minimal assistance;Other (comment) (Left Ue support on sink. leaning against sink for support) Where Assessed - Grooming: Supported standing Upper Body Bathing: Chest;Right arm;Left arm;Abdomen;Minimal assistance Where Assessed - Upper Body Bathing: Supported sitting (requires sitting due to unsafe static standing) Lower Body Bathing: Minimal assistance Where Assessed - Lower Body Bathing: Unsupported sitting (requires sitting due to unsafe static standing) Upper Body Dressing: Minimal assistance Where Assessed - Upper Body Dressing: Unsupported sitting Lower Body Dressing: Minimal assistance Where Assessed - Lower Body Dressing: Supported sitting (needed mod v/c to dress affected side first) Toilet Transfer: Minimal  assistance Toilet Transfer: Patient Percentage: 50% Toilet Transfer Method: Sit to stand Toilet Transfer Equipment: Raised toilet seat with arms (or 3-in-1 over toilet) (cues for hand placement) Toileting - Clothing Manipulation and Hygiene: Moderate assistance Where Assessed - Toileting Clothing Manipulation and Hygiene: Sit to stand from 3-in-1 or toilet Equipment Used: Gait belt;Rolling walker Transfers/Ambulation Related to ADLs: Pt demonstrates Lt LE dragging with ambulation. pt has decr swing through with the left le compared to right LE. pt leaning to the left side and needs facilitation to correct posture. Pt responding to facilitation however needs repetition at CIR to help correct postural left lean.  ADL Comments: Pt finishing with PT on arrival. Pt completed sit<>stand from recliner requiring bil UE to push from arm rest. Pt static standing with slight left lean. Pt given v/c and facilitation for correction. pt progressed to sink level with Lt Le dragging. pt static standing at sink with LT Ue supported on sink surface. Pt with support from therapist to reach for objects that challenge dynamic static standing with reach . Pt requires UE support and therapist for safety. Pt with progressive lt knee flexion in static standing due to fatigue. pt required return to sitting on 3n1 due to left le fatigue s/p washing face and pushing teeth terminating prematurely. Pt finished oral care in sitting. pt static standing to fill basin, apply soap and baby powder  to water as precursor to bathing. pt required return to sitting x2 due to left le fatigue. Pt completed all bathing in sitting. pt educated on dressing Lt LE first to incr independence. Pt required extended time and effort. Pt understands the sequnce but requires repetition to incr independence with task. Strong CIR candidate at this time     Mobility  Bed Mobility: Supine to Sit;Sitting - Scoot to Edge of Bed Supine to Sit: 4: Min guard;HOB  elevated;With rails Sitting - Scoot to Edge of Bed: 5: Supervision;With rail    Transfers  Transfers: Sit to Stand;Stand to Sit Sit to Stand: 4: Min assist;4: Min guard Stand to Sit: 4: Min assist;4: Min Producer, television/film/video Transfers: 1: +2 Total assist Stand Pivot Transfers: Patient Percentage: 50%    Ambulation / Gait / Stairs / Psychologist, prison and probation services  Ambulation/Gait Ambulation/Gait Assistance: 4: Min assist;3: Mod assist Ambulation/Gait: Patient Percentage: 60% Ambulation Distance (Feet): 30 Feet (x2) Assistive device: None Ambulation/Gait Assistance Details: verbal cues as well as tactile facilitation for proper weight shift (specifically to the right) to account for adequate foot clearance and larger step on the left, responds very well to verbal sequencing cues; very short shuffled steps with decreased trunk rotation Gait Pattern: Decreased step length - left;Decreased step length - right;Decreased weight shift to right;Decreased weight shift to left;Decreased stride length;Left genu recurvatum;Narrow base of support;Left foot flat Gait velocity: decreased; unable to safely increase speed at this time due to high level of concentration from patient needing to focus on safe sequencing General Gait Details: decreased step height bilaterally Stairs: No Wheelchair Mobility Wheelchair Mobility: No    Posture / Balance Static Standing Balance Static Standing - Balance Support: No upper extremity supported Static Standing - Level of Assistance: 4: Min assist (mingaurdA) Static Standing - Comment/# of Minutes: facilitation for eccetric control of left knee to prevent hyperextension, again as he fatigues he drifts with increasing knee flexion High Level Balance High Level Balance Activites: Backward walking;Head turns High Level Balance Comments: attempted head turns without success today, pt unable to perform dual task activities at this time due to limited ability shift his focus away from  adequate step length and weight shift for foot clearance    Special needs/care consideration Pt is dysarthric Skin: no problems noted Bowel mgmt: LBM 7/15 per pt Bladder mgmt: continent-I w/ urinal Diabetic mgmt: yes     Previous Home Environment Living Arrangements: Spouse/significant other Available Help at Discharge: Family Type of Home:  (town home) Home Layout: Two level Home Access: Stairs to enter Entrance Stairs-Rails: Right Entrance Stairs-Number of Steps: 5-6 Bathroom Shower/Tub: Tub/shower unit;Curtain Firefighter: Standard Home Care Services: No Additional Comments: newly weds married 2 weeks  Discharge Living Setting Plans for Discharge Living Setting: Patient's home Type of Home at Discharge: Other (Comment) (Townhouse) Discharge Home Layout: Two level (can stay 1st floor, but only 1/2 bath) Alternate Level Stairs-Rails: Right Alternate Level Stairs-Number of Steps: @ 9 steps per pt Discharge Home Access: Stairs to enter Entrance Stairs-Rails: Right Entrance Stairs-Number of Steps: @ 5 STE Does the patient have any problems obtaining your medications?: No  Social/Family/Support Systems Patient Roles: Spouse Contact Information: [phone] Anticipated Caregiver: wife, [name] Anticipated Caregiver's Contact Information: wife's cell # [phone] Ability/Limitations of Caregiver: Min A Caregiver Availability: Intermittent (wife works-their parents may also assist) Discharge Plan Discussed with Primary Caregiver: Yes Is Caregiver In Agreement with Plan?: Yes Does Caregiver/Family have Issues with Lodging/Transportation while Pt is in Rehab?: No  Goals/Additional Needs Patient/Family Goal for Rehab: Mod I -S Expected length of stay: @ 2 weeks Pt/Family Agrees to Admission and willing to participate: Yes Program Orientation Provided & Reviewed with Pt/Caregiver Including Roles  & Responsibilities: Yes   Decrease burden of Care through IP rehab  admission: Specialzed equipment needs and Patient/family education   Possible need for SNF placement upon discharge: not expected   Patient Condition: This patient's medical and functional status has changed since the consult dated: 06/02/13 in which the Rehabilitation Physician determined and documented that the patient's condition is appropriate for intensive rehabilitative care in an inpatient rehabilitation facility. See "History of Present Illness" (above) for medical update. Functional changes are: Min-Mod A 30' ambulation; Min-Mod A ADLs. Patient's medical and functional status update has been discussed with the Rehabilitation physician and patient remains appropriate for inpatient rehabilitation. Will admit to inpatient rehab today.  Preadmission Screen Completed By:  Brock Ra, 06/06/2013 4:15 PM ______________________________________________________________________   Discussed status with Dr. Riley Kill on 06/06/13 at 4:14pm and received telephone approval for admission today.  Admission Coordinator:  Brock Ra, time 4:14pm/Date 06/06/13

## 2013-06-07 ENCOUNTER — Inpatient Hospital Stay (HOSPITAL_COMMUNITY): Payer: Medicare PPO | Admitting: *Deleted

## 2013-06-07 ENCOUNTER — Inpatient Hospital Stay (HOSPITAL_COMMUNITY): Payer: Medicare PPO | Admitting: Speech Pathology

## 2013-06-07 ENCOUNTER — Inpatient Hospital Stay (HOSPITAL_COMMUNITY): Payer: Medicare PPO | Admitting: Occupational Therapy

## 2013-06-07 DIAGNOSIS — I6330 Cerebral infarction due to thrombosis of unspecified cerebral artery: Secondary | ICD-10-CM

## 2013-06-07 DIAGNOSIS — I10 Essential (primary) hypertension: Secondary | ICD-10-CM

## 2013-06-07 LAB — CBC WITH DIFFERENTIAL/PLATELET
Basophils Absolute: 0 10*3/uL (ref 0.0–0.1)
Basophils Relative: 0 % (ref 0–1)
Eosinophils Absolute: 0.2 10*3/uL (ref 0.0–0.7)
Eosinophils Relative: 3 % (ref 0–5)
HCT: 38.8 % — ABNORMAL LOW (ref 39.0–52.0)
Hemoglobin: 13.1 g/dL (ref 13.0–17.0)
Lymphocytes Relative: 24 % (ref 12–46)
Lymphs Abs: 2 10*3/uL (ref 0.7–4.0)
MCH: 28.8 pg (ref 26.0–34.0)
MCHC: 33.8 g/dL (ref 30.0–36.0)
MCV: 85.3 fL (ref 78.0–100.0)
Monocytes Absolute: 0.7 10*3/uL (ref 0.1–1.0)
Monocytes Relative: 9 % (ref 3–12)
Neutro Abs: 5.3 10*3/uL (ref 1.7–7.7)
Neutrophils Relative %: 65 % (ref 43–77)
Platelets: 260 10*3/uL (ref 150–400)
RBC: 4.55 MIL/uL (ref 4.22–5.81)
RDW: 12.8 % (ref 11.5–15.5)
WBC: 8.2 10*3/uL (ref 4.0–10.5)

## 2013-06-07 LAB — COMPREHENSIVE METABOLIC PANEL
ALT: 31 U/L (ref 0–53)
AST: 21 U/L (ref 0–37)
Albumin: 3.4 g/dL — ABNORMAL LOW (ref 3.5–5.2)
Alkaline Phosphatase: 36 U/L — ABNORMAL LOW (ref 39–117)
BUN: 27 mg/dL — ABNORMAL HIGH (ref 6–23)
CO2: 26 mEq/L (ref 19–32)
Calcium: 8.8 mg/dL (ref 8.4–10.5)
Chloride: 105 mEq/L (ref 96–112)
Creatinine, Ser: 1.06 mg/dL (ref 0.50–1.35)
GFR calc Af Amer: >90 mL/min (ref >90)
GFR calc non Af Amer: 87 mL/min — ABNORMAL LOW (ref >90)
Glucose, Bld: 99 mg/dL (ref 70–99)
Potassium: 3.9 mEq/L (ref 3.5–5.1)
Sodium: 140 mEq/L (ref 135–145)
Total Bilirubin: 0.2 mg/dL — ABNORMAL LOW (ref 0.3–1.2)
Total Protein: 6.9 g/dL (ref 6.0–8.3)

## 2013-06-07 LAB — GLUCOSE, CAPILLARY
Glucose-Capillary: 107 mg/dL — ABNORMAL HIGH (ref 70–99)
Glucose-Capillary: 87 mg/dL (ref 70–99)
Glucose-Capillary: 128 mg/dL — ABNORMAL HIGH (ref 70–99)
Glucose-Capillary: 118 mg/dL — ABNORMAL HIGH (ref 70–99)

## 2013-06-07 NOTE — Progress Notes (Signed)
Social Work Assessment and Plan Social Work Assessment and Plan  Patient Details  Name: Thomas Nixon MRN: [id number] Date of Birth: 27-Apr-1971  Today's Date: 06/07/2013  Problem List:  Patient Active Problem List   Diagnosis Date Noted  . 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 Stroke 06/01/2013  . Dysarthria 06/01/2013  . Hypertension 06/01/2013   Past Medical History:  Past Medical History  Diagnosis Date  . Stroke     5 strokes  . Hypertension   . Diabetes mellitus without complication    Past Surgical History: No past surgical history on file. Social History:  reports that he has never smoked. He does not have any smokeless tobacco history on file. He reports that  drinks alcohol. He reports that he does not use illicit drugs.  Family / Support Systems Marital Status: Married How Long?: 2 weeks Patient Roles: Spouse;Volunteer Spouse/Significant Other: Thomas Nixon  [phone] Other Supports: Pt's mom Anticipated Caregiver: Wife and pt Ability/Limitations of Caregiver: Wife works during the day Caregiver Availability: Other (Comment) (can arrange 24 hr care if needed) Family Dynamics: Close knit family his parents are involved.  He is a newly wed-wife is a Publishing copy.    Social History Preferred language: English Religion:  Cultural Background: No issues Education: Some college Read: Yes Write: Yes Employment Status: Disabled Fish farm manager Issues: No issues Guardian/Conservator: None-according to MD pt is capable of making his own decisions   Abuse/Neglect Physical Abuse: Denies Verbal Abuse: Denies Sexual Abuse: Denies Exploitation of patient/patient's resources: Denies Self-Neglect: Denies  Emotional Status Pt's affect, behavior adn adjustment status: Pt is motivated and pleased with the progress he has made so far.  He is glad to be here on the rehab unit.  He feels it will be very beneficial for him  and will have the opportunity to make the most gains. Recent Psychosocial Issues: Other medical issues-history of multiple strokes Pyschiatric History: No history-deferred depression screen due to pt felt not necessary and feels he is coping approrpriately with his stroke.  Will continue to monitor his coping while here. Substance Abuse History: No issues  Patient / Family Perceptions, Expectations & Goals Pt/Family understanding of illness & functional limitations: Pt is able to explain his stroke and the deficits he has has.  He is hopeful he will improve like he has in the past.  He has been all over trying to find the reason he is prone to strokes but has no definite answer yet.  So he takes each day as a gift. Premorbid pt/family roles/activities: Husband, Son, Retiree, Church member etc Anticipated changes in roles/activities/participation: resume Pt/family expectations/goals: Pt states: " I want to be able to take care of myself and not have to rely upon others."  He should be able to reach this goal he is high level.  Community Resources [employer]: Other (Comment) (Had in past) Premorbid Home Care/DME Agencies: Other (Comment) (Had in past) Transportation available at discharge: Family Resource referrals recommended: Support group (specify) (CVA Support group)  Discharge Planning Living Arrangements: Spouse/significant other Support Systems: Spouse/significant other;Parent;Other relatives;Friends/neighbors;Church/faith community Type of Residence: Private residence Insurance Resources: Media planner (specify) ([employer] Medicare) Financial Resources: SSD;Family Support Financial Screen Referred: No Living Expenses: Lives with family Money Management: Patient;Spouse Does the patient have any problems obtaining your medications?: No Home Management: Both Patient/Family Preliminary Plans: Return home with wife who is there in the evenings,his Mom could assist if necessary.   Awaiting team's evaluations regarding goals. Social Work  Anticipated Follow Up Needs: HH/OP;Support Group  Clinical Impression Very motivated gentleman who is wiling to work hard to recover, he has been through this several times before and  continues to be motivated. Supportive family who are willing to assist.  Pt is fairly high level will not be here long.  Lucy Chris 06/07/2013, 2:59 PM

## 2013-06-07 NOTE — Evaluation (Signed)
Speech Language Pathology Assessment and Plan  Patient Details  Name: Thomas Nixon MRN: [id number] Date of Birth: 08-Oct-1971  SLP Diagnosis: Dysarthria;Speech and Language deficits  Rehab Potential: Excellent ELOS: 1 week   Today's Date: 06/07/2013 Time: 8295-6213 Time Calculation (min): 55 min  Problem List:  Patient Active Problem List   Diagnosis Date Noted  . 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 Stroke 06/01/2013  . Dysarthria 06/01/2013  . Hypertension 06/01/2013   Past Medical History:  Past Medical History  Diagnosis Date  . Stroke     5 strokes  . Hypertension   . Diabetes mellitus without complication    Past Surgical History: No past surgical history on file.  Assessment / Plan / Recommendation Clinical Impression  Thomas Nixon is a 42 y.o. right-handed male with history of hypertension, diabetes mellitus with peripheral neuropathy and multiple CVAs with residual left-sided weakness maintained on Aggrenox. Patient independent prior to admission living with his wife. By report patient has had full workup in the past for noted CVAs at outside hospitals including [facility], [facility], [facility] and [facility]. Patient on Aggrenox therapy prior to admission for CVA prophylaxis. Admitted 06/01/2013 with left facial droop which developed while patient was on a cruise in [place]. An MRI was completed at outside Hospital results not made available patient requested to come back to [facility] for ongoing care. MRI of the brain showed extensive chronic ischemic changes as well as acute/subacute infarct in the posterior limb internal capsule and deep white matter in the right frontal parietal lobe. MRA of the head negative for aneurysm no significant intracranial stenosis. Echocardiogram with ejection fraction of 55% no wall motion abnormalities. Carotid Dopplers with less than 39% ICA stenosis. Patient did not receive TPA. Neurology services consulted placed on Plavix for  CVA prophylaxis. Subcutaneous Lovenox added for DVT prophylaxis. Physical and occupational therapy evaluations completed an ongoing with recommendations of physical medicine rehabilitation consult to consider inpatient rehabilitation services. Patient was felt to be a good candidate for inpatient rehabilitation services and was admitted for comprehensive rehabilitation program 06/06/13.  Cognitive-Linguistic Evaluation revealed moderate dysarthria due to oral weakness, resulting in imprecise articulation which is compounded by a rapid rate of speech.  Patient also demonstrates intermittent word finding/thought organization difficulty.  Patient Mod I with se of safe swallow compensatory strategies following acute care therapy.  As a result, it is recommended that this patient receive skilled SLP services to address these deficits to maximize functional independence with communication prior to discharge home.      SLP initiated skilled treatment with education regarding diaphragmatic breathing and speech intelligibility strategies.     SLP Assessment  Patient will need skilled Speech Lanaguage Pathology Services during CIR admission    Recommendations  Oral Care Recommendations: Oral care BID Patient destination: Home Follow up Recommendations: Outpatient SLP Equipment Recommended: None recommended by SLP    SLP Frequency 5 out of 7 days   SLP Treatment/Interventions Cueing hierarchy;Environmental controls;Functional tasks;Internal/external aids;Oral motor exercises;Patient/family education;Speech/Language facilitation;Therapeutic Activities    Pain Pain Assessment Pain Assessment: No/denies pain Prior Functioning Cognitive/Linguistic Baseline: Within functional limits (suspect baseline) Type of Home: Apartment [employer])  Lives With: Spouse Available Help at Discharge: Family;Available 24 hours/day (wife works, pt's mother avail 24/7/PRN) Vocation: On disability  Short Term Goals: Week 1:  SLP Short Term Goal 1 (Week 1): Patient will identify effective speech intelligibility strategies with Mod I  SLP Short Term Goal 2 (Week 1): Patient will utilize speech intelligibility strategies during  structured tasks with Mod I  SLP Short Term Goal 3 (Week 1): Patient will utilize speech intelligibility strategies in unstructured tasks with Supervison level verbal cues.  See FIM for current functional status Refer to Care Plan for Long Term Goals  Recommendations for other services: None  Discharge Criteria: Patient will be discharged from SLP if patient refuses treatment 3 consecutive times without medical reason, if treatment goals not met, if there is a change in medical status, if patient makes no progress towards goals or if patient is discharged from hospital.  The above assessment, treatment plan, treatment alternatives and goals were discussed and mutually agreed upon: by patient  Fae Pippin, M.A., CCC-SLP [phone]  Nainoa Woldt 06/07/2013, 11:03 AM

## 2013-06-07 NOTE — Progress Notes (Signed)
Patient information reviewed and entered into eRehab system by Sobia Karger, RN, CRRN, PPS Coordinator.  Information including medical coding and functional independence measure will be reviewed and updated through discharge.     Per nursing patient was given "Data Collection Information Summary for Patients in Inpatient Rehabilitation Facilities with attached "Privacy Act Statement-Health Care Records" upon admission.  

## 2013-06-07 NOTE — Evaluation (Signed)
Physical Therapy Assessment and Plan  Patient Details  Name: Thomas Nixon MRN: [id number] Date of Birth: 1971/10/19  PT Diagnosis: Abnormal posture, Abnormality of gait, Coordination disorder, Hemiparesis non-dominant and Muscle weakness Rehab Potential: Excellent ELOS: 7-10 days   Today's Date: 06/07/2013 Time: 1102-1200 Time Calculation (min): 58 min  Problem List:  Patient Active Problem List   Diagnosis Date Noted  . 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 Stroke 06/01/2013  . Dysarthria 06/01/2013  . Hypertension 06/01/2013    Past Medical History:  Past Medical History  Diagnosis Date  . Stroke     5 strokes  . Hypertension   . Diabetes mellitus without complication    Past Surgical History: No past surgical history on file.  Assessment & Plan Clinical Impression: Thomas Nixon is a 42 y.o. right-handed male with history of hypertension, diabetes mellitus with peripheral neuropathy and multiple CVAs with residual left-sided weakness maintained on Aggrenox. Patient independent prior to admission living with his wife. By report patient has had full workup in the past for noted CVAs at outside hospitals including [facility], [facility], [facility] and [facility]. Patient on Aggrenox therapy prior to admission for CVA prophylaxis. Admitted 06/01/2013 with left facial droop which developed while patient was on a cruise in [place]. An MRI was completed at outside Hospital results not made available patient requested to come back to [facility] for ongoing care. MRI of the brain showed extensive chronic ischemic changes as well as acute/subacute infarct in the posterior limb internal capsule and deep white matter in the right frontal parietal lobe. MRA of the head negative for aneurysm no significant intracranial stenosis. Echocardiogram with ejection fraction of 55% no wall motion abnormalities. Carotid Dopplers with less than 39% ICA stenosis. Patient did not receive TPA.  Neurology services consulted placed on Plavix for CVA prophylaxis. Subcutaneous Lovenox added for DVT prophylaxis. Physical and occupational therapy evaluations completed an ongoing with recommendations of physical medicine rehabilitation consult to consider inpatient rehabilitation services. Patient was felt to be a good candidate for inpatient rehabilitation services and was admitted for comprehensive rehabilitation program. Patient transferred to CIR on 06/06/2013 .   Patient currently requires min with mobility secondary to muscle weakness, decreased cardiorespiratoy endurance, impaired timing and sequencing, unbalanced muscle activation and decreased coordination, and decreased standing balance, decreased postural control and decreased balance strategies.  Prior to hospitalization, patient was independent  with mobility and lived with Spouse in a Apartment (townhome) home.  Home access is 4-5Stairs to enter.  Patient will benefit from skilled PT intervention to maximize safe functional mobility, minimize fall risk and decrease caregiver burden for planned discharge home with intermittent assist.  Anticipate patient will benefit from follow up OP at discharge.  PT - End of Session Activity Tolerance: Endurance does not limit participation in activity PT Assessment Rehab Potential: Excellent PT Plan PT Intensity: Minimum of 1-2 x/day ,45 to 90 minutes PT Frequency: 5 out of 7 days PT Duration Estimated Length of Stay: 7-10 days PT Treatment/Interventions: Ambulation/gait training;Balance/vestibular training;Cognitive remediation/compensation;Community reintegration;Discharge planning;Neuromuscular re-education;Functional mobility training;DME/adaptive equipment instruction;Pain management;Patient/family education;Psychosocial support;Splinting/orthotics;UE/LE Coordination activities;UE/LE Strength taining/ROM;Therapeutic Exercise;Therapeutic Activities;Stair training;Wheelchair  propulsion/positioning;Disease management/prevention PT Recommendation Recommendations for Other Services: Speech consult Follow Up Recommendations: Outpatient PT Patient destination: Home Equipment Recommended: None recommended by PT Equipment Details: DME assessment ongoing, recommendations TBD upon discharge  Skilled Therapeutic Intervention Skilled therapeutic intervention initiated after completion of evaluation. Patient performed quadruped with R UE reaching to facilitate increased weight bearing through L UE. Patient able to  progress to forward/retro crawling on mat. In prone, patient performed prone on elbows progressing to planking on elbows. Patient performed 3 planks, lasting 5-12" in length. Patient requires verbal cues to no hold breath. Patient returned to room and left seated edge of bed with all needs within reach and nurse tech present.  PT Evaluation Precautions/Restrictions Precautions Precautions: Fall Restrictions Weight Bearing Restrictions: No General Chart Reviewed: Yes Family/Caregiver Present: No  Pain Pain Assessment Pain Assessment: No/denies pain Pain Score: 0-No pain Home Living/Prior Functioning Home Living Available Help at Discharge: Family;Available 24 hours/day (Pt's mother available 24/7 if needed) Type of Home: Apartment (townhome) Home Access: Stairs to enter [employer] of Steps: 4-5 Entrance Stairs-Rails: Left Home Layout: Two level;Able to live on main level with bedroom/bathroom;1/2 bath on main level Alternate Level Stairs-Number of Steps: flight Alternate Level Stairs-Rails: Left;Right Additional Comments: newlyweds-married 05/19/13  Lives With: Spouse Prior Function Level of Independence: Independent with basic ADLs;Independent with homemaking with ambulation;Independent with gait;Independent with transfers  Able to Take Stairs?: Reciprically Driving: Yes Vocation: On disability Comments: does a LOT of walking and likes  to play with his dog.  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 Vision/Perception  Vision - History Baseline Vision: Wears glasses only for reading Patient Visual Report: No change from baseline  Cognition Overall Cognitive Status: History of cognitive impairments - at baseline Arousal/Alertness: Awake/alert Orientation Level: Oriented X4 Awareness: Appears intact Safety/Judgment: Appears intact Sensation Sensation Light Touch: Appears Intact Stereognosis: Not tested Hot/Cold: Appears Intact (UEs) Proprioception: Appears Intact Additional Comments: Proprioception intact at B ankles and great toes. Coordination Gross Motor Movements are Fluid and Coordinated: Yes Fine Motor Movements are Fluid and Coordinated: No Coordination and Movement Description: LUE with decreased speed and accuracy with rapid alternating movements Heel Shin Test: No dysmetria noted B LE Motor  Motor Motor: Within Functional Limits Motor - Skilled Clinical Observations: mild hemiparesis L side (residual from past CVAs)  Mobility Bed Mobility Bed Mobility: Supine to Sit;Sit to Supine;Sitting - Scoot to Edge of Bed Supine to Sit: HOB flat;6: Modified independent (Device/Increase time) Sitting - Scoot to Edge of Bed: 6: Modified independent (Device/Increase time) Sit to Supine: 6: Modified independent (Device/Increase time);HOB flat Transfers Sit to Stand: 4: Min assist;4: Min guard;From bed;From chair/3-in-1;With upper extremity assist;Without upper extremity assist Sit to Stand Details: Verbal cues for precautions/safety Stand to Sit: 4: Min guard;To bed;To chair/3-in-1;Without upper extremity assist;With upper extremity assist Stand Pivot Transfers: 4: Min assist Stand Pivot Transfer Details: Verbal cues for precautions/safety Locomotion  Ambulation Ambulation: Yes Ambulation/Gait Assistance: 4: Min assist Ambulation Distance (Feet): 172  Feet Assistive device: 1 person hand held assist;None;Other (Comment) (L HHA progressing to no AD) Ambulation/Gait Assistance Details: Verbal cues for precautions/safety;Verbal cues for gait pattern;Manual facilitation for weight shifting;Tactile cues for posture Ambulation/Gait Assistance Details: Patient instructed in gait training 172' x2 with L HHA progressing to no AD. Patient requires manual facilitation for lateral weight shifts and verbal cues to increase L step length and increase gait speed. Patient demonstrates improved step and stride length when cued to increase gait speed. Gait Gait: Yes Gait Pattern: Impaired Gait Pattern: Decreased step length - left;Decreased weight shift to right;Decreased stride length;Decreased dorsiflexion - left;Wide base of support;Decreased stance time - right Stairs / Additional Locomotion Stairs: Yes Stairs Assistance: 4: Min assist Stairs Assistance Details: Verbal cues for precautions/safety Stairs Assistance Details (indicate cue type and reason): Step to ascending, alternating descending  Stair Management Technique: One rail Left;Alternating pattern;Step to pattern;Forwards Number of Stairs: 10 Height of Stairs: 6 Wheelchair Mobility Wheelchair Mobility: No  Trunk/Postural Assessment  Cervical Assessment Cervical Assessment: Within Functional Limits Thoracic Assessment Thoracic Assessment: Within Functional Limits Lumbar Assessment Lumbar Assessment: Within Functional Limits Postural Control Postural Control: Deficits on evaluation Postural Limitations: L lateral shift in standing  Balance Balance Balance Assessed: Yes Standardized Balance Assessment Standardized Balance Assessment: Berg Balance Test Berg Balance Test Sit to Stand: Able to stand without using hands and stabilize independently Standing Unsupported: Able to stand 2 minutes with supervision Sitting with Back Unsupported but Feet Supported on Floor or Stool: Able to sit  safely and securely 2 minutes Stand to Sit: Sits safely with minimal use of hands Transfers: Able to transfer safely, definite need of hands Standing Unsupported with Eyes Closed: Able to stand 10 seconds with supervision Standing Ubsupported with Feet Together: Able to place feet together independently and stand for 1 minute with supervision From Standing, Reach Forward with Outstretched Arm: Can reach forward >12 cm safely (5") From Standing Position, Pick up Object from Floor: Able to pick up shoe, needs supervision From Standing Position, Turn to Look Behind Over each Shoulder: Looks behind one side only/other side shows less weight shift Turn 360 Degrees: Needs close supervision or verbal cueing Standing Unsupported, Alternately Place Feet on Step/Stool: Able to complete >2 steps/needs minimal assist Standing Unsupported, One Foot in Front: Able to take small step independently and hold 30 seconds Standing on One Leg: Able to lift leg independently and hold equal to or more than 3 seconds Total Score: 39/56, indicating patient is at a significant risk for falls (>80%). Static Standing Balance Static Standing - Balance Support: No upper extremity supported Static Standing - Level of Assistance: 4: Min assist Extremity Assessment  RLE Assessment RLE Assessment: Within Functional Limits LLE Assessment LLE Assessment: Within Functional Limits (no noted strength deficits on L LE)  FIM:  FIM - Bed/Chair Transfer Bed/Chair Transfer: 4: Bed > Chair or W/C: Min A (steadying Pt. > 75%);4: Chair or W/C > Bed: Min A (steadying Pt. > 75%);6: Supine > Sit: No assist;6: Sit > Supine: No assist FIM - Locomotion: Wheelchair Locomotion: Wheelchair: 0: Activity did not occur FIM - Locomotion: Ambulation Ambulation/Gait Assistance: 4: Min assist Locomotion: Ambulation: 4: Travels 150 ft or more with minimal assistance (Pt.>75%) FIM - Locomotion: Stairs Locomotion: Building control surveyor: Hand rail  - 1 Locomotion: Stairs: 2: Up and Down 4 - 11 stairs with minimal assistance (Pt.>75%)   Refer to Care Plan for Long Term Goals  Recommendations for other services: None  Discharge Criteria: Patient will be discharged from PT if patient refuses treatment 3 consecutive times without medical reason, if treatment goals not met, if there is a change in medical status, if patient makes no progress towards goals or if patient is discharged from hospital.  The above assessment, treatment plan, treatment alternatives and goals were discussed and mutually agreed upon: by patient  Chipper Herb. Adonus Uselman, PT, DPT 06/07/2013, 12:16 PM

## 2013-06-07 NOTE — Care Management Note (Signed)
[facility] Individual Statement of Services  Patient Name:  Thomas Nixon  Date:  06/07/2013  Welcome to the [facility].  Our goal is to provide you with an individualized program based on your diagnosis and situation, designed to meet your specific needs.  With this comprehensive rehabilitation program, you will be expected to participate in at least 3 hours of rehabilitation therapies Monday-Friday, with modified therapy programming on the weekends.  Your rehabilitation program will include the following services:  Physical Therapy (PT), Occupational Therapy (OT), Speech Therapy (ST), 24 hour per day rehabilitation nursing, Case Management ( Social Worker), Rehabilitation Medicine, Nutrition Services and Pharmacy Services  Weekly team conferences will be held on Wednesday to discuss your progress.  Your Social Worker will talk with you frequently to get your input and to update you on team discussions.  Team conferences with you and your family in attendance may also be held.  Expected length of stay: 7 days  Overall anticipated outcome: mod/i level  Depending on your progress and recovery, your program may change. Your Social Worker will coordinate services and will keep you informed of any changes. Your Child psychotherapist names and contact numbers are listed  below.  The following services may also be recommended but are not provided by the Inpatient [facility]:   Driving Evaluations  Home Health Rehabiltiation Services  Outpatient Rehabilitatation Servives   Arrangements will be made to provide these services after discharge if needed.  Arrangements include referral to agencies that provide these services.  Your insurance has been verified to be:  [employer] Your primary doctor is:  Dr Hewitt Shorts Fulp  Pertinent information will be shared with your doctor and your insurance company.  Social Worker:  Dossie Der, [place]  [phone]  Information discussed with and copy given to patient by: Lucy Chris, 06/07/2013, 2:30 PM

## 2013-06-07 NOTE — Progress Notes (Addendum)
Subjective/Complaints: Had a great night. Slept well. Denies pain or any problems.  A 12 point review of systems has been performed and if not noted above is otherwise negative.   Objective: Vital Signs: Blood pressure 107/72, pulse 58, temperature 98.2 F (36.8 C), temperature source Oral, resp. rate 18, height 5\' 10"  (1.778 m), weight 105.2 kg (231 lb 14.8 oz), SpO2 97.00%. No results found.  Recent Labs  06/06/13 1954 06/07/13 0615  WBC 8.8 8.2  HGB 14.5 13.1  HCT 41.8 38.8*  PLT 277 260    Recent Labs  06/06/13 1954 06/07/13 0615  NA  --  140  K  --  3.9  CL  --  105  GLUCOSE  --  99  BUN  --  27*  CREATININE 1.10 1.06  CALCIUM  --  8.8   CBG (last 3)   Recent Labs  06/06/13 1148 06/06/13 1645 06/06/13 2124  GLUCAP 91 116* 133*    Wt Readings from Last 3 Encounters:  06/06/13 105.2 kg (231 lb 14.8 oz)  06/01/13 104.9 kg (231 lb 4.2 oz)    Physical Exam:  Constitutional: He is oriented to person, place, and time.  HENT: dentition good. Oral mucosa pink and moist  Head: Normocephalic.  Eyes: EOM are normal.  Neck: Normal range of motion. Neck supple. No thyromegaly present.  Cardiovascular: Normal rate and regular rhythm. No murmurs  Pulmonary/Chest: Effort normal and breath sounds normal. No respiratory distress. No rales, wheezes  Abdominal: Soft. Bowel sounds are normal. He exhibits distension.  Neurological: He is alert and oriented to person, place, and time.  Patient follows three-step command  Skin: Skin is warm and dry.  motor strength 4 /5 in the left deltoid, bicep, tricep, grip. 4/5 left hip flexor, knee extensors, ankle dorsi flexion plantar flexor 5/5 in the right side. Left pronator drift  Sensory exam is normal to light touch and pinprick in all 4 limbs.  Speech is dysarthric but intellgibile. Left central 7 and tongue deviation.  Visual fields are intact confrontation testing  Cerebellar testing shows ataxia finger nose to finger in  the left upper    Assessment/Plan: 1. Functional deficits secondary to thrombotic right PLIC infarct, hx of prior CVA's which require 3+ hours per day of interdisciplinary therapy in a comprehensive inpatient rehab setting. Physiatrist is providing close team supervision and 24 hour management of active medical problems listed below. Physiatrist and rehab team continue to assess barriers to discharge/monitor patient progress toward functional and medical goals. FIM:                   Comprehension Comprehension Mode: Auditory Comprehension: 5-Follows basic conversation/direction: With no assist  Expression Expression Mode: Verbal Expression: 5-Expresses basic needs/ideas: With no assist  Social Interaction Social Interaction: 5-Interacts appropriately 90% of the time - Needs monitoring or encouragement for participation or interaction.        Medical Problem List and Plan:  1. 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 DVT Prophylaxis/Anticoagulation: Subcutaneous Lovenox. Monitor platelet counts and any signs of bleeding  3. Pain Management: Tylenol as needed.  4. Neuropsych: This patient is capable of making decisions on his own behalf.  5. Diabetes mellitus with peripheral neuropathy. Hemoglobin A1c 6.2. Glucophage 1000 mg twice a day. Check blood sugars a.c. and at bedtime.  6. Hyperlipidemia. Lipitor  7. Hypertension. No present antihypertensive medications. Patient on Aldactone 50 mg daily and  Norvasc 5 mg daily prior to admission. Normotensive at present 8. FEN: push po fluids  LOS (Days) 1 A FACE TO FACE EVALUATION WAS PERFORMED  SWARTZ,ZACHARY T 06/07/2013 9:36 AM

## 2013-06-07 NOTE — Progress Notes (Signed)
Occupational Therapy Session Note  Patient Details  Name: Thomas Nixon MRN: [id number] Date of Birth: December 05, 1970  Today's Date: 06/07/2013 Time: 1333-1400 Time Calculation (min): 27 min  Short Term Goals: No short term goals set  Skilled Therapeutic Interventions/Progress Updates:    Pt seen for 1:1 OT with focus on visual assessment and LUE FMC and GMC tasks.  Pt in bed upon arrival, completed stand pivot transfer to w/c with min/steady assist.  Visual assessment conducted with no visual deficits noted.  Completed 9 hole peg test with Rt: 36 seconds and Lt: 54 seconds.  Noted slight decrease in [facility] with pt with difficulty manipulating small pegs.  PNF pattern reaching and trunk rotation with 4# medicine ball in standing with focus on trunk control and LUE GMC.  Therapy Documentation Precautions:  Precautions Precautions: Fall Restrictions Weight Bearing Restrictions: No General:   Vital Signs: Therapy Vitals Temp: 97.5 F (36.4 C) Temp src: Oral Pulse Rate: 87 Resp: 18 BP: 115/81 mmHg Patient Position, if appropriate: Sitting Oxygen Therapy SpO2: 96 % O2 Device: None (Room air) Pain: Pain Assessment Pain Assessment: No/denies pain Pain Score: 0-No pain  See FIM for current functional status  Therapy/Group: Individual Therapy  Rosalio Loud 06/07/2013, 2:41 PM

## 2013-06-07 NOTE — Evaluation (Signed)
Occupational Therapy Assessment and Plan  Patient Details  Name: Thomas Nixon MRN: [id number] Date of Birth: 17-Apr-1971  OT Diagnosis: abnormal posture, ataxia, question of possible premorbid cognitive deficits, hemiplegia affecting non-dominant side and muscle weakness (generalized) Rehab Potential: Rehab Potential: Good ELOS: 7-10 days   Today's Date: 06/07/2013 Time: 0800-0900 Time Calculation (min): 60 min  Problem List:  Patient Active Problem List   Diagnosis Date Noted  . 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 Stroke 06/01/2013  . Dysarthria 06/01/2013  . Hypertension 06/01/2013    Past Medical History:  Past Medical History  Diagnosis Date  . Stroke     5 strokes  . Hypertension   . Diabetes mellitus without complication    Past Surgical History: No past surgical history on file.  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 right-handed male with history of hypertension, diabetes mellitus with peripheral neuropathy and multiple CVAs with residual left-sided weakness. Patient independent prior to admission living with his wife. By report patient has had full workup in the past for noted CVAs at outside hospitals including [facility], [facility], [facility] and [facility]. Patient on Aggrenox therapy prior to admission for CVA prophylaxis. Admitted 06/01/2013 with left facial droop which developed while patient was on a honeymoon cruise in [place]. An MRI was completed at outside Hospital results not made available patient requested to come back to [facility]. MRI of the brain showed extensive chronic ischemic changes as well as acute/subacute infarct in the posterior limb internal capsule and deep white matter in the right frontal parietal lobe. MRA of the head negative for aneurysm no significant intracranial stenosis. Echocardiogram and  carotid studies pending. Patient did not receive TPA. Date of stroke was 05/27/2013.  Patient transferred to CIR on 06/06/2013 .    Patient currently requires min-mod with basic self-care skills and IADL secondary to muscle weakness, abnormal tone, unbalanced muscle activation, ataxia and decreased coordination, and decreased standing balance, decreased postural control, hemiplegia and decreased balance strategies.  Prior to hospitalization, patient on disability due to 4 previous CVAs, was Modified Independent with all BADL and IADL, drove and volunteered in the community  Patient will benefit from skilled intervention to increase independence with basic self-care skills and increase level of independence with iADL prior to discharge at an overall modified independent level.  Anticipate patient will require intermittent supervision and follow up home health vs follow up outpatient.  OT - End of Session Activity Tolerance: Decreased this session Endurance Deficit: Yes OT Assessment Rehab Potential: Good OT Plan OT Intensity: Minimum of 1-2 x/day, 45 to 90 minutes OT Frequency: 5 out of 7 days OT Duration/Estimated Length of Stay: 7-10 days OT Treatment/Interventions: Balance/vestibular training;Community reintegration;DME/adaptive equipment instruction;Discharge planning;Functional mobility training;Neuromuscular re-education;Patient/family education;Self Care/advanced ADL retraining;Therapeutic Activities;UE/LE Strength taining/ROM;UE/LE Coordination activities;Therapeutic Exercise OT Recommendation Patient destination: Home Follow Up Recommendations: Home health OT;Outpatient OT Equipment Recommended: Tub/shower bench  Skilled Therapeutic Intervention OT evaluation and self care retraining to include shower, dress and groom tasks.  Focused session on dynamic balance with standing and ambulating to and from bathroom. Forced use of LUE & LLE for strengthening, motor relearning and coordination.   Patient passed off to SLP.  OT Evaluation Precautions/Restrictions  Precautions Precautions: Fall Restrictions Weight Bearing Restrictions: No Pain Denies pain Home Living/Prior Functioning Home Living Available Help at Discharge: Family;Available 24 hours/day (wife works, pt's mother avail 24/7/PRN) Type  of Home: Apartment [facility]) Home Access: Stairs to enter [employer] of Steps: 5 Entrance Stairs-Rails: Left Home Layout: Two level;Able to live on main level with bedroom/bathroom;1/2 bath on main level (could sleep on couch if needed) Alternate Level Stairs-Number of Steps: standard flight Alternate Level Stairs-Rails: Right Additional Comments: newlyweds-married 05/19/13  Lives With: Spouse Prior Function Level of Independence: Independent with basic ADLs;Independent with homemaking with ambulation;Independent with gait;Independent with transfers  Able to Take Stairs?: Reciprically Driving: Yes Vocation: On disability Comments: does a LOT of walking and likes to play with his dog.  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 BADL Overall min assist with self care Vision/Perception  Vision - History Baseline Vision: Wears glasses only for reading Patient Visual Report: No change from baseline Vision - Assessment Eye Alignment: Within Functional Limits Vision Assessment: Vision tested Tracking/Visual Pursuits: Able to track stimulus in all quads without difficulty Saccades: Within functional limits Convergence: Within functional limits Visual Fields: No apparent deficits  Cognition Overall Cognitive Status: History of cognitive impairments - at baseline Arousal/Alertness: Awake/alert Orientation Level: Oriented X4 Awareness: Appears intact Safety/Judgment: Appears intact Sensation Sensation Light Touch: Appears Intact (UEs) Stereognosis: Not tested Hot/Cold: Appears Intact (UEs) Proprioception: Not  tested Coordination Gross Motor Movements are Fluid and Coordinated: No (LUE) Fine Motor Movements are Fluid and Coordinated: No (LUE) Coordination and Movement Description: LUE with decreased speed, accuracy and rhythm with FTN, rapid alternating movements and isolated finger movements. 9-Hole Peg Test: right 54 sec, left 36 sec. Motor  Motor Motor: Within Functional Limits Motor - Skilled Clinical Observations: mild hemiparesis L side (residual from past CVAs) Mobility  Bed Mobility Bed Mobility: Supine to Sit;Sit to Supine;Sitting - Scoot to Edge of Bed Supine to Sit: HOB flat;6: Modified independent (Device/Increase time) Sitting - Scoot to Edge of Bed: 6: Modified independent (Device/Increase time) Sit to Supine: 6: Modified independent (Device/Increase time);HOB flat Transfers Sit to Stand: 4: Min assist;4: Min guard;From bed;From chair/3-in-1;With upper extremity assist;Without upper extremity assist Sit to Stand Details: Verbal cues for precautions/safety Stand to Sit: 4: Min guard;To bed;To chair/3-in-1;Without upper extremity assist;With upper extremity assist  Trunk/Postural Assessment  Cervical Assessment Cervical Assessment: Within Functional Limits Thoracic Assessment Thoracic Assessment: Within Functional Limits Lumbar Assessment Lumbar Assessment: Within Functional Limits Postural Control Postural Control: Deficits on evaluation Postural Limitations: L lateral shift in standing  Balance Static Standing Balance Static Standing - Balance Support: No upper extremity supported Static Standing - Level of Assistance: 4: Min assist Extremity/Trunk Assessment RUE Assessment RUE Assessment: Within Functional Limits    FIM:  FIM - Eating Eating Activity: 6: More than reasonable amount of time FIM - Grooming Grooming Steps: Wash, rinse, dry face;Wash, rinse, dry hands;Oral care, brush teeth, clean dentures;Brush, comb hair Grooming: 4: Patient completes 3 of 4 or 4 of  5 steps FIM - Bathing Bathing Steps Patient Completed: Chest;Right Arm;Abdomen;Left Arm;Front perineal area;Buttocks;Right upper leg;Left upper leg;Right lower leg (including foot);Left lower leg (including foot) Bathing: 4: Steadying assist FIM - Upper Body Dressing/Undressing Upper body dressing/undressing steps patient completed: Thread/unthread right sleeve of pullover shirt/dresss;Thread/unthread left sleeve of pullover shirt/dress;Put head through opening of pull over shirt/dress;Pull shirt over trunk Upper body dressing/undressing: 5: Supervision: Safety issues/verbal cues FIM - Lower Body Dressing/Undressing Lower body dressing/undressing steps patient completed: Thread/unthread right underwear leg;Thread/unthread left underwear leg;Pull underwear up/down;Thread/unthread right pants leg;Thread/unthread left pants leg;Pull pants up/down;Fasten/unfasten pants;Don/Doff right sock;Don/Doff left sock;Don/Doff left shoe;Don/Doff right shoe;Fasten/unfasten right  shoe;Fasten/unfasten left shoe Lower body dressing/undressing: 4: Steadying Assist FIM - Diplomatic Services operational officer Devices: Elevated toilet seat;Grab bars Toilet Transfers: 4-To toilet/BSC: Min A (steadying Pt. > 75%);4-From toilet/BSC: Min A (steadying Pt. > 75%) FIM - Tub/Shower Transfers Tub/Shower Assistive Devices: Tub transfer bench;Grab bars;Walk in shower Tub/shower Transfers: 4-Into Tub/Shower: Min A (steadying Pt. > 75%/lift 1 leg);4-Out of Tub/Shower: Min A (steadying Pt. > 75%/lift 1 leg)   Refer to Care Plan for Long Term Goals  Recommendations for other services: None  Discharge Criteria: Patient will be discharged from OT if patient refuses treatment 3 consecutive times without medical reason, if treatment goals not met, if there is a change in medical status, if patient makes no progress towards goals or if patient is discharged from hospital.  The above assessment, treatment plan, treatment  alternatives and goals were discussed and mutually agreed upon: by patient  Shiya Fogelman 06/07/2013, 3:56 PM

## 2013-06-08 ENCOUNTER — Inpatient Hospital Stay (HOSPITAL_COMMUNITY): Payer: Medicare PPO | Admitting: Occupational Therapy

## 2013-06-08 ENCOUNTER — Inpatient Hospital Stay (HOSPITAL_COMMUNITY): Payer: Medicare PPO | Admitting: Speech Pathology

## 2013-06-08 ENCOUNTER — Inpatient Hospital Stay (HOSPITAL_COMMUNITY): Payer: Medicare PPO

## 2013-06-08 ENCOUNTER — Inpatient Hospital Stay (HOSPITAL_COMMUNITY): Payer: Medicare PPO | Admitting: *Deleted

## 2013-06-08 LAB — GLUCOSE, CAPILLARY
Glucose-Capillary: 99 mg/dL (ref 70–99)
Glucose-Capillary: 90 mg/dL (ref 70–99)
Glucose-Capillary: 107 mg/dL — ABNORMAL HIGH (ref 70–99)
Glucose-Capillary: 99 mg/dL (ref 70–99)

## 2013-06-08 MED ORDER — TRAZODONE HCL 50 MG PO TABS
50.0000 mg | ORAL_TABLET | Freq: Every evening | ORAL | Status: DC | PRN
  Administered 2013-06-08 – 2013-06-11 (×2): 50 mg via ORAL
  Filled 2013-06-08 (×2): qty 1

## 2013-06-08 NOTE — Progress Notes (Signed)
Subjective/Complaints: Had some difficulties sleeping last night. Otherwise feeling well..  A 12 point review of systems has been performed and if not noted above is otherwise negative.   Objective: Vital Signs: Blood pressure 102/68, pulse 58, temperature 98.2 F (36.8 C), temperature source Oral, resp. rate 18, height 5\' 10"  (1.778 m), weight 105.2 kg (231 lb 14.8 oz), SpO2 97.00%. No results found.  Recent Labs  06/06/13 1954 06/07/13 0615  WBC 8.8 8.2  HGB 14.5 13.1  HCT 41.8 38.8*  PLT 277 260    Recent Labs  06/06/13 1954 06/07/13 0615  NA  --  140  K  --  3.9  CL  --  105  GLUCOSE  --  99  BUN  --  27*  CREATININE 1.10 1.06  CALCIUM  --  8.8   CBG (last 3)   Recent Labs  06/07/13 1623 06/07/13 2104 06/08/13 0732  GLUCAP 128* 118* 99    Wt Readings from Last 3 Encounters:  06/06/13 105.2 kg (231 lb 14.8 oz)  06/01/13 104.9 kg (231 lb 4.2 oz)    Physical Exam:  Constitutional: He is oriented to person, place, and time.  HENT: dentition good. Oral mucosa pink and moist  Head: Normocephalic.  Eyes: EOM are normal.  Neck: Normal range of motion. Neck supple. No thyromegaly present.  Cardiovascular: Normal rate and regular rhythm. No murmurs  Pulmonary/Chest: Effort normal and breath sounds normal. No respiratory distress. No rales, wheezes  Abdominal: Soft. Bowel sounds are normal. He exhibits distension.  Neurological: He is alert and oriented to person, place, and time.  Patient follows three-step command  Skin: Skin is warm and dry.  motor strength 4 /5 in the left deltoid, bicep, tricep, grip. 4/5 left hip flexor, knee extensors, ankle dorsi flexion plantar flexor 5/5 in the right side. Left pronator drift  Sensory exam is normal to light touch and pinprick in all 4 limbs.  Speech is dysarthric but intellgibile. Left central 7 and tongue deviation.  Visual fields are intact confrontation testing  Cerebellar testing shows ataxia finger nose to  finger in the left upper    Assessment/Plan: 1. Functional deficits secondary to thrombotic right PLIC infarct, hx of prior CVA's which require 3+ hours per day of interdisciplinary therapy in a comprehensive inpatient rehab setting. Physiatrist is providing close team supervision and 24 hour management of active medical problems listed below. Physiatrist and rehab team continue to assess barriers to discharge/monitor patient progress toward functional and medical goals. FIM: FIM - Bathing Bathing Steps Patient Completed: Chest;Right Arm;Abdomen;Left Arm;Front perineal area;Buttocks;Right upper leg;Left upper leg;Right lower leg (including foot);Left lower leg (including foot) Bathing: 4: Steadying assist  FIM - Upper Body Dressing/Undressing Upper body dressing/undressing steps patient completed: Thread/unthread right sleeve of pullover shirt/dresss;Thread/unthread left sleeve of pullover shirt/dress;Put head through opening of pull over shirt/dress;Pull shirt over trunk Upper body dressing/undressing: 5: Supervision: Safety issues/verbal cues FIM - Lower Body Dressing/Undressing Lower body dressing/undressing steps patient completed: Thread/unthread right underwear leg;Thread/unthread left underwear leg;Pull underwear up/down;Thread/unthread right pants leg;Thread/unthread left pants leg;Pull pants up/down;Fasten/unfasten pants;Don/Doff right sock;Don/Doff left sock;Don/Doff left shoe;Don/Doff right shoe;Fasten/unfasten right shoe;Fasten/unfasten left shoe Lower body dressing/undressing: 4: Steadying Assist     FIM - Diplomatic Services operational officer Devices: Elevated toilet seat;Grab bars Toilet Transfers: 4-To toilet/BSC: Min A (steadying Pt. > 75%);4-From toilet/BSC: Min A (steadying Pt. > 75%)  FIM - Bed/Chair Transfer Bed/Chair Transfer: 4: Bed > Chair or W/C: Min A (steadying Pt. > 75%);4: Chair or  W/C > Bed: Min A (steadying Pt. > 75%);6: Supine > Sit: No assist;6: Sit >  Supine: No assist  FIM - Locomotion: Wheelchair Locomotion: Wheelchair: 0: Activity did not occur FIM - Locomotion: Ambulation Ambulation/Gait Assistance: 4: Min assist Locomotion: Ambulation: 4: Travels 150 ft or more with minimal assistance (Pt.>75%)  Comprehension Comprehension Mode: Auditory Comprehension: 6-Follows complex conversation/direction: With extra time/assistive device  Expression Expression Mode: Verbal Expression: 3-Expresses basic 50 - 74% of the time/requires cueing 25 - 50% of the time. Needs to repeat parts of sentences.  Social Interaction Social Interaction: 6-Interacts appropriately with others with medication or extra time (anti-anxiety, antidepressant).  Problem Solving Problem Solving: 6-Solves complex problems: With extra time  Memory Memory: 6-Assistive device: No helper  Medical Problem List and Plan:  1. 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 DVT Prophylaxis/Anticoagulation: Subcutaneous Lovenox. Monitor platelet counts and any signs of bleeding  3. Pain Management: Tylenol as needed.  4. Neuropsych: This patient is capable of making decisions on his own behalf.  5. Diabetes mellitus with peripheral neuropathy. Hemoglobin A1c 6.2. Glucophage 1000 mg twice a day. Check blood sugars a.c. and at bedtime.  6. Hyperlipidemia. Lipitor  7. Hypertension. No present antihypertensive medications. Patient on Aldactone 50 mg daily and Norvasc 5 mg daily prior to admission. Normotensive at present 8. FEN: push po fluids, hold aldactone if needed. Will recheck labs tomorrow  LOS (Days) 2 A FACE TO FACE EVALUATION WAS PERFORMED  Nanette Wirsing T 06/08/2013 9:44 AM

## 2013-06-08 NOTE — Progress Notes (Signed)
Speech Language Pathology Daily Session Note  Patient Details  Name: Yasir Kitner MRN: [id number] Date of Birth: February 28, 1971  Today's Date: 06/08/2013 Time: 1330-1415 Time Calculation (min): 45 min  Short Term Goals: Week 1: SLP Short Term Goal 1 (Week 1): Patient will identify effective speech intelligibility strategies with Mod I  SLP Short Term Goal 2 (Week 1): Patient will utilize speech intelligibility strategies during structured tasks with Mod I  SLP Short Term Goal 3 (Week 1): Patient will utilize speech intelligibility strategies in unstructured tasks with Supervison level verbal cues.  Skilled Therapeutic Interventions: Skilled treatment session focused on addressing speech intelligibility goals.  SLP facilitated session with structured description task to address articulation.  Patient required Supervision level verbal cues utilize word finding strategies and Mod faded to Min assist verbal cues to utilize speech intelligibility compensatory strategies: slow, loud and over articulation.       FIM:  Comprehension Comprehension: 6-Follows complex conversation/direction: With extra time/assistive device Expression Expression Mode: Verbal Expression: 3-Expresses basic 50 - 74% of the time/requires cueing 25 - 50% of the time. Needs to repeat parts of sentences. Social Interaction Social Interaction: 6-Interacts appropriately with others with medication or extra time (anti-anxiety, antidepressant). Problem Solving Problem Solving: 5-Solves complex 90% of the time/cues < 10% of the time Memory Memory: 6-Assistive device: No helper  Pain Pain Assessment Pain Assessment: No/denies pain  Therapy/Group: Individual Therapy  [name]., CCC-SLP [phone]  Chosen Garron 06/08/2013, 4:02 PM

## 2013-06-08 NOTE — Progress Notes (Signed)
Occupational Therapy Session Note  Patient Details  Name: Thomas Nixon MRN: [id number] Date of Birth: 17-Mar-1971  Today's Date: 06/08/2013 Time: 0915-1000 Time Calculation (min): 45 min  Short Term Goals: Week 1:  OT Short Term Goal 1 (Week 1): STG=LTG secondary to ESLOS  Skilled Therapeutic Interventions/Progress Updates:    Pt seen for ADL retraining with focus on dynamic balance with standing and ambulating to and from bathroom.  Engaged in bathing at sit to stand level in room shower, with distant supervision and pt informing this clinician when standing for pericare.  Forced use of LUE & LLE for strengthening, motor relearning and coordination with bathing and dressing.  Pt able to self-correct when donned underwear Rt leg first, reporting he needs to don weaker leg first and started over.  Pt able to don TEDs this session with assist to adjust fit at ball of foot.  Pt with improved coordination with ability to tie shoes.  Min assist for balance with ambulation at beginning of session progressing to close supervision-contact guard.   Therapy Documentation Precautions:  Precautions Precautions: Fall Restrictions Weight Bearing Restrictions: No Pain:  Pt with no c/o pain this session.  See FIM for current functional status  Therapy/Group: Individual Therapy  Jadine Brumley 06/08/2013, 10:00 AM

## 2013-06-08 NOTE — Progress Notes (Addendum)
Physical Therapy Session Note  Patient Details  Name: Thomas Nixon MRN: [id number] Date of Birth: [birth year]  Today's Date: 06/08/2013 Time: 1515-1600 Time Calculation (min): 45 min  Short Term Goals: Week 1:  PT Short Term Goal 1 (Week 1): STGs=LTGs secondary to ELOS  Skilled Therapeutic Interventions/Progress Updates:    Patient received sitting in recliner. Session focused on gait training, high level balance, and core strengthening and NMR. See details below.  Discussion with patient about falls risk at home and indications vs. Contraindications for attempting floor transfer vs. Calling EMS. Patient able to recall instance in when calling EMS would be necessary. Patient performed floor transfer with min assist.  Patient performed various core strengthening exercises and weight bearing activities: planks on elbows 5x 10-15", side planks 2x10-15" each side, planks on hands 3 x10". Patient returned to room and left seated in recliner with all needs within reach.  Therapy Documentation Precautions:  Precautions Precautions: Fall Restrictions Weight Bearing Restrictions: No Pain: Pain Assessment Pain Assessment: No/denies pain Pain Score: 0-No pain Locomotion : Ambulation Ambulation: Yes Ambulation/Gait Assistance: 4: Min guard Ambulation Distance (Feet): 172 Feet Assistive device: None Ambulation/Gait Assistance Details: Verbal cues for precautions/safety;Verbal cues for gait pattern;Manual facilitation for weight shifting;Tactile cues for posture Ambulation/Gait Assistance Details: Patient instructed in gait training 172'x2 without AD in controlled environment. Patient requires manual facilitation for lateral weight shifts and verbal cues to increase L step length, increase L foot clearance, and gait speed.  Gait Gait: Yes Gait Pattern: Impaired Gait Pattern: Decreased step length - left;Decreased weight shift to right;Decreased stride length;Decreased dorsiflexion - left;Wide  base of support;Decreased stance time - right High Level Ambulation High Level Ambulation: Backwards walking;Other high level ambulation Backwards Walking: x60' with ball toss; requires min assist, no overt LOB High Level Ambulation - Other Comments: forward walking with ball toss x90'; requires min assist, no overt LOB Patient performed ambulation while stepping over 3 bolsters (x3 rounds) with min assist, no overt LOB  Balance: Dynamic Standing Balance Dynamic Standing - Balance Support: No upper extremity supported Dynamic Standing - Level of Assistance: 4: Min assist Dynamic Standing - Balance Activities: Kicking ball;Lateral lean/weight shifting Dynamic Standing - Comments: Emphasis on lateral weight shifts when kicking ball. Other Treatments: Weight Bearing Technique Weight Bearing Technique: Yes RUE Weight Bearing Technique: Quadruped;Other (comment) LUE Weight Bearing Technique: Quadruped;Other (comment) Response to Weight Bearing Technique: Half kneeling with each LE leading, patient progresses to ball toss in this position. Patient demonstrates increased difficulty with L LE back.  See FIM for current functional status  Therapy/Group: Individual Therapy  Chipper Herb. Caitlyne Ingham, PT, DPT  06/08/2013, 4:37 PM

## 2013-06-08 NOTE — Progress Notes (Signed)
Physical Therapy Session Note  Patient Details  Name: Brylon Brenning MRN: [id number] Date of Birth: 05-01-1971  Today's Date: 06/08/2013 Time: 1100-1155 Time Calculation (min): 55 min  Short Term Goals: Week 1:  PT Short Term Goal 1 (Week 1): STGs=LTGs secondary to ELOS  Skilled Therapeutic Interventions/Progress Updates:  Patient in bathroom upon entering room. Patient performed toileting tasks with min steady assist and grab bar. Patient ambulated 150 feet without AD with close supervision. Patient requires occasional cues to increase stride on left. Patient challenged with changing speeds of ambulation, head turns, turns and stops, sideways and backwards walking. All with close supervision to occasional steady assist. Patient has most difficulty with turning quickly. Patient exercised on kinetron in standing 2 x 2 minutes at 15 cm/sec. Patient used biodex balance system for weight shift, limits of stability and maze on skill level 1 and 2 to work on weight shifting and balance reactions. Patient tossed and bounced ball to work on dynamic standing balance. Patient also stood on foam pad to increase difficulty. Patient tossed ball while ambulating and side stepping with close supervision to occasional steady assist. Patient ambulated 200 feet to room and was left in recliner with all items in reach.    Therapy Documentation Precautions:  Precautions Precautions: Fall Restrictions Weight Bearing Restrictions: No Pain: Pain Assessment Pain Assessment: No/denies pain Locomotion : Ambulation Ambulation/Gait Assistance: 4: Min guard   See FIM for current functional status  Therapy/Group: Individual Therapy  Arelia Longest M 06/08/2013, 12:01 PM

## 2013-06-09 ENCOUNTER — Inpatient Hospital Stay (HOSPITAL_COMMUNITY): Payer: Medicare PPO | Admitting: Occupational Therapy

## 2013-06-09 ENCOUNTER — Inpatient Hospital Stay (HOSPITAL_COMMUNITY): Payer: Medicare PPO | Admitting: Speech Pathology

## 2013-06-09 ENCOUNTER — Inpatient Hospital Stay (HOSPITAL_COMMUNITY): Payer: Medicare PPO | Admitting: *Deleted

## 2013-06-09 DIAGNOSIS — I10 Essential (primary) hypertension: Secondary | ICD-10-CM

## 2013-06-09 DIAGNOSIS — I6330 Cerebral infarction due to thrombosis of unspecified cerebral artery: Secondary | ICD-10-CM

## 2013-06-09 LAB — GLUCOSE, CAPILLARY
Glucose-Capillary: 117 mg/dL — ABNORMAL HIGH (ref 70–99)
Glucose-Capillary: 146 mg/dL — ABNORMAL HIGH (ref 70–99)
Glucose-Capillary: 122 mg/dL — ABNORMAL HIGH (ref 70–99)
Glucose-Capillary: 86 mg/dL (ref 70–99)

## 2013-06-09 NOTE — Progress Notes (Signed)
Physical Therapy Session Note  Patient Details  Name: Fabion Gatson MRN: [id number] Date of Birth: Oct 10, 1971  Today's Date: 06/09/2013 Time: 7829-5621 Time Calculation (min): 73 min  Short Term Goals: Week 1:  PT Short Term Goal 1 (Week 1): STGs=LTGs secondary to ELOS  Skilled Therapeutic Interventions/Progress Updates:    Patient received sitting edge of bed, wife [name]) present. Patient and Cynithia expressing desire to have her be able to assist patient to/from bathroom. Education/discussion about patient's balance deficits and need for close supervision for ambulation to/from bathroom. PT demonstrated technique and then [name] return demonstrated. Checked off to assist patient to/from bathroom in room.  Today's session focused on high level balance, gait training, and patient's volunteer-related tasks. 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 Patient requires supervision for task.  Patient performed ambulation while stopping to pick up 15 items of various shapes and weights off floor with supervision. Patient performed high level gait/coordination activities with agility ladder: Patient completed ladder x2 ambulating with alternating feet in each square and foot in/in, out/out  of each squareto facilitate increased lateral weight shifts and side stepping. Patient performed 88' x2 toe walking with min assist. Patient performed 25' of side stepping with hip abduction to push BOSU ball along floor to facilitate increased lateral weight shifts, side stepping, and single leg stance; Requires min assist. Patient performed side stepping "monster walks" with green theraband around ankles. Patient instructed to maintain B LEs in squat position while side stepping  and maintaining tension on theraband to facilitate increased quad and hip abductor strength.  Patient returned to room and left seated in recliner with all needs within reach.  Therapy Documentation Precautions:  Precautions Precautions: Fall Restrictions Weight Bearing Restrictions: No Pain: Pain Assessment Pain Assessment: No/denies pain Pain Score: 0-No pain Locomotion : Ambulation Ambulation: Yes Ambulation/Gait Assistance: 5: Supervision Ambulation Distance (Feet): 172 Feetx2 (60' on carpet) Assistive device: None Ambulation/Gait Assistance Details: Verbal cues for precautions/safety;Verbal cues for gait pattern;Tactile cues for posture Ambulation/Gait Assistance Details: Patient instructed in gait training 172' x2 without AD in controlled environment. Patient requires verbal cues to increase L step length and for intermittent L foot clearance, and increased gait speed. Gait Gait: Yes Gait Pattern: Impaired Gait Pattern: Decreased step length - left;Decreased stride length;Decreased dorsiflexion - left Stairs / Additional Locomotion Stairs: Yes Stairs Assistance: 4: Min guard Stairs Assistance Details: Verbal cues for precautions/safety Stair Management Technique: One rail Right;Alternating pattern;Forwards Number of Stairs: 12 Height of Stairs: 6  Balance: Dynamic Standing Balance Dynamic Standing - Balance Support: No upper extremity supported Dynamic Standing - Level of Assistance: 4: Min assist;5: Stand by assistance Dynamic Standing - Balance Activities: [employer];Compliant surfaces Dynamic Standing - Comments: x20 ball toss with 6# ball on rebounder while standing on foam. Requires min guard progressing to close supervision.  See FIM for current functional status  Therapy/Group: Individual Therapy  Chipper Herb. Drianna Chandran, PT, DPT 06/09/2013, 12:31 PM

## 2013-06-09 NOTE — Progress Notes (Signed)
Speech Language Pathology Daily Session Note  Patient Details  Name: Thomas Nixon MRN: [id number] Date of Birth: 16-Jul-1971  Today's Date: 06/09/2013 Time: 4098-1191 Time Calculation (min): 40 min  Short Term Goals: Week 1: SLP Short Term Goal 1 (Week 1): Patient will identify effective speech intelligibility strategies with Mod I  SLP Short Term Goal 2 (Week 1): Patient will utilize speech intelligibility strategies during structured tasks with Mod I  SLP Short Term Goal 3 (Week 1): Patient will utilize speech intelligibility strategies in unstructured tasks with Supervison level verbal cues.  Skilled Therapeutic Interventions: Skilled treatment session focused on addressing speech intelligibility goals.  Wife present for session today to observe and learn strategies to assist.  SLP facilitated session with structured description task to address articulation.  Patient required increased wait time to utilize word finding strategies and Min assist verbal cues to utilize speech intelligibility compensatory strategies: slow, loud and over articulation in a quiet controlled environment.  Wife verbalized understanding of information and return demonstrated cuing patient while he performed diaphragmatic breathing.  Continue with current plan of care.   FIM:  Comprehension Comprehension Mode: Auditory Comprehension: 6-Follows complex conversation/direction: With extra time/assistive device Expression Expression Mode: Verbal Expression: 4-Expresses basic 75 - 89% of the time/requires cueing 10 - 24% of the time. Needs helper to occlude trach/needs to repeat words. Social Interaction Social Interaction: 6-Interacts appropriately with others with medication or extra time (anti-anxiety, antidepressant). Problem Solving Problem Solving: 5-Solves complex 90% of the time/cues < 10% of the time Memory Memory: 6-Assistive device: No helper  Pain Pain Assessment Pain Assessment: No/denies pain Pain  Score: 0-No pain  Therapy/Group: Individual Therapy  [name]., CCC-SLP [phone]  Thomas Nixon 06/09/2013, 1:09 PM

## 2013-06-09 NOTE — Progress Notes (Signed)
Occupational Therapy Session Note  Patient Details  Name: Thomas Nixon MRN: [id number] Date of Birth: September 24, 1971  Today's Date: 06/09/2013 Time: 6295-2841 Time Calculation (min): 30 min  Short Term Goals: Week 1:  OT Short Term Goal 1 (Week 1): STG=LTG secondary to ESLOS  Skilled Therapeutic Interventions/Progress Updates:  Patient ambulated to therapy apartment with HHA for ~half the distance then min steady assist at hips in busy hallway the second half of the distance.  Patient practiced different methods of climbing into and out of the tub/shower combo with min-supervision after several attempts. Anticipate that patient will not need DME by the time he discharges next week.  Encouraged patient to practice again before discharge to include full shower in the tub shower combo to meet Mod I goal before discharge.  Patient the engaged in simple kitchen cooking task of grilled cheese sandwich with supervision.  Patient then ambulated back to his room while carrying the sandwich in right hand and cup of water in left.  Patient required max cues to watch his left hand and when he spilled some water on the floor he was able to clean it up with min assist for balance.  Therapy Documentation Precautions:  Precautions Precautions: Fall Restrictions Weight Bearing Restrictions: No Pain: Pain Assessment Pain Assessment: No/denies pain  Therapy/Group: Individual Therapy  Zonnique Norkus 06/09/2013, 4:38 PM

## 2013-06-09 NOTE — Progress Notes (Signed)
Subjective/Complaints: Slept well last night. No pain. Pleased with progress. Wife in the room A 12 point review of systems has been performed and if not noted above is otherwise negative.   Objective: Vital Signs: Blood pressure 107/70, pulse 52, temperature 98.7 F (37.1 C), temperature source Oral, resp. rate 20, height 5\' 10"  (1.778 m), weight 105.2 kg (231 lb 14.8 oz), SpO2 98.00%. No results found.  Recent Labs  06/06/13 1954 06/07/13 0615  WBC 8.8 8.2  HGB 14.5 13.1  HCT 41.8 38.8*  PLT 277 260    Recent Labs  06/06/13 1954 06/07/13 0615  NA  --  140  K  --  3.9  CL  --  105  GLUCOSE  --  99  BUN  --  27*  CREATININE 1.10 1.06  CALCIUM  --  8.8   CBG (last 3)   Recent Labs  06/08/13 1613 06/08/13 2051 06/09/13 0738  GLUCAP 107* 99 117*    Wt Readings from Last 3 Encounters:  06/06/13 105.2 kg (231 lb 14.8 oz)  06/01/13 104.9 kg (231 lb 4.2 oz)    Physical Exam:  Constitutional: He is oriented to person, place, and time.  HENT: dentition good. Oral mucosa pink and moist  Head: Normocephalic.  Eyes: EOM are normal.  Neck: Normal range of motion. Neck supple. No thyromegaly present.  Cardiovascular: Normal rate and regular rhythm. No murmurs  Pulmonary/Chest: Effort normal and breath sounds normal. No respiratory distress. No rales, wheezes  Abdominal: Soft. Bowel sounds are normal. He exhibits distension.  Neurological: He is alert and oriented to person, place, and time.  Patient follows three-step command  Skin: Skin is warm and dry.  motor strength 4 /5 in the left deltoid, bicep, tricep, grip. 4/5 left hip flexor, knee extensors, ankle dorsi flexion plantar flexor 5/5 in the right side. Left pronator drift  Sensory exam is normal to light touch and pinprick in all 4 limbs.  Speech is dysarthric but intellgibile. Left central 7 and tongue deviation.  Visual fields are intact confrontation testing  Cerebellar testing shows ataxia finger nose to  finger in the left upper    Assessment/Plan: 1. Functional deficits secondary to thrombotic right PLIC infarct, hx of prior CVA's which require 3+ hours per day of interdisciplinary therapy in a comprehensive inpatient rehab setting. Physiatrist is providing close team supervision and 24 hour management of active medical problems listed below. Physiatrist and rehab team continue to assess barriers to discharge/monitor patient progress toward functional and medical goals. FIM: FIM - Bathing Bathing Steps Patient Completed: Chest;Right Arm;Abdomen;Left Arm;Front perineal area;Buttocks;Right upper leg;Left upper leg;Right lower leg (including foot);Left lower leg (including foot) Bathing: 5: Supervision: Safety issues/verbal cues  FIM - Upper Body Dressing/Undressing Upper body dressing/undressing steps patient completed: Thread/unthread right sleeve of pullover shirt/dresss;Thread/unthread left sleeve of pullover shirt/dress;Put head through opening of pull over shirt/dress;Pull shirt over trunk Upper body dressing/undressing: 5: Supervision: Safety issues/verbal cues FIM - Lower Body Dressing/Undressing Lower body dressing/undressing steps patient completed: Thread/unthread right underwear leg;Thread/unthread left underwear leg;Pull underwear up/down;Thread/unthread right pants leg;Thread/unthread left pants leg;Pull pants up/down;Don/Doff right sock;Don/Doff left sock;Don/Doff left shoe;Don/Doff right shoe;Fasten/unfasten right shoe;Fasten/unfasten left shoe Lower body dressing/undressing: 5: Supervision: Safety issues/verbal cues  FIM - Toileting Toileting steps completed by patient: Adjust clothing prior to toileting;Performs perineal hygiene;Adjust clothing after toileting Toileting Assistive Devices: Grab bar or rail for support Toileting: 4: Steadying assist  FIM - Diplomatic Services operational officer Devices: Elevated toilet seat;Grab bars Toilet Transfers: 4-From  toilet/BSC:  Min A (steadying Pt. > 75%);4-To toilet/BSC: Min A (steadying Pt. > 75%)  FIM - Bed/Chair Transfer Bed/Chair Transfer: 4: Bed > Chair or W/C: Min A (steadying Pt. > 75%);4: Chair or W/C > Bed: Min A (steadying Pt. > 75%)  FIM - Locomotion: Wheelchair Locomotion: Wheelchair: 0: Activity did not occur FIM - Locomotion: Ambulation Ambulation/Gait Assistance: 4: Min guard Locomotion: Ambulation: 4: Travels 150 ft or more with minimal assistance (Pt.>75%)  Comprehension Comprehension Mode: Auditory Comprehension: 6-Follows complex conversation/direction: With extra time/assistive device  Expression Expression Mode: Verbal Expression: 3-Expresses basic 50 - 74% of the time/requires cueing 25 - 50% of the time. Needs to repeat parts of sentences.  Social Interaction Social Interaction: 6-Interacts appropriately with others with medication or extra time (anti-anxiety, antidepressant).  Problem Solving Problem Solving: 5-Solves complex 90% of the time/cues < 10% of the time  Memory Memory: 6-Assistive device: No helper  Medical Problem List and Plan:  1. 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 DVT Prophylaxis/Anticoagulation: Subcutaneous Lovenox. Monitor platelet counts and any signs of bleeding  3. Pain Management: Tylenol as needed.  4. Neuropsych: This patient is capable of making decisions on his own behalf.  5. Diabetes mellitus with peripheral neuropathy. Hemoglobin A1c 6.2. Glucophage 1000 mg twice a day. Checking blood sugars a.c. and at bedtime. Cm diet 6. Hyperlipidemia. Lipitor  7. Hypertension. No present antihypertensive medications. Patient on Aldactone 50 mg daily and Norvasc 5 mg daily prior to admission. Normotensive at present 8. FEN: push po fluids, hold aldactone if needed. Will recheck labs monday  LOS (Days) 3 A FACE TO FACE EVALUATION WAS  PERFORMED  SWARTZ,ZACHARY T 06/09/2013 9:20 AM

## 2013-06-09 NOTE — Progress Notes (Signed)
Occupational Therapy Session Note  Patient Details  Name: Kiyoshi Schaab MRN: [id number] Date of Birth: [birth year]  Today's Date: 06/09/2013 Time: 0918-1000 Time Calculation (min): 42 min  Short Term Goals: Week 1:  OT Short Term Goal 1 (Week 1): STG=LTG secondary to ESLOS  Skilled Therapeutic Interventions/Progress Updates:    Pt seen for ADL retraining with focus on dynamic balance with standing and ambulating to and from bathroom. Engaged in bathing at sit to stand level in room shower, with distant supervision and pt informing this clinician when standing for pericare. Forced use of LUE & LLE for strengthening, motor relearning and coordination with bathing and dressing. Pt donned TEDs this session with min cues for appropriate fit. Pt with improved coordination with ability to tie shoes. Supervision with all mobility this session with increased balance reactions.  Pt's wife present and reports they have tub/shower combination and had questions regarding DME.  Plan to assess need for shower chair v. tub bench upon d/c.  Therapy Documentation Precautions:  Precautions Precautions: Fall Restrictions Weight Bearing Restrictions: No Pain: Pain Assessment Pain Assessment: No/denies pain  See FIM for current functional status  Therapy/Group: Individual Therapy  Rosalio Loud 06/09/2013, 10:23 AM

## 2013-06-10 ENCOUNTER — Inpatient Hospital Stay (HOSPITAL_COMMUNITY): Payer: Medicare PPO | Admitting: Speech Pathology

## 2013-06-10 ENCOUNTER — Inpatient Hospital Stay (HOSPITAL_COMMUNITY): Payer: Medicare PPO | Admitting: Occupational Therapy

## 2013-06-10 ENCOUNTER — Inpatient Hospital Stay (HOSPITAL_COMMUNITY): Payer: Medicare PPO | Admitting: Physical Therapy

## 2013-06-10 LAB — GLUCOSE, CAPILLARY
Glucose-Capillary: 136 mg/dL — ABNORMAL HIGH (ref 70–99)
Glucose-Capillary: 92 mg/dL (ref 70–99)
Glucose-Capillary: 96 mg/dL (ref 70–99)
Glucose-Capillary: 97 mg/dL (ref 70–99)

## 2013-06-10 NOTE — Progress Notes (Signed)
Physical Therapy Session Note  Patient Details  Name: Thomas Nixon MRN: [id number] Date of Birth: 06/29/1971  Today's Date: 06/10/2013 Time: 1500-1530 Time Calculation (min): 30 min  Short Term Goals: Week 1:  PT Short Term Goal 1 (Week 1): STGs=LTGs secondary to ELOS   Therapy Documentation Precautions:  Precautions Precautions: Fall Restrictions Weight Bearing Restrictions: No Pain: denies pain  Gait Training:(15') no assistive device, S/Independent with verbal cues to stride longer, 6 x 50' and 1 x 200' with marked improvement (no LOB, good foot clearance, arm swing) Therapeutic Exercise:(15') 10' on Nu-Step Level 5 with short rest break.  See FIM for current functional status  Therapy/Group: Individual Therapy  Rex Kras 06/10/2013, 5:06 PM

## 2013-06-10 NOTE — Progress Notes (Signed)
Physical Therapy Session Note  Patient Details  Name: Thomas Nixon MRN: [id number] Date of Birth: [birth year]  Today's Date: 06/10/2013 Time: 1000-1100 Time Calculation (min): 60 min  Short Term Goals: Week 1:  PT Short Term Goal 1 (Week 1): STGs=LTGs secondary to ELOS  Therapy Documentation Precautions:  Precautions Precautions: Fall Restrictions Weight Bearing Restrictions: No Pain: Pain Assessment Pain Assessment: No/denies pain Pain Score: 0-No pain  Gait Training:(15')  2 x 200' on tile floors S/Independent with verbal cues to stride longer. 1 x 150' on brick patio and sidewalk as well as up/down 8 steps using handrail on one side S/Independent. Therapeutic Exercise:(30') Nu-Step x 15' Level 5, with occasional verbal cues to re-grip hand. Mat table for bridging and then bridging with theraband-resisted hip abduction. Neuromuscular Reeducation:(15') Standing inside parallel bars for safety but no hands on grab bars, performing alternate toe-tapping on top of orange cone. Dynamic balance was good and hitting target was mostly good with occasional overshooting.    See FIM for current functional status  Therapy/Group: Individual Therapy  Frederic Tones J 06/10/2013, 10:19 AM

## 2013-06-10 NOTE — Progress Notes (Signed)
Speech Language Pathology Daily Session Note  Patient Details  Name: Thomas Nixon MRN: [id number] Date of Birth: [birth year]  Today's Date: 06/10/2013 Time: 1135-1205 Time Calculation (min): 30 min  Short Term Goals: Week 1: SLP Short Term Goal 1 (Week 1): Patient will identify effective speech intelligibility strategies with Mod I  SLP Short Term Goal 2 (Week 1): Patient will utilize speech intelligibility strategies during structured tasks with Mod I  SLP Short Term Goal 3 (Week 1): Patient will utilize speech intelligibility strategies in unstructured tasks with Supervison level verbal cues.  Skilled Therapeutic Interventions: Therapeutic intervention complete, with treatment focusing on increasing intelligible speech by implementing strategies.  During structured activities, the patient required mod I (reminded to use strategies intermittently).  He was given verbal cues x2 during unstructured tasks to slow speech rate and increase intensity.  Continue with current treatment program.   FIM:  Comprehension Comprehension: 6-Follows complex conversation/direction: With extra time/assistive device Expression Expression Mode: Verbal Expression: 5-Expresses complex 90% of the time/cues < 10% of the time Problem Solving Problem Solving: 5-Solves basic problems: With no assist Memory Memory: 6-More than reasonable amt of time  Pain Pain Assessment Pain Assessment: No/denies pain Pain Score: 0-No pain  Therapy/Group: Individual Therapy  Lenny Pastel 06/10/2013, 12:50 PM

## 2013-06-10 NOTE — Progress Notes (Signed)
Subjective/Complaints: No complaints. Up sitting EOB waiting for breakfast A 12 point review of systems has been performed and if not noted above is otherwise negative.   Objective: Vital Signs: Blood pressure 112/63, pulse 54, temperature 98 F (36.7 C), temperature source Oral, resp. rate 16, height 5\' 10"  (1.778 m), weight 105.2 kg (231 lb 14.8 oz), SpO2 95.00%. No results found. No results found for this basename: WBC, HGB, HCT, PLT,  in the last 72 hours No results found for this basename: NA, K, CL, CO, GLUCOSE, BUN, CREATININE, CALCIUM,  in the last 72 hours CBG (last 3)   Recent Labs  06/09/13 1615 06/09/13 2217 06/10/13 0757  GLUCAP 122* 86 136*    Wt Readings from Last 3 Encounters:  06/06/13 105.2 kg (231 lb 14.8 oz)  06/01/13 104.9 kg (231 lb 4.2 oz)    Physical Exam:  Constitutional: He is oriented to person, place, and time.  HENT: dentition good. Oral mucosa pink and moist  Head: Normocephalic.  Eyes: EOM are normal.  Neck: Normal range of motion. Neck supple. No thyromegaly present.  Cardiovascular: Normal rate and regular rhythm. No murmurs  Pulmonary/Chest: Effort normal and breath sounds normal. No respiratory distress. No rales, wheezes  Abdominal: Soft. Bowel sounds are normal. He exhibits distension.  Neurological: He is alert and oriented to person, place, and time.  Patient follows three-step command  Skin: Skin is warm and dry.  motor strength 4 /5 in the left deltoid, bicep, tricep, grip. 4/5 left hip flexor, knee extensors, ankle dorsi flexion plantar flexor 5/5 in the right side. Left pronator drift  Sensory exam is normal to light touch and pinprick in all 4 limbs.  Speech is dysarthric but intellgibile. Left central 7 and tongue deviation.  Visual fields are intact confrontation testing  Cerebellar testing shows ataxia finger nose to finger in the left upper    Assessment/Plan: 1. Functional deficits secondary to thrombotic right PLIC  infarct, hx of prior CVA's which require 3+ hours per day of interdisciplinary therapy in a comprehensive inpatient rehab setting. Physiatrist is providing close team supervision and 24 hour management of active medical problems listed below. Physiatrist and rehab team continue to assess barriers to discharge/monitor patient progress toward functional and medical goals. FIM: FIM - Bathing Bathing Steps Patient Completed: Chest;Right Arm;Abdomen;Left Arm;Front perineal area;Buttocks;Right upper leg;Left upper leg;Right lower leg (including foot);Left lower leg (including foot) Bathing: 6: More than reasonable amount of time  FIM - Upper Body Dressing/Undressing Upper body dressing/undressing steps patient completed: Thread/unthread right sleeve of pullover shirt/dresss;Thread/unthread left sleeve of pullover shirt/dress;Put head through opening of pull over shirt/dress;Pull shirt over trunk Upper body dressing/undressing: 5: Supervision: Safety issues/verbal cues FIM - Lower Body Dressing/Undressing Lower body dressing/undressing steps patient completed: Thread/unthread right underwear leg;Thread/unthread left underwear leg;Pull underwear up/down;Thread/unthread right pants leg;Thread/unthread left pants leg;Pull pants up/down;Don/Doff right sock;Don/Doff left sock;Don/Doff left shoe;Don/Doff right shoe;Fasten/unfasten right shoe;Fasten/unfasten left shoe Lower body dressing/undressing: 5: Supervision: Safety issues/verbal cues  FIM - Toileting Toileting steps completed by patient: Adjust clothing prior to toileting;Performs perineal hygiene;Adjust clothing after toileting Toileting Assistive Devices: Grab bar or rail for support Toileting: 4: Steadying assist  FIM - Diplomatic Services operational officer Devices: Elevated toilet seat;Grab bars Toilet Transfers: 4-From toilet/BSC: Min A (steadying Pt. > 75%);4-To toilet/BSC: Min A (steadying Pt. > 75%)  FIM - Bed/Chair Transfer Bed/Chair  Transfer Assistive Devices: Arm rests Bed/Chair Transfer: 5: Chair or W/C > Bed: Supervision (verbal cues/safety issues);5: Bed > Chair or W/C:  Supervision (verbal cues/safety issues)  FIM - Locomotion: Wheelchair Locomotion: Wheelchair: 0: Activity did not occur FIM - Locomotion: Ambulation Ambulation/Gait Assistance: 5: Supervision Locomotion: Ambulation: 5: Travels 150 ft or more with supervision/safety issues  Comprehension Comprehension Mode: Auditory Comprehension: 6-Follows complex conversation/direction: With extra time/assistive device  Expression Expression Mode: Verbal Expression: 5-Expresses complex 90% of the time/cues < 10% of the time  Social Interaction Social Interaction: 6-Interacts appropriately with others with medication or extra time (anti-anxiety, antidepressant).  Problem Solving Problem Solving: 5-Solves basic problems: With no assist  Memory Memory: 6-More than reasonable amt of time  Medical Problem List and Plan:  1. 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 DVT Prophylaxis/Anticoagulation: Subcutaneous Lovenox. Monitor platelet counts and any signs of bleeding  3. Pain Management: Tylenol as needed.  4. Neuropsych: This patient is capable of making decisions on his own behalf.  5. Diabetes mellitus with peripheral neuropathy. Hemoglobin A1c 6.2. Glucophage 1000 mg twice a day. Checking blood sugars a.c. and at bedtime. Cm diet 6. Hyperlipidemia. Lipitor  7. Hypertension. No present antihypertensive medications. Patient on Aldactone 50 mg daily and Norvasc 5 mg daily prior to admission. Normotensive at present 8. FEN: push po fluids, hold aldactone if needed. Will recheck labs monday  LOS (Days) 4 A FACE TO FACE EVALUATION WAS PERFORMED  SWARTZ,ZACHARY T 06/10/2013 8:38 AM

## 2013-06-10 NOTE — Progress Notes (Signed)
Occupational Therapy Session Note  Patient Details  Name: Thomas Nixon MRN: [id number] Date of Birth: 23-May-1971  Today's Date: 06/10/2013 Time: 4098-1191 and 1500-1530 Time Calculation (min): 42 min and 30 min  Short Term Goals: Week 1:  OT Short Term Goal 1 (Week 1): STG=LTG secondary to ESLOS  Skilled Therapeutic Interventions/Progress Updates:    Visit 1: No c/o pain.  Pt seen for ADL retraining of B/D at shower level with a focus on LUE coordination and dynamic standing balance.  Pt ambulated around room with close supervision to retrieve clothing from drawers and gather supplies for shower.  Ambulated into shower with close supervision and then bathed self independently sitting on tub bench.  He ambulated to bed to dress self independently. He was able to don his TED hose I'tly.  Pt worked on LUE coordination and strength with moving a washcloth up and down wall in a scrubbing motion placing pressure through his palm.  He also worked on shoulder retraction with isometric contractions to increase upper back strength. Pt resting on bed at end of session with call light in reach.  Visit 2: No c/o pain. Pt's wife present for this session.  Pt completed toileting with mod I and ambulated to ADL apartment with close supervision. Pt sat down on low couch and stood up 10 x with no UE support.  He worked on tub transfers with and without the bench.  Without the bench, he needs to use grab bars but he can do both transfers with supervision.  His wife stated that she was more comfortable with the tub bench. Pt then worked on LUE coordination with isolated strengthening exercises and bilateral PNF patterns with heavy dumbell to focus on his core strength. Pt then went to his PT session.  Therapy Documentation Precautions:  Precautions Precautions: Fall Restrictions Weight Bearing Restrictions: No    Vital Signs: Therapy Vitals Temp: 98 F (36.7 C) Temp src: Oral Pulse Rate: 54 Resp: 16 BP:  112/63 mmHg Patient Position, if appropriate: Lying Oxygen Therapy SpO2: 95 % O2 Device: None (Room air)  Pain Assessment Pain Assessment: No/denies pain Pain Score: 0-No pain ADL:  See FIM for current functional status  Therapy/Group: Individual Therapy  Deneane Stifter 06/10/2013, 9:15 AM

## 2013-06-11 ENCOUNTER — Inpatient Hospital Stay (HOSPITAL_COMMUNITY): Payer: Medicare PPO | Admitting: Physical Therapy

## 2013-06-11 LAB — GLUCOSE, CAPILLARY
Glucose-Capillary: 121 mg/dL — ABNORMAL HIGH (ref 70–99)
Glucose-Capillary: 89 mg/dL (ref 70–99)
Glucose-Capillary: 123 mg/dL — ABNORMAL HIGH (ref 70–99)
Glucose-Capillary: 95 mg/dL (ref 70–99)

## 2013-06-11 NOTE — Progress Notes (Signed)
Physical Therapy Session Note  Patient Details  Name: Thomas Nixon MRN: [id number] Date of Birth: June 07, 1971  Today's Date: 06/11/2013 Time: 1000-1055 Time Calculation (min): 55 min  Short Term Goals: Week 1:  PT Short Term Goal 1 (Week 1): STGs=LTGs secondary to ELOS  Skilled Therapeutic Interventions/Progress Updates:  Pt was seen bedside in the am, sitting up in bed chair. Pt transfers sit to stand with S, ambulates around room with S. Pt ambulated to gym about 150 feet and back from gym about 300 feet without assistive device and S, occasional verbal cues for safety. While in gym, treatment focused on LE strengthening and balance, performed cone taps, criss cross taps, floor targets, step ups and heel raises, 3 sets x 10 reps each.   Therapy Documentation Precautions:  Precautions Precautions: Fall Restrictions Weight Bearing Restrictions: No  Pain: Pain Assessment Pain Assessment: No/denies pain  Locomotion : Ambulation Ambulation/Gait Assistance: 5: Supervision   See FIM for current functional status  Therapy/Group: Individual Therapy  Avant, Printy 06/11/2013, 12:46 PM

## 2013-06-11 NOTE — Progress Notes (Signed)
Subjective/Complaints: No complaints once again. Rested well.  A 12 point review of systems has been performed and if not noted above is otherwise negative.   Objective: Vital Signs: Blood pressure 109/69, pulse 54, temperature 97.9 F (36.6 C), temperature source Oral, resp. rate 16, height 5\' 10"  (1.778 m), weight 105.2 kg (231 lb 14.8 oz), SpO2 96.00%. No results found. No results found for this basename: WBC, HGB, HCT, PLT,  in the last 72 hours No results found for this basename: NA, K, CL, CO, GLUCOSE, BUN, CREATININE, CALCIUM,  in the last 72 hours CBG (last 3)   Recent Labs  06/10/13 1656 06/10/13 2055 06/11/13 0758  GLUCAP 96 97 121*    Wt Readings from Last 3 Encounters:  06/06/13 105.2 kg (231 lb 14.8 oz)  06/01/13 104.9 kg (231 lb 4.2 oz)    Physical Exam:  Constitutional: He is oriented to person, place, and time.  HENT: dentition good. Oral mucosa pink and moist  Head: Normocephalic.  Eyes: EOM are normal.  Neck: Normal range of motion. Neck supple. No thyromegaly present.  Cardiovascular: Normal rate and regular rhythm. No murmurs  Pulmonary/Chest: Effort normal and breath sounds normal. No respiratory distress. No rales, wheezes  Abdominal: Soft. Bowel sounds are normal. He exhibits distension.  Neurological: He is alert and oriented to person, place, and time.  Patient follows three-step command  Skin: Skin is warm and dry.  motor strength 4+ /5 in the left deltoid, bicep, tricep, grip. 4+/5 left hip flexor, knee extensors, ankle dorsi flexion plantar flexor 5/5 in the right side. Left pronator drift  Sensory exam is normal to light touch and pinprick in all 4 limbs.  Speech is dysarthric but intellgibile. Left central 7 and tongue deviation.  Visual fields are intact confrontation testing  Cerebellar testing shows ataxia finger nose to finger in the left upper    Assessment/Plan: 1. Functional deficits secondary to thrombotic right PLIC infarct, hx of  prior CVA's which require 3+ hours per day of interdisciplinary therapy in a comprehensive inpatient rehab setting. Physiatrist is providing close team supervision and 24 hour management of active medical problems listed below. Physiatrist and rehab team continue to assess barriers to discharge/monitor patient progress toward functional and medical goals. FIM: FIM - Bathing Bathing Steps Patient Completed: Chest;Right Arm;Abdomen;Left Arm;Front perineal area;Buttocks;Right upper leg;Left upper leg;Right lower leg (including foot);Left lower leg (including foot) Bathing: 6: More than reasonable amount of time  FIM - Upper Body Dressing/Undressing Upper body dressing/undressing steps patient completed: Thread/unthread right sleeve of pullover shirt/dresss;Thread/unthread left sleeve of pullover shirt/dress;Put head through opening of pull over shirt/dress;Pull shirt over trunk Upper body dressing/undressing: 5: Supervision: Safety issues/verbal cues (supervision only to retrieve clothing from drawers) FIM - Lower Body Dressing/Undressing Lower body dressing/undressing steps patient completed: Thread/unthread right underwear leg;Thread/unthread left underwear leg;Pull underwear up/down;Thread/unthread right pants leg;Thread/unthread left pants leg;Pull pants up/down;Don/Doff right sock;Don/Doff left sock;Don/Doff left shoe;Don/Doff right shoe;Fasten/unfasten right shoe;Fasten/unfasten left shoe Lower body dressing/undressing:  (supervision only to retrieve clothing from drawers)  FIM - Toileting Toileting steps completed by patient: Adjust clothing prior to toileting;Performs perineal hygiene;Adjust clothing after toileting Toileting Assistive Devices: Grab bar or rail for support Toileting: 4: Steadying assist  FIM - Diplomatic Services operational officer Devices: Elevated toilet seat;Grab bars Toilet Transfers: 4-From toilet/BSC: Min A (steadying Pt. > 75%);4-To toilet/BSC: Min A (steadying  Pt. > 75%)  FIM - Bed/Chair Transfer Bed/Chair Transfer Assistive Devices: Arm rests Bed/Chair Transfer: 5: Chair or W/C > Bed:  Supervision (verbal cues/safety issues);5: Bed > Chair or W/C: Supervision (verbal cues/safety issues)  FIM - Locomotion: Wheelchair Locomotion: Wheelchair: 0: Activity did not occur FIM - Locomotion: Ambulation Ambulation/Gait Assistance: 5: Supervision Locomotion: Ambulation: 5: Travels 150 ft or more with supervision/safety issues  Comprehension Comprehension Mode: Auditory Comprehension: 6-Follows complex conversation/direction: With extra time/assistive device  Expression Expression Mode: Verbal Expression: 5-Expresses basic needs/ideas: With no assist  Social Interaction Social Interaction: 6-Interacts appropriately with others with medication or extra time (anti-anxiety, antidepressant).  Problem Solving Problem Solving: 5-Solves basic problems: With no assist  Memory Memory: 6-More than reasonable amt of time  Medical Problem List and Plan:  1. 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 DVT Prophylaxis/Anticoagulation: Subcutaneous Lovenox. Monitor platelet counts and any signs of bleeding  3. Pain Management: Tylenol as needed.  4. Neuropsych: This patient is capable of making decisions on his own behalf.  5. Diabetes mellitus with peripheral neuropathy. Hemoglobin A1c 6.2. Glucophage 1000 mg twice a day. Checking blood sugars a.c. and at bedtime. Cm diet 6. Hyperlipidemia. Lipitor  7. Hypertension. No present antihypertensive medications. Patient on Aldactone 50 mg daily and Norvasc 5 mg daily prior to admission. Normotensive at present 8. FEN: push po fluids, hold aldactone if needed. Will recheck labs tomorrow  LOS (Days) 5 A FACE TO FACE EVALUATION WAS PERFORMED  Derrien Anschutz T 06/11/2013 8:43 AM

## 2013-06-12 ENCOUNTER — Inpatient Hospital Stay (HOSPITAL_COMMUNITY): Payer: Medicare PPO | Admitting: Occupational Therapy

## 2013-06-12 ENCOUNTER — Inpatient Hospital Stay (HOSPITAL_COMMUNITY): Payer: Medicare PPO | Admitting: *Deleted

## 2013-06-12 ENCOUNTER — Inpatient Hospital Stay (HOSPITAL_COMMUNITY): Payer: Medicare PPO | Admitting: Speech Pathology

## 2013-06-12 ENCOUNTER — Inpatient Hospital Stay (HOSPITAL_COMMUNITY): Payer: 59 | Admitting: Physical Therapy

## 2013-06-12 DIAGNOSIS — I10 Essential (primary) hypertension: Secondary | ICD-10-CM

## 2013-06-12 DIAGNOSIS — I6330 Cerebral infarction due to thrombosis of unspecified cerebral artery: Secondary | ICD-10-CM

## 2013-06-12 LAB — BASIC METABOLIC PANEL
BUN: 21 mg/dL (ref 6–23)
CO2: 29 mEq/L (ref 19–32)
Calcium: 9 mg/dL (ref 8.4–10.5)
Chloride: 101 mEq/L (ref 96–112)
Creatinine, Ser: 1.2 mg/dL (ref 0.50–1.35)
GFR calc Af Amer: 87 mL/min — ABNORMAL LOW (ref >90)
GFR calc non Af Amer: 75 mL/min — ABNORMAL LOW (ref >90)
Glucose, Bld: 90 mg/dL (ref 70–99)
Potassium: 3.8 mEq/L (ref 3.5–5.1)
Sodium: 139 mEq/L (ref 135–145)

## 2013-06-12 LAB — GLUCOSE, CAPILLARY
Glucose-Capillary: 91 mg/dL (ref 70–99)
Glucose-Capillary: 77 mg/dL (ref 70–99)
Glucose-Capillary: 93 mg/dL (ref 70–99)
Glucose-Capillary: 148 mg/dL — ABNORMAL HIGH (ref 70–99)

## 2013-06-12 NOTE — Progress Notes (Signed)
Patient ID: Thomas Nixon, male   DOB: June 02, 1971, 42 y.o.   MRN: [id number] Subjective/Complaints: No complaints once again. Rested well. Feels uncoordinated on the left side A 12 point review of systems has been performed and if not noted above is otherwise negative.   Objective: Vital Signs: Blood pressure 110/72, pulse 50, temperature 98 F (36.7 C), temperature source Oral, resp. rate 16, height 5\' 10"  (1.778 m), weight 105.2 kg (231 lb 14.8 oz), SpO2 98.00%. No results found. No results found for this basename: WBC, HGB, HCT, PLT,  in the last 72 hours  Recent Labs  06/12/13 0524  NA 139  K 3.8  CL 101  GLUCOSE 90  BUN 21  CREATININE 1.20  CALCIUM 9.0   CBG (last 3)   Recent Labs  06/11/13 1604 06/11/13 2058 06/12/13 0742  GLUCAP 123* 95 91    Wt Readings from Last 3 Encounters:  06/06/13 105.2 kg (231 lb 14.8 oz)  06/01/13 104.9 kg (231 lb 4.2 oz)    Physical Exam:  Constitutional: He is oriented to person, place, and time.  HENT: dentition good. Oral mucosa pink and moist  Head: Normocephalic.  Eyes: EOM are normal.  Neck: Normal range of motion. Neck supple. No thyromegaly present.  Cardiovascular: Normal rate and regular rhythm. No murmurs  Pulmonary/Chest: Effort normal and breath sounds normal. No respiratory distress. No rales, wheezes  Abdominal: Soft. Bowel sounds are normal. He exhibits distension.  Neurological: He is alert and oriented to person, place, and time.  Patient follows three-step command  Skin: Skin is warm and dry.  motor strength 4+ /5 in the left deltoid, bicep, tricep, grip. 4+/5 left hip flexor, knee extensors, ankle dorsi flexion plantar flexor 5/5 in the right side. Left pronator drift  Sensory exam is normal to light touch and pinprick in all 4 limbs.  Speech is dysarthric but intellgibile. Left central 7 and tongue deviation.  Visual fields are intact confrontation testing  Cerebellar testing shows moderate ataxia finger nose to  finger in the left upper    Assessment/Plan: 1. Functional deficits secondary to thrombotic right PLIC infarct, hx of prior CVA's which require 3+ hours per day of interdisciplinary therapy in a comprehensive inpatient rehab setting. Physiatrist is providing close team supervision and 24 hour management of active medical problems listed below. Physiatrist and rehab team continue to assess barriers to discharge/monitor patient progress toward functional and medical goals. FIM: FIM - Bathing Bathing Steps Patient Completed: Chest;Right Arm;Abdomen;Left Arm;Front perineal area;Buttocks;Right upper leg;Left upper leg;Right lower leg (including foot);Left lower leg (including foot) Bathing: 6: More than reasonable amount of time  FIM - Upper Body Dressing/Undressing Upper body dressing/undressing steps patient completed: Thread/unthread right sleeve of pullover shirt/dresss;Thread/unthread left sleeve of pullover shirt/dress;Put head through opening of pull over shirt/dress;Pull shirt over trunk Upper body dressing/undressing: 5: Supervision: Safety issues/verbal cues (supervision only to retrieve clothing from drawers) FIM - Lower Body Dressing/Undressing Lower body dressing/undressing steps patient completed: Thread/unthread right underwear leg;Thread/unthread left underwear leg;Pull underwear up/down;Thread/unthread right pants leg;Thread/unthread left pants leg;Pull pants up/down;Don/Doff right sock;Don/Doff left sock;Don/Doff left shoe;Don/Doff right shoe;Fasten/unfasten right shoe;Fasten/unfasten left shoe Lower body dressing/undressing:  (supervision only to retrieve clothing from drawers)  FIM - Toileting Toileting steps completed by patient: Adjust clothing prior to toileting;Performs perineal hygiene;Adjust clothing after toileting Toileting Assistive Devices: Grab bar or rail for support Toileting: 4: Steadying assist  FIM - Diplomatic Services operational officer Devices: Elevated  toilet seat;Grab bars Toilet Transfers: 4-From toilet/BSC: Min A (  steadying Pt. > 75%);4-To toilet/BSC: Min A (steadying Pt. > 75%)  FIM - Bed/Chair Transfer Bed/Chair Transfer Assistive Devices: Arm rests Bed/Chair Transfer: 5: Bed > Chair or W/C: Supervision (verbal cues/safety issues);5: Chair or W/C > Bed: Supervision (verbal cues/safety issues)  FIM - Locomotion: Wheelchair Locomotion: Wheelchair: 0: Activity did not occur FIM - Locomotion: Ambulation Locomotion: Ambulation Assistive Devices: Other (comment) (without assistive device) Ambulation/Gait Assistance: 5: Supervision Locomotion: Ambulation: 5: Travels 150 ft or more with supervision/safety issues  Comprehension Comprehension Mode: Auditory Comprehension: 6-Follows complex conversation/direction: With extra time/assistive device  Expression Expression Mode: Verbal Expression: 5-Expresses basic needs/ideas: With no assist  Social Interaction Social Interaction: 6-Interacts appropriately with others with medication or extra time (anti-anxiety, antidepressant).  Problem Solving Problem Solving: 5-Solves basic problems: With no assist  Memory Memory: 6-More than reasonable amt of time  Medical Problem List and Plan:  1. 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 DVT Prophylaxis/Anticoagulation: Subcutaneous Lovenox. Monitor platelet counts and any signs of bleeding  3. Pain Management: Tylenol as needed.  4. Neuropsych: This patient is capable of making decisions on his own behalf.  5. Diabetes mellitus with peripheral neuropathy. Hemoglobin A1c 6.2. Glucophage 1000 mg twice a day. Checking blood sugars a.c. and at bedtime. Cm diet 6. Hyperlipidemia. Lipitor  7. Hypertension. No present antihypertensive medications. Patient on Aldactone 50 mg daily and Norvasc 5 mg daily prior to admission. Normotensive at  present 8. FEN: push po fluids, hold aldactone if needed. Will recheck labs tomorrow  LOS (Days) 6 A FACE TO FACE EVALUATION WAS PERFORMED  Erick Colace 06/12/2013 9:13 AM

## 2013-06-12 NOTE — Progress Notes (Signed)
Speech Language Pathology Daily Session Note  Patient Details  Name: Witten Certain MRN: [id number] Date of Birth: 04/28/1971  Today's Date: 06/12/2013 Time: 4098-1191 Time Calculation (min): 45 min  Short Term Goals: Week 1: SLP Short Term Goal 1 (Week 1): Patient will identify effective speech intelligibility strategies with Mod I  SLP Short Term Goal 2 (Week 1): Patient will utilize speech intelligibility strategies during structured tasks with Mod I  SLP Short Term Goal 3 (Week 1): Patient will utilize speech intelligibility strategies in unstructured tasks with Supervison level verbal cues.  Skilled Therapeutic Interventions: Pt alert and cooperative. Skilled ST services provided with focus on increasing speech intelligibility, through use of compensatory strategies. Therapy conducted in moderately distracting environment  to encourage increased speech volume. Pt required mod verbal cues to slow speech rate and increase intensity at conversational level. Continue POC.   FIM:  Comprehension Comprehension Mode: Auditory Comprehension: 6-Follows complex conversation/direction: With extra time/assistive device Expression Expression Mode: Verbal Expression: 5-Expresses basic needs/ideas: With no assist Social Interaction Social Interaction: 6-Interacts appropriately with others with medication or extra time (anti-anxiety, antidepressant). Problem Solving Problem Solving: 5-Solves basic problems: With no assist Memory Memory: 6-More than reasonable amt of time  Pain Pain Assessment Pain Assessment: No/denies pain Pain Score: 0-No pain  Therapy/Group: Individual Therapy  Brandin Stetzer, Kara Pacer 06/12/2013, 1:12 PM

## 2013-06-12 NOTE — Progress Notes (Signed)
Occupational Therapy Session Note  Patient Details  Name: Thomas Nixon MRN: [id number] Date of Birth: 27-May-1971  Today's Date: 06/12/2013 Time: 0817-0900 and 7829-5621 Time Calculation (min): 43 min and 38 min  Short Term Goals: Week 1:  OT Short Term Goal 1 (Week 1): STG=LTG secondary to ESLOS  Skilled Therapeutic Interventions/Progress Updates:    1) Pt seen for ADL retraining with focus on functional mobility without AD, use of DME for bathing at sit to stand level in shower, and dynamic balance with obtaining items from dresser drawers and floor.  Discussed DME for bathroom, to which pt reports feeling safer with chair or bench for energy conservation and balance. Pt close supervision with obtaining items from dresser and distant supervision with ambulation in room.   Mod I with bathing and dressing tasks at sit to stand level.  Ambulated to ADL apt to conduct tub/shower transfer, supervision with stepping over tub ledge with use of grab bar and mod I with tub bench.  Pt could perform step over transfer with mod I with shower chair for energy conservation, however recommend tub bench for tub/shower transfer to increase safety and independence as well as potential need for future (multiple CVAs). Engaged in weight shifting and single leg stance activity to increase balance reactions and weight shifting necessary for higher level ADLs and volunteering tasks.  Increased challenge with having pt recall 3-4 number sequence with stepping pattern on numbered discs.  2) Pt seen for 1:1 OT with focus on dynamic standing balance, trunk control, and LUE coordination/strengthening.  Engaged in trunk rotation and PNF pattern reaching with 4# medicine ball in standing.  Weight bearing in standing at wall with wall pushups with focus on trunk control and LUE strengthening progressing to tricep wall pushups.  Engaged in trunk rotation and forced use of LUE with hanging towels on clothesline with picking towels off  floor on Rt and hanging on clothes line to Lt with use of LUE with resistive clothes pins.  Pt carried box of towels ~75 feet to opposite end of gym where he folded towels with BUE in standing.  Engaged in therapeutic activity of Nustep while discussing d/c plan and DME for bathroom to increase independence and safety.  Discussed outing with pt and potential goals to focus on in community setting.   Therapy Documentation Precautions:  Precautions Precautions: Fall Restrictions Weight Bearing Restrictions: No General:   Vital Signs: Therapy Vitals Temp: 98 F (36.7 C) Temp src: Oral Pulse Rate: 50 Resp: 16 BP: 110/72 mmHg Patient Position, if appropriate: Lying Oxygen Therapy SpO2: 98 % O2 Device: None (Room air) Pain: Pain Assessment Pain Assessment: No/denies pain Pain Score: 0-No pain  See FIM for current functional status  Therapy/Group: Individual Therapy  Rosalio Loud 06/12/2013, 10:20 AM

## 2013-06-12 NOTE — Progress Notes (Signed)
Physical Therapy Session Note  Patient Details  Name: Thomas Nixon MRN: [id number] Date of Birth: 01/04/1971  Today's Date: 06/12/2013 Time: 0930-1030 Time Calculation (min): 60 min  Short Term Goals: Week 1:  PT Short Term Goal 1 (Week 1): STGs=LTGs secondary to ELOS  Skilled Therapeutic Interventions/Progress Updates:    Patient received sitting edge of bed. Session focused on gait training, community integration, high level balance; see details below.  Patient performed high level gait/coordination activities with agility ladder: Patient completed ladder x4 marching with alternating feet in each square and x2 foot in/in, out/out of each square to facilitate increased lateral weight shifts and side stepping. Patient returned to room and left seated edge of bed with all needs within reach.  Therapy Documentation Precautions:  Precautions Precautions: Fall Restrictions Weight Bearing Restrictions: No Pain: Pain Assessment Pain Assessment: No/denies pain Pain Score: 0-No pain Locomotion : Ambulation Ambulation: Yes Ambulation/Gait Assistance: 6: Modified independent (Device/Increase time); 5: Supervision Ambulation Distance (Feet): 300 Feet Assistive device: None Ambulation/Gait Assistance Details: Patient instructed in gait training 155' x2 without AD in controlled environment (mod I) and >300 several times in community environments (off unit, on/off elevator, in hospital lobby, outside on uneven surfaces including inclines/declines, grass, brick, etc.) with supervision/mod I. Gait Gait: Yes Gait Pattern: Decreased step length - left;Decreased stride length;Decreased dorsiflexion - left High Level Ambulation High Level Ambulation: Other high level ambulation High Level Ambulation - Other Comments: 125'x2 resisted walking/jogging (therapist uses 2 blue therabands to resist patient when walking/jogging forwards) Stairs / Additional Locomotion Stairs: Yes Stairs Assistance: 5:  Supervision Stairs Assistance Details: Verbal cues for precautions/safety Stairs Assistance Details (indicate cue type and reason): Step to ascending, alternating descending Stair Management Technique: One rail Right;Alternating pattern;Forwards Number of Stairs: 12 Height of Stairs: 6   See FIM for current functional status  Therapy/Group: Individual Therapy  Chipper Herb. Macaila Tahir, PT, DPT 06/12/2013, 12:17 PM

## 2013-06-13 ENCOUNTER — Inpatient Hospital Stay (HOSPITAL_COMMUNITY): Payer: Medicare PPO | Admitting: Speech Pathology

## 2013-06-13 ENCOUNTER — Inpatient Hospital Stay (HOSPITAL_COMMUNITY): Payer: Medicare PPO | Admitting: *Deleted

## 2013-06-13 ENCOUNTER — Inpatient Hospital Stay (HOSPITAL_COMMUNITY): Payer: Medicare PPO | Admitting: Occupational Therapy

## 2013-06-13 DIAGNOSIS — I6330 Cerebral infarction due to thrombosis of unspecified cerebral artery: Secondary | ICD-10-CM

## 2013-06-13 DIAGNOSIS — I10 Essential (primary) hypertension: Secondary | ICD-10-CM

## 2013-06-13 LAB — GLUCOSE, CAPILLARY
Glucose-Capillary: 111 mg/dL — ABNORMAL HIGH (ref 70–99)
Glucose-Capillary: 94 mg/dL (ref 70–99)
Glucose-Capillary: 90 mg/dL (ref 70–99)
Glucose-Capillary: 78 mg/dL (ref 70–99)

## 2013-06-13 NOTE — Progress Notes (Signed)
Social Work Patient ID: Thomas Nixon, male   DOB: 12-28-1970, 42 y.o.   MRN: [id number] Team feels pt reaching goals and will be ready for discharge tomorrow.  Pt and wife agreeable to this. Pt to go to OP for continued rehab.  Continue to work toward discharge tomorrow.

## 2013-06-13 NOTE — Progress Notes (Signed)
Speech Language Pathology Daily Session Note  Patient Details  Name: Thomas Nixon MRN: [id number] Date of Birth: 06-09-1971  Today's Date: 06/13/2013 Time: 1030-1200 Time Calculation (min): 90 min  Short Term Goals: Week 1: SLP Short Term Goal 1 (Week 1): Patient will identify effective speech intelligibility strategies with Mod I  SLP Short Term Goal 2 (Week 1): Patient will utilize speech intelligibility strategies during structured tasks with Mod I  SLP Short Term Goal 3 (Week 1): Patient will utilize speech intelligibility strategies in unstructured tasks with Supervison level verbal cues.  Skilled Therapeutic Interventions: Patient alert and cooperative; Skilled ST services provided, focused on use of speech intelligibility strategies. Pt 90% intelligible at conversation level, given min verbal cues. Verbal cueing provided to speak louder and slow speech rate. Pt able to explain speech strategies, independently and self-monitor for correct use of strategies.    FIM:  Comprehension Comprehension Mode: Auditory Comprehension: 6-Follows complex conversation/direction: With extra time/assistive device Expression Expression: 5-Expresses basic needs/ideas: With no assist Social Interaction Social Interaction: 6-Interacts appropriately with others with medication or extra time (anti-anxiety, antidepressant). Problem Solving Problem Solving: 5-Solves basic problems: With no assist Memory Memory: 6-More than reasonable amt of time  Pain Pain Assessment Pain Assessment: No/denies pain Pain Score: 0-No pain  Therapy/Group: Individual Therapy  Valera Vallas, Kara Pacer 06/13/2013, 12:26 PM

## 2013-06-13 NOTE — Progress Notes (Signed)
Physical Therapy Session Note  Patient Details  Name: Thomas Nixon MRN: [id number] Date of Birth: 1971/07/24  Today's Date: 06/13/2013 Time: 1030-1200 Time Calculation (min): 90 min  Pt participated in community outing to [employer] with focus on community mobility (mod I without AD), safety in community, education on energy conservation techniques, and completed van steps and all transfers mod I. Pt with excellent participation.  Therapy Documentation Precautions:  Precautions Precautions: Fall Restrictions Weight Bearing Restrictions: No Pain: Pain Assessment Pain Assessment: No/denies pain Pain Score: 0-No pain  See FIM for current functional status  Therapy/Group: Community Reintegration  Karolee Stamps [facility] 06/13/2013, 12:12 PM

## 2013-06-13 NOTE — Progress Notes (Signed)
Occupational Therapy Session Note  Patient Details  Name: Thomas Nixon MRN: [id number] Date of Birth: 1971/04/27  Today's Date: 06/13/2013 Time: 0832-0922 Time Calculation (min): 50 min  Short Term Goals: Week 1:  OT Short Term Goal 1 (Week 1): STG=LTG secondary to ESLOS  Skilled Therapeutic Interventions/Progress Updates:    Pt completed ADL retraining at overall modified independent level.  Pt gathered all necessary items and carried them in bag to ADL apartment to complete bathing at tub/shower level.  Pt demonstrated ability to step over tub ledge into shower, however prefers use of tub bench and wife requested tub bench for increased safety and independence.  Pt completed bathing and dressing at sit to stand level mod I and completed dressing in bathroom with increased time for TEDs.  Pt gathered clothing and towels from floor and ambulated back to room while carrying items.    Therapy Documentation Precautions:  Precautions Precautions: Fall Restrictions Weight Bearing Restrictions: No General:   Vital Signs: Therapy Vitals Temp: 98.4 F (36.9 C) Temp src: Oral Pulse Rate: 61 Resp: 18 BP: 102/68 mmHg Patient Position, if appropriate: Sitting Oxygen Therapy SpO2: 99 % O2 Device: None (Room air) Pain: Pain Assessment Pain Assessment: No/denies pain ADL: ADL Grooming: Independent Where Assessed-Grooming: Standing at sink Upper Body Bathing: Modified independent Where Assessed-Upper Body Bathing: Shower Lower Body Bathing: Modified independent Where Assessed-Lower Body Bathing: Shower Upper Body Dressing: Modified independent (Device) Where Assessed-Upper Body Dressing: Chair Lower Body Dressing: Modified independent Where Assessed-Lower Body Dressing: Chair Toileting: Modified independent Where Assessed-Toileting: Teacher, adult education: Engineer, agricultural Method: Proofreader: Engineer, technical sales: Modified  independent Web designer Method: Occupational hygienist: Insurance underwriter: Modified independent Film/video editor Method: Designer, industrial/product: Shower seat with back  See FIM for current functional status  Therapy/Group: Individual Therapy  Kaileen Bronkema 06/13/2013, 9:30 AM

## 2013-06-13 NOTE — Progress Notes (Signed)
Patient ID: Thomas Nixon, male   DOB: Jun 15, 1971, 42 y.o.   MRN: [id number] Subjective/Complaints: No complaints once again. Rested well.  A 12 point review of systems has been performed and if not noted above is otherwise negative.   Objective: Vital Signs: Blood pressure 102/68, pulse 61, temperature 98.4 F (36.9 C), temperature source Oral, resp. rate 18, height 5\' 10"  (1.778 m), weight 105.2 kg (231 lb 14.8 oz), SpO2 99.00%. No results found. No results found for this basename: WBC, HGB, HCT, PLT,  in the last 72 hours  Recent Labs  06/12/13 0524  NA 139  K 3.8  CL 101  GLUCOSE 90  BUN 21  CREATININE 1.20  CALCIUM 9.0   CBG (last 3)   Recent Labs  06/12/13 2039 06/13/13 0731 06/13/13 1208  GLUCAP 148* 111* 94    Wt Readings from Last 3 Encounters:  06/06/13 105.2 kg (231 lb 14.8 oz)  06/01/13 104.9 kg (231 lb 4.2 oz)    Physical Exam:  Constitutional: He is oriented to person, place, and time.  HENT: dentition good. Oral mucosa pink and moist  Head: Normocephalic.  Eyes: EOM are normal.  Neck: Normal range of motion. Neck supple. No thyromegaly present.  Cardiovascular: Normal rate and regular rhythm. No murmurs  Pulmonary/Chest: Effort normal and breath sounds normal. No respiratory distress. No rales, wheezes  Abdominal: Soft. Bowel sounds are normal. He exhibits distension.  Neurological: He is alert and oriented to person, place, and time.  Patient follows three-step command  Skin: Skin is warm and dry.  motor strength 4+ /5 in the left deltoid, bicep, tricep, grip. 4+/5 left hip flexor, knee extensors, ankle dorsi flexion plantar flexor 5/5 in the right side. Left pronator drift  Sensory exam is normal to light touch and pinprick in all 4 limbs.  Speech is dysarthric but intellgibile. Left central 7 and tongue deviation.  Visual fields are intact confrontation testing  Cerebellar testing shows moderate ataxia finger nose to finger in the left upper     Assessment/Plan: 1. Functional deficits secondary to thrombotic right PLIC infarct, hx of prior CVA's which require 3+ hours per day of interdisciplinary therapy in a comprehensive inpatient rehab setting. Physiatrist is providing close team supervision and 24 hour management of active medical problems listed below. Anticipate ready for discharge tomorrow FIM: FIM - Bathing Bathing Steps Patient Completed: Chest;Right Arm;Abdomen;Left Arm;Front perineal area;Buttocks;Right upper leg;Left upper leg;Right lower leg (including foot);Left lower leg (including foot) Bathing: 6: More than reasonable amount of time  FIM - Upper Body Dressing/Undressing Upper body dressing/undressing steps patient completed: Thread/unthread right sleeve of pullover shirt/dresss;Thread/unthread left sleeve of pullover shirt/dress;Put head through opening of pull over shirt/dress;Pull shirt over trunk Upper body dressing/undressing: 6: More than reasonable amount of time FIM - Lower Body Dressing/Undressing Lower body dressing/undressing steps patient completed: Thread/unthread right underwear leg;Thread/unthread left underwear leg;Pull underwear up/down;Thread/unthread right pants leg;Thread/unthread left pants leg;Pull pants up/down;Don/Doff right sock;Don/Doff left sock;Don/Doff left shoe;Don/Doff right shoe;Fasten/unfasten right shoe;Fasten/unfasten left shoe Lower body dressing/undressing: 6: More than reasonable amount of time  FIM - Toileting Toileting steps completed by patient: Adjust clothing prior to toileting;Performs perineal hygiene;Adjust clothing after toileting Toileting Assistive Devices: Grab bar or rail for support Toileting: 6: Assistive device: No helper  FIM - Diplomatic Services operational officer Devices: Elevated toilet seat;Grab bars Toilet Transfers: 6-To toilet/ BSC;6-From toilet/BSC;6-More than reasonable amt of time  FIM - Banker  Devices: Arm rests Bed/Chair Transfer: 6: Bed >  Chair or W/C: No assist;6: Chair or W/C > Bed: No assist  FIM - Locomotion: Wheelchair Locomotion: Wheelchair: 0: Activity did not occur FIM - Locomotion: Ambulation Locomotion: Ambulation Assistive Devices: Other (comment) (none) Ambulation/Gait Assistance: 6: Modified independent (Device/Increase time);5: Supervision Locomotion: Ambulation: 5: Travels 150 ft or more with supervision/safety issues  Comprehension Comprehension Mode: Auditory Comprehension: 6-Follows complex conversation/direction: With extra time/assistive device  Expression Expression Mode: Verbal Expression: 5-Expresses basic needs/ideas: With no assist  Social Interaction Social Interaction: 6-Interacts appropriately with others with medication or extra time (anti-anxiety, antidepressant).  Problem Solving Problem Solving: 5-Solves basic problems: With no assist  Memory Memory: 6-More than reasonable amt of time  Medical Problem List and Plan:  1. 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 DVT Prophylaxis/Anticoagulation: Subcutaneous Lovenox. Monitor platelet counts and any signs of bleeding  3. Pain Management: Tylenol as needed.  4. Neuropsych: This patient is capable of making decisions on his own behalf.  5. Diabetes mellitus with peripheral neuropathy. Hemoglobin A1c 6.2. Glucophage 1000 mg twice a day. Checking blood sugars a.c. and at bedtime. Cm diet 6. Hyperlipidemia. Lipitor  7. Hypertension. No present antihypertensive medications. Patient on Aldactone 50 mg daily and Norvasc 5 mg daily prior to admission. Normotensive at present 8. FEN: push po fluids, hold aldactone if needed. Will recheck labs tomorrow  LOS (Days) 7 A FACE TO FACE EVALUATION WAS PERFORMED  Claudette Laws E 06/13/2013 1:01 PM

## 2013-06-13 NOTE — Progress Notes (Signed)
Physical Therapy Discharge Summary  Patient Details  Name: Teven Mittman MRN: [id number] Date of Birth: 1971/03/20  Today's Date: 06/13/2013 Time: 4098-1191 Time Calculation (min): 32 min  Patient has met 11 of 11 long term goals due to improved activity tolerance, improved balance, improved postural control, ability to compensate for deficits and improved coordination.  Patient to discharge at an ambulatory level Modified Independent.   Patient's care partner is not necessary to provide the necessary assistance at discharge secondary to patient discharging at mod I level.  Reasons goals not met: N/A, all LTGs met  Recommendation:  Patient will benefit from ongoing skilled PT services in outpatient setting to continue to advance safe functional mobility, address ongoing impairments in strength, balance, coordination, gait, activity tolerance, postural control, and minimize fall risk.  Equipment: No equipment provided  Reasons for discharge: treatment goals met and discharge from hospital  Patient/family agrees with progress made and goals achieved: Yes  PT Discharge Precautions/Restrictions Precautions Precautions: None Restrictions Weight Bearing Restrictions: No Pain Pain Assessment Pain Assessment: No/denies pain Pain Score: 0-No pain Vision/Perception  Vision - History Baseline Vision: Wears glasses only for reading Patient Visual Report: No change from baseline  Cognition Overall Cognitive Status: History of cognitive impairments - at baseline Arousal/Alertness: Awake/alert Orientation Level: Oriented X4 Awareness: Appears intact Problem Solving: Appears intact Safety/Judgment: Appears intact Sensation Sensation Light Touch: Appears Intact Proprioception: Appears Intact Additional Comments: Proprioception intact at B ankles and great toes. 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)  Mobility Bed Mobility Bed Mobility: Supine to Sit;Sit to Supine;Sitting - Scoot to Edge of Bed Supine to Sit: 6: Modified independent (Device/Increase time);HOB flat Sitting - Scoot to Edge of Bed: 6: Modified independent (Device/Increase time) Sit to Supine: 6: Modified independent (Device/Increase time);HOB flat Transfers Sit to Stand: 6: Modified independent (Device/Increase time);From bed;From chair/3-in-1;From toilet;With upper extremity assist;Without upper extremity assist Stand to Sit: 6: Modified independent (Device/Increase time);To bed;To chair/3-in-1;To toilet;With upper extremity assist;Without upper extremity assist Stand Pivot Transfers: 6: Modified independent (Device/Increase time) Locomotion  Ambulation Ambulation: Yes Ambulation/Gait Assistance: 6: Modified independent (Device/Increase time) Ambulation Distance (Feet): 300 Feet Assistive device: None Ambulation/Gait Assistance Details: Patient performed gait training >300' several times in controlled, home, and community environments (patient participated in outing to [employer]-see outing goal sheet in shadow chart for details). Gait Gait: Yes Gait Pattern: Decreased step length - left;Decreased stride length;Decreased dorsiflexion - left Stairs / Additional Locomotion Stairs: Yes Stairs Assistance: 6: Modified independent (Device/Increase time) Stair Management Technique: One rail Right;Alternating pattern;Forwards Number of Stairs: 16 Height of Stairs: 6 Curb: 6: Modified independent (Device/increase time) Wheelchair Mobility Wheelchair Mobility: No  Trunk/Postural Assessment  Cervical Assessment Cervical Assessment: Within Functional  Limits Thoracic Assessment Thoracic Assessment: Within Functional Limits Lumbar Assessment Lumbar Assessment: Within Functional Limits Postural Control Postural Control: Within Functional Limits  Balance Balance Balance Assessed: Yes Standardized Balance Assessment Standardized Balance Assessment: Berg Balance Test Berg Balance Test Sit to Stand: Able to stand without using hands and stabilize independently Standing Unsupported: Able to stand safely 2 minutes Sitting with Back Unsupported but Feet Supported on Floor or Stool: Able to sit safely and securely 2 minutes Stand to Sit: Sits safely with minimal use of  hands Transfers: Able to transfer safely, minor use of hands Standing Unsupported with Eyes Closed: Able to stand 10 seconds safely Standing Ubsupported with Feet Together: Able to place feet together independently and stand 1 minute safely From Standing, Reach Forward with Outstretched Arm: Can reach confidently >25 cm (10") From Standing Position, Pick up Object from Floor: Able to pick up shoe safely and easily From Standing Position, Turn to Look Behind Over each Shoulder: Looks behind from both sides and weight shifts well Turn 360 Degrees: Able to turn 360 degrees safely in 4 seconds or less Standing Unsupported, Alternately Place Feet on Step/Stool: Able to stand independently and safely and complete 8 steps in 20 seconds Standing Unsupported, One Foot in Front: Able to place foot tandem independently and hold 30 seconds Standing on One Leg: Able to lift leg independently and hold > 10 seconds Total Score: 56 Extremity Assessment  RLE Assessment RLE Assessment: Within Functional Limits LLE Assessment LLE Assessment: Within Functional Limits  See FIM for current functional status  Damir Leung S Evoleht Hovatter S. Lugene Hitt, PT, DPT  06/13/2013, 4:05 PM

## 2013-06-13 NOTE — Progress Notes (Signed)
Occupational Therapy Discharge Summary  Patient Details  Name: Nyxon Strupp MRN: [id number] Date of Birth: 1971-05-15  Today's Date: 06/13/2013  Patient has met 11 of 11 long term goals due to improved activity tolerance, improved balance, postural control, ability to compensate for deficits, functional use of  LEFT upper extremity, improved awareness and improved coordination.  Patient to discharge at overall Modified Independent level.  Patient's care partner is independent to provide the necessary intermittent assistance at discharge.    Reasons goals not met: N/A  Recommendation:  Patient will benefit from ongoing skilled OT services in outpatient setting to continue to advance functional skills in the area of BADL, iADL and Reduce care partner burden.  Equipment: tub bench  Reasons for discharge: treatment goals met and discharge from hospital  Patient/family agrees with progress made and goals achieved: Yes  OT Discharge Precautions/Restrictions  Precautions Precautions: Fall General   Vital Signs Therapy Vitals Temp: 98.4 F (36.9 C) Temp src: Oral Pulse Rate: 61 Resp: 18 BP: 102/68 mmHg Patient Position, if appropriate: Sitting Oxygen Therapy SpO2: 99 % O2 Device: None (Room air) Pain Pain Assessment Pain Assessment: No/denies pain ADL ADL Grooming: Independent Where Assessed-Grooming: Standing at sink Upper Body Bathing: Modified independent Where Assessed-Upper Body Bathing: Shower Lower Body Bathing: Modified independent Where Assessed-Lower Body Bathing: Shower Upper Body Dressing: Modified independent (Device) Where Assessed-Upper Body Dressing: Chair Lower Body Dressing: Modified independent Where Assessed-Lower Body Dressing: Chair Toileting: Modified independent Where Assessed-Toileting: Teacher, adult education: Engineer, agricultural Method: Proofreader: Engineer, technical sales: Modified  independent Web designer Method: Occupational hygienist: Insurance underwriter: Modified independent Film/video editor Method: Designer, industrial/product: Information systems manager with back Vision/Perception  Vision - History Baseline Vision: Wears glasses only for reading Patient Visual Report: No change from baseline Vision - Assessment Eye Alignment: Within Functional Limits  Cognition Arousal/Alertness: Awake/alert Orientation Level: Oriented X4 Awareness: Appears intact Problem Solving: Appears intact Safety/Judgment: Appears intact Sensation Sensation Light Touch: Appears Intact Hot/Cold: Appears Intact Proprioception: Appears Intact Coordination Fine Motor Movements are Fluid and Coordinated: Yes Extremity/Trunk Assessment RUE Assessment RUE Assessment: Within Functional Limits LUE Assessment LUE Assessment: Exceptions to [facility] LUE Strength LUE Overall Strength Comments: 4/5 overall  See FIM for current functional status  Kissa Campoy 06/13/2013, 9:30 AM

## 2013-06-13 NOTE — Discharge Summary (Signed)
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 Subcutaneous Lovenox for deep venous thrombosis prophylaxis, pain management, diabetes mellitus, peripheral neuropathy, hyperlipidemia, and hypertension.  HISTORY OF PRESENT ILLNESS:  This is a 42 year old right-handed male with history of hypertension, diabetes mellitus, as well as CVA with residual left-sided weakness, maintained on Aggrenox.  The patient was independent prior to admission, living with his wife, had a full workup in the past for noted CVAs at outside hospitals, maintained on Aggrenox. Admitted June 01, 2013, with left facial droop, which developed while on a cruise in [place].  MRI was completed at outside hospital, not made available, requested to come back to [facility] for ongoing workup where MRI of the brain showed extensive chronic ischemic changes as well as acute subacute infarct in the posterior limb internal capsule and deep white matter in the right frontal parietal lobe.  MRA of the head negative for aneurysm.  No significant intracranial stenosis. Echocardiogram with ejection fraction of 55%.  No wall motion abnormalities.  Carotid Dopplers with less than 39% ICA stenosis.  The patient did not receive tPA.  Neurology Service was consulted, placed on Plavix therapy for CVA prophylaxis as well as subcutaneous Lovenox for DVT prophylaxis.  Physical occupational therapy ongoing.  The patient was admitted for comprehensive rehab program.  PAST MEDICAL HISTORY:  See discharge diagnoses.  SOCIAL  HISTORY:  Lives with spouse.  FUNCTIONAL HISTORY:  Prior to admission independent with mild residual left-sided weakness.  Functional mobility upon admission to rehab services was +2 total assist ambulate 1 foot.  PHYSICAL EXAMINATION:  VITAL SIGNS:  Blood pressure 114/78, pulse 55, respirations 18, temperature 98.7.  This was an alert male, oriented x3. Pupils round and reactive to light, followed full commands. LUNGS:  Clear to auscultation. CARDIAC:  Regular rate and rhythm. ABDOMEN:  Soft, nontender.  Good bowel sounds.  REHABILITATION HOSPITAL COURSE:  The patient was admitted to inpatient rehab services with therapies initiated on a 3-hour daily basis consisting of physical therapy, occupational therapy, speech therapy, and rehabilitation nursing.  The following issues were addressed during the patient's rehabilitation stay.  Pertaining to Mr. Carlberg thrombotic right CVA as well as history of multiple infarcts, remained on Plavix therapy as per Neurology Services.  Subcutaneous Lovenox for DVT prophylaxis.  He did have a history of diabetes mellitus, peripheral neuropathy.  Hemoglobin A1c of 6.2.  He remained on Glucophage 1000 mg twice daily.  He continued on Lipitor for history of hyperlipidemia.  He did have a history of hypertension on no current antihypertensive medications.  Pressures were monitored closely for any increased perfusion.  Diastolic pressures 68-71, systolic pressures 102-105.  He had been on Aldactone and Norvasc prior to hospital admission, would follow up with his primary MD for  ongoing medical management.  The patient received weekly collaborative interdisciplinary team conferences to discuss estimated length of stay, family teaching, and any barriers to discharge.  He was now modified independent without an assistive device.  Working on issues in regards to his safety in the community, education on energy conservation techniques.  The patient was able  to gather his own clothing for activities of daily living.  He was able to complete bathing and tub shower transfers at a modified independent level.  Full family teaching was completed with his wife and it was discussed no driving.  Plans to be discharged to home.  DISCHARGE MEDICATIONS:  At the time of dictation included: 1. Lipitor 40 mg p.o. daily. 2. Glucophage XR 1000 mg p.o. b.i.d. 3. Lyrica 150 mg p.o. B.i.d.. 4.Plavix 75 mg daily  DIET:  Diabetic diet.  SPECIAL INSTRUCTIONS:  The patient would follow up with Cain Saupe, MD on June 26, 2013; Erick Colace, M.D. on July 10, 2013; Pramod P. Pearlean Brownie, MD, Neurology Services in 1 month, call for appointment. Ongoing therapies were arranged as per [employer].  The patient was advised no driving.     Mariam Dollar, P.A.   ______________________________ Erick Colace, M.D.    DA/MEDQ  D:  06/13/2013  T:  06/13/2013  Job:  [id number]  cc:   Cain Saupe, MD Pramod P. Pearlean Brownie, MD

## 2013-06-13 NOTE — Discharge Summary (Signed)
  Discharge summary job # [id number]

## 2013-06-14 LAB — GLUCOSE, CAPILLARY
Glucose-Capillary: 86 mg/dL (ref 70–99)
Glucose-Capillary: 102 mg/dL — ABNORMAL HIGH (ref 70–99)

## 2013-06-14 MED ORDER — PREGABALIN 150 MG PO CAPS: 150.0000 mg | ORAL_CAPSULE | Freq: Two times a day (BID) | ORAL | Status: DC

## 2013-06-14 MED ORDER — CLOPIDOGREL BISULFATE 75 MG PO TABS: 75.0000 mg | ORAL_TABLET | Freq: Every day | ORAL | Status: DC

## 2013-06-14 MED ORDER — METFORMIN HCL ER (MOD) 1000 MG PO TB24: 1000.0000 mg | ORAL_TABLET | Freq: Two times a day (BID) | ORAL | Status: DC

## 2013-06-14 MED ORDER — ATORVASTATIN CALCIUM 40 MG PO TABS: 40.0000 mg | ORAL_TABLET | Freq: Every day | ORAL | Status: DC

## 2013-06-14 NOTE — Progress Notes (Signed)
Patient discharged to home with wife at 31. Discharge instructions provided by Deatra Ina, PA. Patient and wife verbalized undertstanding, no further questions at discharge. Hedy Camara

## 2013-06-14 NOTE — Patient Care Conference (Signed)
Inpatient RehabilitationTeam Conference and Plan of Care Update Date: 06/14/2013   Time: 11:00 Am    Patient Name: Thomas Nixon      Medical Record Number: [id number]  Date of Birth: 1971/11/07 Sex: Male         Room/Bed: 4W17C/4W17C-01 Payor Info: Payor: [employer] / Plan: [facility] CHOICE CARE / Product Type: *No Product type* /    Admitting Diagnosis: R CVA  Admit Date/Time:  06/06/2013  6:01 PM Admission Comments: No comment available   Primary Diagnosis:  Thrombotic cerebral infarction Principal Problem: Thrombotic cerebral infarction  Patient Active Problem List   Diagnosis Date Noted  . 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 Stroke 06/01/2013  . Dysarthria 06/01/2013  . Hypertension 06/01/2013    Expected Discharge Date: Expected Discharge Date: 06/14/13  Team Members Present: Physician leading conference: Dr. Claudette Laws Social Worker Present: Dossie Der, LCSW PT Present: Wanda Plump, Varney Biles, PT;Other (comment) Clarisse Gouge Ripa-PT  Clearnce Hasten) OT Present: Other (comment);Leonette Monarch, Felipa Eth, OT SLP Present: Fae Pippin, SLP Other (Discipline and Name):  Hilda Lias Nole-PPS)     Current Status/Progress Goal Weekly Team Focus  Medical   Medically stable  Set up post discharge care  See above   Bowel/Bladder   cont of B/B LBM- 06-13-13  Regular bowel pattern (mod I)  continue to be cont of B/B.   Swallow/Nutrition/ Hydration   WFL         ADL's   Mod I self-care and higher level ADLs  Mod I overall  high level balance, core/trunk NMR, activity tolerance   Mobility   Mod I all mobility (gait >300' and 16 stairs with one handrail)  Mod I all mobility  safety, strengthening, high level balance, core/trunk NMR, gait, stairs, community integration, simulated volunteer activities, activity tolerance   Communication   Min assist  Supervision   increase self-monitoring and correcting   Safety/Cognition/ Behavioral Observations  baseline          Pain   no c/o pain or discomfort this shift.  Pain level < or = 3  monitor and assess for pain medicate as needed.   Skin      no further skin breakdown/infection  continue to monitor and assess skin q shift treat as needed      *See Care Plan and progress notes for long and short-term goals.  Barriers to Discharge: None    Possible Resolutions to Barriers:  Discharge today    Discharge Planning/Teaching Needs:  HOme with wife who works during the day-reached mod/i elvel      Team Discussion:  Pt reached mod/i level goals and is ready for d/c.  Going to OP therapies-outing went well yesterday  Revisions to Treatment Plan:  None   Continued Need for Acute Rehabilitation Level of Care: The patient requires daily medical management by a physician with specialized training in physical medicine and rehabilitation for the following conditions: Daily direction of a multidisciplinary physical rehabilitation program to ensure safe treatment while eliciting the highest outcome that is of practical value to the patient.: Yes Daily medical management of patient stability for increased activity during participation in an intensive rehabilitation regime.: Yes Daily analysis of laboratory values and/or radiology reports with any subsequent need for medication adjustment of medical intervention for : Neurological problems  Marrie Chandra, Lemar Livings 06/14/2013, 1:07 PM

## 2013-06-14 NOTE — Progress Notes (Signed)
Social Work Discharge Note Discharge Note  The overall goal for the admission was met for:   Discharge location: Yes-HOME WITH WIFE WHO WORKS DURING THE DAY  Length of Stay: Yes-8 DAYS  Discharge activity level: Yes-MOD/I LEVEL  Home/community participation: Yes  Services provided included: MD, RD, PT, OT, SLP, RN, TR, Pharmacy and SW  Financial Services: Private Insurance: [employer] MEDICARE  Follow-up services arranged: Outpatient: [facility]PT,OT,SPT 7/28 10;30-12;15 and DME: [facility]  Comments (or additional information):  Patient/Family verbalized understanding of follow-up arrangements: Yes  Individual responsible for coordination of the follow-up plan: CYNTHIA-WIFE AND PT  Confirmed correct DME delivered: Lucy Chris 06/14/2013    Lucy Chris

## 2013-06-14 NOTE — Progress Notes (Signed)
Speech Language Pathology Discharge Summary  Patient Details  Name: Thomas Nixon MRN: [id number] Date of Birth: 1971-09-09  Today's Date: 06/14/2013  Patient has met 1 of 2 long term goals.  Patient to discharge at overall Modified Independent;Min level.  Reasons goals not met: n/a   Clinical Impression/Discharge Summary: Patient met 1 out of 2 long term goals this reporting period due to gains in ability to utilize compensatory strategies for word finding as well as to increase speech intelligibility.  Patient progressed from Min assist to Mod I with word finding in conversation and Mod assist to Min with self-monitoring and correcting rate of speech and vocal intensity in unstructured conversations.  As a result, it is recommended that this patient continues to receive skilled SLP services to maximize functional communication abilities.   Care Partner:  Caregiver Able to Provide Assistance:  (n/a)  Type of Caregiver Assistance:  (n/a)  Recommendation:  Outpatient SLP  Rationale for SLP Follow Up: Maximize functional communication   Equipment: none   Reasons for discharge: Treatment goals met;Discharged from hospital   Patient/Family Agrees with Progress Made and Goals Achieved: Yes   See FIM for current functional status  [name]., CCC-SLP [phone]  [name] 06/14/2013, 9:26 AM

## 2013-06-14 NOTE — Progress Notes (Signed)
Patient ID: Thomas Nixon, male   DOB: 19-Jul-1971, 42 y.o.   MRN: [id number] Subjective/Complaints: No complaints once again. Rested well.  A 12 point review of systems has been performed and if not noted above is otherwise negative.   Objective: Vital Signs: Blood pressure 125/85, pulse 66, temperature 98.6 F (37 C), temperature source Oral, resp. rate 19, height 5\' 10"  (1.778 m), weight 105.2 kg (231 lb 14.8 oz), SpO2 100.00%. No results found. No results found for this basename: WBC, HGB, HCT, PLT,  in the last 72 hours  Recent Labs  06/12/13 0524  NA 139  K 3.8  CL 101  GLUCOSE 90  BUN 21  CREATININE 1.20  CALCIUM 9.0   CBG (last 3)   Recent Labs  06/13/13 1541 06/13/13 2045 06/14/13 0740  GLUCAP 90 78 86    Wt Readings from Last 3 Encounters:  06/06/13 105.2 kg (231 lb 14.8 oz)  06/01/13 104.9 kg (231 lb 4.2 oz)    Physical Exam:  Constitutional: He is oriented to person, place, and time.  HENT: dentition good. Oral mucosa pink and moist  Head: Normocephalic.  Eyes: EOM are normal.  Neck: Normal range of motion. Neck supple. No thyromegaly present.  Cardiovascular: Normal rate and regular rhythm. No murmurs  Pulmonary/Chest: Effort normal and breath sounds normal. No respiratory distress. No rales, wheezes  Abdominal: Soft. Bowel sounds are normal. He exhibits distension.  Neurological: He is alert and oriented to person, place, and time.  Patient follows three-step command  Skin: Skin is warm and dry.  motor strength 4+ /5 in the left deltoid, bicep, tricep, grip. 4+/5 left hip flexor, knee extensors, ankle dorsi flexion plantar flexor 5/5 in the right side. Left pronator drift  Sensory exam is normal to light touch and pinprick in all 4 limbs.  Speech is dysarthric but intellgibile. Left central 7 and tongue deviation.  Visual fields are intact confrontation testing  Cerebellar testing shows moderate ataxia finger nose to finger in the left upper     Assessment/Plan: 1. Functional deficits secondary to thrombotic right PLIC infarct, hx of prior CVA's which require 3+ hours per day of interdisciplinary therapy in a comprehensive inpatient rehab setting. Stable for D/C today F/u PCP in 1-2 weeks F/u PM&R 3 weeks See D/C summary See D/C instructions FIM: FIM - Bathing Bathing Steps Patient Completed: Chest;Right Arm;Abdomen;Left Arm;Front perineal area;Buttocks;Right upper leg;Left upper leg;Right lower leg (including foot);Left lower leg (including foot) Bathing: 6: More than reasonable amount of time  FIM - Upper Body Dressing/Undressing Upper body dressing/undressing steps patient completed: Thread/unthread right sleeve of pullover shirt/dresss;Thread/unthread left sleeve of pullover shirt/dress;Put head through opening of pull over shirt/dress;Pull shirt over trunk Upper body dressing/undressing: 6: More than reasonable amount of time FIM - Lower Body Dressing/Undressing Lower body dressing/undressing steps patient completed: Thread/unthread right underwear leg;Thread/unthread left underwear leg;Pull underwear up/down;Thread/unthread right pants leg;Thread/unthread left pants leg;Pull pants up/down;Don/Doff right sock;Don/Doff left sock;Don/Doff left shoe;Don/Doff right shoe;Fasten/unfasten right shoe;Fasten/unfasten left shoe Lower body dressing/undressing: 6: More than reasonable amount of time  FIM - Toileting Toileting steps completed by patient: Adjust clothing prior to toileting;Performs perineal hygiene;Adjust clothing after toileting Toileting Assistive Devices: Grab bar or rail for support Toileting: 6: Assistive device: No helper  FIM - Diplomatic Services operational officer Devices: Elevated toilet seat;Grab bars Toilet Transfers: 6-To toilet/ BSC;6-From toilet/BSC;6-More than reasonable amt of time  FIM - Banker Devices: Arm rests Bed/Chair Transfer: 6: More than  reasonable amt  of time;6: Supine > Sit: No assist;6: Sit > Supine: No assist;6: Bed > Chair or W/C: No assist;6: Chair or W/C > Bed: No assist  FIM - Locomotion: Wheelchair Locomotion: Wheelchair: 0: Activity did not occur FIM - Locomotion: Ambulation Locomotion: Ambulation Assistive Devices: Other (comment) (none) Ambulation/Gait Assistance: 6: Modified independent (Device/Increase time) Locomotion: Ambulation: 6: Travels 150 ft or more independently/takes more than reasonable amount of time  Comprehension Comprehension Mode: Auditory Comprehension: 6-Follows complex conversation/direction: With extra time/assistive device  Expression Expression Mode: Verbal Expression: 5-Expresses basic needs/ideas: With no assist  Social Interaction Social Interaction: 6-Interacts appropriately with others with medication or extra time (anti-anxiety, antidepressant).  Problem Solving Problem Solving: 5-Solves basic problems: With no assist  Memory Memory: 6-More than reasonable amt of time  Medical Problem List and Plan:  1. 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 DVT Prophylaxis/Anticoagulation: Subcutaneous Lovenox. Monitor platelet counts and any signs of bleeding  3. Pain Management: Tylenol as needed.  4. Neuropsych: This patient is capable of making decisions on his own behalf.  5. Diabetes mellitus with peripheral neuropathy. Hemoglobin A1c 6.2. Glucophage 1000 mg twice a day. Checking blood sugars a.c. and at bedtime. Cm diet 6. Hyperlipidemia. Lipitor  7. Hypertension. No present antihypertensive medications. Patient on Aldactone 50 mg daily and Norvasc 5 mg daily prior to admission. Normotensive at present 8. FEN: push po fluids, hold aldactone if needed. Will recheck labs tomorrow  LOS (Days) 8 A FACE TO FACE EVALUATION WAS PERFORMED  Erick Colace 06/14/2013  10:17 AM

## 2013-06-19 ENCOUNTER — Ambulatory Visit: Payer: Medicare PPO | Attending: Physical Medicine & Rehabilitation

## 2013-06-19 ENCOUNTER — Ambulatory Visit: Payer: 59 | Admitting: Physical Therapy

## 2013-06-19 ENCOUNTER — Ambulatory Visit: Payer: 59

## 2013-06-19 ENCOUNTER — Ambulatory Visit: Payer: Medicare PPO | Admitting: Physical Therapy

## 2013-06-19 DIAGNOSIS — R269 Unspecified abnormalities of gait and mobility: Secondary | ICD-10-CM | POA: Insufficient documentation

## 2013-06-19 DIAGNOSIS — M6281 Muscle weakness (generalized): Secondary | ICD-10-CM | POA: Insufficient documentation

## 2013-06-19 DIAGNOSIS — R279 Unspecified lack of coordination: Secondary | ICD-10-CM | POA: Insufficient documentation

## 2013-06-19 DIAGNOSIS — Z5189 Encounter for other specified aftercare: Secondary | ICD-10-CM | POA: Insufficient documentation

## 2013-06-19 DIAGNOSIS — R4189 Other symptoms and signs involving cognitive functions and awareness: Secondary | ICD-10-CM | POA: Insufficient documentation

## 2013-06-19 DIAGNOSIS — I69959 Hemiplegia and hemiparesis following unspecified cerebrovascular disease affecting unspecified side: Secondary | ICD-10-CM | POA: Insufficient documentation

## 2013-06-19 DIAGNOSIS — R5381 Other malaise: Secondary | ICD-10-CM | POA: Insufficient documentation

## 2013-06-19 DIAGNOSIS — I69922 Dysarthria following unspecified cerebrovascular disease: Secondary | ICD-10-CM | POA: Insufficient documentation

## 2013-06-21 ENCOUNTER — Ambulatory Visit: Payer: 59 | Admitting: Occupational Therapy

## 2013-06-21 ENCOUNTER — Ambulatory Visit: Payer: Medicare PPO | Admitting: Occupational Therapy

## 2013-06-27 ENCOUNTER — Ambulatory Visit: Payer: Medicare PPO | Admitting: Physical Therapy

## 2013-06-27 ENCOUNTER — Ambulatory Visit: Payer: Medicare PPO | Admitting: Occupational Therapy

## 2013-06-29 ENCOUNTER — Ambulatory Visit: Payer: Medicare PPO | Attending: Physical Medicine & Rehabilitation | Admitting: Physical Therapy

## 2013-06-29 ENCOUNTER — Telehealth: Payer: Self-pay

## 2013-06-29 ENCOUNTER — Ambulatory Visit: Payer: Medicare PPO | Admitting: Occupational Therapy

## 2013-06-29 DIAGNOSIS — Z5189 Encounter for other specified aftercare: Secondary | ICD-10-CM | POA: Insufficient documentation

## 2013-06-29 DIAGNOSIS — R4189 Other symptoms and signs involving cognitive functions and awareness: Secondary | ICD-10-CM | POA: Insufficient documentation

## 2013-06-29 DIAGNOSIS — I69959 Hemiplegia and hemiparesis following unspecified cerebrovascular disease affecting unspecified side: Secondary | ICD-10-CM | POA: Insufficient documentation

## 2013-06-29 DIAGNOSIS — M6281 Muscle weakness (generalized): Secondary | ICD-10-CM | POA: Insufficient documentation

## 2013-06-29 DIAGNOSIS — R5381 Other malaise: Secondary | ICD-10-CM | POA: Insufficient documentation

## 2013-06-29 DIAGNOSIS — I69922 Dysarthria following unspecified cerebrovascular disease: Secondary | ICD-10-CM | POA: Insufficient documentation

## 2013-06-29 DIAGNOSIS — R279 Unspecified lack of coordination: Secondary | ICD-10-CM | POA: Insufficient documentation

## 2013-06-29 DIAGNOSIS — R269 Unspecified abnormalities of gait and mobility: Secondary | ICD-10-CM | POA: Insufficient documentation

## 2013-06-29 NOTE — Telephone Encounter (Signed)
Patient called about elevated blood pressure.  Left message for patient to call office regarding this.

## 2013-07-04 ENCOUNTER — Ambulatory Visit: Payer: Medicare PPO | Admitting: Occupational Therapy

## 2013-07-04 ENCOUNTER — Ambulatory Visit: Payer: Medicare PPO | Admitting: Physical Therapy

## 2013-07-06 ENCOUNTER — Ambulatory Visit: Payer: Medicare PPO | Admitting: Occupational Therapy

## 2013-07-06 ENCOUNTER — Ambulatory Visit: Payer: Medicare PPO | Admitting: Physical Therapy

## 2013-07-10 ENCOUNTER — Encounter: Payer: Self-pay | Admitting: Physical Medicine & Rehabilitation

## 2013-07-10 ENCOUNTER — Encounter: Payer: Medicare PPO | Attending: Physical Medicine & Rehabilitation

## 2013-07-10 ENCOUNTER — Ambulatory Visit (HOSPITAL_BASED_OUTPATIENT_CLINIC_OR_DEPARTMENT_OTHER): Payer: Medicare PPO | Admitting: Physical Medicine & Rehabilitation

## 2013-07-10 VITALS — BP 151/85 | HR 75 | Resp 14 | Ht 70.0 in | Wt 240.6 lb

## 2013-07-10 DIAGNOSIS — R269 Unspecified abnormalities of gait and mobility: Secondary | ICD-10-CM | POA: Insufficient documentation

## 2013-07-10 DIAGNOSIS — I69993 Ataxia following unspecified cerebrovascular disease: Secondary | ICD-10-CM

## 2013-07-10 DIAGNOSIS — Z7902 Long term (current) use of antithrombotics/antiplatelets: Secondary | ICD-10-CM | POA: Insufficient documentation

## 2013-07-10 DIAGNOSIS — R29898 Other symptoms and signs involving the musculoskeletal system: Secondary | ICD-10-CM | POA: Insufficient documentation

## 2013-07-10 DIAGNOSIS — I6330 Cerebral infarction due to thrombosis of unspecified cerebral artery: Secondary | ICD-10-CM

## 2013-07-10 DIAGNOSIS — E119 Type 2 diabetes mellitus without complications: Secondary | ICD-10-CM | POA: Insufficient documentation

## 2013-07-10 DIAGNOSIS — I10 Essential (primary) hypertension: Secondary | ICD-10-CM | POA: Insufficient documentation

## 2013-07-10 DIAGNOSIS — I69998 Other sequelae following unspecified cerebrovascular disease: Secondary | ICD-10-CM | POA: Insufficient documentation

## 2013-07-10 NOTE — Progress Notes (Signed)
Subjective:    Patient ID: Thomas Nixon, male    DOB: 03/26/1971, 42 y.o.   MRN: [id number]  HPI This is a 42 year old right-handed male  with history of hypertension, diabetes mellitus, as well as CVA with  residual left-sided weakness, maintained on Aggrenox. The patient was  independent prior to admission, living with his wife, had a full workup  in the past for noted CVAs at outside hospitals, maintained on Aggrenox.  Admitted June 01, 2013, with left facial droop, which developed while on  a cruise in [place]. MRI was completed at outside hospital, not made  available, requested to come back to [facility] for ongoing workup where  MRI of the brain showed extensive chronic ischemic changes as well as  acute subacute infarct in the posterior limb internal capsule and deep  white matter in the right frontal parietal lobe. MRA of the head  negative for aneurysm. No significant intracranial stenosis.  Echocardiogram with ejection fraction of 55%. No wall motion  abnormalities. Carotid Dopplers with less than 39% ICA stenosis. The  patient did not receive tPA  CIR DATE OF ADMISSION: 06/06/2013  DATE OF DISCHARGE: 06/14/2013  Currently receiving outpatient PT OT and speech therapy approximately twice a week. No falls since returning home. Has seen primary care physician since returning home.  Patient is taking all his medicines as prescribed. Pain Inventory Average Pain 5 Pain Right Now 2 My pain is burning and stabbing  In the last 24 hours, has pain interfered with the following? General activity 4 Relation with others 7 Enjoyment of life 9 What TIME of day is your pain at its worst? evening Sleep (in general) Fair  Pain is worse with: some activites Pain improves with: therapy/exercise Relief from Meds: na  Mobility walk without assistance how many minutes can you walk? 6 ability to climb steps?  yes do you drive?  no Do you have any goals in this area?   no  Function not employed: date last employed 2010 disabled: date disabled 2010 Do you have any goals in this area?  no  Neuro/Psych weakness  Prior Studies Any changes since last visit?  no  Physicians involved in your care Any changes since last visit?  no   History reviewed. No pertinent family history. History   Social History  . Marital Status: Married    Spouse Name: N/A    Number of Children: N/A  . Years of Education: N/A   Social History Main Topics  . Smoking status: Never Smoker   . Smokeless tobacco: None  . Alcohol Use: Yes  . Drug Use: No  . Sexual Activity: None   Other Topics Concern  . None   Social History Narrative  . None   History reviewed. No pertinent past surgical history. Past Medical History  Diagnosis Date  . Stroke     5 strokes  . Hypertension   . Diabetes mellitus without complication    BP 151/85  Pulse 75  Resp 14  Ht 5\' 10"  (1.778 m)  Wt 240 lb 9.6 oz (109.135 kg)  BMI 34.52 kg/m2  SpO2 97%    Review of Systems  Constitutional: Positive for unexpected weight change.  Endocrine:       High blood sugar  Neurological: Positive for weakness.       Objective:   Physical Exam  Nursing note and vitals reviewed. Constitutional: He is oriented to person, place, and time. He appears well-developed and well-nourished.  HENT:  Head:  Normocephalic and atraumatic.  Eyes: Conjunctivae and EOM are normal. Pupils are equal, round, and reactive to light.  Neck: Normal range of motion.  Neurological: He is alert and oriented to person, place, and time. He displays no atrophy and no tremor. A sensory deficit is present. No cranial nerve deficit. He exhibits normal muscle tone. He displays a negative Romberg sign. Gait abnormal.  Unable to perform tandem gait  Psychiatric: He has a normal mood and affect.          Assessment & Plan:  1. Right posterior limb internal capsule infarct with mild left upper extremity weakness  as well as gait disturbance. Functionally independent. I feel he can return to driving in a gradual fashion.  Followup primary care physician on blood pressure  RTC when necessary Graduated return to driving instructions were provided. It is recommended that the patient first drives with another licensed driver in an empty parking lot. If the patient does well with this, and they can drive on a quiet street with the licensed driver. If the patient does well with this they can drive on a busy street with a licensed driver. If the patient does well with this, the next time out they can go by himself. For the first month after resuming driving, I recommend no nighttime or Interstate driving.

## 2013-07-10 NOTE — Patient Instructions (Addendum)

## 2013-07-11 ENCOUNTER — Ambulatory Visit: Payer: Medicare PPO | Admitting: Occupational Therapy

## 2013-07-11 ENCOUNTER — Ambulatory Visit: Payer: Medicare PPO | Admitting: Physical Therapy

## 2013-07-13 ENCOUNTER — Ambulatory Visit: Payer: Medicare PPO | Admitting: Physical Therapy

## 2013-07-13 ENCOUNTER — Ambulatory Visit: Payer: Medicare PPO | Admitting: Occupational Therapy

## 2013-07-18 ENCOUNTER — Ambulatory Visit: Payer: Medicare PPO | Admitting: Physical Therapy

## 2013-07-18 ENCOUNTER — Ambulatory Visit: Payer: Medicare PPO

## 2013-07-18 ENCOUNTER — Ambulatory Visit: Payer: Medicare PPO | Admitting: Occupational Therapy

## 2013-07-20 ENCOUNTER — Ambulatory Visit: Payer: Medicare PPO

## 2013-07-20 ENCOUNTER — Ambulatory Visit: Payer: Medicare PPO | Admitting: Occupational Therapy

## 2013-07-20 ENCOUNTER — Ambulatory Visit: Payer: Medicare PPO | Admitting: Physical Therapy

## 2013-07-25 ENCOUNTER — Ambulatory Visit: Payer: Medicare PPO | Admitting: Occupational Therapy

## 2013-07-25 ENCOUNTER — Ambulatory Visit: Payer: Medicare PPO

## 2013-07-25 ENCOUNTER — Ambulatory Visit: Payer: Medicare PPO | Attending: Physical Medicine & Rehabilitation | Admitting: Physical Therapy

## 2013-07-25 DIAGNOSIS — R279 Unspecified lack of coordination: Secondary | ICD-10-CM | POA: Insufficient documentation

## 2013-07-25 DIAGNOSIS — R5381 Other malaise: Secondary | ICD-10-CM | POA: Insufficient documentation

## 2013-07-25 DIAGNOSIS — Z5189 Encounter for other specified aftercare: Secondary | ICD-10-CM | POA: Insufficient documentation

## 2013-07-25 DIAGNOSIS — M6281 Muscle weakness (generalized): Secondary | ICD-10-CM | POA: Insufficient documentation

## 2013-07-25 DIAGNOSIS — R269 Unspecified abnormalities of gait and mobility: Secondary | ICD-10-CM | POA: Insufficient documentation

## 2013-07-25 DIAGNOSIS — R4189 Other symptoms and signs involving cognitive functions and awareness: Secondary | ICD-10-CM | POA: Insufficient documentation

## 2013-07-25 DIAGNOSIS — I69922 Dysarthria following unspecified cerebrovascular disease: Secondary | ICD-10-CM | POA: Insufficient documentation

## 2013-07-25 DIAGNOSIS — I69959 Hemiplegia and hemiparesis following unspecified cerebrovascular disease affecting unspecified side: Secondary | ICD-10-CM | POA: Insufficient documentation

## 2013-07-27 ENCOUNTER — Ambulatory Visit: Payer: Medicare PPO | Admitting: Occupational Therapy

## 2013-07-27 ENCOUNTER — Ambulatory Visit: Payer: Medicare PPO

## 2013-07-27 ENCOUNTER — Ambulatory Visit: Payer: Medicare PPO | Admitting: Physical Therapy

## 2013-08-01 ENCOUNTER — Ambulatory Visit: Payer: Medicare PPO | Admitting: *Deleted

## 2013-08-01 ENCOUNTER — Ambulatory Visit: Payer: Medicare PPO

## 2013-08-01 ENCOUNTER — Ambulatory Visit: Payer: Medicare PPO | Admitting: Physical Therapy

## 2013-08-03 ENCOUNTER — Ambulatory Visit: Payer: Medicare PPO | Admitting: Physical Therapy

## 2013-08-03 ENCOUNTER — Ambulatory Visit: Payer: Medicare PPO | Admitting: Occupational Therapy

## 2013-08-10 ENCOUNTER — Ambulatory Visit: Payer: Medicare PPO | Admitting: Occupational Therapy

## 2013-08-10 ENCOUNTER — Ambulatory Visit: Payer: Medicare PPO

## 2013-08-11 ENCOUNTER — Ambulatory Visit: Payer: Medicare PPO | Admitting: Occupational Therapy

## 2013-08-11 ENCOUNTER — Ambulatory Visit: Payer: Medicare PPO

## 2013-08-15 ENCOUNTER — Ambulatory Visit: Payer: Medicare PPO

## 2013-08-15 ENCOUNTER — Ambulatory Visit: Payer: Medicare PPO | Admitting: Occupational Therapy

## 2013-08-16 ENCOUNTER — Ambulatory Visit: Payer: Medicare PPO

## 2013-08-16 ENCOUNTER — Ambulatory Visit: Payer: Medicare PPO | Admitting: Occupational Therapy

## 2013-08-21 ENCOUNTER — Ambulatory Visit: Payer: Medicare PPO | Admitting: Speech Pathology

## 2013-08-24 ENCOUNTER — Ambulatory Visit: Payer: Medicare PPO | Attending: Physical Medicine & Rehabilitation

## 2013-08-24 DIAGNOSIS — R5381 Other malaise: Secondary | ICD-10-CM | POA: Insufficient documentation

## 2013-08-24 DIAGNOSIS — R269 Unspecified abnormalities of gait and mobility: Secondary | ICD-10-CM | POA: Insufficient documentation

## 2013-08-24 DIAGNOSIS — M6281 Muscle weakness (generalized): Secondary | ICD-10-CM | POA: Insufficient documentation

## 2013-08-24 DIAGNOSIS — I69922 Dysarthria following unspecified cerebrovascular disease: Secondary | ICD-10-CM | POA: Insufficient documentation

## 2013-08-24 DIAGNOSIS — Z5189 Encounter for other specified aftercare: Secondary | ICD-10-CM | POA: Insufficient documentation

## 2013-08-24 DIAGNOSIS — R4189 Other symptoms and signs involving cognitive functions and awareness: Secondary | ICD-10-CM | POA: Insufficient documentation

## 2013-08-24 DIAGNOSIS — R279 Unspecified lack of coordination: Secondary | ICD-10-CM | POA: Insufficient documentation

## 2013-08-24 DIAGNOSIS — I69959 Hemiplegia and hemiparesis following unspecified cerebrovascular disease affecting unspecified side: Secondary | ICD-10-CM | POA: Insufficient documentation

## 2013-08-28 ENCOUNTER — Ambulatory Visit: Payer: Medicare PPO

## 2013-08-29 ENCOUNTER — Ambulatory Visit: Payer: Medicare PPO | Admitting: Speech Pathology

## 2013-08-31 ENCOUNTER — Ambulatory Visit: Payer: Medicare PPO | Admitting: Physical Therapy

## 2013-09-01 ENCOUNTER — Ambulatory Visit: Payer: Medicare PPO

## 2013-09-05 ENCOUNTER — Ambulatory Visit: Payer: Medicare PPO | Admitting: Speech Pathology

## 2013-09-07 ENCOUNTER — Ambulatory Visit: Payer: Medicare PPO | Admitting: Speech Pathology

## 2013-09-12 ENCOUNTER — Ambulatory Visit: Payer: Medicare PPO | Admitting: Speech Pathology

## 2013-09-14 ENCOUNTER — Ambulatory Visit: Payer: Medicare PPO

## 2014-07-03 ENCOUNTER — Other Ambulatory Visit: Payer: Self-pay | Admitting: Pain Medicine

## 2014-07-03 DIAGNOSIS — M5137 Other intervertebral disc degeneration, lumbosacral region: Secondary | ICD-10-CM

## 2014-07-07 ENCOUNTER — Ambulatory Visit
Admission: RE | Admit: 2014-07-07 | Discharge: 2014-07-07 | Disposition: A | Discharge status: Home / Self Care | Payer: Medicare PPO | Source: Ambulatory Visit | Attending: Pain Medicine | Admitting: Pain Medicine

## 2014-07-07 DIAGNOSIS — M5137 Other intervertebral disc degeneration, lumbosacral region: Secondary | ICD-10-CM

## 2014-07-17 ENCOUNTER — Other Ambulatory Visit: Payer: Self-pay | Admitting: Orthopedic Surgery

## 2014-07-23 ENCOUNTER — Inpatient Hospital Stay (HOSPITAL_COMMUNITY): Admission: RE | Admit: 2014-07-23 | Payer: Self-pay | Source: Ambulatory Visit

## 2014-07-25 ENCOUNTER — Encounter: Payer: Self-pay | Admitting: Neurology

## 2014-07-25 ENCOUNTER — Telehealth: Payer: Self-pay | Admitting: *Deleted

## 2014-07-25 ENCOUNTER — Ambulatory Visit (INDEPENDENT_AMBULATORY_CARE_PROVIDER_SITE_OTHER): Payer: Medicare PPO | Admitting: Neurology

## 2014-07-25 VITALS — BP 150/100 | HR 81 | Ht 71.0 in | Wt 233.0 lb

## 2014-07-25 DIAGNOSIS — G4730 Sleep apnea, unspecified: Secondary | ICD-10-CM

## 2014-07-25 DIAGNOSIS — E785 Hyperlipidemia, unspecified: Secondary | ICD-10-CM

## 2014-07-25 DIAGNOSIS — Z79899 Other long term (current) drug therapy: Secondary | ICD-10-CM

## 2014-07-25 DIAGNOSIS — I639 Cerebral infarction, unspecified: Secondary | ICD-10-CM | POA: Insufficient documentation

## 2014-07-25 DIAGNOSIS — I6990 Unspecified sequelae of unspecified cerebrovascular disease: Secondary | ICD-10-CM

## 2014-07-25 DIAGNOSIS — E119 Type 2 diabetes mellitus without complications: Secondary | ICD-10-CM

## 2014-07-25 DIAGNOSIS — IMO0002 Reserved for concepts with insufficient information to code with codable children: Secondary | ICD-10-CM | POA: Insufficient documentation

## 2014-07-25 NOTE — Telephone Encounter (Signed)
Wife called and is requesting call back from Dr. Terrace Arabia re: pts appt today.   Pt not able to relay to her.  [phone] , may LM.

## 2014-07-25 NOTE — Telephone Encounter (Signed)
I have called his wife, he had brain biopsy in the past, she and patient was not sure the date and place of the brain biopsy.  I also explained to her, I have initiated for their evaluation, including MRI of brain, cervical spine, lumbar puncture, visual evoked potential, laboratory evaluation, for potential diagnosis of multiple sclerosis,  1, she will try to get brain biopsy result. 2.  RTC in one month. 3. May consider postphone Lumbar decompression surgery until we draw a clear conclusion on his diagnosis

## 2014-07-25 NOTE — Progress Notes (Signed)
PATIENT: Thomas Nixon DOB: 1971/04/09  HISTORICAL  Thomas ALVILLAR is a 43 years old right-handed African American male, alone at visit, poor historian, he is referred by his primary care physician Thomas Nixon to continue followup of his neurological condition  I actually saw him in 2011, but he has lost followup  He has past medical history of hypertension, diabetes, hyperlipidemia, with reported history of multiple strokelike event, at least 5 times, started since early twenties, last one was 2014, he was treated at different medical facility, including [facility], [facility], [facility].  Most recent MRI at [facility]: In 2014 has demonstrated extensive periventricular white matter disease, evidence of previous right parietal biopsy, hemosiderin deposit.  MRA of the brain was normal. ultrasound of carotid artery showed no significant large vessel disease.  Based on previous visit in 2011, we were able to review some records from [facility], dated August 2009, he was treated by neurologist Thomas Nixon then,  Labooratory evaluation,normaal fasting lipid profile,LDL was 53,  negative HIV, RPR,VDRL,  Athena Notch-3 Gene Mutation negative. norml CBC, INR,, CMP, other than mild elehim and Glu 128, A1C 6.4, TSH 1.5. TTE: normal.  There were also mention of brain biopsy with demyelinating features, he did have improvement with steroid treatment, biopsy was read at [facility] clinic.  Over the past few years, he complains of slow worsening gait difficulty, slurred speech, he denies visual trouble, he denies bowel and bladder incontinence.  Per patient, he is planning on to have lumbar decompression surgery by orthopedic surgeon Thomas Nixon because of chronic low back pain, radiating pain to  Right leg.   REVIEW OF SYSTEMS: Full 14 system review of systems performed and notable only for low back pain, gait difficulty  ALLERGIES: Allergies    Allergen Reactions  . Pork-Derived Products     RELIGIOUS RESTRICTIONS    HOME MEDICATIONS: Current Outpatient Prescriptions on File Prior to Visit  Medication Sig Dispense Refill  . amLODipine (NORVASC) 5 MG tablet Take 5 mg by mouth every morning.      Marland Kitchen atorvastatin (LIPITOR) 40 MG tablet Take 1 tablet (40 mg total) by mouth at bedtime.  30 tablet  1  . clopidogrel (PLAVIX) 75 MG tablet Take 1 tablet (75 mg total) by mouth daily with breakfast.  30 tablet  1  . lisinopril (PRINIVIL,ZESTRIL) 20 MG tablet Take 20 mg by mouth 2 (two) times daily.       . metFORMIN (GLUMETZA) 1000 MG (MOD) 24 hr tablet Take 1 tablet (1,000 mg total) by mouth 2 (two) times daily.  60 tablet  1  . pregabalin (LYRICA) 150 MG capsule Take 1 capsule (150 mg total) by mouth 2 (two) times daily.  60 capsule  1   No current facility-administered medications on file prior to visit.    PAST MEDICAL HISTORY: Past Medical History  Diagnosis Date  . Stroke     5 strokes  . Hypertension   . Diabetes mellitus without complication   . CVA (cerebral infarction)   . Neuropathy   . Thoracic or lumbosacral neuritis or radiculitis, unspecified   . Other and unspecified hyperlipidemia   . Encounter for long-term (current) use of other medications   . Unspecified late effects of cerebrovascular disease   . Unspecified sleep apnea     PAST SURGICAL HISTORY: Past Surgical History  Procedure Laterality Date  . Brain biopsy      FAMILY HISTORY:  Family History  Problem Relation Age of Onset  . High blood pressure Mother   . High blood pressure Father   . Diabetes Mother     SOCIAL HISTORY:  History   Social History  . Marital Status: Married    Spouse Name: Thomas Nixon    Number of Children: 0  . Years of Education: college   Occupational History    Disabled   Social History Main Topics  . Smoking status: Never Smoker   . Smokeless tobacco: Never Used  . Alcohol Use: 0.6 oz/week    1 Cans of beer  per week     Comment: Once per month.  . Drug Use: No  . Sexual Activity: Not on file   Other Topics Concern  . Not on file   Social History Narrative   Patient is married and lives at home with his wife Thomas Nixon .   Disabled.   Education [employer].   Right handed.   Caffeine none     PHYSICAL EXAM   Filed Vitals:   07/25/14 0844  BP: 150/100  Pulse: 81  Height:  (1.803 m)  Weight: 233 lb (105.688 kg)    Not recorded    Body mass index is 32.51 kg/(m^2).   Generalized: In no acute distress  Neck: Supple, no carotid bruits   Cardiac: Regular rate rhythm  Pulmonary: Clear to auscultation bilaterally  Musculoskeletal: No deformity  Neurological examination  Mentation: Slowed response, mild slurred speech, poor historian, Mini-Mental Status is 29 out of 30, he has difficulty copy design  Cranial nerve II-XII: Pupils were equal round reactive to light. Extraocular movements were full.  Visual field were full on confrontational test. Bilateral fundi were sharp.  Facial sensation and strength were normal. Hearing was intact to finger rubbing bilaterally. Uvula tongue midline.  Head turning and shoulder shrug and were normal and symmetric.Tongue protrusion into cheek strength was normal.Mild slow spastic tongue movement  Motor:Moderate bilateral lower extremity spasticity, mild bilateral hip flexion weakness   Sensory: Intact to fine touch, pinprick, preserved vibratory sensation, and proprioception at toes.  Coordination: Normal finger to nose, heel-to-shin bilaterally there was no truncal ataxia  Gait:  Need to pushup to get up from seated position, conscious, wide-based unsteady gait  Deep tendon reflexes: Brachioradialis 2/2, biceps 2/2, triceps 2/2, patellar 2/2, Achilles 2/2, plantar responses were flexor bilaterally.   DIAGNOSTIC DATA (LABS, IMAGING, TESTING) - I reviewed patient records, labs, notes, testing and imaging myself where available.  Lab  Results  Component Value Date   WBC 8.2 06/07/2013   HGB 13.1 06/07/2013   HCT 38.8* 06/07/2013   MCV 85.3 06/07/2013   PLT 260 06/07/2013      Component Value Date/Time   NA 139 06/12/2013 0524   K 3.8 06/12/2013 0524   CL 101 06/12/2013 0524   CO2 29 06/12/2013 0524   GLUCOSE 90 06/12/2013 0524   BUN 21 06/12/2013 0524   CREATININE 1.20 06/12/2013 0524   CALCIUM 9.0 06/12/2013 0524   PROT 6.9 06/07/2013 0615   ALBUMIN 3.4* 06/07/2013 0615   AST 21 06/07/2013 0615   ALT 31 06/07/2013 0615   ALKPHOS 36* 06/07/2013 0615   BILITOT 0.2* 06/07/2013 0615   GFRNONAA 75* 06/12/2013 0524   GFRAA 87* 06/12/2013 0524   Lab Results  Component Value Date   CHOL 142 06/02/2013   HDL 34* 06/02/2013   LDLCALC 92 06/02/2013   TRIG 81 06/02/2013   CHOLHDL 4.2 06/02/2013   Lab  Results  Component Value Date   HGBA1C 6.2* 06/01/2013   ASSESSMENT AND PLAN  Thomas Nixon is a 43 y.o. male with reported history of multiple focal neurological event, extensive periventricular white matter lesions on MRI of the brain, previous brain biopsy showed demyelinating features, ultrasound of carotid arteries showed no large vessel disease, MRA of the brain was normal   1. 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 He continued to have slow progressive worsening gait difficulty 3. Will complete evaluation to rule out MS mimics, 4. Return to clinic in one month  Levert Feinstein, M.D. Ph.D.  [facility] [address] [place], [place] [address] [phone]

## 2014-07-26 ENCOUNTER — Encounter (HOSPITAL_COMMUNITY): Admission: RE | Payer: Self-pay | Source: Ambulatory Visit

## 2014-07-26 ENCOUNTER — Inpatient Hospital Stay (HOSPITAL_COMMUNITY): Admission: RE | Admit: 2014-07-26 | Payer: Medicare PPO | Source: Ambulatory Visit | Admitting: Orthopedic Surgery

## 2014-07-26 SURGERY — ANTERIOR LATERAL LUMBAR FUSION 1 LEVEL
Anesthesia: General

## 2014-07-27 ENCOUNTER — Ambulatory Visit (INDEPENDENT_AMBULATORY_CARE_PROVIDER_SITE_OTHER): Payer: Medicare PPO

## 2014-07-27 DIAGNOSIS — E785 Hyperlipidemia, unspecified: Secondary | ICD-10-CM

## 2014-07-27 DIAGNOSIS — IMO0002 Reserved for concepts with insufficient information to code with codable children: Secondary | ICD-10-CM

## 2014-07-27 DIAGNOSIS — E119 Type 2 diabetes mellitus without complications: Secondary | ICD-10-CM

## 2014-07-27 DIAGNOSIS — I6990 Unspecified sequelae of unspecified cerebrovascular disease: Secondary | ICD-10-CM

## 2014-07-27 DIAGNOSIS — G4730 Sleep apnea, unspecified: Secondary | ICD-10-CM

## 2014-07-27 DIAGNOSIS — Z79899 Other long term (current) drug therapy: Secondary | ICD-10-CM

## 2014-07-27 NOTE — Procedures (Signed)
    History:   Thomas Nixon is a 42 year old gentleman with a history of extensive white matter lesions by MRI the brain. Brain biopsy has shown evidence of demyelinating features. The patient is being evaluated for possible multiple sclerosis.  Description: The visual evoked response test was performed today using 32 x 32 check sizes. The absolute latencies for the N1 and the P100 wave forms were within normal limits bilaterally. The amplitudes for the P100 wave forms were also within normal limits bilaterally. The visual acuity was 20/30 OD and 20/30 OS corrected.  Impression:  The visual evoked response test above was within normal limits bilaterally. No evidence of conduction slowing was seen within the anterior visual pathways on either side on today's evaluation.

## 2014-07-30 LAB — RPR: RPR: NONREACTIVE

## 2014-07-30 LAB — IFE AND PE, SERUM
Albumin SerPl Elph-Mcnc: 3.8 g/dL (ref 3.2–5.6)
Albumin/Glob SerPl: 1.1 (ref 0.7–2.0)
Alpha 1: 0.2 g/dL (ref 0.1–0.4)
Alpha2 Glob SerPl Elph-Mcnc: 0.9 g/dL (ref 0.4–1.2)
B-Globulin SerPl Elph-Mcnc: 1.2 g/dL (ref 0.6–1.3)
Gamma Glob SerPl Elph-Mcnc: 1.2 g/dL (ref 0.5–1.6)
Globulin, Total: 3.5 g/dL (ref 2.0–4.5)
IgA/Immunoglobulin A, Serum: 297 mg/dL (ref 91–414)
IgG (Immunoglobin G), Serum: 1056 mg/dL (ref 700–1600)
IgM (Immunoglobulin M), Srm: 44 mg/dL (ref 40–230)
Total Protein: 7.3 g/dL (ref 6.0–8.5)

## 2014-07-30 LAB — HIV ANTIBODY (ROUTINE TESTING W REFLEX)
HIV 1/O/2 Abs-Index Value: <1 (ref <1.00)
HIV-1/HIV-2 Ab: NONREACTIVE

## 2014-07-30 LAB — VITAMIN D 1,25 DIHYDROXY: Vit D, 1,25-Dihydroxy: 38.4 pg/mL (ref 19.9–79.3)

## 2014-07-30 LAB — THYROID PANEL WITH TSH
Free Thyroxine Index: 1.9 (ref 1.2–4.9)
T3 Uptake Ratio: 33 % (ref 24–39)
T4, Total: 5.9 ug/dL (ref 4.5–12.0)
TSH: 2.46 u[IU]/mL (ref 0.450–4.500)

## 2014-07-30 LAB — C-REACTIVE PROTEIN: CRP: 3.1 mg/L (ref 0.0–4.9)

## 2014-07-30 LAB — LYME, TOTAL AB TEST/REFLEX: Lyme IgG/IgM Ab: <0.91 {ISR} (ref 0.00–0.90)

## 2014-07-30 LAB — VITAMIN B12: Vitamin B-12: 432 pg/mL (ref 211–946)

## 2014-07-30 LAB — ANA W/REFLEX IF POSITIVE: Anti Nuclear Antibody(ANA): NEGATIVE

## 2014-07-30 LAB — CK: Total CK: 416 U/L — ABNORMAL HIGH (ref 24–204)

## 2014-07-30 LAB — SEDIMENTATION RATE: Sed Rate: 6 mm/hr (ref 0–15)

## 2014-08-01 ENCOUNTER — Telehealth: Payer: Self-pay | Admitting: Neurology

## 2014-08-01 NOTE — Telephone Encounter (Signed)
Patient called back and wants to know what his next step is I have had all testing done. I have a follow with Dr.Yan in In October. Patient states he had had Brain biopsy somewhere in [place] in 2001 but he does not remember where.

## 2014-08-01 NOTE — Telephone Encounter (Signed)
Thomas Nixon, make sure that he has his LP is done, please call his wife, follow up appt for patient and his wife after LP.

## 2014-08-01 NOTE — Telephone Encounter (Signed)
Patient would like a call back regarding questions about any further testing. Best number to call him back is [phone] and it is okay to leave a message.

## 2014-08-02 NOTE — Telephone Encounter (Signed)
Called and left message for Patient he still needs to have Lumbar Puncture done. Danielle from [facility] has left him a message to call her. Duwayne Heck [phone] I left information on cell and home phone. Patient's wife needs to be with him at follow up.

## 2014-08-03 NOTE — Telephone Encounter (Signed)
Patient called back and he has been scheduled for Lumber Puncture.  And his wife will be at follow appt.

## 2014-08-06 ENCOUNTER — Telehealth: Payer: Self-pay | Admitting: Neurology

## 2014-08-06 NOTE — Telephone Encounter (Signed)
 Imaging calling to get an order for Creatinine level before MRI on Wednesday.  They said patient can have lab drawn there.

## 2014-08-06 NOTE — Telephone Encounter (Signed)
Dr. Terrace Arabia patient did have his labs done that you ordered, results are in Epic.  But I don't see a CMP ordered by you.  There is a sign and held CMP order in Epic by another doctor.

## 2014-08-06 NOTE — Telephone Encounter (Signed)
Lupita Leash, please call patient, I have ordered CMP, and other labs in his last visit in Sep 2nd, he needs to come in to have lab draw today, it will result when he has MRI and follow up visit.

## 2014-08-07 ENCOUNTER — Other Ambulatory Visit: Payer: Self-pay | Admitting: Neurology

## 2014-08-07 DIAGNOSIS — I6990 Unspecified sequelae of unspecified cerebrovascular disease: Secondary | ICD-10-CM

## 2014-08-07 DIAGNOSIS — E119 Type 2 diabetes mellitus without complications: Secondary | ICD-10-CM

## 2014-08-07 DIAGNOSIS — G4730 Sleep apnea, unspecified: Secondary | ICD-10-CM

## 2014-08-07 DIAGNOSIS — Z79899 Other long term (current) drug therapy: Secondary | ICD-10-CM

## 2014-08-07 DIAGNOSIS — E785 Hyperlipidemia, unspecified: Secondary | ICD-10-CM

## 2014-08-07 NOTE — Telephone Encounter (Signed)
Lupita Leash, please make sure that he has creat level before MRI with contrast, if need [place] to put in order, I can do it.

## 2014-08-08 ENCOUNTER — Ambulatory Visit
Admission: RE | Admit: 2014-08-08 | Discharge: 2014-08-08 | Disposition: A | Discharge status: Home / Self Care | Payer: Medicare PPO | Source: Ambulatory Visit | Attending: Neurology | Admitting: Neurology

## 2014-08-08 DIAGNOSIS — E785 Hyperlipidemia, unspecified: Secondary | ICD-10-CM

## 2014-08-08 DIAGNOSIS — I6990 Unspecified sequelae of unspecified cerebrovascular disease: Secondary | ICD-10-CM

## 2014-08-08 DIAGNOSIS — E119 Type 2 diabetes mellitus without complications: Secondary | ICD-10-CM

## 2014-08-08 DIAGNOSIS — Z79899 Other long term (current) drug therapy: Secondary | ICD-10-CM

## 2014-08-08 DIAGNOSIS — G4730 Sleep apnea, unspecified: Secondary | ICD-10-CM

## 2014-08-08 DIAGNOSIS — IMO0002 Reserved for concepts with insufficient information to code with codable children: Secondary | ICD-10-CM

## 2014-08-08 MED ORDER — GADOBENATE DIMEGLUMINE 529 MG/ML IV SOLN
20.0000 mL | Freq: Once | INTRAVENOUS | Status: AC | PRN
  Administered 2014-08-08: 20 mL via INTRAVENOUS

## 2014-08-10 ENCOUNTER — Encounter: Payer: Self-pay | Admitting: Neurology

## 2014-08-10 ENCOUNTER — Ambulatory Visit (INDEPENDENT_AMBULATORY_CARE_PROVIDER_SITE_OTHER): Payer: Medicare PPO | Admitting: Neurology

## 2014-08-10 ENCOUNTER — Ambulatory Visit
Admission: RE | Admit: 2014-08-10 | Discharge: 2014-08-10 | Disposition: A | Discharge status: Home / Self Care | Payer: Medicare PPO | Source: Ambulatory Visit | Attending: Neurology | Admitting: Neurology

## 2014-08-10 DIAGNOSIS — G4730 Sleep apnea, unspecified: Secondary | ICD-10-CM

## 2014-08-10 DIAGNOSIS — E119 Type 2 diabetes mellitus without complications: Secondary | ICD-10-CM

## 2014-08-10 DIAGNOSIS — R471 Dysarthria and anarthria: Secondary | ICD-10-CM

## 2014-08-10 DIAGNOSIS — I6330 Cerebral infarction due to thrombosis of unspecified cerebral artery: Secondary | ICD-10-CM

## 2014-08-10 DIAGNOSIS — I6990 Unspecified sequelae of unspecified cerebrovascular disease: Secondary | ICD-10-CM

## 2014-08-10 DIAGNOSIS — IMO0002 Reserved for concepts with insufficient information to code with codable children: Secondary | ICD-10-CM

## 2014-08-10 DIAGNOSIS — I639 Cerebral infarction, unspecified: Secondary | ICD-10-CM

## 2014-08-10 DIAGNOSIS — E785 Hyperlipidemia, unspecified: Secondary | ICD-10-CM

## 2014-08-10 DIAGNOSIS — Z79899 Other long term (current) drug therapy: Secondary | ICD-10-CM

## 2014-08-10 DIAGNOSIS — G35 Multiple sclerosis: Secondary | ICD-10-CM | POA: Insufficient documentation

## 2014-08-10 NOTE — Telephone Encounter (Signed)
Spoke to [facility] and the patient did have his lab for BUN and creatinine prior to MRI Brain.

## 2014-08-10 NOTE — Progress Notes (Signed)
PATIENT: Thomas Nixon DOB: 08-Aug-1971  HISTORICAL  Thomas Nixon is a 43 years old right-handed African American male, alone at visit, poor historian, he is referred by his primary care physician Dr. Dr. Antony Blackbird to continue followup of his neurological condition  I actually saw him in 2011, but he has lost followup  He has past medical history of hypertension, diabetes, hyperlipidemia, with reported history of multiple strokelike event, at least 5 times, started since early twenties, last one was 2014, he was treated at different medical facility, including [facility], [facility], [facility].  Most recent MRI at [facility]: In 2014 has demonstrated extensive periventricular white matter disease, evidence of previous right parietal biopsy, hemosiderin deposit.  MRA of the brain was normal. ultrasound of carotid artery showed no significant large vessel disease.  Based on previous visit in 2011, we were able to review some records from [facility], dated August 2009, he was treated by neurologist Dr. Dallas Breeding blackwell then,  Laboratory evaluation,normaal fasting lipid profile,LDL was 53,  negative HIV, RPR,VDRL,  Athena Notch-3 Gene Mutation negative. norml CBC, INR,, CMP, other than mild elehim and Glu 128, A1C 6.4, TSH 1.5. TTE: normal.  There were also mention of brain biopsy with demyelinating features, he did have improvement with steroid treatment, biopsy was read at [facility] clinic.  Over the past few years, he complains of slow worsening gait difficulty, slurred speech, he denies visual trouble, he denies bowel and bladder incontinence.  Per patient, he is planning on to have lumbar decompression surgery by orthopedic surgeon Dr. Jonni Sanger because of chronic low back pain, radiating pain to  Right leg.  UPDATE Sep 18th 2015: Per records from [facility] orthopedic clinics, he has evidence of right lumbar radiculopathy, with MRI notable for  bilateral L3 pars defect, 10 mm spondylolisthesis at L3 and 4, increased on flexion radiograph, the supported by positive EMG findings, Dr. Lynann Bologna is planning on to have interbody fusion procedure.  I have reviewed records from [facility] in 2002, with a diagnosis of acute demyelinating lesions, affecting both left, and right side of the brain, with severe left visual field cut, left neglect  VEP was normal.   Lab showed normal or negative RPR, HIV, B12, folate acid, Lyme titer, ANA, TSH, protein electrophoresis, mild elevated CPK  REVIEW OF SYSTEMS: Full 14 system review of systems performed and notable only for low back pain, gait difficulty  ALLERGIES: Allergies  Allergen Reactions  . Pork-Derived Products     RELIGIOUS RESTRICTIONS    HOME MEDICATIONS: Current Outpatient Prescriptions on File Prior to Visit  Medication Sig Dispense Refill  . ACCU-CHEK FASTCLIX LANCETS MISC       . ACCU-CHEK SMARTVIEW test strip       . amLODipine (NORVASC) 5 MG tablet Take 5 mg by mouth every morning.      Marland Kitchen atorvastatin (LIPITOR) 40 MG tablet Take 1 tablet (40 mg total) by mouth at bedtime.  30 tablet  1  . Blood Glucose Monitoring Suppl (ACCU-CHEK NANO SMARTVIEW) W/DEVICE KIT       . clopidogrel (PLAVIX) 75 MG tablet Take 1 tablet (75 mg total) by mouth daily with breakfast.  30 tablet  1  . lisinopril (PRINIVIL,ZESTRIL) 20 MG tablet Take 20 mg by mouth 2 (two) times daily.       . metFORMIN (GLUMETZA) 1000 MG (MOD) 24 hr tablet Take 1 tablet (1,000 mg total) by mouth 2 (two) times daily.  60 tablet  1  . pregabalin (LYRICA) 150 MG capsule Take 1 capsule (150 mg total) by mouth 2 (two) times daily.  60 capsule  1  . traMADol (ULTRAM) 50 MG tablet 50 mg 2 (two) times daily as needed.       No current facility-administered medications on file prior to visit.    PAST MEDICAL HISTORY: Past Medical History  Diagnosis Date  . Stroke     5 strokes  . Hypertension   . Diabetes mellitus  without complication   . CVA (cerebral infarction)   . Neuropathy   . Thoracic or lumbosacral neuritis or radiculitis, unspecified   . Other and unspecified hyperlipidemia   . Encounter for long-term (current) use of other medications   . Unspecified late effects of cerebrovascular disease   . Unspecified sleep apnea     PAST SURGICAL HISTORY: Past Surgical History  Procedure Laterality Date  . Brain biopsy      FAMILY HISTORY: Family History  Problem Relation Age of Onset  . High blood pressure Mother   . High blood pressure Father   . Diabetes Mother     SOCIAL HISTORY:  History   Social History  . Marital Status: Married    Spouse Name: Caren Griffins    Number of Children: 0  . Years of Education: college   Occupational History    Disabled   Social History Main Topics  . Smoking status: Never Smoker   . Smokeless tobacco: Never Used  . Alcohol Use: 0.6 oz/week    1 Cans of beer per week     Comment: Once per month.  . Drug Use: No  . Sexual Activity: Not on file   Other Topics Concern  . Not on file   Social History Narrative   Patient is married and lives at home with his wife Caren Griffins .   Disabled.   Education [employer].   Right handed.   Caffeine none     PHYSICAL EXAM   There were no vitals filed for this visit.  Not recorded    There is no weight on file to calculate BMI.   Generalized: In no acute distress  Neck: Supple, no carotid bruits   Cardiac: Regular rate rhythm  Pulmonary: Clear to auscultation bilaterally  Musculoskeletal: No deformity  Neurological examination  Mentation: Slowed response, mild slurred speech, poor historian, Mini-Mental Status is 29 out of 30, he has difficulty copy design  Cranial nerve II-XII: Pupils were equal round reactive to light. Extraocular movements were full.  Visual field were full on confrontational test. Bilateral fundi were sharp.  Facial sensation and strength were normal. Hearing was intact to  finger rubbing bilaterally. Uvula tongue midline.  Head turning and shoulder shrug and were normal and symmetric.Tongue protrusion into cheek strength was normal.Mild slow spastic tongue movement  Motor:Moderate bilateral lower extremity spasticity, mild bilateral hip flexion weakness   Sensory: Intact to fine touch, pinprick, preserved vibratory sensation, and proprioception at toes.  Coordination: Normal finger to nose, heel-to-shin bilaterally there was no truncal ataxia  Gait:  Need to pushup to get up from seated position, conscious, wide-based unsteady gait  Deep tendon reflexes: Brachioradialis 2/2, biceps 2/2, triceps 2/2, patellar 2/2, Achilles 2/2, plantar responses were flexor bilaterally.   DIAGNOSTIC DATA (LABS, IMAGING, TESTING) - I reviewed patient records, labs, notes, testing and imaging myself where available.  Lab Results  Component Value Date   WBC 8.2 06/07/2013   HGB 13.1 06/07/2013   HCT  38.8* 06/07/2013   MCV 85.3 06/07/2013   PLT 260 06/07/2013      Component Value Date/Time   NA 139 06/12/2013 0524   K 3.8 06/12/2013 0524   CL 101 06/12/2013 0524   CO2 29 06/12/2013 0524   GLUCOSE 90 06/12/2013 0524   BUN 21 06/12/2013 0524   CREATININE 1.20 06/12/2013 0524   CALCIUM 9.0 06/12/2013 0524   PROT 7.3 07/25/2014 1006   PROT 6.9 06/07/2013 0615   ALBUMIN 3.4* 06/07/2013 0615   AST 21 06/07/2013 0615   ALT 31 06/07/2013 0615   ALKPHOS 36* 06/07/2013 0615   BILITOT 0.2* 06/07/2013 0615   GFRNONAA 75* 06/12/2013 0524   GFRAA 87* 06/12/2013 0524   Lab Results  Component Value Date   CHOL 142 06/02/2013   HDL 34* 06/02/2013   LDLCALC 92 06/02/2013   TRIG 81 06/02/2013   CHOLHDL 4.2 06/02/2013   Lab Results  Component Value Date   HGBA1C 6.2* 06/01/2013   ASSESSMENT AND PLAN  Thomas Nixon is a 43 y.o. male with reported history of multiple focal neurological event, extensive periventricular white matter lesions on MRI of the brain, previous brain biopsy showed  demyelinating features, ultrasound of carotid arteries showed no large vessel disease, MRA of the brain was normal   1. previous biopsy proven multiple sclerosis, has not had any immunomodulation therapy 2. I spent a lengthy time discuss with his wife, and patient's the diagnosis,  3. He is to proceed with lumbar decompression surgery 4.return to clinic in 2 months.   Marcial Pacas, M.D. Ph.D.  [facility] [address] [place], [place] [address] [phone]

## 2014-08-10 NOTE — Discharge Instructions (Signed)

## 2014-08-13 ENCOUNTER — Other Ambulatory Visit: Payer: Self-pay | Admitting: Orthopedic Surgery

## 2014-08-17 ENCOUNTER — Encounter (HOSPITAL_COMMUNITY): Payer: Self-pay

## 2014-08-22 ENCOUNTER — Encounter (HOSPITAL_COMMUNITY)
Admission: RE | Admit: 2014-08-22 | Discharge: 2014-08-22 | Disposition: A | Discharge status: Home / Self Care | Payer: Medicare PPO | Source: Ambulatory Visit | Attending: Orthopedic Surgery | Admitting: Orthopedic Surgery

## 2014-08-22 ENCOUNTER — Other Ambulatory Visit (HOSPITAL_COMMUNITY): Payer: Medicare PPO

## 2014-08-22 ENCOUNTER — Encounter (HOSPITAL_COMMUNITY): Payer: Self-pay

## 2014-08-22 ENCOUNTER — Ambulatory Visit (HOSPITAL_COMMUNITY)
Admission: RE | Admit: 2014-08-22 | Discharge: 2014-08-22 | Disposition: A | Discharge status: Home / Self Care | Payer: Medicare PPO | Source: Ambulatory Visit | Attending: Orthopedic Surgery | Admitting: Orthopedic Surgery

## 2014-08-22 ENCOUNTER — Other Ambulatory Visit (HOSPITAL_COMMUNITY): Payer: Self-pay | Admitting: *Deleted

## 2014-08-22 DIAGNOSIS — I10 Essential (primary) hypertension: Secondary | ICD-10-CM | POA: Insufficient documentation

## 2014-08-22 DIAGNOSIS — Z01818 Encounter for other preprocedural examination: Secondary | ICD-10-CM | POA: Insufficient documentation

## 2014-08-22 DIAGNOSIS — IMO0002 Reserved for concepts with insufficient information to code with codable children: Secondary | ICD-10-CM | POA: Insufficient documentation

## 2014-08-22 HISTORY — DX: Headache: R51

## 2014-08-22 LAB — CBC WITH DIFFERENTIAL/PLATELET
Basophils Absolute: 0 10*3/uL (ref 0.0–0.1)
Basophils Relative: 0 % (ref 0–1)
Eosinophils Absolute: 0.1 10*3/uL (ref 0.0–0.7)
Eosinophils Relative: 1 % (ref 0–5)
HCT: 43.4 % (ref 39.0–52.0)
Hemoglobin: 14.5 g/dL (ref 13.0–17.0)
Lymphocytes Relative: 32 % (ref 12–46)
Lymphs Abs: 1.7 10*3/uL (ref 0.7–4.0)
MCH: 28.4 pg (ref 26.0–34.0)
MCHC: 33.4 g/dL (ref 30.0–36.0)
MCV: 84.9 fL (ref 78.0–100.0)
Monocytes Absolute: 0.3 10*3/uL (ref 0.1–1.0)
Monocytes Relative: 5 % (ref 3–12)
Neutro Abs: 3.2 10*3/uL (ref 1.7–7.7)
Neutrophils Relative %: 62 % (ref 43–77)
Platelets: 214 10*3/uL (ref 150–400)
RBC: 5.11 MIL/uL (ref 4.22–5.81)
RDW: 13.7 % (ref 11.5–15.5)
WBC: 5.3 10*3/uL (ref 4.0–10.5)

## 2014-08-22 LAB — ABO/RH: ABO/RH(D): A POS

## 2014-08-22 LAB — COMPREHENSIVE METABOLIC PANEL
ALT: 14 U/L (ref 0–53)
AST: 20 U/L (ref 0–37)
Albumin: 3.8 g/dL (ref 3.5–5.2)
Alkaline Phosphatase: 37 U/L — ABNORMAL LOW (ref 39–117)
Anion gap: 11 (ref 5–15)
BUN: 20 mg/dL (ref 6–23)
CO2: 28 mEq/L (ref 19–32)
Calcium: 9.3 mg/dL (ref 8.4–10.5)
Chloride: 106 mEq/L (ref 96–112)
Creatinine, Ser: 1.15 mg/dL (ref 0.50–1.35)
GFR calc Af Amer: 90 mL/min — ABNORMAL LOW (ref >90)
GFR calc non Af Amer: 78 mL/min — ABNORMAL LOW (ref >90)
Glucose, Bld: 102 mg/dL — ABNORMAL HIGH (ref 70–99)
Potassium: 4.2 mEq/L (ref 3.7–5.3)
Sodium: 145 mEq/L (ref 137–147)
Total Bilirubin: 0.4 mg/dL (ref 0.3–1.2)
Total Protein: 7.5 g/dL (ref 6.0–8.3)

## 2014-08-22 LAB — URINALYSIS, ROUTINE W REFLEX MICROSCOPIC
Bilirubin Urine: NEGATIVE
Glucose, UA: NEGATIVE mg/dL
Hgb urine dipstick: NEGATIVE
Ketones, ur: NEGATIVE mg/dL
Leukocytes, UA: NEGATIVE
Nitrite: NEGATIVE
Protein, ur: NEGATIVE mg/dL
Specific Gravity, Urine: 1.028 (ref 1.005–1.030)
Urobilinogen, UA: 0.2 mg/dL (ref 0.0–1.0)
pH: 6 (ref 5.0–8.0)

## 2014-08-22 LAB — TYPE AND SCREEN
ABO/RH(D): A POS
Antibody Screen: NEGATIVE

## 2014-08-22 LAB — APTT: aPTT: 29 seconds (ref 24–37)

## 2014-08-22 LAB — SURGICAL PCR SCREEN
MRSA, PCR: NEGATIVE
Staphylococcus aureus: NEGATIVE

## 2014-08-22 LAB — PROTIME-INR
INR: 0.99 (ref 0.00–1.49)
Prothrombin Time: 13.1 seconds (ref 11.6–15.2)

## 2014-08-22 NOTE — Progress Notes (Signed)
Pt's PCP is Dr. Cain Saupe with [facility].   Neurological clearance by Dr. Levert Feinstein in chart. His note states that pt was diagnosed with MS in 2002, pt's wife states that he has not had a definite diagnosis, that Dr Vivia Ewing will be doing testing after the surgery.

## 2014-08-22 NOTE — Pre-Procedure Instructions (Signed)
Thomas Nixon  08/22/2014   Your procedure is scheduled on:  Thursday, August 30, 2014 at 7:30 AM.   Report to [facility] Entrance "A" Admitting Office at 5:30 AM.   Call this number if you have problems the morning of surgery: [phone]   Remember:   Do not eat food or drink liquids after midnight Wednesday, 08/29/14.   Take these medicines the morning of surgery with A SIP OF WATER: amLODipine (NORVASC), pregabalin (LYRICA) - if needed, traMADol (ULTRAM) - if needed.  Stop Plavix 7 days prior to surgery.   Do not wear jewelry.  Do not wear lotions, powders, or cologne. You may wear deodorant.  Men may shave face and neck.  Do not bring valuables to the hospital.  [facility] is not responsible                  for any belongings or valuables.               Contacts, dentures or bridgework may not be worn into surgery.  Leave suitcase in the car. After surgery it may be brought to your room.  For patients admitted to the hospital, discharge time is determined by your                treatment team.             Special Instructions: [name] - Preparing for Surgery  Before surgery, you can play an important role.  Because skin is not sterile, your skin needs to be as free of germs as possible.  You can reduce the number of germs on you skin by washing with CHG (chlorahexidine gluconate) soap before surgery.  CHG is an antiseptic cleaner which kills germs and bonds with the skin to continue killing germs even after washing.  Please DO NOT use if you have an allergy to CHG or antibacterial soaps.  If your skin becomes reddened/irritated stop using the CHG and inform your nurse when you arrive at Short Stay.  Do not shave (including legs and underarms) for at least 48 hours prior to the first CHG shower.  You may shave your face.  Please follow these instructions carefully:   1.  Shower with CHG Soap the night before surgery and the                                morning  of Surgery.  2.  If you choose to wash your hair, wash your hair first as usual with your       normal shampoo.  3.  After you shampoo, rinse your hair and body thoroughly to remove the                      Shampoo.  4.  Use CHG as you would any other liquid soap.  You can apply chg directly       to the skin and wash gently with scrungie or a clean washcloth.  5.  Apply the CHG Soap to your body ONLY FROM THE NECK DOWN.        Do not use on open wounds or open sores.  Avoid contact with your eyes, ears, mouth and genitals (private parts).  Wash genitals (private parts) with your normal soap.  6.  Wash thoroughly, paying special attention to the area where your surgery  will be performed.  7.  Thoroughly rinse your body with warm water from the neck down.  8.  DO NOT shower/wash with your normal soap after using and rinsing off       the CHG Soap.  9.  Pat yourself dry with a clean towel.            10.  Wear clean pajamas.            11.  Place clean sheets on your bed the night of your first shower and do not        sleep with pets.  Day of Surgery  Do not apply any lotions the morning of surgery.  Please wear clean clothes to the hospital/surgery center.     Please read over the following fact sheets that you were given: Pain Booklet, Coughing and Deep Breathing, Blood Transfusion Information, MRSA Information and Surgical Site Infection Prevention

## 2014-08-24 ENCOUNTER — Ambulatory Visit: Payer: Self-pay | Admitting: Neurology

## 2014-08-29 MED ORDER — CEFAZOLIN SODIUM-DEXTROSE 2-3 GM-% IV SOLR
2.0000 g | INTRAVENOUS | Status: AC
  Administered 2014-08-30 (×2): 2 g via INTRAVENOUS
  Filled 2014-08-29: qty 50

## 2014-08-29 NOTE — H&P (Signed)
PREOPERATIVE H&P  Chief Complaint: Right leg pain  HPI: Thomas Nixon is a 43 y.o. male who presents with ongoing pain in the right leg x 5 years  MRI reveals instability at L3/4. EMG + for L4 and L5 radic  Patient has failed multiple forms of conservative care and continues to have pain (see office notes for additional details regarding the patient's full course of treatment)  Past Medical History  Diagnosis Date  . Stroke     5 strokes  . Hypertension   . CVA (cerebral infarction)   . Neuropathy   . Thoracic or lumbosacral neuritis or radiculitis, unspecified   . Other and unspecified hyperlipidemia   . Encounter for long-term (current) use of other medications   . Unspecified late effects of cerebrovascular disease   . Unspecified sleep apnea     does not use cpap  . Diabetes mellitus without complication     Type 2  . JSEGBTDV(761.6)    Past Surgical History  Procedure Laterality Date  . Brain biopsy     History   Social History  . Marital Status: Married    Spouse Name: Thomas Nixon    Number of Children: 0  . Years of Education: college   Occupational History  .      Disabled   Social History Main Topics  . Smoking status: Never Smoker   . Smokeless tobacco: Never Used  . Alcohol Use: 0.6 oz/week    1 Cans of beer per week     Comment: Once per month.  . Drug Use: No  . Sexual Activity: Not on file   Other Topics Concern  . Not on file   Social History Narrative   Patient is married and lives at home with his wife Thomas Nixon .   Disabled.   Education [employer].   Right handed.   Caffeine none   Family History  Problem Relation Age of Onset  . High blood pressure Mother   . High blood pressure Father   . Diabetes Mother    Allergies  Allergen Reactions  . Pork-Derived Products     RELIGIOUS RESTRICTIONS   Prior to Admission medications   Medication Sig Start Date End Date Taking? Authorizing Provider  amLODipine (NORVASC) 5 MG tablet Take  5 mg by mouth every morning. 07/04/13  Yes Historical Provider, MD  atorvastatin (LIPITOR) 40 MG tablet Take 1 tablet (40 mg total) by mouth at bedtime. 06/14/13  Yes Daniel J Angiulli, PA-C  clopidogrel (PLAVIX) 75 MG tablet Take 1 tablet (75 mg total) by mouth daily with breakfast. 06/14/13  Yes Daniel J Angiulli, PA-C  lisinopril (PRINIVIL,ZESTRIL) 20 MG tablet Take 20 mg by mouth 2 (two) times daily.  07/04/13  Yes Historical Provider, MD  metFORMIN (GLUCOPHAGE) 500 MG tablet Take 500 mg by mouth 2 (two) times daily with a meal.   Yes Historical Provider, MD  pregabalin (LYRICA) 150 MG capsule Take 150 mg by mouth 2 (two) times daily as needed (pain).   Yes Historical Provider, MD  traMADol (ULTRAM) 50 MG tablet Take 50 mg by mouth 2 (two) times daily as needed for moderate pain.  04/28/14  Yes Historical Provider, MD     All other systems have been reviewed and were otherwise negative with the exception of those mentioned in the HPI and as above.  Physical Exam: There were no vitals filed for this visit.  General: Alert, no acute distress Cardiovascular: No pedal edema Respiratory: No  cyanosis, no use of accessory musculature Skin: No lesions in the area of chief complaint Neurologic: Sensation intact distally Psychiatric: Patient is competent for consent with normal mood and affect Lymphatic: No axillary or cervical lymphadenopathy   Assessment/Plan: Right leg pain Plan for Procedure(s): ANTERIOR LATERAL LUMBAR FUSION 1 LEVEL POSTERIOR LUMBAR FUSION 1 LEVEL   Thomas HeroUMONSKI,Lyrick Worland LEONARD, MD 08/29/2014 8:04 AM

## 2014-08-30 ENCOUNTER — Inpatient Hospital Stay (HOSPITAL_COMMUNITY): Payer: Medicare PPO | Admitting: Anesthesiology

## 2014-08-30 ENCOUNTER — Encounter (HOSPITAL_COMMUNITY): Payer: Self-pay | Admitting: *Deleted

## 2014-08-30 ENCOUNTER — Inpatient Hospital Stay (HOSPITAL_COMMUNITY): Payer: Medicare PPO

## 2014-08-30 ENCOUNTER — Encounter (HOSPITAL_COMMUNITY)
Admission: RE | Disposition: A | Discharge status: Home / Self Care | Payer: Medicare PPO | Source: Ambulatory Visit | Attending: Orthopedic Surgery

## 2014-08-30 ENCOUNTER — Inpatient Hospital Stay (HOSPITAL_COMMUNITY)
Admission: RE | Admit: 2014-08-30 | Discharge: 2014-09-01 | DRG: 460 | Disposition: A | Discharge status: Home / Self Care | Payer: Medicare PPO | Source: Ambulatory Visit | Attending: Orthopedic Surgery | Admitting: Orthopedic Surgery

## 2014-08-30 ENCOUNTER — Encounter (HOSPITAL_COMMUNITY): Payer: Medicare PPO | Admitting: Anesthesiology

## 2014-08-30 DIAGNOSIS — Z79899 Other long term (current) drug therapy: Secondary | ICD-10-CM | POA: Diagnosis not present

## 2014-08-30 DIAGNOSIS — Z833 Family history of diabetes mellitus: Secondary | ICD-10-CM | POA: Diagnosis not present

## 2014-08-30 DIAGNOSIS — M4316 Spondylolisthesis, lumbar region: Principal | ICD-10-CM | POA: Diagnosis present

## 2014-08-30 DIAGNOSIS — I10 Essential (primary) hypertension: Secondary | ICD-10-CM | POA: Diagnosis present

## 2014-08-30 DIAGNOSIS — M4326 Fusion of spine, lumbar region: Secondary | ICD-10-CM

## 2014-08-30 DIAGNOSIS — E119 Type 2 diabetes mellitus without complications: Secondary | ICD-10-CM | POA: Diagnosis present

## 2014-08-30 DIAGNOSIS — Z8673 Personal history of transient ischemic attack (TIA), and cerebral infarction without residual deficits: Secondary | ICD-10-CM | POA: Diagnosis not present

## 2014-08-30 DIAGNOSIS — E785 Hyperlipidemia, unspecified: Secondary | ICD-10-CM | POA: Diagnosis present

## 2014-08-30 DIAGNOSIS — M5410 Radiculopathy, site unspecified: Secondary | ICD-10-CM | POA: Diagnosis present

## 2014-08-30 DIAGNOSIS — M79604 Pain in right leg: Secondary | ICD-10-CM | POA: Diagnosis present

## 2014-08-30 HISTORY — PX: ANTERIOR LAT LUMBAR FUSION: SHX1168

## 2014-08-30 LAB — GLUCOSE, CAPILLARY
Glucose-Capillary: 94 mg/dL (ref 70–99)
Glucose-Capillary: 135 mg/dL — ABNORMAL HIGH (ref 70–99)
Glucose-Capillary: 148 mg/dL — ABNORMAL HIGH (ref 70–99)
Glucose-Capillary: 174 mg/dL — ABNORMAL HIGH (ref 70–99)

## 2014-08-30 SURGERY — ANTERIOR LATERAL LUMBAR FUSION 1 LEVEL
Anesthesia: General

## 2014-08-30 MED ORDER — DOCUSATE SODIUM 100 MG PO CAPS
100.0000 mg | ORAL_CAPSULE | Freq: Two times a day (BID) | ORAL | Status: DC
  Administered 2014-08-30 – 2014-09-01 (×4): 100 mg via ORAL
  Filled 2014-08-30 (×5): qty 1

## 2014-08-30 MED ORDER — BISACODYL 5 MG PO TBEC
5.0000 mg | DELAYED_RELEASE_TABLET | Freq: Every day | ORAL | Status: DC | PRN
Start: 2014-08-30 — End: 2014-09-01

## 2014-08-30 MED ORDER — ARTIFICIAL TEARS OP OINT
TOPICAL_OINTMENT | OPHTHALMIC | Status: AC
Start: 2014-08-30 — End: 2014-08-30
  Filled 2014-08-30: qty 3.5

## 2014-08-30 MED ORDER — ALUM & MAG HYDROXIDE-SIMETH 200-200-20 MG/5ML PO SUSP: 30.0000 mL | Freq: Four times a day (QID) | ORAL | Status: DC | PRN

## 2014-08-30 MED ORDER — SODIUM CHLORIDE 0.9 % IJ SOLN: 9.0000 mL | INTRAMUSCULAR | Status: DC | PRN

## 2014-08-30 MED ORDER — MIDAZOLAM HCL 2 MG/2ML IJ SOLN
INTRAMUSCULAR | Status: AC
  Filled 2014-08-30: qty 2

## 2014-08-30 MED ORDER — HYDROMORPHONE HCL 1 MG/ML IJ SOLN
INTRAMUSCULAR | Status: AC
  Administered 2014-08-30: 0.5 mg via INTRAVENOUS
  Filled 2014-08-30: qty 2

## 2014-08-30 MED ORDER — BUPIVACAINE-EPINEPHRINE (PF) 0.25% -1:200000 IJ SOLN
INTRAMUSCULAR | Status: AC
  Filled 2014-08-30: qty 30

## 2014-08-30 MED ORDER — DEXTROSE 5 % IV SOLN
INTRAVENOUS | Status: DC | PRN
  Administered 2014-08-30: 08:00:00 via INTRAVENOUS

## 2014-08-30 MED ORDER — BUPIVACAINE-EPINEPHRINE 0.25% -1:200000 IJ SOLN
INTRAMUSCULAR | Status: DC | PRN
  Administered 2014-08-30: 20 mL

## 2014-08-30 MED ORDER — OXYCODONE HCL 5 MG PO TABS: 5.0000 mg | ORAL_TABLET | Freq: Once | ORAL | Status: DC | PRN

## 2014-08-30 MED ORDER — DEXAMETHASONE SODIUM PHOSPHATE 4 MG/ML IJ SOLN
INTRAMUSCULAR | Status: AC
  Filled 2014-08-30: qty 3

## 2014-08-30 MED ORDER — MENTHOL 3 MG MT LOZG: 1.0000 | LOZENGE | OROMUCOSAL | Status: DC | PRN

## 2014-08-30 MED ORDER — 0.9 % SODIUM CHLORIDE (POUR BTL) OPTIME
TOPICAL | Status: DC | PRN
  Administered 2014-08-30: 1000 mL

## 2014-08-30 MED ORDER — MORPHINE SULFATE 2 MG/ML IJ SOLN: 1.0000 mg | INTRAMUSCULAR | Status: DC | PRN

## 2014-08-30 MED ORDER — ACETAMINOPHEN 650 MG RE SUPP: 650.0000 mg | RECTAL | Status: DC | PRN

## 2014-08-30 MED ORDER — STERILE WATER FOR INJECTION IJ SOLN
INTRAMUSCULAR | Status: AC
  Filled 2014-08-30: qty 10

## 2014-08-30 MED ORDER — MIDAZOLAM HCL 5 MG/5ML IJ SOLN
INTRAMUSCULAR | Status: DC | PRN
  Administered 2014-08-30: 2 mg via INTRAVENOUS

## 2014-08-30 MED ORDER — ONDANSETRON HCL 4 MG/2ML IJ SOLN: 4.0000 mg | Freq: Four times a day (QID) | INTRAMUSCULAR | Status: DC | PRN

## 2014-08-30 MED ORDER — MORPHINE SULFATE (PF) 1 MG/ML IV SOLN
INTRAVENOUS | Status: AC
  Filled 2014-08-30: qty 25

## 2014-08-30 MED ORDER — CEFAZOLIN SODIUM 1-5 GM-% IV SOLN
1.0000 g | Freq: Three times a day (TID) | INTRAVENOUS | Status: AC
  Administered 2014-08-30 – 2014-08-31 (×2): 1 g via INTRAVENOUS
  Filled 2014-08-30 (×2): qty 50

## 2014-08-30 MED ORDER — OXYCODONE-ACETAMINOPHEN 5-325 MG PO TABS
1.0000 | ORAL_TABLET | ORAL | Status: DC | PRN
  Administered 2014-08-31 – 2014-09-01 (×7): 2 via ORAL
  Filled 2014-08-30 (×7): qty 2

## 2014-08-30 MED ORDER — DIAZEPAM 5 MG PO TABS
ORAL_TABLET | ORAL | Status: AC
  Administered 2014-08-30: 5 mg via ORAL
  Filled 2014-08-30: qty 1

## 2014-08-30 MED ORDER — CEFAZOLIN SODIUM-DEXTROSE 2-3 GM-% IV SOLR
INTRAVENOUS | Status: AC
  Filled 2014-08-30: qty 50

## 2014-08-30 MED ORDER — FENTANYL CITRATE 0.05 MG/ML IJ SOLN
INTRAMUSCULAR | Status: AC
  Filled 2014-08-30: qty 5

## 2014-08-30 MED ORDER — AMLODIPINE BESYLATE 5 MG PO TABS
5.0000 mg | ORAL_TABLET | Freq: Every morning | ORAL | Status: DC
  Administered 2014-08-31 – 2014-09-01 (×2): 5 mg via ORAL
  Filled 2014-08-30 (×2): qty 1

## 2014-08-30 MED ORDER — MORPHINE SULFATE (PF) 1 MG/ML IV SOLN
INTRAVENOUS | Status: DC
  Administered 2014-08-30: 1.5 mg via INTRAVENOUS
  Administered 2014-08-30: 14:00:00 via INTRAVENOUS
  Administered 2014-08-31 (×2): 1.5 mg via INTRAVENOUS

## 2014-08-30 MED ORDER — ONDANSETRON HCL 4 MG/2ML IJ SOLN
INTRAMUSCULAR | Status: AC
  Filled 2014-08-30: qty 2

## 2014-08-30 MED ORDER — POVIDONE-IODINE 7.5 % EX SOLN
Freq: Once | CUTANEOUS | Status: DC
  Filled 2014-08-30: qty 118

## 2014-08-30 MED ORDER — SODIUM CHLORIDE 0.9 % IV SOLN
10.0000 mg | INTRAVENOUS | Status: DC | PRN
  Administered 2014-08-30: 20 ug/min via INTRAVENOUS

## 2014-08-30 MED ORDER — METFORMIN HCL 500 MG PO TABS
500.0000 mg | ORAL_TABLET | Freq: Two times a day (BID) | ORAL | Status: DC
  Administered 2014-08-30 – 2014-09-01 (×4): 500 mg via ORAL
  Filled 2014-08-30 (×6): qty 1

## 2014-08-30 MED ORDER — ONDANSETRON HCL 4 MG/2ML IJ SOLN
INTRAMUSCULAR | Status: DC | PRN
  Administered 2014-08-30 (×2): 4 mg via INTRAVENOUS

## 2014-08-30 MED ORDER — NALOXONE HCL 0.4 MG/ML IJ SOLN
0.4000 mg | INTRAMUSCULAR | Status: DC | PRN
Start: 2014-08-30 — End: 2014-08-31

## 2014-08-30 MED ORDER — PREGABALIN 75 MG PO CAPS
150.0000 mg | ORAL_CAPSULE | Freq: Two times a day (BID) | ORAL | Status: DC
  Administered 2014-08-30 – 2014-09-01 (×4): 150 mg via ORAL
  Filled 2014-08-30 (×4): qty 2

## 2014-08-30 MED ORDER — ARTIFICIAL TEARS OP OINT
TOPICAL_OINTMENT | OPHTHALMIC | Status: DC | PRN
  Administered 2014-08-30: 1 via OPHTHALMIC

## 2014-08-30 MED ORDER — DIAZEPAM 5 MG PO TABS
5.0000 mg | ORAL_TABLET | Freq: Four times a day (QID) | ORAL | Status: DC | PRN
Start: 2014-08-30 — End: 2014-09-01
  Administered 2014-08-30 – 2014-09-01 (×2): 5 mg via ORAL
  Filled 2014-08-30: qty 1

## 2014-08-30 MED ORDER — DEXAMETHASONE SODIUM PHOSPHATE 4 MG/ML IJ SOLN
INTRAMUSCULAR | Status: DC | PRN
  Administered 2014-08-30: 8 mg via INTRAVENOUS

## 2014-08-30 MED ORDER — HYDROMORPHONE HCL 1 MG/ML IJ SOLN
0.2500 mg | INTRAMUSCULAR | Status: DC | PRN
  Administered 2014-08-30 (×2): 0.5 mg via INTRAVENOUS

## 2014-08-30 MED ORDER — SUCCINYLCHOLINE CHLORIDE 20 MG/ML IJ SOLN
INTRAMUSCULAR | Status: DC | PRN
  Administered 2014-08-30: 120 mg via INTRAVENOUS

## 2014-08-30 MED ORDER — DIPHENHYDRAMINE HCL 50 MG/ML IJ SOLN
12.5000 mg | Freq: Four times a day (QID) | INTRAMUSCULAR | Status: DC | PRN
Start: 2014-08-30 — End: 2014-08-31

## 2014-08-30 MED ORDER — SODIUM CHLORIDE 0.9 % IV SOLN: 250.0000 mL | INTRAVENOUS | Status: DC

## 2014-08-30 MED ORDER — FLEET ENEMA 7-19 GM/118ML RE ENEM: 1.0000 | ENEMA | Freq: Once | RECTAL | Status: AC | PRN

## 2014-08-30 MED ORDER — ONDANSETRON HCL 4 MG/2ML IJ SOLN
INTRAMUSCULAR | Status: AC
  Filled 2014-08-30: qty 4

## 2014-08-30 MED ORDER — SODIUM CHLORIDE 0.9 % IV SOLN
INTRAVENOUS | Status: DC
  Administered 2014-08-30 (×2): via INTRAVENOUS

## 2014-08-30 MED ORDER — THROMBIN 20000 UNITS EX SOLR
CUTANEOUS | Status: AC
  Filled 2014-08-30: qty 40000

## 2014-08-30 MED ORDER — DIPHENHYDRAMINE HCL 12.5 MG/5ML PO ELIX: 12.5000 mg | ORAL_SOLUTION | Freq: Four times a day (QID) | ORAL | Status: DC | PRN

## 2014-08-30 MED ORDER — DEXAMETHASONE SODIUM PHOSPHATE 4 MG/ML IJ SOLN
INTRAMUSCULAR | Status: AC
  Filled 2014-08-30: qty 2

## 2014-08-30 MED ORDER — FENTANYL CITRATE 0.05 MG/ML IJ SOLN
INTRAMUSCULAR | Status: DC | PRN
  Administered 2014-08-30 (×3): 50 ug via INTRAVENOUS
  Administered 2014-08-30: 100 ug via INTRAVENOUS
  Administered 2014-08-30 (×3): 50 ug via INTRAVENOUS
  Administered 2014-08-30: 100 ug via INTRAVENOUS

## 2014-08-30 MED ORDER — OXYCODONE HCL 5 MG/5ML PO SOLN: 5.0000 mg | Freq: Once | ORAL | Status: DC | PRN

## 2014-08-30 MED ORDER — EPHEDRINE SULFATE 50 MG/ML IJ SOLN
INTRAMUSCULAR | Status: DC | PRN
  Administered 2014-08-30: 10 mg via INTRAVENOUS

## 2014-08-30 MED ORDER — PROPOFOL INFUSION 10 MG/ML OPTIME
INTRAVENOUS | Status: DC | PRN
  Administered 2014-08-30: 100 ug/kg/min via INTRAVENOUS

## 2014-08-30 MED ORDER — LIDOCAINE HCL (CARDIAC) 20 MG/ML IV SOLN
INTRAVENOUS | Status: DC | PRN
  Administered 2014-08-30: 50 mg via INTRAVENOUS
  Administered 2014-08-30: 70 mg via INTRAVENOUS

## 2014-08-30 MED ORDER — VECURONIUM BROMIDE 10 MG IV SOLR
INTRAVENOUS | Status: AC
  Filled 2014-08-30: qty 10

## 2014-08-30 MED ORDER — ACETAMINOPHEN 325 MG PO TABS: 650.0000 mg | ORAL_TABLET | ORAL | Status: DC | PRN

## 2014-08-30 MED ORDER — PROPOFOL 10 MG/ML IV BOLUS
INTRAVENOUS | Status: DC | PRN
  Administered 2014-08-30: 200 mg via INTRAVENOUS
  Administered 2014-08-30 (×2): 100 mg via INTRAVENOUS

## 2014-08-30 MED ORDER — ATORVASTATIN CALCIUM 40 MG PO TABS
40.0000 mg | ORAL_TABLET | Freq: Every day | ORAL | Status: DC
  Administered 2014-08-30 – 2014-08-31 (×2): 40 mg via ORAL
  Filled 2014-08-30 (×3): qty 1

## 2014-08-30 MED ORDER — ONDANSETRON HCL 4 MG/2ML IJ SOLN: 4.0000 mg | INTRAMUSCULAR | Status: DC | PRN

## 2014-08-30 MED ORDER — SODIUM CHLORIDE 0.9 % IJ SOLN: 3.0000 mL | INTRAMUSCULAR | Status: DC | PRN

## 2014-08-30 MED ORDER — LISINOPRIL 20 MG PO TABS
20.0000 mg | ORAL_TABLET | Freq: Two times a day (BID) | ORAL | Status: DC
  Administered 2014-08-30 – 2014-09-01 (×4): 20 mg via ORAL
  Filled 2014-08-30 (×5): qty 1

## 2014-08-30 MED ORDER — LIDOCAINE HCL (CARDIAC) 20 MG/ML IV SOLN
INTRAVENOUS | Status: AC
  Filled 2014-08-30: qty 10

## 2014-08-30 MED ORDER — LACTATED RINGERS IV SOLN
INTRAVENOUS | Status: DC | PRN
  Administered 2014-08-30 (×3): via INTRAVENOUS

## 2014-08-30 MED ORDER — SURGIFOAM 100 EX MISC
CUTANEOUS | Status: DC | PRN
  Administered 2014-08-30: 09:00:00 via TOPICAL

## 2014-08-30 MED ORDER — SENNOSIDES-DOCUSATE SODIUM 8.6-50 MG PO TABS: 1.0000 | ORAL_TABLET | Freq: Every evening | ORAL | Status: DC | PRN

## 2014-08-30 MED ORDER — PHENOL 1.4 % MT LIQD: 1.0000 | OROMUCOSAL | Status: DC | PRN

## 2014-08-30 MED ORDER — SODIUM CHLORIDE 0.9 % IJ SOLN
3.0000 mL | Freq: Two times a day (BID) | INTRAMUSCULAR | Status: DC
  Administered 2014-08-31: 3 mL via INTRAVENOUS

## 2014-08-30 MED FILL — Sodium Chloride IV Soln 0.9%: INTRAVENOUS | Qty: 1000 | Status: AC

## 2014-08-30 MED FILL — Heparin Sodium (Porcine) Inj 1000 Unit/ML: INTRAMUSCULAR | Qty: 30 | Status: AC

## 2014-08-30 SURGICAL SUPPLY — 98 items
BENZOIN TINCTURE PRP APPL 2/3 (GAUZE/BANDAGES/DRESSINGS) ×4 IMPLANT
BLADE SURG 10 STRL SS (BLADE) ×2 IMPLANT
BLADE SURG ROTATE 9660 (MISCELLANEOUS) IMPLANT
BOLT SPNL LRG 45X5.5XPLAT NS (Screw) ×1 IMPLANT
BUR ROUND PRECISION 4.0 (BURR) ×2 IMPLANT
CARTRIDGE OIL MAESTRO DRILL (MISCELLANEOUS) ×1 IMPLANT
CONT SPEC STER OR (MISCELLANEOUS) ×2 IMPLANT
CORDS BIPOLAR (ELECTRODE) ×2 IMPLANT
COROENT PLUS 8X18X45 (Cage) ×2 IMPLANT
COVER MAYO STAND STRL (DRAPES) ×4 IMPLANT
COVER SURGICAL LIGHT HANDLE (MISCELLANEOUS) ×2 IMPLANT
DIFFUSER DRILL AIR PNEUMATIC (MISCELLANEOUS) ×2 IMPLANT
DRAIN CHANNEL 15F RND FF W/TCR (WOUND CARE) IMPLANT
DRAPE C-ARM 42X72 X-RAY (DRAPES) ×2 IMPLANT
DRAPE C-ARMOR (DRAPES) ×2 IMPLANT
DRAPE ORTHO SPLIT 77X108 STRL (DRAPES) ×1
DRAPE POUCH INSTRU U-SHP 10X18 (DRAPES) ×2 IMPLANT
DRAPE SURG 17X23 STRL (DRAPES) ×10 IMPLANT
DRAPE SURG ORHT 6 SPLT 77X108 (DRAPES) ×1 IMPLANT
DRAPE U-SHAPE 47X51 STRL (DRAPES) IMPLANT
DRSG MEPILEX BORDER 4X8 (GAUZE/BANDAGES/DRESSINGS) IMPLANT
DURAPREP 26ML APPLICATOR (WOUND CARE) ×4 IMPLANT
ELECT BLADE 4.0 EZ CLEAN MEGAD (MISCELLANEOUS) ×2
ELECT BLADE 6.5 EXT (BLADE) ×2 IMPLANT
ELECT CAUTERY BLADE 6.4 (BLADE) ×2 IMPLANT
ELECT REM PT RETURN 9FT ADLT (ELECTROSURGICAL) ×2
ELECTRODE BLDE 4.0 EZ CLN MEGD (MISCELLANEOUS) ×1 IMPLANT
ELECTRODE REM PT RTRN 9FT ADLT (ELECTROSURGICAL) ×1 IMPLANT
EVACUATOR SILICONE 100CC (DRAIN) IMPLANT
GAUZE SPONGE 4X4 12PLY STRL (GAUZE/BANDAGES/DRESSINGS) ×2 IMPLANT
GAUZE SPONGE 4X4 16PLY XRAY LF (GAUZE/BANDAGES/DRESSINGS) ×2 IMPLANT
GLOVE BIO SURGEON STRL SZ7 (GLOVE) ×4 IMPLANT
GLOVE BIO SURGEON STRL SZ8 (GLOVE) ×2 IMPLANT
GLOVE BIOGEL PI IND STRL 7.0 (GLOVE) ×1 IMPLANT
GLOVE BIOGEL PI IND STRL 8 (GLOVE) ×1 IMPLANT
GLOVE BIOGEL PI INDICATOR 7.0 (GLOVE) ×1
GLOVE BIOGEL PI INDICATOR 8 (GLOVE) ×1
GOWN STRL REUS W/ TWL LRG LVL3 (GOWN DISPOSABLE) ×4 IMPLANT
GOWN STRL REUS W/ TWL XL LVL3 (GOWN DISPOSABLE) ×2 IMPLANT
GOWN STRL REUS W/TWL LRG LVL3 (GOWN DISPOSABLE) ×4
GOWN STRL REUS W/TWL XL LVL3 (GOWN DISPOSABLE) ×2
GUIDEWIRE SHARP VIPER II (WIRE) ×4 IMPLANT
IV CATH 14GX2 1/4 (CATHETERS) ×2 IMPLANT
KIT BASIN OR (CUSTOM PROCEDURE TRAY) ×2 IMPLANT
KIT DILATOR XLIF 5 (KITS) ×1 IMPLANT
KIT NEEDLE NVM5 EMG ELECT (KITS) ×1 IMPLANT
KIT NEEDLE NVM5 EMG ELECTRODE (KITS) ×1
KIT POSITION SURG JACKSON T1 (MISCELLANEOUS) ×2 IMPLANT
KIT ROOM TURNOVER OR (KITS) ×2 IMPLANT
KIT SURGICAL ACCESS MAXCESS 4 (KITS) ×2 IMPLANT
KIT XLIF (KITS) ×1
MARKER SKIN DUAL TIP RULER LAB (MISCELLANEOUS) ×2 IMPLANT
NDL SAFETY ECLIPSE 18X1.5 (NEEDLE) ×1 IMPLANT
NEEDLE 22X1 1/2 (OR ONLY) (NEEDLE) ×2 IMPLANT
NEEDLE BONE MARROW 8GX6 FENEST (NEEDLE) IMPLANT
NEEDLE HYPO 18GX1.5 SHARP (NEEDLE) ×1
NEEDLE HYPO 25GX1X1/2 BEV (NEEDLE) ×2 IMPLANT
NEEDLE JAMSHIDI VIPER (NEEDLE) ×4 IMPLANT
NEEDLE SPNL 18GX3.5 QUINCKE PK (NEEDLE) ×4 IMPLANT
NS IRRIG 1000ML POUR BTL (IV SOLUTION) ×2 IMPLANT
OIL CARTRIDGE MAESTRO DRILL (MISCELLANEOUS) ×2
PACK LAMINECTOMY ORTHO (CUSTOM PROCEDURE TRAY) ×2 IMPLANT
PACK UNIVERSAL I (CUSTOM PROCEDURE TRAY) ×2 IMPLANT
PAD ARMBOARD 7.5X6 YLW CONV (MISCELLANEOUS) ×6 IMPLANT
PATTIES SURGICAL .5 X1 (DISPOSABLE) ×2 IMPLANT
PATTIES SURGICAL .5X1.5 (GAUZE/BANDAGES/DRESSINGS) ×2 IMPLANT
PLATE 2H DECADE 8MM (Plate) ×2 IMPLANT
PUTTY BONE DBX 5CC MIX (Putty) ×2 IMPLANT
ROD VIPER2 5.5X45 PRE LARDOSED (Rod) ×2 IMPLANT
SCREW 45MM (Screw) ×1 IMPLANT
SCREW POLY VIPER2 7X40MM (Screw) ×4 IMPLANT
SLEEVE SURGEON STRL (DRAPES) ×2 IMPLANT
SPONGE GAUZE 4X4 12PLY STER LF (GAUZE/BANDAGES/DRESSINGS) ×4 IMPLANT
SPONGE INTESTINAL PEANUT (DISPOSABLE) ×2 IMPLANT
SPONGE LAP 4X18 X RAY DECT (DISPOSABLE) ×2 IMPLANT
SPONGE SURGIFOAM ABS GEL 100 (HEMOSTASIS) ×2 IMPLANT
STRIP CLOSURE SKIN 1/2X4 (GAUZE/BANDAGES/DRESSINGS) ×4 IMPLANT
SURGIFLO TRUKIT (HEMOSTASIS) IMPLANT
SUT MNCRL AB 4-0 PS2 18 (SUTURE) ×2 IMPLANT
SUT PDS AB 1 CTX 36 (SUTURE) IMPLANT
SUT PROLENE 5 0 C 1 24 (SUTURE) IMPLANT
SUT SILK 2 0 TIES 10X30 (SUTURE) IMPLANT
SUT VIC AB 0 CT1 18XCR BRD 8 (SUTURE) ×1 IMPLANT
SUT VIC AB 0 CT1 8-18 (SUTURE) ×1
SUT VIC AB 1 CT1 18XCR BRD 8 (SUTURE) ×1 IMPLANT
SUT VIC AB 1 CT1 8-18 (SUTURE) ×1
SUT VIC AB 2-0 CT2 18 VCP726D (SUTURE) ×2 IMPLANT
SYR 20CC LL (SYRINGE) ×2 IMPLANT
SYR BULB IRRIGATION 50ML (SYRINGE) ×2 IMPLANT
SYR CONTROL 10ML LL (SYRINGE) ×4 IMPLANT
SYR TB 1ML LUER SLIP (SYRINGE) ×2 IMPLANT
TAP CANN VIPER2 DL 6.0 (TAP) ×2 IMPLANT
TOWEL OR 17X24 6PK STRL BLUE (TOWEL DISPOSABLE) ×2 IMPLANT
TOWEL OR 17X26 10 PK STRL BLUE (TOWEL DISPOSABLE) ×2 IMPLANT
TRAY FOLEY CATH 16FR SILVER (SET/KITS/TRAYS/PACK) ×2 IMPLANT
TRAY FOLEY CATH 16FRSI W/METER (SET/KITS/TRAYS/PACK) ×2 IMPLANT
WATER STERILE IRR 1000ML POUR (IV SOLUTION) IMPLANT
YANKAUER SUCT BULB TIP NO VENT (SUCTIONS) ×2 IMPLANT

## 2014-08-30 NOTE — Transfer of Care (Signed)
Immediate Anesthesia Transfer of Care Note  Patient: Thomas Nixon  Procedure(s) Performed: Procedure(s) with comments: ANTERIOR LATERAL LUMBAR FUSION 1 LEVEL (N/A) - Lumbar 3-4 lateral interbody fusion with instrumentation and allograft POSTERIOR LUMBAR FUSION 1 LEVEL (N/A) - Posterior spinal fusion lumbar 3-4 with instrumentation and allograft  Patient Location: PACU  Anesthesia Type:General  Level of Consciousness: sedated, patient cooperative and responds to stimulation  Airway & Oxygen Therapy: Patient Spontanous Breathing and Patient connected to nasal cannula oxygen  Post-op Assessment: Report given to PACU RN and Post -op Vital signs reviewed and stable  Post vital signs: Reviewed and stable  Complications: No apparent anesthesia complications

## 2014-08-30 NOTE — Progress Notes (Signed)
Occupational Therapy Evaluation Patient Details Name: Thomas Nixon MRN: [id number] DOB: 16-Mar-1971 Today's Date: 08/30/2014    History of Present Illness Thomas Nixon is a 43 y.o. male s/p Anterior Lateral Lumbar Fusion on 08/30/14. PMH of CVA (5 known strokes), HTN, and DM with 5 year hx of Rt leg pain.   Clinical Impression   PTA pt lived at home with his wife and was independent with ADLs and functional mobility. Pt currently requires assist for LB ADLs and functional mobility and will benefit from acute OT to increase independence through AE training, strengthening, and ADL participation.     Follow Up Recommendations  No OT follow up;Supervision/Assistance - 24 hour    Equipment Recommendations  None recommended by OT    Recommendations for Other Services       Precautions / Restrictions Precautions Precautions: Back;Fall Precaution Booklet Issued: Yes (comment) Precaution Comments: Educated pt/wife on precautions and incorporating into ADLs.  Restrictions Weight Bearing Restrictions: No      Mobility Bed Mobility Overal bed mobility: Needs Assistance Bed Mobility: Rolling;Sidelying to Sit;Sit to Sidelying Rolling: Min assist Sidelying to sit: Min assist;HOB elevated     Sit to sidelying: HOB elevated;Mod assist General bed mobility comments: VC's for sequencing and technique. (A) to manage LEs off/on bed. (A) for trunk control with return to sidelying.   Transfers Overall transfer level: Needs assistance Equipment used: Rolling walker (2 wheeled) Transfers: Sit to/from Stand Sit to Stand: Min assist         General transfer comment: (A) to stabilize and for balance during transition.         ADL Overall ADL's : Needs assistance/impaired Eating/Feeding: Independent;Sitting   Grooming: Set up;Sitting   Upper Body Bathing: Set up;Sitting   Lower Body Bathing: Minimal assistance;Sit to/from stand   Upper Body Dressing : Set up;Sitting   Lower  Body Dressing: Moderate assistance;Sit to/from stand   Toilet Transfer: Minimal assistance;RW;Ambulation (pt took 4 steps forward, then backward to return to bed)     Toileting - Clothing Manipulation Details (indicate cue type and reason): Educated pt/wife on use of tongs and baby wipes for increased independence with toilet hygiene.        General ADL Comments: Pt is very pleasant and agreeable to therapy. Educated pt/wife on some ways to incorporate back precautions into ADLs including keeping things on the counter, log roll technique, and compensatory techniques for LB ADLs. Pt has witty sense of humor.      Vision  Pt reports no change from baseline.                    Perception Perception Perception Tested?: No   Praxis Praxis Praxis tested?: Within functional limits    Pertinent Vitals/Pain Pain Assessment: 0-10 Pain Score: 4  Pain Location: surgical site; Lt leg Pain Descriptors / Indicators: Aching;Sore Pain Intervention(s): Limited activity within patient's tolerance;Monitored during session;Repositioned     Hand Dominance Right   Extremity/Trunk Assessment Upper Extremity Assessment Upper Extremity Assessment: Generalized weakness   Lower Extremity Assessment Lower Extremity Assessment: Generalized weakness   Cervical / Trunk Assessment Cervical / Trunk Assessment: Normal   Communication Communication Communication: No difficulties   Cognition Arousal/Alertness: Awake/alert Behavior During Therapy: WFL for tasks assessed/performed Overall Cognitive Status: Within Functional Limits for tasks assessed (at baseline, per wife)  Home Living Family/patient expects to be discharged to:: Private residence Living Arrangements: Spouse/significant other Available Help at Discharge: Family;Available 24 hours/day (until sunday; then mother/MIL coming to help) Type of Home: Other(Comment) Home Access: Stairs to  enter Entrance Stairs-Number of Steps: 3 Entrance Stairs-Rails: Right (going up the stairs) Home Layout: Two level Alternate Level Stairs-Number of Steps: flight Alternate Level Stairs-Rails: Right;Left Bathroom Shower/Tub: Tub/shower unit Shower/tub characteristics: Engineer, building services: Standard     Home Equipment: Shower seat          Prior Functioning/Environment Level of Independence: Independent             OT Diagnosis: Generalized weakness;Acute pain   OT Problem List: Decreased strength;Decreased range of motion;Decreased activity tolerance;Impaired balance (sitting and/or standing);Decreased knowledge of use of DME or AE;Decreased knowledge of precautions;Pain   OT Treatment/Interventions: Self-care/ADL training;Therapeutic exercise;Energy conservation;DME and/or AE instruction;Therapeutic activities;Patient/family education;Balance training    OT Goals(Current goals can be found in the care plan section) Acute Rehab OT Goals Patient Stated Goal: to do things right OT Goal Formulation: With patient Time For Goal Achievement: 09/13/14 Potential to Achieve Goals: Good ADL Goals Pt Will Perform Grooming: with supervision;standing Pt Will Perform Lower Body Bathing: with supervision;sit to/from stand Pt Will Perform Lower Body Dressing: with supervision;sit to/from stand Pt Will Transfer to Toilet: with supervision;ambulating Pt Will Perform Toileting - Clothing Manipulation and hygiene: with supervision;sit to/from stand;with adaptive equipment Pt Will Perform Tub/Shower Transfer: Tub transfer;with supervision;ambulating;shower seat;rolling walker Additional ADL Goal #1: Pt will verbalize 3/3 back precautions for increased safety with ADLs.   OT Frequency: Min 2X/week    End of Session Equipment Utilized During Treatment: Gait belt;Rolling walker Nurse Communication: Mobility status  Activity Tolerance: Patient tolerated treatment well Patient left: in  bed;with call bell/phone within reach;with family/visitor present   Time: 0240-9735 OT Time Calculation (min): 29 min Charges:  OT General Charges $OT Visit: 1 Procedure OT Evaluation $Initial OT Evaluation Tier I: 1 Procedure OT Treatments $Self Care/Home Management : 23-37 mins  Rae Lips 08/30/2014, 6:39 PM  Thomas Nixon, OTR/L Occupational Therapist [phone] (pager)

## 2014-08-30 NOTE — Op Note (Signed)
NAME:  Thomas Nixon, Thomas Nixon NO.:  [id number]  MEDICAL RECORD NO.:  [id number]  LOCATION:  [id number]                        FACILITY:  [facility]  PHYSICIAN:  Estill Bamberg, MD      DATE OF BIRTH:  1971-05-17  DATE OF PROCEDURE:  08/30/2014                              OPERATIVE REPORT   PREOPERATIVE DIAGNOSES: 1. L3-4 spondylolisthesis. 2. Bilateral L3 pars interarticularis defect. 3. Right-sided lumbar radiculopathy, diagnosed by EMG. 4. L4-5 spinal stenosis.  POSTOPERATIVE DIAGNOSES: 1. L3-4 spondylolisthesis. 2. Bilateral L3 pars interarticularis defect. 3. Right-sided lumbar radiculopathy, diagnosed by EMG. 4. L4-5 spinal stenosis.  PROCEDURE: 1. Anterior lumbar interbody fusion, L3-4, utilizing a left-sided     lateral approach, minimally invasive. 2. Insertion of interbody device x1 (8 mm x 16 mm x 45 mm NuVasive     interbody spacer). 3. Use of morselized allograft. 4. Placement of anterior instrumentation, L3-4. 5. Posterior spinal fusion, L3-4. 6. Placement of posterior instrumentation, L3-4 (7 x 40 mm screws). 7. Intraoperative use of floroscopy  SURGEON:  Estill Bamberg, MD  ASSISTANT:  Jason Coop, PA-C  ANESTHESIA:  General endotracheal anesthesia.  COMPLICATIONS:  None.  DISPOSITION:  Stable.  ESTIMATED BLOOD LOSS:  Minimal.  INDICATIONS FOR SURGERY:  Briefly, Thomas Nixon is a 43 year old male who did present to me with ongoing and severe pain in his right leg.  Of note, the patient's pain has been present for 5 years.  He did fail multiple forms of conservative care.  The patient's MRI was notable for the findings reflected above.  Again, an EMG was notable for right lumbar radiculopathy.  Given the patient's ongoing severe debilitating pain, we did discuss proceeding with the procedure noted above.  The patient did fully understand the risks and limitations of the procedure and did elect to proceed.  OPERATIVE DETAILS:  On August 30, 2014,  the patient was brought to surgery and general endotracheal anesthesia was administered.  The patient was placed in the lateral decubitus position, with the left side up.  The hips and knees were flexed.  An axillary roll was placed into the patient's right axilla.  All bony prominences were meticulously padded.  The patient was then secured to the bed.  Of note, neurologic monitoring was used throughout the surgery.  Also of note, there was no abnormal sustained EMG activity throughout the entire surgery.  A time- out procedure was performed.  Once the appropriate AP and lateral fluoroscopic images were obtained, I did make a left-sided transverse incision in line with the L3-4 interspace.  The fascia was incised.  The external and internal oblique musculature was dissected, as was the transversalis fascia.  The retroperitoneal space was readily encountered.  The lateral femoral cutaneous nerve was identified and retracted posteriorly.  I then bluntly dissected through the psoas on the left side.  I did advance the probe across the psoas musculature.  I did use triggered EMG while advancing the probe.  It was clear that there were neurologic structures immediately behind the probe.  I then sequentially dilated.  Again, the lumbar plexus was noted to be posterior to the dilators, however, I was able to safely secure  retractor over the dilators.  The posterior aspect of the retractor was also tested using triggered EMG.  The retractor was then docked to the bed using a rigid arm.  I then opened the dilator to optimize visualization of the intervertebral space.  I did use a probe to test the visualized exposed area, and there was no neurologic structures visualized, there was no muscle response elicited when probing the entire field.  I then placed a shim down the posterior blade.  I then used a 15-blade knife and performed annulotomy at the lateral aspect of the annulus.  I then used a  series of curettes and pituitary rongeurs and Kerrison punches to perform a thorough and complete diskectomy.  The contralateral anulus was released using a Cobb.  I did liberally use AP and lateral fluoroscopy while performing the diskectomy.  Once the endplates were appropriately prepared, I placed a series of trials.  The appropriate-sized interbody spacer was packed with DBX mix and tamped into position in the usual fashion.  I was very pleased with the press-fit of the implant.  I then placed a 2- hole plate over the lateral aspect of the lumbar spine and 45-mm screws were placed.  One in the L3 vertebral body, and one in the L4 vertebral body.  The screws were then locked to the plate.  The plate was then locked into position in the standard fashion.  Again, there was no sustained abnormal EMG activity noted throughout the entire surgery.  I was very pleased with the press-fit of the screws and the appearance of the AP and lateral fluoroscopic images.  The retractor was then removed and I did explore the wound for any undue bleeding, and none was encountered.  The fascia was then closed using #1 Vicryl.  The subcutaneous layer was then closed using 2-0 Vicryl followed by 3-0 Monocryl.  I then turned my attention towards the lumbar fusion and instrumentation.  A 3-cm incision was made, just lateral to the pedicles, in line with the L3 and L4 pedicles.  I then exposed the posterolateral gutter on the left side.  A high-speed bur was used to decorticate the posterolateral gutter, including the transverse processes.  The remainder of the DBX mix was packed into the posterolateral gutter, to aide in the success of the fusion.  I then used Jamshidi needles, which were advanced across the L3 and L4 pedicles on the left side.  I did use AP and lateral fluoroscopy while advancing the Jamshidi needles and was very pleased with the appearance.  I then used a 6-mm tap over the guidewires.   Triggered EMG was used to test the taps, and there was no sustained response below 20 milliamps.  I then placed 7 x 40 mm screws.  A 45-mm rod was secured into the tulips of the screws.  Caps were then placed and a final locking procedure was performed.  I then obtained AP and lateral fluoroscopic views, I was very pleased with the appearance on AP and lateral fluoroscopy.  The wound was then copiously irrigated.  The fascia was then closed using #1 Vicryl followed by 2-0 Vicryl.  The skin was then closed using 3-0 Monocryl.  Benzoin and Steri-Strips were applied followed by sterile dressing.  All instrument counts were correct at the termination of the procedure.  Of note, Jason CoopKayla McKenzie was my assistant throughout the entire surgery and did aid in retraction, suctioning, and closure.     Estill BambergMark Joanny Dupree, MD  MD/MEDQ  D:  08/30/2014  T:  08/30/2014  Job:  [id number]  cc:   Ardell Isaacs, MD

## 2014-08-30 NOTE — Anesthesia Preprocedure Evaluation (Signed)
Anesthesia Evaluation  Patient identified by MRN, date of birth, ID band Patient awake    Reviewed: Allergy & Precautions, H&P , NPO status , Patient's Chart, lab work & pertinent test results  Airway Mallampati: II  Neck ROM: full    Dental   Pulmonary sleep apnea ,          Cardiovascular hypertension, + Peripheral Vascular Disease     Neuro/Psych  Headaches, CVA    GI/Hepatic   Endo/Other  diabetes, Type 2obese  Renal/GU      Musculoskeletal   Abdominal   Peds  Hematology   Anesthesia Other Findings   Reproductive/Obstetrics                           Anesthesia Physical Anesthesia Plan  ASA: III  Anesthesia Plan: General   Post-op Pain Management:    Induction: Intravenous  Airway Management Planned: Oral ETT  Additional Equipment:   Intra-op Plan:   Post-operative Plan: Extubation in OR  Informed Consent: I have reviewed the patients History and Physical, chart, labs and discussed the procedure including the risks, benefits and alternatives for the proposed anesthesia with the patient or authorized representative who has indicated his/her understanding and acceptance.     Plan Discussed with: CRNA, Anesthesiologist and Surgeon  Anesthesia Plan Comments:         Anesthesia Quick Evaluation

## 2014-08-30 NOTE — Anesthesia Procedure Notes (Signed)
Procedure Name: Intubation Date/Time: 08/30/2014 7:45 AM Performed by: Wray Kearns A Pre-anesthesia Checklist: Patient identified, Timeout performed, Emergency Drugs available, Suction available and Patient being monitored Patient Re-evaluated:Patient Re-evaluated prior to inductionOxygen Delivery Method: Circle system utilized Preoxygenation: Pre-oxygenation with 100% oxygen Intubation Type: IV induction and Cricoid Pressure applied Ventilation: Mask ventilation without difficulty Laryngoscope Size: Mac and 4 Grade View: Grade I Tube type: Oral Tube size: 8.0 mm Number of attempts: 1 Airway Equipment and Method: Stylet Placement Confirmation: ETT inserted through vocal cords under direct vision,  breath sounds checked- equal and bilateral and positive ETCO2 Secured at: 24 cm Tube secured with: Tape Dental Injury: Teeth and Oropharynx as per pre-operative assessment

## 2014-08-30 NOTE — Progress Notes (Signed)
Utilization review completed.  

## 2014-08-31 LAB — GLUCOSE, CAPILLARY
Glucose-Capillary: 97 mg/dL (ref 70–99)
Glucose-Capillary: 111 mg/dL — ABNORMAL HIGH (ref 70–99)
Glucose-Capillary: 131 mg/dL — ABNORMAL HIGH (ref 70–99)

## 2014-08-31 NOTE — Progress Notes (Signed)
Pt ambulated in hall with nsg staff x2 tol well

## 2014-08-31 NOTE — Evaluation (Signed)
Physical Therapy Evaluation Patient Details Name: Thomas Nixon MRN: [id number] DOB: 09-May-1971 Today's Date: 08/31/2014   History of Present Illness  Thomas Nixon is a 42 y.o. male s/p Anterior Lateral Lumbar Fusion on 08/30/14. PMH of CVA (5 known strokes), HTN, and DM with 5 year hx of Rt leg pain.  Clinical Impression  Patient is s/p above surgery resulting in the deficits listed below (see PT Problem List).  Patient will benefit from skilled PT to increase their independence and safety with mobility (while adhering to their precautions) to allow discharge home with wife. Patient needs to practice stairs next session.        Follow Up Recommendations No PT follow up;Supervision/Assistance - 24 hour    Equipment Recommendations  Rolling walker with 5" wheels    Recommendations for Other Services       Precautions / Restrictions Precautions Precautions: Back;Fall Precaution Booklet Issued: Yes (comment) Precaution Comments: reviewed back precautions with pt and wife Restrictions Weight Bearing Restrictions: No      Mobility  Bed Mobility Overal bed mobility: Needs Assistance Bed Mobility: Rolling;Sidelying to Sit Rolling: Min guard Sidelying to sit: Min assist       General bed mobility comments: bed flatttened to simulate home; min guard to facilitate proper technique with log rolling and min (A) to power up from sidelying; cues for technique and sequencing  Transfers Overall transfer level: Needs assistance Equipment used: Rolling walker (2 wheeled) Transfers: Sit to/from Stand Sit to Stand: Min assist;From elevated surface         General transfer comment: min (A) to power up and cues for hand placement/technique   Ambulation/Gait Ambulation/Gait assistance: Min guard Ambulation Distance (Feet): 110 Feet Assistive device: Rolling walker (2 wheeled) Gait Pattern/deviations: Step-through pattern;Decreased stride length;Narrow base of support;Trunk  flexed Gait velocity: guarded due to pain  Gait velocity interpretation: Below normal speed for age/gender General Gait Details: multimodal cues for upright posture; cues for incr stride length and safety with RW   Stairs            Wheelchair Mobility    Modified Rankin (Stroke Patients Only)       Balance Overall balance assessment: Needs assistance Sitting-balance support: Feet supported;No upper extremity supported Sitting balance-Leahy Scale: Good Sitting balance - Comments: sat EOB ~5 min; denied any dizziness   Standing balance support: During functional activity;Bilateral upper extremity supported Standing balance-Leahy Scale: Poor Standing balance comment: relied heavily on RW for UE support                             Pertinent Vitals/Pain Pain Assessment: 0-10 Pain Score: 6  Pain Location: surgical site Pain Descriptors / Indicators: Sore Pain Intervention(s): PCA encouraged;Repositioned;Monitored during session    Home Living Family/patient expects to be discharged to:: Private residence Living Arrangements: Spouse/significant other Available Help at Discharge: Family;Available 24 hours/day Type of Home: Other(Comment) Home Access: Stairs to enter Entrance Stairs-Rails: Right Entrance Stairs-Number of Steps: 3 Home Layout: Two level Home Equipment: Shower seat      Prior Function Level of Independence: Independent         Comments: pt very active     Hand Dominance   Dominant Hand: Right    Extremity/Trunk Assessment   Upper Extremity Assessment: Defer to OT evaluation           Lower Extremity Assessment: Generalized weakness      Cervical / Trunk Assessment: Normal  Communication   Communication: No difficulties  Cognition Arousal/Alertness: Awake/alert Behavior During Therapy: WFL for tasks assessed/performed Overall Cognitive Status: Within Functional Limits for tasks assessed       Memory: Decreased  recall of precautions              General Comments      Exercises        Assessment/Plan    PT Assessment Patient needs continued PT services  PT Diagnosis Abnormality of gait;Generalized weakness;Acute pain   PT Problem List Decreased strength;Decreased balance;Decreased mobility;Decreased knowledge of use of DME;Decreased knowledge of precautions;Pain;Decreased activity tolerance  PT Treatment Interventions DME instruction;Gait training;Stair training;Functional mobility training;Therapeutic activities;Therapeutic exercise;Balance training;Neuromuscular re-education;Patient/family education   PT Goals (Current goals can be found in the Care Plan section) Acute Rehab PT Goals Patient Stated Goal: to go home and get better PT Goal Formulation: With patient Time For Goal Achievement: 09/07/14 Potential to Achieve Goals: Good    Frequency Min 5X/week   Barriers to discharge Inaccessible home environment multiple steps to get into bedroom    Co-evaluation               End of Session Equipment Utilized During Treatment: Gait belt Activity Tolerance: Patient tolerated treatment well Patient left: in chair;with call bell/phone within reach;with family/visitor present Nurse Communication: Mobility status;Precautions         Time: 0912-0932 PT Time Calculation (min): 20 min   Charges:   PT Evaluation $Initial PT Evaluation Tier I: 1 Procedure PT Treatments $Gait Training: 8-22 mins   PT G CodesDonell Sievert:          Jeni Duling N, [place]PT  [phone] 08/31/2014, 10:10 AM

## 2014-08-31 NOTE — Progress Notes (Signed)
OT Cancellation Note  Patient Details Name: PRITHVI MERRIFIELD MRN: [id number] DOB: March 13, 1971   Cancelled Treatment:    Reason Eval/Treat Not Completed: Patient declined, no reason specified. Pt reports he has walked x3 today and is fatigued. Asked that OT return tomorrow prior to d/c. OT will reattempt tomorrow as available.   Nena Jordan M  Carney Living, OTR/L Occupational Therapist [phone] (pager)  08/31/2014, 5:35 PM

## 2014-08-31 NOTE — Progress Notes (Signed)
    Patient doing well Right leg pain is improved Minimal back pain   Physical Exam: Filed Vitals:   08/31/14 0537  BP: 123/74  Pulse: 84  Temp: 98.3 F (36.8 C)  Resp: 20   Patient looks excellent Dressing in place NVI  POD #1 s/p A/P L3/4 fusion  - up with PT/OT, encourage ambulation - Percocet for pain, Valium for muscle spasms - likely d/c home tomorow

## 2014-09-01 MED ORDER — OXYCODONE-ACETAMINOPHEN 5-325 MG PO TABS: 1.0000 | ORAL_TABLET | ORAL | Status: DC | PRN

## 2014-09-01 MED ORDER — DIAZEPAM 5 MG PO TABS: 5.0000 mg | ORAL_TABLET | Freq: Four times a day (QID) | ORAL | Status: DC | PRN

## 2014-09-01 NOTE — Progress Notes (Addendum)
Subjective: 2 Days Post-Op Procedure(s) (LRB): ANTERIOR LATERAL LUMBAR FUSION 1 LEVEL (N/A) POSTERIOR LUMBAR FUSION 1 LEVEL (N/A) Patient reports pain as moderate.  Denies leg pain.  The patient is up to chair.  Getting up to bathroom without difficulty.  Taking by mouth and voiding well.  Good progress with physical therapy.  Objective: Vital signs in last 24 hours: Temp:  [98.2 F (36.8 C)-99.1 F (37.3 C)] 99.1 F (37.3 C) (10/10 0524) Pulse Rate:  [77-85] 77 (10/10 0524) Resp:  [14-16] 16 (10/10 0524) BP: (123-142)/(74-87) 142/87 mmHg (10/10 0524) SpO2:  [97 %-99 %] 97 % (10/10 0524)  Intake/Output from previous day: 10/09 0701 - 10/10 0700 In: 240 [P.O.:240] Out: 200 [Urine:200] Intake/Output this shift:   Lumbar spine exam: Lumbar dressing is clean and dry.   Bilateral calves are soft.  Good motor strength and sensation in lower extremities.    Assessment/Plan: 2 Days Post-Op Procedure(s) (LRB): ANTERIOR LATERAL LUMBAR FUSION 1 LEVEL (N/A) POSTERIOR LUMBAR FUSION 1 LEVEL (N/A) Plan:  Discharge home.  Rx's given. Followup with Dr Yevette Edwards on 09/12/14.   Thomas Nixon,Stein G 09/01/2014, 9:02 AM

## 2014-09-01 NOTE — Progress Notes (Signed)
Physical Therapy Treatment Patient Details Name: Thomas Nixon MRN: [id number] DOB: [birth year] Today's Date: 09/01/2014    History of Present Illness Thomas Clarida. Nixon is a 43 y.o. male s/p Anterior Lateral Lumbar Fusion on 08/30/14. PMH of CVA (5 known strokes), HTN, and DM with 5 year hx of Rt leg pain.    PT Comments    Progressing well. ABle to complete stair training. Patient safe to D/C from a mobility standpoint based on progression towards goals set on PT eval.    Follow Up Recommendations  No PT follow up;Supervision/Assistance - 24 hour     Equipment Recommendations  Rolling walker with 5" wheels    Recommendations for Other Services       Precautions / Restrictions Precautions Precautions: Back;Fall Precaution Booklet Issued: Yes (comment) Precaution Comments: Reviewed back precautions Restrictions Weight Bearing Restrictions: No    Mobility  Bed Mobility Overal bed mobility: Needs Assistance Bed Mobility: Rolling;Sidelying to Sit;Sit to Sidelying Rolling: Supervision Sidelying to sit: Supervision     Sit to sidelying: Min assist;Mod assist General bed mobility comments: cues for technique.   Transfers Overall transfer level: Needs assistance Equipment used: Rolling walker (2 wheeled) Transfers: Sit to/from Stand Sit to Stand: Supervision         General transfer comment: cues for technique.  Ambulation/Gait Ambulation/Gait assistance: Supervision Ambulation Distance (Feet): 300 Feet Assistive device: Rolling walker (2 wheeled) Gait Pattern/deviations: Step-through pattern     General Gait Details: Cues for increasing step length   Stairs Stairs: Yes Stairs assistance: Min guard Stair Management: Step to pattern;Forwards;Two rails Number of Stairs: 5 General stair comments: Cues for technique. Minguard for safety.   Wheelchair Mobility    Modified Rankin (Stroke Patients Only)       Balance                                     Cognition Arousal/Alertness: Awake/alert Behavior During Therapy: WFL for tasks assessed/performed Overall Cognitive Status: Within Functional Limits for tasks assessed                      Exercises      General Comments        Pertinent Vitals/Pain Pain Assessment: 0-10 Pain Score: 4  Pain Location: back Pain Descriptors / Indicators: Aching Pain Intervention(s): Monitored during session    Home Living                      Prior Function            PT Goals (current goals can now be found in the care plan section) Acute Rehab PT Goals Patient Stated Goal: not stated Progress towards PT goals: Progressing toward goals    Frequency  Min 5X/week    PT Plan Current plan remains appropriate    Co-evaluation             End of Session Equipment Utilized During Treatment: Gait belt Activity Tolerance: Patient tolerated treatment well Patient left: in chair;with call bell/phone within reach;with family/visitor present     Time: 9292-4462 PT Time Calculation (min): 16 min  Charges:  $Gait Training: 8-22 mins                    G Codes:      Fredrich Birks 09/01/2014, 11:50 AM  09/01/2014 Fredrich Birks PTA [phone]  pager [phone] office

## 2014-09-01 NOTE — Progress Notes (Addendum)
Occupational Therapy Treatment Patient Details Name: Thomas Nixon MRN: [id number] DOB: 1971/05/15 Today's Date: 09/01/2014    History of present illness Lemual Platts. Beserra is a 43 y.o. male s/p Anterior Lateral Lumbar Fusion on 08/30/14. PMH of CVA (5 known strokes), HTN, and DM with 5 year hx of Rt leg pain.   OT comments  Pt progressing. Education provided to pt and spouse.   Follow Up Recommendations  No OT follow up;Supervision/Assistance - 24 hour    Equipment Recommendations  3 in 1 bedside comode    Recommendations for Other Services      Precautions / Restrictions Precautions Precautions: Back;Fall Precaution Booklet Issued: Yes (comment) Precaution Comments: Reviewed back precautions Restrictions Weight Bearing Restrictions: No       Mobility Bed Mobility Overal bed mobility: Needs Assistance Bed Mobility: Rolling;Sidelying to Sit;Sit to Sidelying Rolling: Min assist Sidelying to sit: Min guard     Sit to sidelying: Min assist;Mod assist General bed mobility comments: cues for technique. Practiced 2x and wife assisted second time.  Transfers Overall transfer level: Needs assistance Equipment used: Rolling walker (2 wheeled) Transfers: Sit to/from Stand Sit to Stand: Min guard         General transfer comment: cues for technique.    Balance                                   ADL Overall ADL's : Needs assistance/impaired     Grooming: Wash/dry face;Oral care;Set up;Supervision/safety;Standing               Lower Body Dressing: Sitting/lateral leans;Moderate assistance (donned/doffed one sock)   Toilet Transfer: Ambulation;RW;Comfort height toilet;Supervision/safety   Toileting- Clothing Manipulation and Hygiene: Supervision/safety (standing)       Functional mobility during ADLs: Min guard;Rolling walker;Supervision/safety General ADL Comments: Educated on AE for LB ADLs including toilet aide. Educated on safety tips for home  (safe shoewear, rugs, pet, use of bag on walker, sitting for most of LB ADLs). Educated on use of cup for teeth care and placement of grooming items to avoid breaking precautions. Ambulated in hallway. Discussed 3 in 1. Discussed position of pillows. Pt plans to sponge bathe.       Vision                     Perception     Praxis      Cognition   Behavior During Therapy: WFL for tasks assessed/performed Overall Cognitive Status: Within Functional Limits for tasks assessed                       Extremity/Trunk Assessment               Exercises     Shoulder Instructions       General Comments      Pertinent Vitals/ Pain       Pain Assessment: 0-10 Pain Score: 7  Pain Location: back Pain Descriptors / Indicators: Aching Pain Intervention(s): Repositioned;Monitored during session  Home Living                                          Prior Functioning/Environment              Frequency Min 2X/week     Progress Toward Goals  OT Goals(current goals can now be found in the care plan section)  Progress towards OT goals: Progressing toward goals  Acute Rehab OT Goals Patient Stated Goal: not stated OT Goal Formulation: With patient Time For Goal Achievement: 09/13/14 Potential to Achieve Goals: Good ADL Goals Pt Will Perform Grooming: with supervision;standing Pt Will Perform Lower Body Bathing: with supervision;sit to/from stand Pt Will Perform Lower Body Dressing: with supervision;sit to/from stand Pt Will Transfer to Toilet: with supervision;ambulating Pt Will Perform Toileting - Clothing Manipulation and hygiene: with supervision;sit to/from stand;with adaptive equipment Pt Will Perform Tub/Shower Transfer: Tub transfer;with supervision;ambulating;shower seat;rolling walker Additional ADL Goal #1: Pt will verbalize 3/3 back precautions for increased safety with ADLs.  Additional ADL Goal #2: Pt will perform bed  mobility at Mod I level as precursor for ADLs.   Plan Discharge plan remains appropriate    Co-evaluation                 End of Session Equipment Utilized During Treatment: Gait belt;Rolling walker   Activity Tolerance Patient tolerated treatment well   Patient Left in bed;with call bell/phone within reach;with family/visitor present   Nurse Communication  (DME; sweating)        Time: [id number] OT Time Calculation (min): 30 min  Charges: OT General Charges $OT Visit: 1 Procedure OT Treatments $Self Care/Home Management : 8-22 mins $Therapeutic Activity: 8-22 mins  Earlie RavelingStraub, Kaiyden Simkin L OTR/L [phone] 09/01/2014, 9:25 AM

## 2014-09-02 NOTE — Care Management Note (Addendum)
    Page 1 of 2   09/02/2014     10:27:33 AM CARE MANAGEMENT NOTE 09/02/2014  Patient:  Thomas Nixon,Thomas Nixon   Account Number:  [id number]  Date Initiated:  09/01/2014  Documentation initiated by:  [facility]FFRIES,Thomas Shuping  Subjective/Objective Assessment:   adm: ANTERIOR LATERAL LUMBAR FUSION 1 LEVEL (N/A)  POSTERIOR LUMBAR FUSION 1 LEVEL (N/A)     Action/Plan:   discharge planning   Anticipated DC Date:  09/01/2014   Anticipated DC Plan:  HOME/SELF CARE      DC Planning Services  CM consult      Choice offered to / List presented to:     DME arranged  3-N-1  WALKER - ROLLING      DME agency  [facility]        Status of service:  Completed, signed off Medicare Important Message given?   (If response is "NO", the following Medicare IM given date fields will be blank) Date Medicare IM given:   Medicare IM given by:   Date Additional Medicare IM given:   Additional Medicare IM given by:    Discharge Disposition:  HOME/SELF CARE  Per UR Regulation:    If discussed at Long Length of Stay Meetings, dates discussed:    Comments:  09/02/14 10:25 Per [facility] Thomas Nixon(Lara) request, CM faxed orders and facesheet to [phone] for DMe to be delivered to pt 09/03/14.  No other CM needs were communicated.  Thomas JakschSarah Milyn Nixon, BSN, Thomas AdaM [phone].  09/01/14 12:00 CM received call from RN to arrange 3n1 and rolling walker.  CM called [facility] to arrange.  [facility] to state he was out of equipment and would not be able to deliver until Monday evening 09/03/14.  CM cancelled order. Cm called [employer] and rep stated her system is down and will give me a callback.  CM DID NOT RECEIVE A CALLBACK.  CM arranged for pt to go home with a loaner rolling walker and 3n1 and will rearrange for [facility] to deliver necessary DME Monday evening. Pt states he will return loaner equipment [facility]  This action was taken to prevent an extended hospital stay  for stable pt ready for discharge.  No other CM needs were communicated.  Thomas JakschSarah Lorrena Nixon, BSN, CM [phone].

## 2014-09-04 NOTE — Anesthesia Postprocedure Evaluation (Signed)
Anesthesia Post Note  Patient: Donnetta SimpersJames E Rought  Procedure(s) Performed: Procedure(s) (LRB): ANTERIOR LATERAL LUMBAR FUSION 1 LEVEL (N/A) POSTERIOR LUMBAR FUSION 1 LEVEL (N/A)  Anesthesia type: General  Patient location: PACU  Post pain: Pain level controlled and Adequate analgesia  Post assessment: Post-op Vital signs reviewed, Patient's Cardiovascular Status Stable, Respiratory Function Stable, Patent Airway and Pain level controlled  Last Vitals:  Filed Vitals:   09/01/14 1542  BP: 142/90  Pulse: 85  Temp: 37 C  Resp: 16    Post vital signs: Reviewed and stable  Level of consciousness: awake, alert  and oriented  Complications: No apparent anesthesia complications

## 2014-09-05 ENCOUNTER — Encounter (HOSPITAL_COMMUNITY): Payer: Self-pay | Admitting: Orthopedic Surgery

## 2014-10-04 NOTE — Discharge Summary (Signed)
Patient ID: [name] MRN: [id number] DOB/AGE: 27-Jul-1971 43 y.o.  Admit date: 08/30/2014 Discharge date: 09/01/2014  Admission Diagnoses:  Active Problems:   Radiculopathy   Discharge Diagnoses:  Same  Past Medical History  Diagnosis Date  . Stroke     5 strokes  . Hypertension   . CVA (cerebral infarction)   . Neuropathy   . Thoracic or lumbosacral neuritis or radiculitis, unspecified   . Other and unspecified hyperlipidemia   . Encounter for long-term (current) use of other medications   . Unspecified late effects of cerebrovascular disease   . Unspecified sleep apnea     does not use cpap  . Diabetes mellitus without complication     Type 2  . Headache(784.0)     Surgeries: Procedure(s): ANTERIOR LATERAL LUMBAR FUSION 1 LEVEL POSTERIOR LUMBAR FUSION 1 LEVEL on 08/30/2014   Consultants:  None  Discharged Condition: Improved  Hospital Course: Thomas Nixon is an 43 y.o. male who was admitted 08/30/2014 for operative treatment of radiculopathy. Patient has severe unremitting pain that affects sleep, daily activities, and work/hobbies. After pre-op clearance the patient was taken to the operating room on 08/30/2014 and underwent  Procedure(s): ANTERIOR LATERAL LUMBAR FUSION 1 LEVEL L3-4 POSTERIOR LUMBAR FUSION 1 LEVEL L3-4.    Patient was given perioperative antibiotics:  Anti-infectives    Start     Dose/Rate Route Frequency Ordered Stop   08/30/14 1800  ceFAZolin (ANCEF) IVPB 1 g/50 mL premix     1 g100 mL/hr over 30 Minutes Intravenous Every 8 hours 08/30/14 1443 08/31/14 0235   08/30/14 0600  ceFAZolin (ANCEF) IVPB 2 g/50 mL premix     2 g100 mL/hr over 30 Minutes Intravenous On call to O.R. 08/29/14 1417 08/30/14 1150       Patient was given sequential compression devices, early ambulation to prevent DVT.  Patient benefited maximally from hospital stay and there were no complications.    Recent vital signs: BP 142/90 mmHg  Pulse 85  Temp(Src)  98.6 F (37 C) (Oral)  Resp 16  Wt 106.369 kg (234 lb 8 oz)  SpO2 100%    Discharge Medications:     Medication List    STOP taking these medications        clopidogrel 75 MG tablet  Commonly known as:  PLAVIX     traMADol 50 MG tablet  Commonly known as:  ULTRAM      TAKE these medications        amLODipine 5 MG tablet  Commonly known as:  NORVASC  Take 5 mg by mouth every morning.     atorvastatin 40 MG tablet  Commonly known as:  LIPITOR  Take 1 tablet (40 mg total) by mouth at bedtime.     diazepam 5 MG tablet  Commonly known as:  VALIUM  Take 1 tablet (5 mg total) by mouth every 6 (six) hours as needed for muscle spasms.     lisinopril 20 MG tablet  Commonly known as:  PRINIVIL,ZESTRIL  Take 20 mg by mouth 2 (two) times daily.     metFORMIN 500 MG tablet  Commonly known as:  GLUCOPHAGE  Take 500 mg by mouth 2 (two) times daily with a meal.     oxyCODONE-acetaminophen 5-325 MG per tablet  Commonly known as:  PERCOCET/ROXICET  Take 1-2 tablets by mouth every 4 (four) hours as needed for moderate pain.     pregabalin 150 MG capsule  Commonly known as:  LYRICA  Take 150 mg by mouth 2 (two) times daily as needed (pain).        Diagnostic Studies: No results found.  Disposition: 01-Home or Self Care   2 Days S/P L3-4 A/P Fusion  -Written scripts for pain signed and in chart -D/C instructions sheet printed and in chart -D/C today  -F/U in office 2 weeks   Signed: Georga BoraMCKENZIE, Margie Brink J 10/04/2014, 12:45 PM

## 2014-10-11 ENCOUNTER — Ambulatory Visit: Payer: Self-pay | Admitting: Neurology

## 2014-12-10 ENCOUNTER — Telehealth: Payer: Self-pay | Admitting: Neurology

## 2014-12-11 NOTE — Telephone Encounter (Signed)
error 

## 2014-12-18 IMAGING — RF DG LUMBAR SPINE 2-3V
1 series · 2 of 2 positions shown · IV contrast (agent unspecified)
Comparison: 07/07/2014

CLINICAL DATA: L3-L4 lateral interbody fusion

EXAM:
DG C-ARM GT 120 MIN; LUMBAR SPINE - 2-3 VIEW
TECHNIQUE: Two intraoperative views of the lumbar spine submitted.
CONTRAST:  None
FLUOROSCOPY TIME:  3 min 4 seconds

[Series 1: run · 2 of 2 slices shown]
[im 1/2]
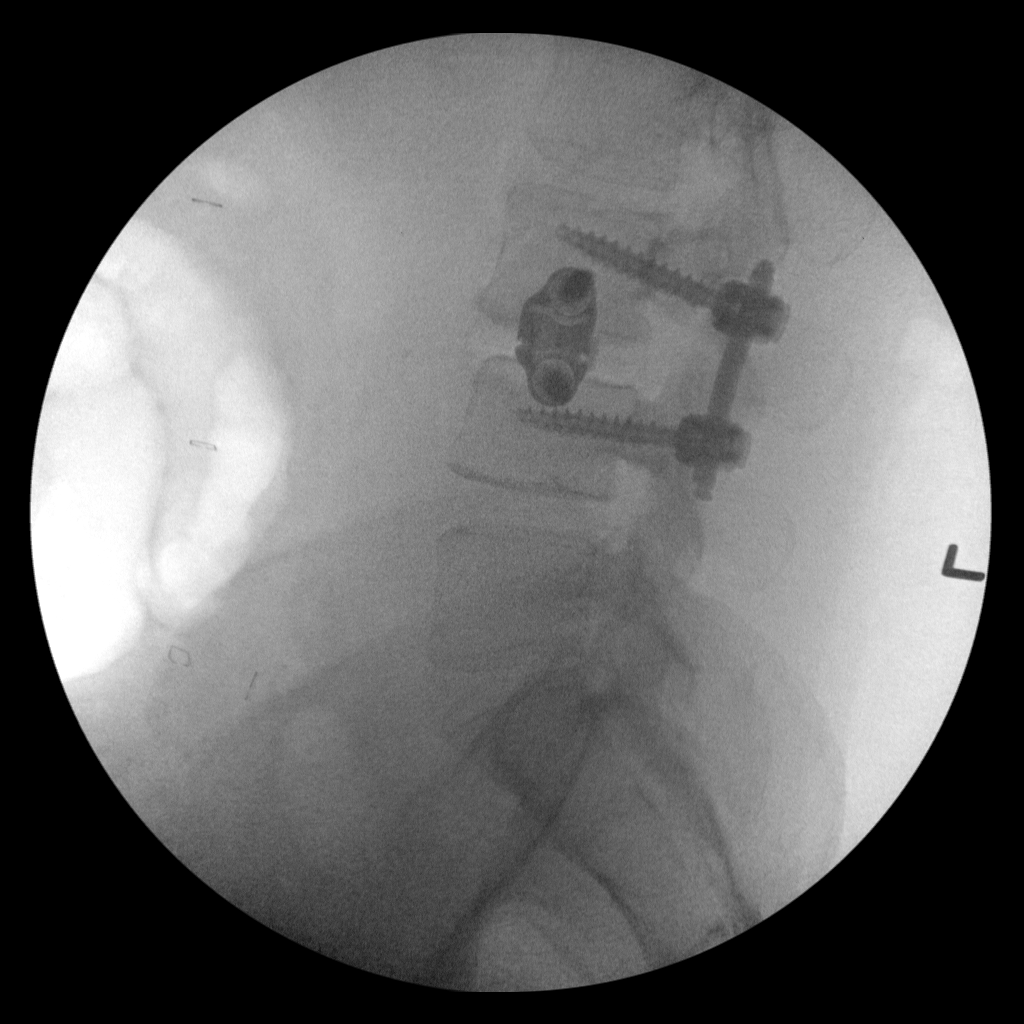
[im 2/2]
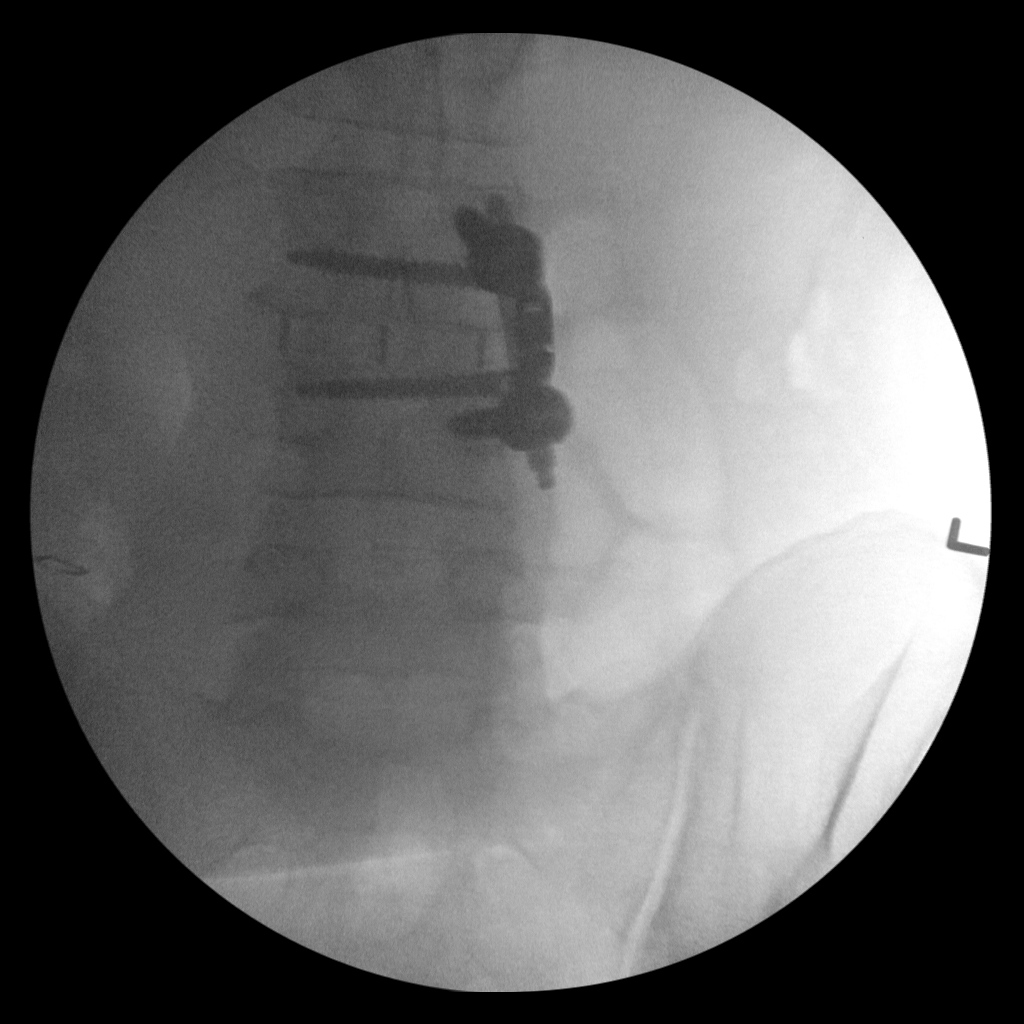

[2 of 2 positions shown; findings below may reference images not displayed]

FINDINGS: There is left lateral interbody fusion at L3-L4 level. Additional
left posterior interbody fusion is noted at L3-L4 level.
Postsurgical disc material noted at L3-L4 level.
IMPRESSION: Postsurgical changes with left lateral interbody fusion at L3-L4
level with anatomic alignment.

## 2015-06-06 DIAGNOSIS — Z0000 Encounter for general adult medical examination without abnormal findings: Secondary | ICD-10-CM | POA: Diagnosis not present

## 2015-06-20 DIAGNOSIS — Z7901 Long term (current) use of anticoagulants: Secondary | ICD-10-CM | POA: Diagnosis not present

## 2015-06-20 DIAGNOSIS — E785 Hyperlipidemia, unspecified: Secondary | ICD-10-CM | POA: Diagnosis not present

## 2015-06-20 DIAGNOSIS — E119 Type 2 diabetes mellitus without complications: Secondary | ICD-10-CM | POA: Diagnosis not present

## 2015-06-20 DIAGNOSIS — Z79899 Other long term (current) drug therapy: Secondary | ICD-10-CM | POA: Diagnosis not present

## 2015-06-20 DIAGNOSIS — R351 Nocturia: Secondary | ICD-10-CM | POA: Diagnosis not present

## 2015-06-20 DIAGNOSIS — Z125 Encounter for screening for malignant neoplasm of prostate: Secondary | ICD-10-CM | POA: Diagnosis not present

## 2015-11-19 DIAGNOSIS — M5416 Radiculopathy, lumbar region: Secondary | ICD-10-CM | POA: Diagnosis not present

## 2015-11-19 DIAGNOSIS — M545 Low back pain: Secondary | ICD-10-CM | POA: Diagnosis not present

## 2015-11-20 ENCOUNTER — Other Ambulatory Visit: Payer: Self-pay | Admitting: Orthopedic Surgery

## 2015-11-20 DIAGNOSIS — M5416 Radiculopathy, lumbar region: Secondary | ICD-10-CM

## 2015-11-30 ENCOUNTER — Other Ambulatory Visit: Payer: Medicare PPO

## 2015-12-06 ENCOUNTER — Ambulatory Visit
Admission: RE | Admit: 2015-12-06 | Discharge: 2015-12-06 | Disposition: A | Discharge status: Home / Self Care | Payer: Medicare PPO | Source: Ambulatory Visit | Attending: Orthopedic Surgery | Admitting: Orthopedic Surgery

## 2015-12-06 DIAGNOSIS — M5126 Other intervertebral disc displacement, lumbar region: Secondary | ICD-10-CM | POA: Diagnosis not present

## 2015-12-06 DIAGNOSIS — M5416 Radiculopathy, lumbar region: Secondary | ICD-10-CM

## 2016-01-09 DIAGNOSIS — I10 Essential (primary) hypertension: Secondary | ICD-10-CM | POA: Diagnosis not present

## 2016-01-09 DIAGNOSIS — Z79899 Other long term (current) drug therapy: Secondary | ICD-10-CM | POA: Diagnosis not present

## 2016-01-09 DIAGNOSIS — M5417 Radiculopathy, lumbosacral region: Secondary | ICD-10-CM | POA: Diagnosis not present

## 2016-01-09 DIAGNOSIS — E785 Hyperlipidemia, unspecified: Secondary | ICD-10-CM | POA: Diagnosis not present

## 2016-01-09 DIAGNOSIS — G4733 Obstructive sleep apnea (adult) (pediatric): Secondary | ICD-10-CM | POA: Diagnosis not present

## 2016-01-09 DIAGNOSIS — E119 Type 2 diabetes mellitus without complications: Secondary | ICD-10-CM | POA: Diagnosis not present

## 2016-01-09 DIAGNOSIS — Z7901 Long term (current) use of anticoagulants: Secondary | ICD-10-CM | POA: Diagnosis not present

## 2016-01-09 DIAGNOSIS — I679 Cerebrovascular disease, unspecified: Secondary | ICD-10-CM | POA: Diagnosis not present

## 2016-01-15 DIAGNOSIS — E785 Hyperlipidemia, unspecified: Secondary | ICD-10-CM | POA: Diagnosis not present

## 2016-01-15 DIAGNOSIS — E119 Type 2 diabetes mellitus without complications: Secondary | ICD-10-CM | POA: Diagnosis not present

## 2016-01-15 DIAGNOSIS — Z7901 Long term (current) use of anticoagulants: Secondary | ICD-10-CM | POA: Diagnosis not present

## 2016-01-15 DIAGNOSIS — Z7984 Long term (current) use of oral hypoglycemic drugs: Secondary | ICD-10-CM | POA: Diagnosis not present

## 2016-01-15 DIAGNOSIS — I10 Essential (primary) hypertension: Secondary | ICD-10-CM | POA: Diagnosis not present

## 2016-01-15 DIAGNOSIS — Z79899 Other long term (current) drug therapy: Secondary | ICD-10-CM | POA: Diagnosis not present

## 2016-01-16 DIAGNOSIS — E119 Type 2 diabetes mellitus without complications: Secondary | ICD-10-CM | POA: Diagnosis not present

## 2016-01-21 DIAGNOSIS — M545 Low back pain: Secondary | ICD-10-CM | POA: Diagnosis not present

## 2016-01-30 DIAGNOSIS — M5416 Radiculopathy, lumbar region: Secondary | ICD-10-CM | POA: Diagnosis not present

## 2016-01-30 DIAGNOSIS — M545 Low back pain: Secondary | ICD-10-CM | POA: Diagnosis not present

## 2016-02-17 DIAGNOSIS — M545 Low back pain: Secondary | ICD-10-CM | POA: Diagnosis not present

## 2016-02-17 DIAGNOSIS — M5416 Radiculopathy, lumbar region: Secondary | ICD-10-CM | POA: Diagnosis not present

## 2016-02-24 DIAGNOSIS — M7061 Trochanteric bursitis, right hip: Secondary | ICD-10-CM | POA: Diagnosis not present

## 2016-07-14 DIAGNOSIS — Z7984 Long term (current) use of oral hypoglycemic drugs: Secondary | ICD-10-CM | POA: Diagnosis not present

## 2016-07-14 DIAGNOSIS — Z6834 Body mass index (BMI) 34.0-34.9, adult: Secondary | ICD-10-CM | POA: Diagnosis not present

## 2016-07-14 DIAGNOSIS — E669 Obesity, unspecified: Secondary | ICD-10-CM | POA: Diagnosis not present

## 2016-07-14 DIAGNOSIS — E785 Hyperlipidemia, unspecified: Secondary | ICD-10-CM | POA: Diagnosis not present

## 2016-07-14 DIAGNOSIS — I10 Essential (primary) hypertension: Secondary | ICD-10-CM | POA: Diagnosis not present

## 2016-07-14 DIAGNOSIS — M159 Polyosteoarthritis, unspecified: Secondary | ICD-10-CM | POA: Diagnosis not present

## 2016-07-14 DIAGNOSIS — E1142 Type 2 diabetes mellitus with diabetic polyneuropathy: Secondary | ICD-10-CM | POA: Diagnosis not present

## 2016-07-30 DIAGNOSIS — I69311 Memory deficit following cerebral infarction: Secondary | ICD-10-CM | POA: Diagnosis not present

## 2016-07-30 DIAGNOSIS — G4733 Obstructive sleep apnea (adult) (pediatric): Secondary | ICD-10-CM | POA: Diagnosis not present

## 2016-07-30 DIAGNOSIS — Z79899 Other long term (current) drug therapy: Secondary | ICD-10-CM | POA: Diagnosis not present

## 2016-07-30 DIAGNOSIS — E119 Type 2 diabetes mellitus without complications: Secondary | ICD-10-CM | POA: Diagnosis not present

## 2016-07-30 DIAGNOSIS — E785 Hyperlipidemia, unspecified: Secondary | ICD-10-CM | POA: Diagnosis not present

## 2016-07-30 DIAGNOSIS — R4701 Aphasia: Secondary | ICD-10-CM | POA: Diagnosis not present

## 2016-07-30 DIAGNOSIS — Z7901 Long term (current) use of anticoagulants: Secondary | ICD-10-CM | POA: Diagnosis not present

## 2016-07-30 DIAGNOSIS — I10 Essential (primary) hypertension: Secondary | ICD-10-CM | POA: Diagnosis not present

## 2016-07-30 DIAGNOSIS — Z23 Encounter for immunization: Secondary | ICD-10-CM | POA: Diagnosis not present

## 2016-11-12 DIAGNOSIS — I10 Essential (primary) hypertension: Secondary | ICD-10-CM | POA: Diagnosis not present

## 2017-02-04 DIAGNOSIS — E669 Obesity, unspecified: Secondary | ICD-10-CM | POA: Diagnosis not present

## 2017-02-04 DIAGNOSIS — E119 Type 2 diabetes mellitus without complications: Secondary | ICD-10-CM | POA: Diagnosis not present

## 2017-02-04 DIAGNOSIS — G4733 Obstructive sleep apnea (adult) (pediatric): Secondary | ICD-10-CM | POA: Diagnosis not present

## 2017-02-04 DIAGNOSIS — Z7901 Long term (current) use of anticoagulants: Secondary | ICD-10-CM | POA: Diagnosis not present

## 2017-02-04 DIAGNOSIS — G894 Chronic pain syndrome: Secondary | ICD-10-CM | POA: Diagnosis not present

## 2017-02-04 DIAGNOSIS — R4701 Aphasia: Secondary | ICD-10-CM | POA: Diagnosis not present

## 2017-02-04 DIAGNOSIS — E785 Hyperlipidemia, unspecified: Secondary | ICD-10-CM | POA: Diagnosis not present

## 2017-02-04 DIAGNOSIS — M5417 Radiculopathy, lumbosacral region: Secondary | ICD-10-CM | POA: Diagnosis not present

## 2017-02-04 DIAGNOSIS — I10 Essential (primary) hypertension: Secondary | ICD-10-CM | POA: Diagnosis not present

## 2017-02-05 DIAGNOSIS — E119 Type 2 diabetes mellitus without complications: Secondary | ICD-10-CM | POA: Diagnosis not present

## 2017-02-05 DIAGNOSIS — Z7984 Long term (current) use of oral hypoglycemic drugs: Secondary | ICD-10-CM | POA: Diagnosis not present

## 2017-02-24 DIAGNOSIS — I10 Essential (primary) hypertension: Secondary | ICD-10-CM | POA: Diagnosis not present

## 2017-02-24 DIAGNOSIS — M545 Low back pain: Secondary | ICD-10-CM | POA: Diagnosis not present

## 2017-02-24 DIAGNOSIS — Z6836 Body mass index (BMI) 36.0-36.9, adult: Secondary | ICD-10-CM | POA: Diagnosis not present

## 2017-02-24 DIAGNOSIS — E785 Hyperlipidemia, unspecified: Secondary | ICD-10-CM | POA: Diagnosis not present

## 2017-02-24 DIAGNOSIS — E119 Type 2 diabetes mellitus without complications: Secondary | ICD-10-CM | POA: Diagnosis not present

## 2017-03-22 ENCOUNTER — Institutional Professional Consult (permissible substitution): Payer: Self-pay | Admitting: Neurology

## 2017-06-04 DIAGNOSIS — M48061 Spinal stenosis, lumbar region without neurogenic claudication: Secondary | ICD-10-CM | POA: Diagnosis not present

## 2017-06-04 DIAGNOSIS — I639 Cerebral infarction, unspecified: Secondary | ICD-10-CM | POA: Diagnosis not present

## 2017-06-04 DIAGNOSIS — R269 Unspecified abnormalities of gait and mobility: Secondary | ICD-10-CM | POA: Diagnosis not present

## 2017-06-04 DIAGNOSIS — E1140 Type 2 diabetes mellitus with diabetic neuropathy, unspecified: Secondary | ICD-10-CM | POA: Diagnosis not present

## 2017-06-04 DIAGNOSIS — G35 Multiple sclerosis: Secondary | ICD-10-CM | POA: Diagnosis not present

## 2017-06-04 DIAGNOSIS — I10 Essential (primary) hypertension: Secondary | ICD-10-CM | POA: Diagnosis not present

## 2017-06-04 DIAGNOSIS — R4701 Aphasia: Secondary | ICD-10-CM | POA: Diagnosis not present

## 2017-07-21 ENCOUNTER — Institutional Professional Consult (permissible substitution): Payer: Self-pay | Admitting: Neurology

## 2017-07-22 ENCOUNTER — Institutional Professional Consult (permissible substitution): Payer: Self-pay | Admitting: Neurology

## 2017-08-18 DIAGNOSIS — E119 Type 2 diabetes mellitus without complications: Secondary | ICD-10-CM | POA: Diagnosis not present

## 2017-08-18 DIAGNOSIS — Z1389 Encounter for screening for other disorder: Secondary | ICD-10-CM | POA: Diagnosis not present

## 2017-08-18 DIAGNOSIS — I10 Essential (primary) hypertension: Secondary | ICD-10-CM | POA: Diagnosis not present

## 2017-08-18 DIAGNOSIS — Z23 Encounter for immunization: Secondary | ICD-10-CM | POA: Diagnosis not present

## 2017-09-29 DIAGNOSIS — I10 Essential (primary) hypertension: Secondary | ICD-10-CM | POA: Diagnosis not present

## 2017-10-04 ENCOUNTER — Encounter: Payer: Self-pay | Admitting: Neurology

## 2017-10-04 ENCOUNTER — Ambulatory Visit: Payer: Medicare PPO | Admitting: Neurology

## 2017-10-04 VITALS — BP 158/91 | HR 83 | Ht 69.25 in | Wt 247.0 lb

## 2017-10-04 DIAGNOSIS — G35 Multiple sclerosis: Secondary | ICD-10-CM

## 2017-10-04 DIAGNOSIS — R269 Unspecified abnormalities of gait and mobility: Secondary | ICD-10-CM | POA: Diagnosis not present

## 2017-10-04 NOTE — Progress Notes (Signed)
PATIENT: Thomas Nixon DOB: 03/04/1971  Chief Complaint  Patient presents with  . Multiple Sclerosis/Stroke    He was last seen in 07/2014 for MS and lost to follow up.  Reports worsening gait, stiffness and speech difficulty.  Past history of strokes.  Marland Kitchen. PCP    Georgann HousekeeperHusain, Karrar, MD     HISTORICAL  Thomas SimpersJames E Nixon  is a 46 years old male, seen in refer by  his primary care doctor Georgann HousekeeperHusain, Karrar for evaluation of gait abnormality, initial evaluation was on November twelfth 2018.  I reviewed and summarized referring note, he has history of hypertension, hyperlipidemia, diabetes, I saw him in 2011, lost follow-up, saw him again 2015, then lost follow-up again,  He had multiple strokelike episode in the past, started since his age twenties, he was [facility] at different medical facility, [facility], [facility], [facility].  I have reviewed [facility], with a diagnosis of acute demyelinating lesions, affecting both left, and right side of the brain, with severe left visual field cut, left neglect.  VEP was normal.   Lab showed normal or negative RPR, HIV, B12, folate acid, Lyme titer, ANA, TSH, protein electrophoresis, mild elevated CPK  [facility]: In 2014 has demonstrated extensive periventricular white matter disease, evidence of previous right parietal biopsy, hemosiderin deposit.  MRA of the brain was normal. ultrasound of carotid artery showed no significant large vessel disease.  [facility], dated August 2009 by neurologist Dr. Lillia DallasJames Matthew:  Laboratory evaluation,normaal fasting lipid profile,LDL was 53,  negative HIV, RPR,VDRL,  Athena Notch-3 Gene Mutation negative. norml CBC, INR,, CMP, other than mildly elevated Glu 128, A1C 6.4, TSH 1.5.  TTE: normal.  There were also mention of brain biopsy with demyelinating features, he did have improvement with steroid treatment, biopsy [facility]  [facility].  Over the past few years, he complains of slow worsening gait difficulty, slurred speech, he denies visual trouble, he denies bowel and bladder incontinence.  [facility], he has evidence of right lumbar radiculopathy, with MRI notable for bilateral L3 pars defect, 10 mm spondylolisthesis at L3 and 4, increased on flexion radiograph, the supported by positive EMG findings, he had lumbar decompression surgery by Dr. Ottis Stainomanski in 2015, which has helped his low back pain,   MRI lumbar in January 2017, status post L3-4 fusion, no significant canal foraminal stenosis  MRI of brain in September 2015, confluent periventricular and subcortical T2 hyperintensity, hemosiderin deposit in the right frontal cortical and bilateral parietal periventricular region, consistent with chronic intracerebral hemorrhage. Right frontal craniotomy and biopsy sequelae noted, compared to MRI in 2010, there is mild progression of white matter disease.  MRI cervical spine, mild degenerative changes, there is no evidence of intrinsic spinal cord disease  He lives with his wife, take care of the house chore, he noted mild increased gait abnormality, slurred speech, mild worsening memory loss  REVIEW OF SYSTEMS: Full 14 system review of systems performed and notable only for as above  ALLERGIES: Allergies  Allergen Reactions  . Pork-Derived Products     DIET RESTRICTIONS    HOME MEDICATIONS: Current Outpatient Medications  Medication Sig Dispense Refill  . amLODipine (NORVASC) 10 MG tablet Take 10 mg daily by mouth.  3  . atorvastatin (LIPITOR) 40 MG tablet Take 1 tablet (40 mg total) by mouth at bedtime. 30 tablet 1  . clopidogrel (PLAVIX) 75 MG  tablet Take 75 mg daily by mouth.  1  . hydrochlorothiazide (HYDRODIURIL) 25 MG tablet Take 25 mg daily by mouth.  11  . irbesartan (AVAPRO) 300 MG tablet Take 300 mg daily by mouth.  11  . LYRICA 300 MG capsule Take  300 mg 2 (two) times daily by mouth.  2  . metFORMIN (GLUCOPHAGE) 500 MG tablet Take 500 mg by mouth 2 (two) times daily with a meal.    . traMADol (ULTRAM) 50 MG tablet Take 50 mg 2 (two) times daily by mouth.  3   No current facility-administered medications for this visit.     PAST MEDICAL HISTORY: Past Medical History:  Diagnosis Date  . CVA (cerebral infarction)   . Diabetes mellitus without complication (HCC)    Type 2  . Encounter for long-term (current) use of other medications   . Headache(784.0)   . Hypertension   . MS (multiple sclerosis) (HCC)    possible  . Neuropathy   . Other and unspecified hyperlipidemia   . Stroke (HCC)    5 strokes  . Thoracic or lumbosacral neuritis or radiculitis, unspecified   . Unspecified late effects of cerebrovascular disease   . Unspecified sleep apnea    does not use cpap    PAST SURGICAL HISTORY: Past Surgical History:  Procedure Laterality Date  . BRAIN BIOPSY      FAMILY HISTORY: Family History  Problem Relation Age of Onset  . High blood pressure Mother   . Diabetes Mother   . High blood pressure Father   . Stroke Maternal Grandmother   . Stroke Paternal Grandmother     SOCIAL HISTORY:  Social History   Socioeconomic History  . Marital status: Married    Spouse name: [name]  . Number of children: 3  . Years of education: college  . Highest education level: Not on file  Social Needs  . Financial resource strain: Not on file  . Food insecurity - worry: Not on file  . Food insecurity - inability: Not on file  . Transportation needs - medical: Not on file  . Transportation needs - non-medical: Not on file  Occupational History    Comment: Disabled  Tobacco Use  . Smoking status: Light Tobacco Smoker    Types: Cigars  . Smokeless tobacco: Never Used  Substance and Sexual Activity  . Alcohol use: Yes    Comment: Once per month.  . Drug use: No  . Sexual activity: Not on file  Other Topics Concern  .  Not on file  Social History Narrative   Patient is married and lives at home with his wife [name] .   Disabled.   Education [employer].   Right handed.   1 cup caffeine per day.     PHYSICAL EXAM   Vitals:   10/04/17 0841  BP: (!) 158/91  Pulse: 83  Weight: 247 lb (112 kg)  Height: 5' 9.25" (1.759 m)    Not recorded      Body mass index is 36.21 kg/m.  PHYSICAL EXAMNIATION:  Gen: NAD, conversant, well nourised, obese, well groomed                     Cardiovascular: Regular rate rhythm, no peripheral edema, warm, nontender. Eyes: Conjunctivae clear without exudates or hemorrhage Neck: Supple, no carotid bruits. Pulmonary: Clear to auscultation bilaterally   NEUROLOGICAL EXAM:  MENTAL STATUS: Speech:    Speech is normal; fluent and spontaneous with normal comprehension.  Cognition:     Orientation to time, place and person     Normal recent and remote memory     Normal Attention span and concentration     Normal Language, naming, repeating,spontaneous speech     Fund of knowledge   CRANIAL NERVES: CN II: Visual fields are full to confrontation. Fundoscopic exam is normal with sharp discs and no vascular changes. Pupils are round equal and briskly reactive to light. CN III, IV, VI: extraocular movement are normal. No ptosis. CN V: Facial sensation is intact to pinprick in all 3 divisions bilaterally. Corneal responses are intact.  CN VII: Face is symmetric with normal eye closure and smile. CN VIII: Hearing is normal to rubbing fingers CN IX, X: Palate elevates symmetrically. Phonation is normal. CN XI: Head turning and shoulder shrug are intact CN XII: Tongue is midline with normal movements and no atrophy.  MOTOR: He has mild fixation of right upper extremity power rapid rotating movement, mild right hip flexion ankle dorsiflexion weakness  REFLEXES: Reflexes are 2+ and symmetric at the biceps, triceps, knees, and ankles. Plantar responses are  flexor.  SENSORY: Intact to light touch, pinprick, positional sensation and vibratory sensation are intact in fingers and toes.  COORDINATION: Rapid alternating movements and fine finger movements are intact. There is no dysmetria on finger-to-nose and heel-knee-shin.    GAIT/STANCE: . He needs push up to get up from seated position, wide based, cautious, dragging right leg Romberg is absent.   DIAGNOSTIC DATA (LABS, IMAGING, TESTING) - I reviewed patient records, labs, notes, testing and imaging myself where available.   ASSESSMENT AND PLAN  Thomas Nixon is a 46 y.o. male    Gait abnormality Relapsing remitting multiple sclerosis  MRI of the brain with and without contrast  Laboratory evaluations  Also introduced him to national society multiple sclerosis website, and treatment options, Gilenya, Tecfidera, Tysabri, Ocrelizumab  Differentiation diagnosis also include CADSIL, mitochondrial disease, adrenal leukodystrophy,  (VERY LONG CHAIN FATTY ACID, LACTATE,   Levert Feinstein, M.D. Ph.D.  [facility] [address], [address] [place], [place] [address] Ph: [phone] Fax: [phone]  [id number], Jerelyn Scott, MD

## 2017-10-06 ENCOUNTER — Encounter: Payer: Self-pay | Admitting: Neurology

## 2017-10-06 ENCOUNTER — Telehealth: Payer: Self-pay | Admitting: Neurology

## 2017-10-06 LAB — COMPREHENSIVE METABOLIC PANEL
ALT: 25 IU/L (ref 0–44)
AST: 23 IU/L (ref 0–40)
Albumin/Globulin Ratio: 1.6 (ref 1.2–2.2)
Albumin: 4.5 g/dL (ref 3.5–5.5)
Alkaline Phosphatase: 43 IU/L (ref 39–117)
BUN/Creatinine Ratio: 21 — ABNORMAL HIGH (ref 9–20)
BUN: 21 mg/dL (ref 6–24)
Bilirubin Total: 0.5 mg/dL (ref 0.0–1.2)
CO2: 26 mmol/L (ref 20–29)
Calcium: 9 mg/dL (ref 8.7–10.2)
Chloride: 101 mmol/L (ref 96–106)
Creatinine, Ser: 1.02 mg/dL (ref 0.76–1.27)
GFR calc Af Amer: 103 mL/min/{1.73_m2} (ref >59)
GFR calc non Af Amer: 89 mL/min/{1.73_m2} (ref >59)
Globulin, Total: 2.8 g/dL (ref 1.5–4.5)
Glucose: 133 mg/dL — ABNORMAL HIGH (ref 65–99)
Potassium: 3.8 mmol/L (ref 3.5–5.2)
Sodium: 144 mmol/L (ref 134–144)

## 2017-10-06 LAB — CBC WITH DIFFERENTIAL/PLATELET
Basophils Absolute: 0 10*3/uL (ref 0.0–0.2)
Basos: 0 %
EOS (ABSOLUTE): 0 10*3/uL (ref 0.0–0.4)
Eos: 0 %
Hematocrit: 41.9 % (ref 37.5–51.0)
Hemoglobin: 14.2 g/dL (ref 13.0–17.7)
Immature Grans (Abs): 0 10*3/uL (ref 0.0–0.1)
Immature Granulocytes: 0 %
Lymphocytes Absolute: 1.3 10*3/uL (ref 0.7–3.1)
Lymphs: 9 %
MCH: 27.9 pg (ref 26.6–33.0)
MCHC: 33.9 g/dL (ref 31.5–35.7)
MCV: 82 fL (ref 79–97)
Monocytes Absolute: 0.7 10*3/uL (ref 0.1–0.9)
Monocytes: 5 %
Neutrophils Absolute: 12 10*3/uL — ABNORMAL HIGH (ref 1.4–7.0)
Neutrophils: 86 %
Platelets: 243 10*3/uL (ref 150–379)
RBC: 5.09 x10E6/uL (ref 4.14–5.80)
RDW: 14.6 % (ref 12.3–15.4)
WBC: 14 10*3/uL — ABNORMAL HIGH (ref 3.4–10.8)

## 2017-10-06 LAB — ANA W/REFLEX: Anti Nuclear Antibody(ANA): NEGATIVE

## 2017-10-06 LAB — SEDIMENTATION RATE: Sed Rate: 4 mm/hr (ref 0–15)

## 2017-10-06 LAB — VARICELLA ZOSTER ANTIBODY, IGG: Varicella zoster IgG: 3426 index (ref >165)

## 2017-10-06 LAB — IMMUNOFIXATION ELECTROPHORESIS
IgA/Immunoglobulin A, Serum: 315 mg/dL (ref 90–386)
IgG (Immunoglobin G), Serum: 1139 mg/dL (ref 700–1600)
IgM (Immunoglobulin M), Srm: 45 mg/dL (ref 20–172)
Total Protein: 7.3 g/dL (ref 6.0–8.5)

## 2017-10-06 LAB — RPR: RPR Ser Ql: NONREACTIVE

## 2017-10-06 LAB — VITAMIN D 25 HYDROXY (VIT D DEFICIENCY, FRACTURES): Vit D, 25-Hydroxy: 25.1 ng/mL — ABNORMAL LOW (ref 30.0–100.0)

## 2017-10-06 LAB — COPPER, SERUM: Copper: 121 ug/dL (ref 72–166)

## 2017-10-06 LAB — HIV ANTIBODY (ROUTINE TESTING W REFLEX): HIV Screen 4th Generation wRfx: NONREACTIVE

## 2017-10-06 LAB — C-REACTIVE PROTEIN: CRP: 13.1 mg/L — ABNORMAL HIGH (ref 0.0–4.9)

## 2017-10-06 LAB — VITAMIN B12: Vitamin B-12: 619 pg/mL (ref 232–1245)

## 2017-10-06 NOTE — Telephone Encounter (Signed)
Laboratory evaluation showed elevated WBC 14, with elevated neutrophil absolute, asked patient if he has been having signs of infection, such as upper respiratory, UTI,  May consider repeat laboratory evaluation at next follow-up visit  vitamin D level was mildly decreased 25, he would benefit vitamin D3 supplements 1000 units daily,

## 2017-10-07 NOTE — Telephone Encounter (Signed)
Spoke to patient - aware of lab results.  He is agreeable to start a vitamin D3 supplement.  Says the day his labs were drawn, he was sick with nausea and vomiting. Symptoms only lasted one day and he is feeling normal now.  He denies any other signs of infection.

## 2017-10-11 ENCOUNTER — Telehealth: Payer: Self-pay | Admitting: *Deleted

## 2017-10-11 NOTE — Telephone Encounter (Signed)
Labs collected 10/04/17:  JCV 2.44 (H) positive  Documentation provided to Dr. Terrace ArabiaYan for review and will be sent to scanning.

## 2017-10-22 ENCOUNTER — Ambulatory Visit
Admission: RE | Admit: 2017-10-22 | Discharge: 2017-10-22 | Disposition: A | Discharge status: Home / Self Care | Payer: Medicare PPO | Source: Ambulatory Visit | Attending: Neurology | Admitting: Neurology

## 2017-10-22 DIAGNOSIS — G35 Multiple sclerosis: Secondary | ICD-10-CM | POA: Diagnosis not present

## 2017-10-22 MED ORDER — GADOBENATE DIMEGLUMINE 529 MG/ML IV SOLN
20.0000 mL | Freq: Once | INTRAVENOUS | Status: AC | PRN
  Administered 2017-10-22: 20 mL via INTRAVENOUS

## 2017-10-25 ENCOUNTER — Telehealth: Payer: Self-pay | Admitting: Neurology

## 2017-10-25 NOTE — Telephone Encounter (Signed)
Please call patient, MRI of the brain with and without contrast showed evidence of extensive lesions in the brain, overall mild progression of generalized atrophy in compared to MRI in 2015,  I will review MRIs with him at next follow-up visit  IMPRESSION:  This MRI of the brain with and without contrast shows the following: 1.    Confluent white matter T2/FLAIR hyperintense signal in the right greater than left hemispheres.   This is a nonspecific finding and could be due to either inflammatory or genetic demyelination or chronic ischemic changes.    Hadn't is essentially unchanged when compared to the 08/08/2014 MRI. 2.    There is chronic hemosiderin deposition in the right frontal lobe and left parietal lobe, unchanged when compared to the 2015 MRI. 3.    Mild generalized cortical atrophy that has progressed when compared to the 2015 MRI. 4.    There is a normal enhancement pattern of there are no acute findings.

## 2017-10-25 NOTE — Telephone Encounter (Signed)
Spoke to patient - he is aware of results and will keep his pending following up appt.

## 2017-10-28 DIAGNOSIS — I10 Essential (primary) hypertension: Secondary | ICD-10-CM | POA: Diagnosis not present

## 2017-12-09 ENCOUNTER — Encounter: Payer: Self-pay | Admitting: Neurology

## 2017-12-09 ENCOUNTER — Ambulatory Visit: Payer: Medicare PPO | Admitting: Neurology

## 2017-12-09 VITALS — BP 150/93 | HR 76 | Ht 69.0 in | Wt 250.0 lb

## 2017-12-09 DIAGNOSIS — G35 Multiple sclerosis: Secondary | ICD-10-CM

## 2017-12-09 NOTE — Progress Notes (Signed)
PATIENT: Thomas Nixon DOB: 08/15/1971  Chief Complaint  Patient presents with  . Follow-up    pt is here wth wife for follow up. pt feels like things are going well since last here in nov     HISTORICAL  Thomas Nixon  is a 47 years old male, seen in refer by  his primary care doctor Georgann Housekeeper for evaluation of gait abnormality, initial evaluation was on November twelfth 2018.  I reviewed and summarized referring note, he has history of hypertension, hyperlipidemia, diabetes, I saw him in 2011, lost follow-up, saw him again 2015, then lost follow-up again,  He had multiple strokelike episode in the past, started since his age twenties, he was treated at different medical facility, including [facility], [facility], [facility].  I have reviewed records from [facility] in [facility], with a diagnosis of acute demyelinating lesions, affecting both left, and right side of the brain, with severe left visual field cut, left neglect.  VEP was normal.   Lab showed normal or negative RPR, HIV, B12, folate acid, Lyme titer, ANA, TSH, protein electrophoresis, mild elevated CPK  MRI at [facility]: In 2014 has demonstrated extensive periventricular white matter disease, evidence of previous right parietal biopsy, hemosiderin deposit.  MRA of the brain was normal. ultrasound of carotid artery showed no significant large vessel disease.  Records from [facility], dated August 2009 by neurologist Dr. Lillia Dallas:  Laboratory evaluation,normaal fasting lipid profile,LDL was 53,  negative HIV, RPR,VDRL,  Athena Notch-3 Gene Mutation negative. norml CBC, INR,, CMP, other than mildly elevated Glu 128, A1C 6.4, TSH 1.5.  TTE: normal.  There were also mention of brain biopsy with demyelinating features, he did have improvement with steroid treatment, biopsy was read at [facility].  Over the past few years, he complains of slow worsening gait  difficulty, slurred speech, he denies visual trouble, he denies bowel and bladder incontinence.  Per records from [facility] orthopedic clinics, he has evidence of right lumbar radiculopathy, with MRI notable for bilateral L3 pars defect, 10 mm spondylolisthesis at L3 and 4, increased on flexion radiograph, the supported by positive EMG findings, he had lumbar decompression surgery by Dr. Ottis Stain in 2015, which has helped his low back pain,   MRI lumbar in January 2017, status post L3-4 fusion, no significant canal foraminal stenosis  MRI of brain in September 2015, confluent periventricular and subcortical T2 hyperintensity, hemosiderin deposit in the right frontal cortical and bilateral parietal periventricular region, consistent with chronic intracerebral hemorrhage. Right frontal craniotomy and biopsy sequelae noted, compared to MRI in 2010, there is mild progression of white matter disease.  MRI cervical spine, mild degenerative changes, there is no evidence of intrinsic spinal cord disease  He lives with his wife, take care of the house chore, he noted mild increased gait abnormality, slurred speech, mild worsening memory loss  UPDATE Dec 09 2017: He is accompanied by his wife at today's clinical visit, he continue complains of mild gait abnormality, memory loss, we were able to review MRI of the brain with and without contrast in November 2018, there was evidence of confluent white matter T2/FLAIR hyperintensity signal in the right greater than left hemisphere, possibility include genetic demyelinating disease, inflammatory demyelinating disease,  But based on previous brain biopsy in 2009, there was demyelinating features, he has significant symptom improvement with steroid treatment, suggestive of inflammatory component, he never received any long-term immunomodulation therapy,  I will refer him to  lumbar puncture, he has missed multiple scheduled appointment in the past, we decided to  proceed with ocrelizumab if lumbar puncture showed typical findings of elevated oligoclonal banding consistent with MS,  REVIEW OF SYSTEMS: Full 14 system review of systems performed and notable only for as above  ALLERGIES: Allergies  Allergen Reactions  . Pork-Derived Products     DIET RESTRICTIONS    HOME MEDICATIONS: Current Outpatient Medications  Medication Sig Dispense Refill  . amLODipine (NORVASC) 10 MG tablet Take 10 mg daily by mouth.  3  . atorvastatin (LIPITOR) 40 MG tablet Take 1 tablet (40 mg total) by mouth at bedtime. 30 tablet 1  . clopidogrel (PLAVIX) 75 MG tablet Take 75 mg daily by mouth.  1  . hydrochlorothiazide (HYDRODIURIL) 25 MG tablet Take 25 mg daily by mouth.  11  . irbesartan (AVAPRO) 300 MG tablet Take 300 mg daily by mouth.  11  . LYRICA 300 MG capsule Take 300 mg 2 (two) times daily by mouth.  2  . metFORMIN (GLUCOPHAGE) 500 MG tablet Take 500 mg by mouth 2 (two) times daily with a meal.    . traMADol (ULTRAM) 50 MG tablet Take 50 mg 2 (two) times daily by mouth.  3   No current facility-administered medications for this visit.     PAST MEDICAL HISTORY: Past Medical History:  Diagnosis Date  . CVA (cerebral infarction)   . Diabetes mellitus without complication (HCC)    Type 2  . Encounter for long-term (current) use of other medications   . Headache(784.0)   . Hypertension   . MS (multiple sclerosis) (HCC)    possible  . Neuropathy   . Other and unspecified hyperlipidemia   . Stroke (HCC)    5 strokes  . Thoracic or lumbosacral neuritis or radiculitis, unspecified   . Unspecified late effects of cerebrovascular disease   . Unspecified sleep apnea    does not use cpap    PAST SURGICAL HISTORY: Past Surgical History:  Procedure Laterality Date  . ANTERIOR LAT LUMBAR FUSION N/A 08/30/2014   Procedure: ANTERIOR LATERAL LUMBAR FUSION 1 LEVEL;  Surgeon: Emilee Hero, MD;  Location: [facility] OR;  Service: Orthopedics;  Laterality: N/A;   Lumbar 3-4 lateral interbody fusion with instrumentation and allograft  . BRAIN BIOPSY      FAMILY HISTORY: Family History  Problem Relation Age of Onset  . High blood pressure Mother   . Diabetes Mother   . High blood pressure Father   . Stroke Maternal Grandmother   . Stroke Paternal Grandmother     SOCIAL HISTORY:  Social History   Socioeconomic History  . Marital status: Married    Spouse name: [name]  . Number of children: 3  . Years of education: college  . Highest education level: Not on file  Social Needs  . Financial resource strain: Not on file  . Food insecurity - worry: Not on file  . Food insecurity - inability: Not on file  . Transportation needs - medical: Not on file  . Transportation needs - non-medical: Not on file  Occupational History    Comment: Disabled  Tobacco Use  . Smoking status: Light Tobacco Smoker    Types: Cigars  . Smokeless tobacco: Never Used  Substance and Sexual Activity  . Alcohol use: Yes    Comment: Once per month.  . Drug use: No  . Sexual activity: Not on file  Other Topics Concern  . Not on file  Social History  Narrative   Patient is married and lives at home with his wife [name] .   Disabled.   Education [employer].   Right handed.   1 cup caffeine per day.     PHYSICAL EXAM   Vitals:   12/09/17 0728  BP: (!) 150/93  Pulse: 76  Weight: 250 lb (113.4 kg)  Height: 5\' 9"  (1.753 m)    Not recorded      Body mass index is 36.92 kg/m.  PHYSICAL EXAMNIATION:  Gen: NAD, conversant, well nourised, obese, well groomed                     Cardiovascular: Regular rate rhythm, no peripheral edema, warm, nontender. Eyes: Conjunctivae clear without exudates or hemorrhage Neck: Supple, no carotid bruits. Pulmonary: Clear to auscultation bilaterally   NEUROLOGICAL EXAM:  MENTAL STATUS: Speech:    Speech is normal; fluent and spontaneous with normal comprehension.  Cognition:     Orientation to time, place and  person     Normal recent and remote memory     Normal Attention span and concentration     Normal Language, naming, repeating,spontaneous speech     Fund of knowledge   CRANIAL NERVES: CN II: Visual fields are full to confrontation. Fundoscopic exam is normal with sharp discs and no vascular changes. Pupils are round equal and briskly reactive to light. CN III, IV, VI: extraocular movement are normal. No ptosis. CN V: Facial sensation is intact to pinprick in all 3 divisions bilaterally. Corneal responses are intact.  CN VII: Face is symmetric with normal eye closure and smile. CN VIII: Hearing is normal to rubbing fingers CN IX, X: Palate elevates symmetrically. Phonation is normal. CN XI: Head turning and shoulder shrug are intact CN XII: Tongue is midline with normal movements and no atrophy.  MOTOR: He has mild fixation of right upper extremity power rapid rotating movement, mild right hip flexion ankle dorsiflexion weakness  REFLEXES: Reflexes are 2+ and symmetric at the biceps, triceps, knees, and ankles. Plantar responses are flexor.  SENSORY: Intact to light touch, pinprick, positional sensation and vibratory sensation are intact in fingers and toes.  COORDINATION: Rapid alternating movements and fine finger movements are intact. There is no dysmetria on finger-to-nose and heel-knee-shin.    GAIT/STANCE: . He needs push up to get up from seated position, wide based, cautious, dragging right leg Romberg is absent.   DIAGNOSTIC DATA (LABS, IMAGING, TESTING) - I reviewed patient records, labs, notes, testing and imaging myself where available.   ASSESSMENT AND PLAN  Thomas Nixon is a 47 y.o. male    Gait abnormality Relapsing remitting multiple sclerosis  MRI of the brain with and without contrast consistent with demyelinating disease, inflammatory versus traumatic,  Lumbar puncture  Laboratory evaluations  Potential candidate for ocrelizumab  Differentiation  diagnosis also include CADSIL, mitochondrial disease, adrenal leukodystrophy,  (VERY LONG CHAIN FATTY ACID, LACTATE,   Levert Feinstein, M.D. Ph.D.  [facility] [address], [address] [place], [place] [address] Ph: [phone] Fax: [phone]  [id number], Jerelyn Scott, MD

## 2017-12-10 ENCOUNTER — Other Ambulatory Visit: Payer: Self-pay | Admitting: Neurology

## 2017-12-10 ENCOUNTER — Telehealth: Payer: Self-pay | Admitting: Neurology

## 2017-12-10 DIAGNOSIS — R9089 Other abnormal findings on diagnostic imaging of central nervous system: Secondary | ICD-10-CM

## 2017-12-10 NOTE — Progress Notes (Signed)
Please call patient, I have ordered extra lab, please ask him to have lab test in our office.

## 2017-12-10 NOTE — Telephone Encounter (Signed)
Please call patient I have ordered extra laboratory evaluations, he needs to come in to have laboratory drawn

## 2017-12-13 ENCOUNTER — Other Ambulatory Visit (INDEPENDENT_AMBULATORY_CARE_PROVIDER_SITE_OTHER): Payer: Medicare PPO

## 2017-12-13 DIAGNOSIS — R9089 Other abnormal findings on diagnostic imaging of central nervous system: Secondary | ICD-10-CM

## 2017-12-13 DIAGNOSIS — Z0289 Encounter for other administrative examinations: Secondary | ICD-10-CM

## 2017-12-13 LAB — COMPREHENSIVE METABOLIC PANEL
ALT: 24 IU/L (ref 0–44)
AST: 21 IU/L (ref 0–40)
Albumin/Globulin Ratio: 1.6 (ref 1.2–2.2)
Albumin: 4.5 g/dL (ref 3.5–5.5)
Alkaline Phosphatase: 38 IU/L — ABNORMAL LOW (ref 39–117)
BUN/Creatinine Ratio: 18 (ref 9–20)
BUN: 21 mg/dL (ref 6–24)
Bilirubin Total: 0.4 mg/dL (ref 0.0–1.2)
CO2: 24 mmol/L (ref 20–29)
Calcium: 9.3 mg/dL (ref 8.7–10.2)
Chloride: 103 mmol/L (ref 96–106)
Creatinine, Ser: 1.19 mg/dL (ref 0.76–1.27)
GFR calc Af Amer: 85 mL/min/{1.73_m2} (ref >59)
GFR calc non Af Amer: 74 mL/min/{1.73_m2} (ref >59)
Globulin, Total: 2.8 g/dL (ref 1.5–4.5)
Glucose: 125 mg/dL — ABNORMAL HIGH (ref 65–99)
Potassium: 4.7 mmol/L (ref 3.5–5.2)
Sodium: 141 mmol/L (ref 134–144)
Total Protein: 7.3 g/dL (ref 6.0–8.5)

## 2017-12-13 LAB — CBC WITH DIFFERENTIAL/PLATELET
Basophils Absolute: 0 10*3/uL (ref 0.0–0.2)
Basos: 0 %
EOS (ABSOLUTE): 0.1 10*3/uL (ref 0.0–0.4)
Eos: 1 %
Hematocrit: 42.8 % (ref 37.5–51.0)
Hemoglobin: 14.5 g/dL (ref 13.0–17.7)
Immature Grans (Abs): 0 10*3/uL (ref 0.0–0.1)
Immature Granulocytes: 0 %
Lymphocytes Absolute: 1.7 10*3/uL (ref 0.7–3.1)
Lymphs: 27 %
MCH: 28.3 pg (ref 26.6–33.0)
MCHC: 33.9 g/dL (ref 31.5–35.7)
MCV: 83 fL (ref 79–97)
Monocytes Absolute: 0.4 10*3/uL (ref 0.1–0.9)
Monocytes: 6 %
Neutrophils Absolute: 4 10*3/uL (ref 1.4–7.0)
Neutrophils: 66 %
Platelets: 263 10*3/uL (ref 150–379)
RBC: 5.13 x10E6/uL (ref 4.14–5.80)
RDW: 14.4 % (ref 12.3–15.4)
WBC: 6.1 10*3/uL (ref 3.4–10.8)

## 2017-12-13 LAB — QUANTIFERON-TB GOLD PLUS
QuantiFERON Mitogen Value: >10 IU/mL
QuantiFERON Nil Value: 0.04 IU/mL
QuantiFERON TB1 Ag Value: 0.03 IU/mL
QuantiFERON TB2 Ag Value: 0.03 IU/mL
QuantiFERON-TB Gold Plus: NEGATIVE

## 2017-12-13 LAB — HEPATITIS B SURFACE ANTIBODY,QUALITATIVE: Hep B Surface Ab, Qual: NONREACTIVE

## 2017-12-13 LAB — HEPATITIS B SURFACE ANTIGEN: Hepatitis B Surface Ag: NEGATIVE

## 2017-12-13 LAB — HEPATITIS C ANTIBODY: Hep C Virus Ab: <0.1 s/co ratio (ref 0.0–0.9)

## 2017-12-13 LAB — TSH: TSH: 1.1 u[IU]/mL (ref 0.450–4.500)

## 2017-12-13 LAB — HEPATITIS B CORE ANTIBODY, TOTAL: Hep B Core Total Ab: NEGATIVE

## 2017-12-13 NOTE — Telephone Encounter (Signed)
Spoke to patient and his wife.  He will come in today for his labs.

## 2017-12-16 ENCOUNTER — Telehealth: Payer: Self-pay | Admitting: *Deleted

## 2017-12-16 NOTE — Telephone Encounter (Signed)
Lab collected 12/09/17:  JCV 2.83 H

## 2017-12-20 ENCOUNTER — Inpatient Hospital Stay
Admission: RE | Admit: 2017-12-20 | Discharge: 2017-12-20 | Disposition: A | Discharge status: Home / Self Care | Payer: Medicare PPO | Source: Ambulatory Visit | Attending: Neurology | Admitting: Neurology

## 2017-12-22 ENCOUNTER — Telehealth: Payer: Self-pay | Admitting: *Deleted

## 2017-12-22 LAB — VERY LONG CHAIN FATTY ACID
C 26:1: 0.1
C22:0: 23.1
C22:0w9: 0.82
Hexacosanoic Acid,C26: 0.21
Phytanic Acid: 0.22 ug/mL
Pristanic Acid: 0.04 ug/mL
Ratio C24/C22: 0.96
Ratio C26/C22: 0.009
Tetracosanoic Acid, C24: 22.18

## 2017-12-22 LAB — ARYLSULFATASE A DEFICIENCY: Arylsulfatase A activity: 38.1 nmol/hr/mg prt (ref 25.0–90.0)

## 2017-12-22 NOTE — Telephone Encounter (Signed)
Labs collected on 12/09/17:  JCV 2.83 positive

## 2017-12-24 ENCOUNTER — Telehealth: Payer: Self-pay | Admitting: Neurology

## 2017-12-24 NOTE — Telephone Encounter (Signed)
Pt called he is not interested in having ocrevus infusion, he wanting to get injection 1/mth for MS. Please call to advise, pt is aware the clinic closes at noon today

## 2017-12-27 NOTE — Telephone Encounter (Signed)
Dr. Terrace Arabia just wanted to get the Ocrevus prior authorization process started because it has been taking weeks.  He has a pending appt for his LP on 12/30/17.  I spoke to his wife on HIPAA - she is aware that we will call to discuss results and also speak to him about treatment options at that time.  She was appreciative.

## 2017-12-30 ENCOUNTER — Other Ambulatory Visit: Payer: Medicare PPO

## 2018-01-06 ENCOUNTER — Ambulatory Visit
Admission: RE | Admit: 2018-01-06 | Discharge: 2018-01-06 | Disposition: A | Discharge status: Home / Self Care | Payer: Medicare PPO | Source: Ambulatory Visit | Attending: Neurology | Admitting: Neurology

## 2018-01-06 VITALS — BP 127/84 | HR 77

## 2018-01-06 DIAGNOSIS — R269 Unspecified abnormalities of gait and mobility: Secondary | ICD-10-CM | POA: Diagnosis not present

## 2018-01-06 DIAGNOSIS — G35 Multiple sclerosis: Secondary | ICD-10-CM

## 2018-01-06 LAB — GRAM STAIN
GRAM STAIN:: NONE SEEN
MICRO NUMBER:: 90199205
SPECIMEN QUALITY:: ADEQUATE

## 2018-01-06 LAB — ILLEGIBLE FAX NUMB

## 2018-01-06 NOTE — Discharge Instructions (Signed)

## 2018-01-06 NOTE — Progress Notes (Signed)
Patient states he has been off Plavix for at least the past five days.  Marianne Golightly, RN 

## 2018-01-10 ENCOUNTER — Telehealth: Payer: Self-pay | Admitting: Neurology

## 2018-01-10 NOTE — Telephone Encounter (Signed)
I did not see oligoclonal banding CSF report, please talk with the laboratory or [facility] to added it on

## 2018-01-11 NOTE — Telephone Encounter (Signed)
CSF sample sent to [facility].  I called to check on results and it is still pending.  They stated it can take six days.  The result will be faxed to Dr. Zannie Cove attention.

## 2018-01-12 LAB — CSF CELL COUNT WITH DIFFERENTIAL
RBC Count, CSF: 3 cells/uL (ref 0–10)
WBC, CSF: 0 cells/uL (ref 0–5)

## 2018-01-12 LAB — GLUCOSE, CSF: Glucose, CSF: 70 mg/dL (ref 40–80)

## 2018-01-12 LAB — MULTIPLE SCLEROSIS PANEL 2
Albumin Serum: 4.3 g/dL (ref 3.5–5.2)
Albumin, CSF: 27.7 mg/dL (ref 8.0–42.0)
CNS-IgG Synthesis Rate: 8.4 mg/24 h — ABNORMAL HIGH (ref <=3.3)
IgG (Immunoglobin G), Serum: 1250 mg/dL (ref 694–1618)
IgG Total CSF: 6.2 mg/dL (ref 0.8–7.7)
IgG-Index: 0.77 — ABNORMAL HIGH (ref <0.66)
Myelin Basic Protein: <2 mcg/L (ref 2.0–4.0)

## 2018-01-12 LAB — PROTEIN, CSF: Total Protein, CSF: 46 mg/dL — ABNORMAL HIGH (ref 15–45)

## 2018-01-12 LAB — ILLEGIBLE FAX NUMB

## 2018-01-12 LAB — VDRL, CSF: VDRL Quant, CSF: NONREACTIVE

## 2018-02-04 LAB — FUNGUS CULTURE W SMEAR
MICRO NUMBER:: 90199206
SMEAR:: NONE SEEN
SPECIMEN QUALITY:: ADEQUATE

## 2018-02-04 LAB — ILLEGIBLE FAX NUMB

## 2018-02-07 ENCOUNTER — Encounter: Payer: Self-pay | Admitting: Neurology

## 2018-02-07 ENCOUNTER — Ambulatory Visit: Payer: Medicare PPO | Admitting: Neurology

## 2018-02-07 VITALS — BP 138/88 | HR 64 | Ht 69.0 in | Wt 249.5 lb

## 2018-02-07 DIAGNOSIS — G35 Multiple sclerosis: Secondary | ICD-10-CM

## 2018-02-07 DIAGNOSIS — R269 Unspecified abnormalities of gait and mobility: Secondary | ICD-10-CM | POA: Diagnosis not present

## 2018-02-07 NOTE — Progress Notes (Addendum)
PATIENT: Thomas Nixon DOB: July 23, 1971  Chief Complaint  Patient presents with  . Multiple Sclerosis    He is here with his wife, Thomas Nixon, to discuss MS treatment options.     HISTORICAL  Thomas Nixon  is a 47 years old male, seen in refer by  his primary care doctor Georgann Housekeeper for evaluation of gait abnormality, initial evaluation was on November twelfth 2018.  I reviewed and summarized referring note, he has history of hypertension, hyperlipidemia, diabetes, I saw him in 2011, lost follow-up, saw him again 2015, then lost follow-up again,  He had multiple strokelike episode in the past, started since his age twenties, he was treated at different medical facility, including [facility], [facility], [facility].  I have reviewed records from [facility] in 2002, with a diagnosis of acute demyelinating lesions, affecting both left, and right side of the brain, with severe left visual field cut, left neglect.  VEP was normal.   Lab showed normal or negative RPR, HIV, B12, folate acid, Lyme titer, ANA, TSH, protein electrophoresis, mild elevated CPK  MRI at [facility]: In 2014 has demonstrated extensive periventricular white matter disease, evidence of previous right parietal biopsy, hemosiderin deposit.  MRA of the brain was normal. ultrasound of carotid artery showed no significant large vessel disease.  Records from [facility], dated August 2009 by neurologist Dr. Lillia Dallas:  Laboratory evaluation,normaal fasting lipid profile,LDL was 53,  negative HIV, RPR,VDRL,  Athena Notch-3 Gene Mutation negative. norml CBC, INR,, CMP, other than mildly elevated Glu 128, A1C 6.4, TSH 1.5.  TTE: normal.  There were also mention of brain biopsy with demyelinating features, he did have improvement with steroid treatment, biopsy was read at [facility] clinic.  Over the past few years, he complains of slow worsening gait difficulty, slurred speech,  he denies visual trouble, he denies bowel and bladder incontinence.  Per records from [facility] orthopedic clinics, he has evidence of right lumbar radiculopathy, with MRI notable for bilateral L3 pars defect, 10 mm spondylolisthesis at L3 and 4, increased on flexion radiograph, the supported by positive EMG findings, he had lumbar decompression surgery by Dr. Ottis Stain in 2015, which has helped his low back pain,   MRI lumbar in January 2017, status post L3-4 fusion, no significant canal foraminal stenosis  MRI of brain in September 2015, confluent periventricular and subcortical T2 hyperintensity, hemosiderin deposit in the right frontal cortical and bilateral parietal periventricular region, consistent with chronic intracerebral hemorrhage. Right frontal craniotomy and biopsy sequelae noted, compared to MRI in 2010, there is mild progression of white matter disease.  MRI cervical spine, mild degenerative changes, there is no evidence of intrinsic spinal cord disease  He lives with his wife, take care of the house chore, he noted mild increased gait abnormality, slurred speech, mild worsening memory loss  UPDATE Dec 09 2017: He is accompanied by his wife at today's clinical visit, he continue complains of mild gait abnormality, memory loss, we were able to review MRI of the brain with and without contrast in November 2018, there was evidence of confluent white matter T2/FLAIR hyperintensity signal in the right greater than left hemisphere, possibility include genetic demyelinating disease, inflammatory demyelinating disease,  But based on previous brain biopsy in 2009, there was demyelinating features, he has significant symptom improvement with steroid treatment, suggestive of inflammatory component, he never received any long-term immunomodulation therapy,  I will refer him to lumbar puncture, he has missed multiple scheduled  appointment in the past, we decided to proceed with ocrelizumab if  lumbar puncture showed typical findings of elevated oligoclonal banding consistent with MS,  Update February 07, 2018: He is accompanied by his wife at today's clinical visit, who has known him since 2009, Thomas Nixon did have gradual decline over the past 10 years, worsening memory loss, gait abnormality, most recent flareups was In 2014, he presented with left side stroke like event  JC virus titer 2.83 in Jan 2019, his insurance has limited coverage for ocrelizumab, he wants to explore other treatment options, Tysabri is not a good option due to high JC virus titer  Spinal fluid testing showed more than 5 oligoclonal banding,  Laboratory evaluation showed normal negative very long chain fatty acid, arylsufatase A activity.  REVIEW OF SYSTEMS: Full 14 system review of systems performed and notable only for as above  ALLERGIES: Allergies  Allergen Reactions  . Pork-Derived Products Other (See Comments)    DIET RESTRICTIONS    HOME MEDICATIONS: Current Outpatient Medications  Medication Sig Dispense Refill  . amLODipine (NORVASC) 10 MG tablet Take 10 mg daily by mouth.  3  . atorvastatin (LIPITOR) 40 MG tablet Take 1 tablet (40 mg total) by mouth at bedtime. 30 tablet 1  . clopidogrel (PLAVIX) 75 MG tablet Take 75 mg daily by mouth.  1  . hydrochlorothiazide (HYDRODIURIL) 25 MG tablet Take 25 mg daily by mouth.  11  . irbesartan (AVAPRO) 300 MG tablet Take 300 mg daily by mouth.  11  . LYRICA 300 MG capsule Take 300 mg 2 (two) times daily by mouth.  2  . metFORMIN (GLUCOPHAGE) 500 MG tablet Take 500 mg by mouth 2 (two) times daily with a meal.    . traMADol (ULTRAM) 50 MG tablet Take 50 mg 2 (two) times daily by mouth.  3   No current facility-administered medications for this visit.     PAST MEDICAL HISTORY: Past Medical History:  Diagnosis Date  . CVA (cerebral infarction)   . Diabetes mellitus without complication (HCC)    Type 2  . Encounter for long-term (current) use of  other medications   . Headache(784.0)   . Hypertension   . MS (multiple sclerosis) (HCC)    possible  . Neuropathy   . Other and unspecified hyperlipidemia   . Stroke (HCC)    5 strokes  . Thoracic or lumbosacral neuritis or radiculitis, unspecified   . Unspecified late effects of cerebrovascular disease   . Unspecified sleep apnea    does not use cpap    PAST SURGICAL HISTORY: Past Surgical History:  Procedure Laterality Date  . ANTERIOR LAT LUMBAR FUSION N/A 08/30/2014   Procedure: ANTERIOR LATERAL LUMBAR FUSION 1 LEVEL;  Surgeon: Emilee Hero, MD;  Location: [facility] OR;  Service: Orthopedics;  Laterality: N/A;  Lumbar 3-4 lateral interbody fusion with instrumentation and allograft  . BRAIN BIOPSY      FAMILY HISTORY: Family History  Problem Relation Age of Onset  . High blood pressure Mother   . Diabetes Mother   . High blood pressure Father   . Stroke Maternal Grandmother   . Stroke Paternal Grandmother     SOCIAL HISTORY:  Social History   Socioeconomic History  . Marital status: Married    Spouse name: Thomas Nixon  . Number of children: 3  . Years of education: college  . Highest education level: Not on file  Social Needs  . Financial resource strain: Not on file  .  Food insecurity - worry: Not on file  . Food insecurity - inability: Not on file  . Transportation needs - medical: Not on file  . Transportation needs - non-medical: Not on file  Occupational History    Comment: Disabled  Tobacco Use  . Smoking status: Light Tobacco Smoker    Types: Cigars  . Smokeless tobacco: Never Used  Substance and Sexual Activity  . Alcohol use: Yes    Comment: Once per month.  . Drug use: No  . Sexual activity: Not on file  Other Topics Concern  . Not on file  Social History Narrative   Patient is married and lives at home with his wife Thomas Nixon .   Disabled.   Education [employer].   Right handed.   1 cup caffeine per day.     PHYSICAL EXAM   Vitals:    02/07/18 1015  BP: 138/88  Pulse: 64  Weight: 249 lb 8 oz (113.2 kg)  Height: 5\' 9"  (1.753 m)    Not recorded      Body mass index is 36.84 kg/m.  PHYSICAL EXAMNIATION:  Gen: NAD, conversant, well nourised, obese, well groomed                     Cardiovascular: Regular rate rhythm, no peripheral edema, warm, nontender. Eyes: Conjunctivae clear without exudates or hemorrhage Neck: Supple, no carotid bruits. Pulmonary: Clear to auscultation bilaterally   NEUROLOGICAL EXAM:  MMSE - Mini Mental State Exam 02/07/2018 07/25/2014  Orientation to time 4 5  Orientation to Place 3 5  Registration 3 3  Attention/ Calculation 3 5  Recall 2 3  Language- name 2 objects 2 2  Language- repeat 1 1  Language- follow 3 step command 3 3  Language- read & follow direction 1 1  Write a sentence 1 1  Copy design 1 0  Total score 24 29     CRANIAL NERVES: CN II: Visual fields are full to confrontation. Fundoscopic exam is normal with sharp discs and no vascular changes. Pupils are round equal and briskly reactive to light. CN III, IV, VI: extraocular movement are normal. No ptosis. CN V: Facial sensation is intact to pinprick in all 3 divisions bilaterally. Corneal responses are intact.  CN VII: Face is symmetric with normal eye closure and smile. CN VIII: Hearing is normal to rubbing fingers CN IX, X: Palate elevates symmetrically. Phonation is normal. CN XI: Head turning and shoulder shrug are intact CN XII: Tongue is midline with normal movements and no atrophy.  MOTOR: He has mild fixation of right upper extremity power rapid rotating movement, mild right hip flexion ankle dorsiflexion weakness  REFLEXES: Reflexes are 2+ and symmetric at the biceps, triceps, knees, and ankles. Plantar responses are flexor.  SENSORY: Intact to light touch, pinprick, positional sensation and vibratory sensation are intact in fingers and toes.  COORDINATION: Rapid alternating movements and fine  finger movements are intact. There is no dysmetria on finger-to-nose and heel-knee-shin.    GAIT/STANCE: . He needs push up to get up from seated position, wide based, cautious, dragging right leg Romberg is absent.   DIAGNOSTIC DATA (LABS, IMAGING, TESTING) - I reviewed patient records, labs, notes, testing and imaging myself where available.   ASSESSMENT AND PLAN  Thomas Nixon is a 47 y.o. male    Gait abnormality  Refer him to physical therapy Relapsing remitting multiple sclerosis  MRI of the brain with and without contrast consistent with demyelinating disease,  CSF has more than 5 oligoclonal banding  JC virus titer was elevated 2.83, patient is not a good candidate for Tysabri  After extensive discussion with patient, we decided to proceed with Bubba Hales, M.D. Ph.D.  [facility] [address], [address] [place], [place] [address] Ph: [phone] Fax: [phone]  [id number], Jerelyn Scott, MD

## 2018-02-08 ENCOUNTER — Encounter: Payer: Self-pay | Admitting: *Deleted

## 2018-02-11 ENCOUNTER — Telehealth: Payer: Self-pay | Admitting: *Deleted

## 2018-02-11 NOTE — Telephone Encounter (Signed)
Submitted PA Tecfidera 120/240mg  on covermymeds. Key: [id number]. Waiting on determination.  Dx: G35, relapsing-remitting MS. No prior meds. Per Dr. Terrace Arabia last note: "JC virus titer was elevated 2.83, patient is not a good candidate for Tysabri".

## 2018-02-14 ENCOUNTER — Other Ambulatory Visit: Payer: Self-pay

## 2018-02-14 ENCOUNTER — Encounter: Payer: Self-pay | Admitting: Physical Therapy

## 2018-02-14 ENCOUNTER — Ambulatory Visit: Payer: Medicare PPO | Attending: Neurology | Admitting: Physical Therapy

## 2018-02-14 DIAGNOSIS — M6281 Muscle weakness (generalized): Secondary | ICD-10-CM

## 2018-02-14 DIAGNOSIS — R2689 Other abnormalities of gait and mobility: Secondary | ICD-10-CM

## 2018-02-14 DIAGNOSIS — R29818 Other symptoms and signs involving the nervous system: Secondary | ICD-10-CM | POA: Diagnosis not present

## 2018-02-14 DIAGNOSIS — R2681 Unsteadiness on feet: Secondary | ICD-10-CM

## 2018-02-14 NOTE — Telephone Encounter (Addendum)
PA Case: [id number].  Approved effective 02/13/2018 12:00:00 AM-02/13/2020 12:00:00 AM. Questions? Contact [phone].  Faxed notice of approval to [facility] pharmacy at [phone]. Received fax confirmation.

## 2018-02-14 NOTE — Therapy (Signed)
[facility] [address] [place], [place], [address] Phone: [phone]   Fax:  [phone]  Physical Therapy Evaluation  Patient Details  Name: Thomas Nixon MRN: [id number] Date of Birth: Dec 28, 1970 Referring Provider: Levert Feinstein, MD   Encounter Date: 02/14/2018  PT End of Session - 02/14/18 1025    Visit Number  1    Number of Visits  17    Date for PT Re-Evaluation  04/15/18    Authorization Type  [employer] MCR    Authorization Time Period  02/14/18 to 04/15/18    PT Start Time  0935    PT Stop Time  1015    PT Time Calculation (min)  40 min    Activity Tolerance  Patient tolerated treatment well    Behavior During Therapy  Anxious       Past Medical History:  Diagnosis Date  . CVA (cerebral infarction)   . Diabetes mellitus without complication (HCC)    Type 2  . Encounter for long-term (current) use of other medications   . Headache(784.0)   . Hypertension   . MS (multiple sclerosis) (HCC)    possible  . Neuropathy   . Other and unspecified hyperlipidemia   . Stroke (HCC)    5 strokes  . Thoracic or lumbosacral neuritis or radiculitis, unspecified   . Unspecified late effects of cerebrovascular disease   . Unspecified sleep apnea    does not use cpap    Past Surgical History:  Procedure Laterality Date  . ANTERIOR LAT LUMBAR FUSION N/A 08/30/2014   Procedure: ANTERIOR LATERAL LUMBAR FUSION 1 LEVEL;  Surgeon: Emilee Hero, MD;  Location: [facility] OR;  Service: Orthopedics;  Laterality: N/A;  Lumbar 3-4 lateral interbody fusion with instrumentation and allograft  . BRAIN BIOPSY      There were no vitals filed for this visit.   Subjective Assessment - 02/14/18 0939    Subjective  Dr. Terrace Arabia wanted me to come because I have MS. I think she wanted to look at how I walk. And to get stronger in my core. Patient is able to report his walking has gotten worse over the years and currently he feels his RLE  doesn't move as well as his left and that he takes short steps for someone his size.     Pertinent History  hypertension, hyperlipidemia, diabetes, back surgery 2015, CVA x 5 (left field cut-resolved), Craniotomy for brain biopsy,     Patient Stated Goals  Strengthen my core and walk straight up    Currently in Pain?  No/denies         [facility] PT Assessment - 02/14/18 0941      Assessment   Medical Diagnosis  MS, gait abnormality    Referring Provider  Levert Feinstein, MD    Onset Date/Surgical Date  -- testing for years; diagnosed MS 09/2017    Prior Therapy  OP Neuro rehab in the past      Precautions   Precautions  None      Restrictions   Weight Bearing Restrictions  No      Balance Screen   Has the patient fallen in the past 6 months  No    Has the patient had a decrease in activity level because of a fear of falling?   Yes    Is the patient reluctant to leave their home because of a fear of falling?   Yes      Home Environment  Living Environment  Private residence    Living Arrangements  Spouse/significant other;Children 4 children (9, 10, 8, 4)    Available Help at Discharge  Family;Available PRN/intermittently    Type of Home  House    Home Access  Level entry    Home Layout  Two level;Bed/bath upstairs    Alternate Level Stairs-Number of Steps  12    Alternate Level Stairs-Rails  Right    Home Equipment  Cane - single point;Walker - 2 wheels      Prior Function   Level of Independence  Independent    Vocation  On disability    Leisure  walking his dog 20-30 minutes each morning      Cognition   Overall Cognitive Status  Within Functional Limits for tasks assessed    Behaviors  Other (comment) anxious, startles easily      Observation/Other Assessments   Focus on Therapeutic Outcomes (FOTO)   NA      Sensation   Light Touch  Impaired by gross assessment sometimes in my feet    Proprioception  Appears Intact at ankle tested      Coordination   Gross Motor  Movements are Fluid and Coordinated  Yes    Fine Motor Movements are Fluid and Coordinated  Yes      Posture/Postural Control   Posture/Postural Control  Postural limitations    Postural Limitations  Forward head      Tone   Assessment Location  Right Lower Extremity;Left Lower Extremity      ROM / Strength   AROM / PROM / Strength  AROM;Strength      AROM   Overall AROM   Within functional limits for tasks performed      Strength   Overall Strength  Within functional limits for tasks performed      Transfers   Transfers  Sit to Stand;Stand to Sit    Sit to Stand  7: Independent    Five time sit to stand comments   12.09    Stand to Sit  6: Modified independent (Device/Increase time);With upper extremity assist;Uncontrolled descent      Ambulation/Gait   Ambulation/Gait  Yes    Ambulation/Gait Assistance  6: Modified independent (Device/Increase time)    Ambulation Distance (Feet)  80 Feet 75, 80    Assistive device  None    Gait Pattern  Step-through pattern;Decreased arm swing - right;Decreased step length - left;Decreased stance time - right;Decreased stride length;Decreased weight shift to right;Right foot flat;Left foot flat    Ambulation Surface  Level;Indoor    Gait velocity  32.8/9.5=3.45 ft/sec 3.72 ft/sec norm for male 63-49    Stairs  Yes    Stairs Assistance  6: Modified independent (Device/Increase time)    Stair Management Technique  One rail Left;Alternating pattern;Forwards    Number of Stairs  4    Height of Stairs  6      Standardized Balance Assessment   Standardized Balance Assessment  Timed Up and Go Test      Timed Up and Go Test   Normal TUG (seconds)  10.78    Cognitive TUG (seconds)  11.94 11.85 = 10% abpve 10.78      Functional Gait  Assessment   Gait assessed   Yes    Gait Level Surface  Walks 20 ft in less than 7 sec but greater than 5.5 sec, uses assistive device, slower speed, mild gait deviations, or deviates 6-10 in outside of the 12  in  walkway width. 6.4    Change in Gait Speed  Able to change speed, demonstrates mild gait deviations, deviates 6-10 in outside of the 12 in walkway width, or no gait deviations, unable to achieve a major change in velocity, or uses a change in velocity, or uses an assistive device.    Gait with Horizontal Head Turns  Performs head turns smoothly with slight change in gait velocity (eg, minor disruption to smooth gait path), deviates 6-10 in outside 12 in walkway width, or uses an assistive device.    Gait with Vertical Head Turns  Performs task with slight change in gait velocity (eg, minor disruption to smooth gait path), deviates 6 - 10 in outside 12 in walkway width or uses assistive device    Gait and Pivot Turn  Turns slowly, requires verbal cueing, or requires several small steps to catch balance following turn and stop    Step Over Obstacle  Is able to step over 2 stacked shoe boxes taped together (9 in total height) without changing gait speed. No evidence of imbalance.    Gait with Narrow Base of Support  Ambulates less than 4 steps heel to toe or cannot perform without assistance.    Gait with Eyes Closed  Walks 20 ft, slow speed, abnormal gait pattern, evidence for imbalance, deviates 10-15 in outside 12 in walkway width. Requires more than 9 sec to ambulate 20 ft.    Ambulating Backwards  Walks 20 ft, uses assistive device, slower speed, mild gait deviations, deviates 6-10 in outside 12 in walkway width.    Steps  Alternating feet, must use rail.    Total Score  17      RLE Tone   RLE Tone  Modified Ashworth      RLE Tone   Modified Ashworth Scale for Grading Hypertonia RLE  Slight increase in muscle tone, manifested by a catch, followed by minimal resistance throughout the remainder (less than half) of the ROM      LLE Tone   LLE Tone  Within Functional Limits                Objective measurements completed on examination: See above findings.              PT  Education - 02/14/18 1024    Education Details  results of assessment compared to norms for age and related to incr fall risk; incr to 2 walks per day with dog    Person(s) Educated  Patient    Methods  Explanation    Comprehension  Verbalized understanding       PT Short Term Goals - 02/14/18 1041      PT SHORT TERM GOAL #1   Title  Patient will be independent with HEP (Target all STGs 03/16/2018)    Time  4    Period  Weeks    Status  New    Target Date  03/16/18      PT SHORT TERM GOAL #2   Title  Patient will improve FGA to >=20/30 demonstrating progress towards lower fall risk.    Baseline  02/14/18  17/30    Time  4    Period  Weeks    Status  New      PT SHORT TERM GOAL #3   Title  Patient will decrease 5x sit to stand to <=10.0 sec to demonstrate improved bil LE strengthening.     Time  4    Period  Weeks    Status  New      PT SHORT TERM GOAL #4   Title  Patient will improve gait velocity to >=3.72 ft/sec (norm for age).    Time  4    Period  Weeks    Status  New        PT Long Term Goals - 02/14/18 1048      PT LONG TERM GOAL #1   Title  Patient will be independent with updated HEP and able to verbalize his plan for continued community-based activity to maintain his gains. (Target all LTGs 04/15/18)    Time  8    Period  Weeks    Status  New    Target Date  04/15/18      PT LONG TERM GOAL #2   Title  Patient will improve FGA to >= 23/30 to demonstrate lesser fall risk.     Time  8    Period  Weeks    Status  New      PT LONG TERM GOAL #3   Title  Patient will ambulate modified independent x1000 ft outdoor over level and unlevel surfaces for safety with playing with dog/children on unlevel ground.     Time  8    Period  Weeks    Status  New      PT LONG TERM GOAL #4   Title  Patient will perform 5x sit to stand <=8.5 seconds demonstrating improved bil LE strength.    Time  8    Period  Weeks    Status  New             Plan - 02/14/18  1030    Clinical Impression Statement  Patient referred to Physical Therapy by Dr. Levert Feinstein due to recent diagnosis of MS with gait abnormality. Patient's objective measures are all outside normal for a 47 year old male with some measures far enough off to indicate he is at increased risk for falling. (FGA 17/30; TUG cognitive >10% higher than TUG normal). Overall he appears motivated to improve as he has 4 adopted young children (ages 65-10). He can benefit from the PT interventions listed below to address the deficits listed below.     History and Personal Factors relevant to plan of care:  PMH-hypertension, hyperlipidemia, diabetes, back surgery 2015, CVA x 5, Craniotomy for brain biopsy, MS (diagnosed 09/2017)  Personal factors- decreased short-term memory (will provide written information to improve compliance with HEP); expected slow progression due to MS diagnosis    Clinical Presentation  Stable    Clinical Presentation due to:  has not experienced recent flare     Clinical Decision Making  Low    Rehab Potential  Good    Clinical Impairments Affecting Rehab Potential  decr short term memory    PT Frequency  2x / week    PT Duration  8 weeks    PT Treatment/Interventions  ADLs/Self Care Home Management;Gait training;Cognitive remediation;Neuromuscular re-education;Balance training;Therapeutic exercise;Therapeutic activities;Functional mobility training;Stair training;Patient/family education;Passive range of motion    PT Next Visit Plan  initiate HEP for Leg and core strengthening and balance; ask about type of exercise he enjoys for planning for continuation upon discharge    PT Home Exercise Plan  walking his dog 20-30 minutes 1x/day (asked him to incr to 2x/day)    Recommended Other Services  Nutritionist consult (pt inquiring about right foods to eat to also help him lose weight    Consulted  and Agree with Plan of Care  Patient       Patient will benefit from skilled therapeutic  intervention in order to improve the following deficits and impairments:  Abnormal gait, Decreased balance, Decreased cognition, Decreased mobility, Decreased strength, Impaired tone, Postural dysfunction, Obesity  Visit Diagnosis: Unsteadiness on feet - Plan: PT plan of care cert/re-cert  Other abnormalities of gait and mobility - Plan: PT plan of care cert/re-cert  Muscle weakness (generalized) - Plan: PT plan of care cert/re-cert  Other symptoms and signs involving the nervous system - Plan: PT plan of care cert/re-cert     Problem List Patient Active Problem List   Diagnosis Date Noted  . Gait abnormality 10/04/2017  . Radiculopathy 08/30/2014  . Multiple sclerosis (HCC) 08/10/2014  . Diabetes mellitus without complication (HCC)   . CVA (cerebral infarction)   . Thoracic or lumbosacral neuritis or radiculitis, unspecified   . Other and unspecified hyperlipidemia   . Encounter for long-term (current) use of other medications   . Unspecified late effects of cerebrovascular disease   . Unspecified sleep apnea   . 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 Dysarthria 06/01/2013  . Hypertension 06/01/2013    Zena Amos , PT 02/14/2018, 10:57 AM  [facility] [address] [place], [place], [address] Phone: [phone]   Fax:  [phone]  Name: Thomas Nixon MRN: [id number] Date of Birth: 21-Jan-1971

## 2018-02-16 ENCOUNTER — Encounter: Payer: Self-pay | Admitting: Physical Therapy

## 2018-02-16 ENCOUNTER — Ambulatory Visit: Payer: Medicare PPO | Admitting: Physical Therapy

## 2018-02-16 DIAGNOSIS — R29818 Other symptoms and signs involving the nervous system: Secondary | ICD-10-CM | POA: Diagnosis not present

## 2018-02-16 DIAGNOSIS — M6281 Muscle weakness (generalized): Secondary | ICD-10-CM

## 2018-02-16 DIAGNOSIS — R2689 Other abnormalities of gait and mobility: Secondary | ICD-10-CM | POA: Diagnosis not present

## 2018-02-16 DIAGNOSIS — R2681 Unsteadiness on feet: Secondary | ICD-10-CM | POA: Diagnosis not present

## 2018-02-16 NOTE — Patient Instructions (Signed)
  Bracing With Arm / Leg Raise (Quadruped)    On hands and knees tighten your stomach as if going to get punched (brace yourself). Alternating, lift arm to shoulder level and opposite leg to hip level. Hold for a count of 5. Then do the opposite arm and leg. Repeat __20_ times. Do _1__ times a day.   DEVELOPMENTAL POSITION: Tall Kneeling to Half Kneeling    Beside your coffee table or chair, start on both knees (tall kneeling position), shift weight to one side. Bring opposite leg forward, place foot flat on surface. Then return to tall kneeling. Repeat with other leg. __10_ reps per set, _1_ sets per day, __5_ days per week   Copyright  VHI. All rights reserved.   Functional Quadriceps: Sit to Stand    Sit on edge of chair, feet flat on floor. Stand upright, extending knees fully. DO NOT PRESS BACKS OF LEGS AGAINST THE CHAIR! Slowly lower to sitting "light as a feather" Repeat __10__ times per set. Do __2__ sets per session. Do __1__ sessions per day.  http://orth.exer.us/734   Copyright  VHI. All rights reserved.   Toe / Heel Raise    Stand at the counter with hands as light as possible. Gently rock back on heels and raise toes. Hold for 3 seconds. Then rock forward on toes and raise heels. Hold for 3 seconds. Repeat sequence __20__ times per session. Do ____ sessions per week.  Copyright  VHI. All rights reserved.   Plank    Support body on hands and feet. Keep hips in line with torso and arms straight under chest. Hold for __15__ seconds and count out loud to not hold your breath.  Repeat 4 times total  Copyright  VHI. All rights reserved.

## 2018-02-16 NOTE — Therapy (Signed)
[facility] [address] [place], [place], [address] Phone: [phone]   Fax:  [phone]  Physical Therapy Treatment  Patient Details  Name: Thomas Nixon MRN: [id number] Date of Birth: 1971/07/24 Referring Provider: Levert Feinstein, MD   Encounter Date: 02/16/2018  PT End of Session - 02/16/18 1023    Visit Number  2    Number of Visits  17    Date for PT Re-Evaluation  04/15/18    Authorization Type  [employer] MCR    Authorization Time Period  02/14/18 to 04/15/18    PT Start Time  0933    PT Stop Time  1017    PT Time Calculation (min)  44 min    Activity Tolerance  Patient tolerated treatment well    Behavior During Therapy  [facility] for tasks assessed/performed       Past Medical History:  Diagnosis Date  . CVA (cerebral infarction)   . Diabetes mellitus without complication (HCC)    Type 2  . Encounter for long-term (current) use of other medications   . Headache(784.0)   . Hypertension   . MS (multiple sclerosis) (HCC)    possible  . Neuropathy   . Other and unspecified hyperlipidemia   . Stroke (HCC)    5 strokes  . Thoracic or lumbosacral neuritis or radiculitis, unspecified   . Unspecified late effects of cerebrovascular disease   . Unspecified sleep apnea    does not use cpap    Past Surgical History:  Procedure Laterality Date  . ANTERIOR LAT LUMBAR FUSION N/A 08/30/2014   Procedure: ANTERIOR LATERAL LUMBAR FUSION 1 LEVEL;  Surgeon: Emilee Hero, MD;  Location: [facility] OR;  Service: Orthopedics;  Laterality: N/A;  Lumbar 3-4 lateral interbody fusion with instrumentation and allograft  . BRAIN BIOPSY      There were no vitals filed for this visit.  Subjective Assessment - 02/16/18 1022    Subjective  States he used to enjoy walking as his primary form of exercise.     Pertinent History  hypertension, hyperlipidemia, diabetes, back surgery 2015, CVA x 5 (left field cut-resolved), Craniotomy for  brain biopsy,     Patient Stated Goals  Strengthen my core and walk straight up    Currently in Pain?  No/denies       Treatment- Gait- Ambulated on treadmill x 6 minutes with vc for step length. Able to incr to 1. and 1% incline (pt request). 0.15 mi   Educated in and performed the following exercises for HEP:   Bracing With Arm / Leg Raise (Quadruped)    On hands and knees tighten your stomach as if going to get punched (brace yourself). Alternating, lift arm to shoulder level and opposite leg to hip level. Hold for a count of 5. Then do the opposite arm and leg. Repeat __20_ times. Do _1__ times a day.   DEVELOPMENTAL POSITION: Tall Kneeling to Half Kneeling    Beside your coffee table or chair, start on both knees (tall kneeling position), shift weight to one side. Bring opposite leg forward, place foot flat on surface. Then return to tall kneeling. Repeat with other leg. __10_ reps per set, _1_ sets per day, __5_ days per week   Copyright  VHI. All rights reserved.   Functional Quadriceps: Sit to Stand    Sit on edge of chair, feet flat on floor. Stand upright, extending knees fully. DO NOT PRESS BACKS OF LEGS AGAINST THE CHAIR! Slowly  lower to sitting "light as a feather" Repeat __10__ times per set. Do __2__ sets per session. Do __1__ sessions per day.  http://orth.exer.us/734   Copyright  VHI. All rights reserved.   Toe / Heel Raise    Stand at the counter with hands as light as possible. Gently rock back on heels and raise toes. Hold for 3 seconds. Then rock forward on toes and raise heels. Hold for 3 seconds. Repeat sequence __20__ times per session. Do ____ sessions per week.  Copyright  VHI. All rights reserved.   Plank    Support body on hands and feet. Keep hips in line with torso and arms straight under chest. Hold for __15__ seconds and count out loud to not hold your breath.  Repeat 4 times total  Copyright  VHI. All rights reserved.                          PT Education - 02/16/18 1022    Education Details  see HEP     Person(s) Educated  Patient    Methods  Explanation;Demonstration;Handout    Comprehension  Verbalized understanding;Returned demonstration;Need further instruction       PT Short Term Goals - 02/14/18 1041      PT SHORT TERM GOAL #1   Title  Patient will be independent with HEP (Target all STGs 03/16/2018)    Time  4    Period  Weeks    Status  New    Target Date  03/16/18      PT SHORT TERM GOAL #2   Title  Patient will improve FGA to >=20/30 demonstrating progress towards lower fall risk.    Baseline  02/14/18  17/30    Time  4    Period  Weeks    Status  New      PT SHORT TERM GOAL #3   Title  Patient will decrease 5x sit to stand to <=10.0 sec to demonstrate improved bil LE strengthening.     Time  4    Period  Weeks    Status  New      PT SHORT TERM GOAL #4   Title  Patient will improve gait velocity to >=3.72 ft/sec (norm for age).    Time  4    Period  Weeks    Status  New        PT Long Term Goals - 02/14/18 1048      PT LONG TERM GOAL #1   Title  Patient will be independent with updated HEP and able to verbalize his plan for continued community-based activity to maintain his gains. (Target all LTGs 04/15/18)    Time  8    Period  Weeks    Status  New    Target Date  04/15/18      PT LONG TERM GOAL #2   Title  Patient will improve FGA to >= 23/30 to demonstrate lesser fall risk.     Time  8    Period  Weeks    Status  New      PT LONG TERM GOAL #3   Title  Patient will ambulate modified independent x1000 ft outdoor over level and unlevel surfaces for safety with playing with dog/children on unlevel ground.     Time  8    Period  Weeks    Status  New      PT LONG TERM GOAL #4   Title  Patient will perform 5x sit to stand <=8.5 seconds demonstrating improved bil LE strength.    Time  8    Period  Weeks    Status  New             Plan - 02/16/18 1023    Clinical Impression Statement  Session focused on establishing a HEP to address strengthening legs and core. Patient very motivated and anticipate good progress.     Rehab Potential  Good    Clinical Impairments Affecting Rehab Potential  decr short term memory    PT Frequency  2x / week    PT Duration  8 weeks    PT Treatment/Interventions  ADLs/Self Care Home Management;Gait training;Cognitive remediation;Neuromuscular re-education;Balance training;Therapeutic exercise;Therapeutic activities;Functional mobility training;Stair training;Patient/family education;Passive range of motion    PT Next Visit Plan  check if questions/problems re: HEP from 3/27; address gait abnormalities (treadmill ok vs over ground); LE and core strengthening    PT Home Exercise Plan  walking his dog 20-30 minutes 1x/day (asked him to incr to 2x/day); quadruped "bird dog"; tall kneeling to 1/2 kneeling; plank x 15 seconds; sit to stand no UEs; heel/toe raises alternating    Consulted and Agree with Plan of Care  Patient       Patient will benefit from skilled therapeutic intervention in order to improve the following deficits and impairments:  Abnormal gait, Decreased balance, Decreased cognition, Decreased mobility, Decreased strength, Impaired tone, Postural dysfunction, Obesity  Visit Diagnosis: Muscle weakness (generalized)     Problem List Patient Active Problem List   Diagnosis Date Noted  . Gait abnormality 10/04/2017  . Radiculopathy 08/30/2014  . Multiple sclerosis (HCC) 08/10/2014  . Diabetes mellitus without complication (HCC)   . CVA (cerebral infarction)   . Thoracic or lumbosacral neuritis or radiculitis, unspecified   . Other and unspecified hyperlipidemia   . Encounter for long-term (current) use of other medications   . Unspecified late effects of cerebrovascular disease   . Unspecified sleep apnea   . 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 Dysarthria 06/01/2013  . Hypertension 06/01/2013    Scherrie November Jazion Atteberry..PT 02/16/2018, 10:29 AM  [facility] [address] [place], [place], [address] Phone: [phone]   Fax:  [phone]  Name: Thomas Nixon MRN: [id number] Date of Birth: 08-08-1971

## 2018-02-21 ENCOUNTER — Ambulatory Visit: Payer: Medicare PPO | Attending: Neurology | Admitting: Physical Therapy

## 2018-02-21 ENCOUNTER — Encounter: Payer: Self-pay | Admitting: Physical Therapy

## 2018-02-21 DIAGNOSIS — M6281 Muscle weakness (generalized): Secondary | ICD-10-CM | POA: Diagnosis not present

## 2018-02-21 DIAGNOSIS — R2681 Unsteadiness on feet: Secondary | ICD-10-CM | POA: Diagnosis not present

## 2018-02-21 DIAGNOSIS — R2689 Other abnormalities of gait and mobility: Secondary | ICD-10-CM | POA: Diagnosis not present

## 2018-02-21 NOTE — Therapy (Signed)
[facility] [address] [place], [place], [address] Phone: [phone]   Fax:  [phone]  Physical Therapy Treatment  Patient Details  Name: Thomas Nixon MRN: [id number] Date of Birth: Apr 27, 1971 Referring Provider: Levert Feinstein, MD   Encounter Date: 02/21/2018  PT End of Session - 02/21/18 1303    Visit Number  3    Number of Visits  17    Date for PT Re-Evaluation  04/15/18    Authorization Type  [employer] MCR    Authorization Time Period  02/14/18 to 04/15/18    PT Start Time  0806    PT Stop Time  0845    PT Time Calculation (min)  39 min    Activity Tolerance  Patient tolerated treatment well    Behavior During Therapy  [facility] for tasks assessed/performed       Past Medical History:  Diagnosis Date  . CVA (cerebral infarction)   . Diabetes mellitus without complication (HCC)    Type 2  . Encounter for long-term (current) use of other medications   . Headache(784.0)   . Hypertension   . MS (multiple sclerosis) (HCC)    possible  . Neuropathy   . Other and unspecified hyperlipidemia   . Stroke (HCC)    5 strokes  . Thoracic or lumbosacral neuritis or radiculitis, unspecified   . Unspecified late effects of cerebrovascular disease   . Unspecified sleep apnea    does not use cpap    Past Surgical History:  Procedure Laterality Date  . ANTERIOR LAT LUMBAR FUSION N/A 08/30/2014   Procedure: ANTERIOR LATERAL LUMBAR FUSION 1 LEVEL;  Surgeon: Emilee Hero, MD;  Location: [facility] OR;  Service: Orthopedics;  Laterality: N/A;  Lumbar 3-4 lateral interbody fusion with instrumentation and allograft  . BRAIN BIOPSY      There were no vitals filed for this visit.  Subjective Assessment - 02/21/18 0811    Subjective  States he had no problems iwth HEP given 3/27 and has done them every morning.     Pertinent History  hypertension, hyperlipidemia, diabetes, back surgery 2015, CVA x 5 (left field cut-resolved),  Craniotomy for brain biopsy,     Patient Stated Goals  Strengthen my core and walk straight up    Currently in Pain?  Yes    Pain Score  4     Pain Location  Leg    Pain Orientation  Right    Pain Descriptors / Indicators  Numbness    Pain Type  Chronic pain    Pain Onset  More than a month ago    Pain Frequency  Intermittent                       [facility] Adult PT Treatment/Exercise - 02/21/18 1253      Exercises   Exercises  Knee/Hip      Lumbar Exercises: Aerobic   Tread Mill  4 min 1. ; 0.1 mi      Knee/Hip Exercises: Standing   SLS  on RLE with 5 second hold focus on not allowing lt hip to drop/trendelenberg      Knee/Hip Exercises: Supine   Bridges  Strengthening;Both;1 set;10 reps with green band for stabilizing with hip abdct      Knee/Hip Exercises: Sidelying   Clams  bil; clam x 10 x 2 green               PT Education - 02/21/18  1302    Education Details  additions to HEP; how Rt hip weakness effects his ambulation (reason for additions to HEP)    Person(s) Educated  Patient    Methods  Explanation;Demonstration;Verbal cues;Handout    Comprehension  Verbalized understanding;Returned demonstration;Verbal cues required;Need further instruction       PT Short Term Goals - 02/14/18 1041      PT SHORT TERM GOAL #1   Title  Patient will be independent with HEP (Target all STGs 03/16/2018)    Time  4    Period  Weeks    Status  New    Target Date  03/16/18      PT SHORT TERM GOAL #2   Title  Patient will improve FGA to >=20/30 demonstrating progress towards lower fall risk.    Baseline  02/14/18  17/30    Time  4    Period  Weeks    Status  New      PT SHORT TERM GOAL #3   Title  Patient will decrease 5x sit to stand to <=10.0 sec to demonstrate improved bil LE strengthening.     Time  4    Period  Weeks    Status  New      PT SHORT TERM GOAL #4   Title  Patient will improve gait velocity to >=3.72 ft/sec (norm for age).    Time  4     Period  Weeks    Status  New        PT Long Term Goals - 02/14/18 1048      PT LONG TERM GOAL #1   Title  Patient will be independent with updated HEP and able to verbalize his plan for continued community-based activity to maintain his gains. (Target all LTGs 04/15/18)    Time  8    Period  Weeks    Status  New    Target Date  04/15/18      PT LONG TERM GOAL #2   Title  Patient will improve FGA to >= 23/30 to demonstrate lesser fall risk.     Time  8    Period  Weeks    Status  New      PT LONG TERM GOAL #3   Title  Patient will ambulate modified independent x1000 ft outdoor over level and unlevel surfaces for safety with playing with dog/children on unlevel ground.     Time  8    Period  Weeks    Status  New      PT LONG TERM GOAL #4   Title  Patient will perform 5x sit to stand <=8.5 seconds demonstrating improved bil LE strength.    Time  8    Period  Weeks    Status  New            Plan - 02/21/18 0845    Clinical Impression Statement  Patient arrived late for session. Focused on gait training (tends to drag/scuff his left foot>rt foot) and hip strengthening to reduce trendelenberg during gait. Patient very appreciative for exercises and explanation. Pt can continue to benefit from PT.     Rehab Potential  Good    Clinical Impairments Affecting Rehab Potential  decr short term memory    PT Frequency  2x / week    PT Duration  8 weeks    PT Treatment/Interventions  ADLs/Self Care Home Management;Gait training;Cognitive remediation;Neuromuscular re-education;Balance training;Therapeutic exercise;Therapeutic activities;Functional mobility training;Stair training;Patient/family education;Passive range of motion  PT Next Visit Plan  ask about ?SLP eval; check if questions/problems re: HEP from 3/27; address gait abnormalities (treadmill ok vs over ground); LE and core strengthening    PT Home Exercise Plan  walking his dog 20-30 minutes 1x/day (asked him to incr  to 2x/day); quadruped "bird dog"; tall kneeling to 1/2 kneeling; plank x 15 seconds; sit to stand no UEs; heel/toe raises alternating; bridge with band; clam band    Consulted and Agree with Plan of Care  Patient       Patient will benefit from skilled therapeutic intervention in order to improve the following deficits and impairments:  Abnormal gait, Decreased balance, Decreased cognition, Decreased mobility, Decreased strength, Impaired tone, Postural dysfunction, Obesity  Visit Diagnosis: Muscle weakness (generalized)  Other abnormalities of gait and mobility     Problem List Patient Active Problem List   Diagnosis Date Noted  . Gait abnormality 10/04/2017  . Radiculopathy 08/30/2014  . Multiple sclerosis (HCC) 08/10/2014  . Diabetes mellitus without complication (HCC)   . CVA (cerebral infarction)   . Thoracic or lumbosacral neuritis or radiculitis, unspecified   . Other and unspecified hyperlipidemia   . Encounter for long-term (current) use of other medications   . Unspecified late effects of cerebrovascular disease   . Unspecified sleep apnea   . 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 Dysarthria 06/01/2013  . Hypertension 06/01/2013    Zena Amos, PT 02/21/2018, 1:08 PM   [facility] [address] [place], [place], [address] Phone: [phone]   Fax:  [phone]  Name: TALAN GILDNER MRN: [id number] Date of Birth: 06-29-1971

## 2018-02-22 DIAGNOSIS — I10 Essential (primary) hypertension: Secondary | ICD-10-CM | POA: Diagnosis not present

## 2018-02-22 DIAGNOSIS — E1140 Type 2 diabetes mellitus with diabetic neuropathy, unspecified: Secondary | ICD-10-CM | POA: Diagnosis not present

## 2018-02-22 DIAGNOSIS — G894 Chronic pain syndrome: Secondary | ICD-10-CM | POA: Diagnosis not present

## 2018-02-22 DIAGNOSIS — Z0000 Encounter for general adult medical examination without abnormal findings: Secondary | ICD-10-CM | POA: Diagnosis not present

## 2018-02-22 DIAGNOSIS — G35 Multiple sclerosis: Secondary | ICD-10-CM | POA: Diagnosis not present

## 2018-02-22 DIAGNOSIS — E119 Type 2 diabetes mellitus without complications: Secondary | ICD-10-CM | POA: Diagnosis not present

## 2018-02-22 DIAGNOSIS — Z125 Encounter for screening for malignant neoplasm of prostate: Secondary | ICD-10-CM | POA: Diagnosis not present

## 2018-02-22 DIAGNOSIS — Z7984 Long term (current) use of oral hypoglycemic drugs: Secondary | ICD-10-CM | POA: Diagnosis not present

## 2018-02-22 DIAGNOSIS — E7800 Pure hypercholesterolemia, unspecified: Secondary | ICD-10-CM | POA: Diagnosis not present

## 2018-02-22 DIAGNOSIS — G629 Polyneuropathy, unspecified: Secondary | ICD-10-CM | POA: Diagnosis not present

## 2018-02-22 DIAGNOSIS — R4701 Aphasia: Secondary | ICD-10-CM | POA: Diagnosis not present

## 2018-02-22 DIAGNOSIS — Z1389 Encounter for screening for other disorder: Secondary | ICD-10-CM | POA: Diagnosis not present

## 2018-02-25 ENCOUNTER — Encounter: Payer: Self-pay | Admitting: Physical Therapy

## 2018-02-25 ENCOUNTER — Ambulatory Visit: Payer: Medicare PPO | Admitting: Physical Therapy

## 2018-02-25 DIAGNOSIS — M6281 Muscle weakness (generalized): Secondary | ICD-10-CM | POA: Diagnosis not present

## 2018-02-25 DIAGNOSIS — R2681 Unsteadiness on feet: Secondary | ICD-10-CM | POA: Diagnosis not present

## 2018-02-25 DIAGNOSIS — R2689 Other abnormalities of gait and mobility: Secondary | ICD-10-CM

## 2018-02-25 NOTE — Therapy (Signed)
[facility] [address] [place], [place], [address] Phone: [phone]   Fax:  [phone]  Physical Therapy Treatment  Patient Details  Name: Thomas Nixon MRN: [id number] Date of Birth: September 29, 1971 Referring Provider: Levert Feinstein, MD   Encounter Date: 02/25/2018  PT End of Session - 02/25/18 1614    Visit Number  4    Number of Visits  17    Date for PT Re-Evaluation  04/15/18    Authorization Type  [employer] MCR    Authorization Time Period  02/14/18 to 04/15/18    PT Start Time  0849    PT Stop Time  0930    PT Time Calculation (min)  41 min    Activity Tolerance  Patient tolerated treatment well    Behavior During Therapy  [facility] for tasks assessed/performed       Past Medical History:  Diagnosis Date  . CVA (cerebral infarction)   . Diabetes mellitus without complication (HCC)    Type 2  . Encounter for long-term (current) use of other medications   . Headache(784.0)   . Hypertension   . MS (multiple sclerosis) (HCC)    possible  . Neuropathy   . Other and unspecified hyperlipidemia   . Stroke (HCC)    5 strokes  . Thoracic or lumbosacral neuritis or radiculitis, unspecified   . Unspecified late effects of cerebrovascular disease   . Unspecified sleep apnea    does not use cpap    Past Surgical History:  Procedure Laterality Date  . ANTERIOR LAT LUMBAR FUSION N/A 08/30/2014   Procedure: ANTERIOR LATERAL LUMBAR FUSION 1 LEVEL;  Surgeon: Emilee Hero, MD;  Location: [facility] OR;  Service: Orthopedics;  Laterality: N/A;  Lumbar 3-4 lateral interbody fusion with instrumentation and allograft  . BRAIN BIOPSY      There were no vitals filed for this visit.  Subjective Assessment - 02/25/18 0851    Subjective  Nothing new since last visit    Pertinent History  hypertension, hyperlipidemia, diabetes, back surgery 2015, CVA x 5 (left field cut-resolved), Craniotomy for brain biopsy,     Patient Stated Goals   Strengthen my core and walk straight up    Currently in Pain?  No/denies    Pain Onset  More than a month ago                       [facility] Adult PT Treatment/Exercise - 02/25/18 0001      Ambulation/Gait   Ambulation/Gait  Yes    Ambulation/Gait Assistance  6: Modified independent (Device/Increase time)    Ambulation Distance (Feet)  240 Feet following treadmill gait    Assistive device  None    Gait Pattern  Step-through pattern;Decreased arm swing - right;Decreased step length - left;Decreased stance time - right;Decreased stride length;Decreased weight shift to right;Right foot flat;Left foot flat    Ambulation Surface  Level;Indoor    Pre-Gait Activities  Alternating forward step taps x 10 reps each leg, then forward step tap>back step/runner's stretch position, repeated 10 times at 6" step, for improved timing/coordination of foot clearance with gait.    Gait Comments  On-ground gait as above followed treadmill gait training activity.      Exercises   Exercises  Knee/Hip;Lumbar      Lumbar Exercises: Aerobic   Tread Mill  8 min, 1.6>1.8 mph, 0.22 miles; cues provided for increased heelstrike, increased step length and foot clearance and upright  posture.      Lumbar Exercises: Seated   Other Seated Lumbar Exercises  Attempted sitting on therapy ball, but transitioned to sitting edge of mat on green balance disc; core stability exercises performed:  alternating UE lifts x 10, bilateral UE lifts x 10, UE lifts with trunk rotation x 10, then attempted marching x 5 reps.  Transition off green disk and onto edge of mat surface for marching x 10 reps, then LAQ with reciprocal arm lifts.  Cues throughout for activation of abdominal musculature.      Knee/Hip Exercises: Supine   Bridges  Strengthening;Both;1 set;10 reps blue theraband for strengthening    Other Supine Knee/Hip Exercises  With legs over red therapy ball:  bridging x 2 sets of 5 reps, hip/knee flexion over the  ball, trunk rotation over the ball    Other Supine Knee/Hip Exercises  --      Knee/Hip Exercises: Sidelying   Clams  bil; clam x 10 x 2 blue theraband               PT Short Term Goals - 02/14/18 1041      PT SHORT TERM GOAL #1   Title  Patient will be independent with HEP (Target all STGs 03/16/2018)    Time  4    Period  Weeks    Status  New    Target Date  03/16/18      PT SHORT TERM GOAL #2   Title  Patient will improve FGA to >=20/30 demonstrating progress towards lower fall risk.    Baseline  02/14/18  17/30    Time  4    Period  Weeks    Status  New      PT SHORT TERM GOAL #3   Title  Patient will decrease 5x sit to stand to <=10.0 sec to demonstrate improved bil LE strengthening.     Time  4    Period  Weeks    Status  New      PT SHORT TERM GOAL #4   Title  Patient will improve gait velocity to >=3.72 ft/sec (norm for age).    Time  4    Period  Weeks    Status  New        PT Long Term Goals - 02/14/18 1048      PT LONG TERM GOAL #1   Title  Patient will be independent with updated HEP and able to verbalize his plan for continued community-based activity to maintain his gains. (Target all LTGs 04/15/18)    Time  8    Period  Weeks    Status  New    Target Date  04/15/18      PT LONG TERM GOAL #2   Title  Patient will improve FGA to >= 23/30 to demonstrate lesser fall risk.     Time  8    Period  Weeks    Status  New      PT LONG TERM GOAL #3   Title  Patient will ambulate modified independent x1000 ft outdoor over level and unlevel surfaces for safety with playing with dog/children on unlevel ground.     Time  8    Period  Weeks    Status  New      PT LONG TERM GOAL #4   Title  Patient will perform 5x sit to stand <=8.5 seconds demonstrating improved bil LE strength.    Time  8  Period  Weeks    Status  New            Plan - 02/25/18 1615    Clinical Impression Statement  Reviewed HEP and address trunk/core stability and gait  activities on and off treadmill this visit.  Needs occasional cues for posture and foot clearance during gait activities, but pt is able to self-correct posture/foot clearance about 50% of the time.  Pt will continue to benefit from skilled PT towards goals.    Rehab Potential  Good    Clinical Impairments Affecting Rehab Potential  decr short term memory    PT Frequency  2x / week    PT Duration  8 weeks    PT Treatment/Interventions  ADLs/Self Care Home Management;Gait training;Cognitive remediation;Neuromuscular re-education;Balance training;Therapeutic exercise;Therapeutic activities;Functional mobility training;Stair training;Patient/family education;Passive range of motion    PT Next Visit Plan  ask about ?SLP eval; address gait abnormalities (treadmill ok vs over ground); LE and core strengthening    PT Home Exercise Plan  walking his dog 20-30 minutes 1x/day (asked him to incr to 2x/day); quadruped "bird dog"; tall kneeling to 1/2 kneeling; plank x 15 seconds; sit to stand no UEs; heel/toe raises alternating; bridge with band; clam band    Consulted and Agree with Plan of Care  Patient       Patient will benefit from skilled therapeutic intervention in order to improve the following deficits and impairments:  Abnormal gait, Decreased balance, Decreased cognition, Decreased mobility, Decreased strength, Impaired tone, Postural dysfunction, Obesity  Visit Diagnosis: Muscle weakness (generalized)  Other abnormalities of gait and mobility     Problem List Patient Active Problem List   Diagnosis Date Noted  . Gait abnormality 10/04/2017  . Radiculopathy 08/30/2014  . Multiple sclerosis (HCC) 08/10/2014  . Diabetes mellitus without complication (HCC)   . CVA (cerebral infarction)   . Thoracic or lumbosacral neuritis or radiculitis, unspecified   . Other and unspecified hyperlipidemia   . Encounter for long-term (current) use of other medications   . Unspecified late effects of  cerebrovascular disease   . Unspecified sleep apnea   . 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 Dysarthria 06/01/2013  . Hypertension 06/01/2013    MARRIOTT,AMY W. 02/25/2018, 4:17 PM  [facility] [address] [place], [place], [address] Phone: [phone]   Fax:  [phone]  Name: MERT DIETRICK MRN: [id number] Date of Birth: 02-17-1971

## 2018-02-28 ENCOUNTER — Ambulatory Visit: Payer: Medicare PPO | Admitting: Physical Therapy

## 2018-02-28 ENCOUNTER — Encounter: Payer: Self-pay | Admitting: Physical Therapy

## 2018-02-28 DIAGNOSIS — R2681 Unsteadiness on feet: Secondary | ICD-10-CM | POA: Diagnosis not present

## 2018-02-28 DIAGNOSIS — M6281 Muscle weakness (generalized): Secondary | ICD-10-CM

## 2018-02-28 DIAGNOSIS — R2689 Other abnormalities of gait and mobility: Secondary | ICD-10-CM | POA: Diagnosis not present

## 2018-02-28 NOTE — Patient Instructions (Addendum)
(  Exercise) Monday Tuesday Wednesday Thursday Friday Saturday Sunday                                                                                                      Access Code: [facility]  URL: [email or web address]  Date: 02/28/2018  Prepared by: Veda Canning   Exercises  Supine Bridge with Leg Extension - 10 reps - 3 sets - 1x daily - 7x weekly  Bridge with Hip Abduction and Resistance - 10 reps - 3 sets - 1x daily - 7x weekly

## 2018-02-28 NOTE — Therapy (Signed)
[facility] [address] [place], [place], [address] Phone: [phone]   Fax:  [phone]  Physical Therapy Treatment  Patient Details  Name: Thomas Nixon MRN: [id number] Date of Birth: [birth year] Referring Provider: Levert Feinstein, MD   Encounter Date: 02/28/2018  PT End of Session - 02/28/18 1432    Visit Number  5    Number of Visits  17    Date for PT Re-Evaluation  04/15/18    Authorization Type  [employer] MCR    Authorization Time Period  02/14/18 to 04/15/18    PT Start Time  0800    PT Stop Time  0844    PT Time Calculation (min)  44 min    Activity Tolerance  Patient tolerated treatment well    Behavior During Therapy  [facility] for tasks assessed/performed       Past Medical History:  Diagnosis Date  . CVA (cerebral infarction)   . Diabetes mellitus without complication (HCC)    Type 2  . Encounter for long-term (current) use of other medications   . Headache(784.0)   . Hypertension   . MS (multiple sclerosis) (HCC)    possible  . Neuropathy   . Other and unspecified hyperlipidemia   . Stroke (HCC)    5 strokes  . Thoracic or lumbosacral neuritis or radiculitis, unspecified   . Unspecified late effects of cerebrovascular disease   . Unspecified sleep apnea    does not use cpap    Past Surgical History:  Procedure Laterality Date  . ANTERIOR LAT LUMBAR FUSION N/A 08/30/2014   Procedure: ANTERIOR LATERAL LUMBAR FUSION 1 LEVEL;  Surgeon: Emilee Hero, MD;  Location: [facility] OR;  Service: Orthopedics;  Laterality: N/A;  Lumbar 3-4 lateral interbody fusion with instrumentation and allograft  . BRAIN BIOPSY      There were no vitals filed for this visit.  Subjective Assessment - 02/28/18 0757    Subjective  No changes. Was asking about the Speech Therapist and states he knows he needs to talk slower and louder. Denies need for SLP.     Pertinent History  hypertension, hyperlipidemia, diabetes, back  surgery 2015, CVA x 5 (left field cut-resolved), Craniotomy for brain biopsy,     Patient Stated Goals  Strengthen my core and walk straight up    Currently in Pain?  No/denies    Pain Onset  --                       [facility] Adult PT Treatment/Exercise - 02/28/18 0814      Lumbar Exercises: Supine   Clam  10 reps black band; bil     Bridge  Compliant;10 reps extend each leg and down; buterfly with black band x 10      Knee/Hip Exercises: Aerobic   Elliptical  L1.5, 2 min fwd, 1 min backward      Knee/Hip Exercises: Standing   Other Standing Knee Exercises  tall kneeling, 2.2 lb ball, UE PNF D1, D2 patterns x 10 each side; 4.4 lb ball x 10 each side    Other Standing Knee Exercises  tall kneeling squats x 10           Balance Exercises - 02/28/18 0825      Balance Exercises: Standing   Standing Eyes Closed  Narrow base of support (BOS);Wide (BOA);Foam/compliant surface;Head turns unilateral UE flexion x 10 each; horiz, vertical x 10  PT Education - 02/28/18 1432    Education Details  additions to HEP for core, hip weakness    Person(s) Educated  Patient    Methods  Explanation;Demonstration;Verbal cues;Handout    Comprehension  Need further instruction;Verbal cues required;Returned demonstration;Verbalized understanding       PT Short Term Goals - 02/14/18 1041      PT SHORT TERM GOAL #1   Title  Patient will be independent with HEP (Target all STGs 03/16/2018)    Time  4    Period  Weeks    Status  New    Target Date  03/16/18      PT SHORT TERM GOAL #2   Title  Patient will improve FGA to >=20/30 demonstrating progress towards lower fall risk.    Baseline  02/14/18  17/30    Time  4    Period  Weeks    Status  New      PT SHORT TERM GOAL #3   Title  Patient will decrease 5x sit to stand to <=10.0 sec to demonstrate improved bil LE strengthening.     Time  4    Period  Weeks    Status  New      PT SHORT TERM GOAL #4   Title  Patient  will improve gait velocity to >=3.72 ft/sec (norm for age).    Time  4    Period  Weeks    Status  New        PT Long Term Goals - 02/14/18 1048      PT LONG TERM GOAL #1   Title  Patient will be independent with updated HEP and able to verbalize his plan for continued community-based activity to maintain his gains. (Target all LTGs 04/15/18)    Time  8    Period  Weeks    Status  New    Target Date  04/15/18      PT LONG TERM GOAL #2   Title  Patient will improve FGA to >= 23/30 to demonstrate lesser fall risk.     Time  8    Period  Weeks    Status  New      PT LONG TERM GOAL #3   Title  Patient will ambulate modified independent x1000 ft outdoor over level and unlevel surfaces for safety with playing with dog/children on unlevel ground.     Time  8    Period  Weeks    Status  New      PT LONG TERM GOAL #4   Title  Patient will perform 5x sit to stand <=8.5 seconds demonstrating improved bil LE strength.    Time  8    Period  Weeks    Status  New            Plan - 02/28/18 1433    Clinical Impression Statement  Skilled session focused on ther-ex for strengthening hip and core to improve balance and gait. Patient requested one seated rest period during session ("legs shaking"). Patient continues to benefit from PT.     Rehab Potential  Good    Clinical Impairments Affecting Rehab Potential  decr short term memory    PT Frequency  2x / week    PT Duration  8 weeks    PT Treatment/Interventions  ADLs/Self Care Home Management;Gait training;Cognitive remediation;Neuromuscular re-education;Balance training;Therapeutic exercise;Therapeutic activities;Functional mobility training;Stair training;Patient/family education;Passive range of motion    PT Next Visit Plan  address gait abnormalities (  treadmill ok vs over ground); LE and core strengthening    PT Home Exercise Plan  walking his dog 20-30 minutes 1x/day (asked him to incr to 2x/day); quadruped "bird dog"; tall  kneeling to 1/2 kneeling; plank x 15 seconds; sit to stand no UEs; heel/toe raises alternating; bridge with band; clam band    Consulted and Agree with Plan of Care  Patient       Patient will benefit from skilled therapeutic intervention in order to improve the following deficits and impairments:  Abnormal gait, Decreased balance, Decreased cognition, Decreased mobility, Decreased strength, Impaired tone, Postural dysfunction, Obesity  Visit Diagnosis: Muscle weakness (generalized)  Unsteadiness on feet     Problem List Patient Active Problem List   Diagnosis Date Noted  . Gait abnormality 10/04/2017  . Radiculopathy 08/30/2014  . Multiple sclerosis (HCC) 08/10/2014  . Diabetes mellitus without complication (HCC)   . CVA (cerebral infarction)   . Thoracic or lumbosacral neuritis or radiculitis, unspecified   . Other and unspecified hyperlipidemia   . Encounter for long-term (current) use of other medications   . Unspecified late effects of cerebrovascular disease   . Unspecified sleep apnea   . 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 Dysarthria 06/01/2013  . Hypertension 06/01/2013    Zena Amos, PT 02/28/2018, 2:36 PM  [facility] [address] [place], [place], [address] Phone: [phone]   Fax:  [phone]  Name: Thomas Nixon MRN: [id number] Date of Birth: 1971-01-15

## 2018-03-02 ENCOUNTER — Ambulatory Visit: Payer: Medicare PPO | Admitting: Physical Therapy

## 2018-03-07 ENCOUNTER — Encounter: Payer: Self-pay | Admitting: Physical Therapy

## 2018-03-07 ENCOUNTER — Ambulatory Visit: Payer: Medicare PPO | Admitting: Physical Therapy

## 2018-03-07 DIAGNOSIS — M6281 Muscle weakness (generalized): Secondary | ICD-10-CM | POA: Diagnosis not present

## 2018-03-07 DIAGNOSIS — R2689 Other abnormalities of gait and mobility: Secondary | ICD-10-CM | POA: Diagnosis not present

## 2018-03-07 DIAGNOSIS — R2681 Unsteadiness on feet: Secondary | ICD-10-CM | POA: Diagnosis not present

## 2018-03-07 NOTE — Patient Instructions (Signed)
SIT TO STAND: Feet in Modified Tandem    Place left foot in front of the other and on 4 inch block/step/book. Lean chest forward. Raise hips and straighten knees to stand. _10__ reps per set, __3_ sets, __5_ days per week Hold onto a support.    Copyright  [employer]. All rights reserved.

## 2018-03-07 NOTE — Therapy (Signed)
[facility] [address] [place], [place], [address] Phone: [phone]   Fax:  [phone]  Physical Therapy Treatment  Patient Details  Name: Thomas Nixon MRN: [id number] Date of Birth: 08-21-1971 Referring Provider: Levert Feinstein, MD   Encounter Date: 03/07/2018  PT End of Session - 03/07/18 0858    Visit Number  6    Number of Visits  17    Date for PT Re-Evaluation  04/15/18    Authorization Type  [employer] MCR    Authorization Time Period  02/14/18 to 04/15/18    PT Start Time  0847    PT Stop Time  0930    PT Time Calculation (min)  43 min    Activity Tolerance  Patient tolerated treatment well    Behavior During Therapy  [facility] for tasks assessed/performed       Past Medical History:  Diagnosis Date  . CVA (cerebral infarction)   . Diabetes mellitus without complication (HCC)    Type 2  . Encounter for long-term (current) use of other medications   . Headache(784.0)   . Hypertension   . MS (multiple sclerosis) (HCC)    possible  . Neuropathy   . Other and unspecified hyperlipidemia   . Stroke (HCC)    5 strokes  . Thoracic or lumbosacral neuritis or radiculitis, unspecified   . Unspecified late effects of cerebrovascular disease   . Unspecified sleep apnea    does not use cpap    Past Surgical History:  Procedure Laterality Date  . ANTERIOR LAT LUMBAR FUSION N/A 08/30/2014   Procedure: ANTERIOR LATERAL LUMBAR FUSION 1 LEVEL;  Surgeon: Emilee Hero, MD;  Location: [facility];  Service: Orthopedics;  Laterality: N/A;  Lumbar 3-4 lateral interbody fusion with instrumentation and allograft  . BRAIN BIOPSY      There were no vitals filed for this visit.  Subjective Assessment - 03/07/18 0852    Subjective  No changes. no questions    Pertinent History  hypertension, hyperlipidemia, diabetes, back surgery 2015, CVA x 5 (left field cut-resolved), Craniotomy for brain biopsy,     Patient Stated Goals   Strengthen my core and walk straight up    Currently in Pain?  No/denies                       [facility] Adult PT Treatment/Exercise - 03/07/18 0855      Transfers   Sit to Stand  7: Independent    Stand to Sit  7: Independent      Ambulation/Gait   Ambulation/Gait Assistance  4: Min assist;6: Modified independent (Device/Increase time)    Ambulation/Gait Assistance Details  assist for weight shift over RLE; improved with incr velocity; improved foot clearance with cues for heel strike    Ambulation Distance (Feet)  240 Feet plus treadmill 0.16 mi    Assistive device  None    Gait Pattern  Step-through pattern;Decreased arm swing - right;Decreased step length - left;Decreased stance time - right;Decreased stride length;Decreased weight shift to right;Right foot flat;Left foot flat;Poor foot clearance - left;Poor foot clearance - right    Ambulation Surface  Indoor    Pre-Gait Activities  SLS on RLE activities      Lumbar Exercises: Aerobic   Tread Mill  2.2 mph x 5 min x 0.16 mi      Knee/Hip Exercises: Standing   Other Standing Knee Exercises  standing on blue airex foam, feet apart,  5# ball doing PNF D1 and D2 pattens x 10 each side      Knee/Hip Exercises: Seated   Sit to Sand  10 reps;without UE support lt foot on 2" and then 4" block; 3 sets          Balance Exercises - 03/07/18 0904      Balance Exercises: Standing   Standing Eyes Closed  Narrow base of support (BOS);Foam/compliant surface;30 secs    SLS  Eyes open;Foam/compliant surface;Upper extremity support 1;5 reps;10 secs;30 secs;Solid surface each LE on foam 10 sec, UE support; solid 30 sec use cone     SLS with Vectors  Foam/compliant surface;Intermittent upper extremity assist blue airex , cone taps x 20 each leg    Step Ups  Forward;4 inch;UE support 1 RLE x 15 reps        PT Education - 03/07/18 1556    Education Details  substitution/upgrade to HEP    Person(s) Educated  Patient    Methods   Explanation;Demonstration;Handout;Verbal cues    Comprehension  Verbalized understanding;Returned demonstration;Verbal cues required;Need further instruction       PT Short Term Goals - 02/14/18 1041      PT SHORT TERM GOAL #1   Title  Patient will be independent with HEP (Target all STGs 03/16/2018)    Time  4    Period  Weeks    Status  New    Target Date  03/16/18      PT SHORT TERM GOAL #2   Title  Patient will improve FGA to >=20/30 demonstrating progress towards lower fall risk.    Baseline  02/14/18  17/30    Time  4    Period  Weeks    Status  New      PT SHORT TERM GOAL #3   Title  Patient will decrease 5x sit to stand to <=10.0 sec to demonstrate improved bil LE strengthening.     Time  4    Period  Weeks    Status  New      PT SHORT TERM GOAL #4   Title  Patient will improve gait velocity to >=3.72 ft/sec (norm for age).    Time  4    Period  Weeks    Status  New        PT Long Term Goals - 02/14/18 1048      PT LONG TERM GOAL #1   Title  Patient will be independent with updated HEP and able to verbalize his plan for continued community-based activity to maintain his gains. (Target all LTGs 04/15/18)    Time  8    Period  Weeks    Status  New    Target Date  04/15/18      PT LONG TERM GOAL #2   Title  Patient will improve FGA to >= 23/30 to demonstrate lesser fall risk.     Time  8    Period  Weeks    Status  New      PT LONG TERM GOAL #3   Title  Patient will ambulate modified independent x1000 ft outdoor over level and unlevel surfaces for safety with playing with dog/children on unlevel ground.     Time  8    Period  Weeks    Status  New      PT LONG TERM GOAL #4   Title  Patient will perform 5x sit to stand <=8.5 seconds demonstrating improved bil LE strength.  Time  8    Period  Weeks    Status  New            Plan - 03/07/18 1557    Clinical Impression Statement  Session focused initially on gait training with pt continuing to  have decreased stance time and weight shift on RLE. Did well with min assist to pelvis to assist rt weight-shift, however did not maintain when assist removed. Strength training also included focusing on LEs and core to improve his gait and balance. Pt continues to benefit from PT.     Rehab Potential  Good    Clinical Impairments Affecting Rehab Potential  decr short term memory    PT Frequency  2x / week    PT Duration  8 weeks    PT Treatment/Interventions  ADLs/Self Care Home Management;Gait training;Cognitive remediation;Neuromuscular re-education;Balance training;Therapeutic exercise;Therapeutic activities;Functional mobility training;Stair training;Patient/family education;Passive range of motion    PT Next Visit Plan   LE and core strengthening; gait and balance training--try outside or over compliant surfaces    PT Home Exercise Plan  quadruped "bird dog"; tall kneeling to 1/2 kneeling; plank x 15 seconds; sit to stand no UEs with lt foot on 4" block/book; heel/toe raises alternating; bridge with band; clam band    Consulted and Agree with Plan of Care  Patient       Patient will benefit from skilled therapeutic intervention in order to improve the following deficits and impairments:  Abnormal gait, Decreased balance, Decreased cognition, Decreased mobility, Decreased strength, Impaired tone, Postural dysfunction, Obesity  Visit Diagnosis: Muscle weakness (generalized)  Other abnormalities of gait and mobility  Unsteadiness on feet     Problem List Patient Active Problem List   Diagnosis Date Noted  . Gait abnormality 10/04/2017  . Radiculopathy 08/30/2014  . Multiple sclerosis (HCC) 08/10/2014  . Diabetes mellitus without complication (HCC)   . CVA (cerebral infarction)   . Thoracic or lumbosacral neuritis or radiculitis, unspecified   . Other and unspecified hyperlipidemia   . Encounter for long-term (current) use of other medications   . Unspecified late effects of  cerebrovascular disease   . Unspecified sleep apnea   . 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 Dysarthria 06/01/2013  . Hypertension 06/01/2013    Zena Amos, PT 03/07/2018, 4:01 PM  [facility] [address] [place], [place], [address] Phone: [phone]   Fax:  [phone]  Name: HOBERT POPLASKI MRN: [id number] Date of Birth: 11-15-1971

## 2018-03-09 ENCOUNTER — Encounter: Payer: Self-pay | Admitting: Physical Therapy

## 2018-03-09 ENCOUNTER — Ambulatory Visit: Payer: Medicare PPO | Admitting: Physical Therapy

## 2018-03-09 DIAGNOSIS — R2681 Unsteadiness on feet: Secondary | ICD-10-CM | POA: Diagnosis not present

## 2018-03-09 DIAGNOSIS — R2689 Other abnormalities of gait and mobility: Secondary | ICD-10-CM | POA: Diagnosis not present

## 2018-03-09 DIAGNOSIS — M6281 Muscle weakness (generalized): Secondary | ICD-10-CM | POA: Diagnosis not present

## 2018-03-09 NOTE — Therapy (Signed)
[facility] [address] [place], [place], [address] Phone: [phone]   Fax:  [phone]  Physical Therapy Treatment  Patient Details  Name: Thomas Nixon MRN: [id number] Date of Birth: 09-16-1971 Referring Provider: Levert Feinstein, MD   Encounter Date: 03/09/2018  PT End of Session - 03/09/18 1306    Visit Number  7    Number of Visits  17    Date for PT Re-Evaluation  04/15/18    Authorization Type  [employer] MCR    Authorization Time Period  02/14/18 to 04/15/18    PT Start Time  0900 late arrival    PT Stop Time  0930    PT Time Calculation (min)  30 min    Activity Tolerance  Patient tolerated treatment well    Behavior During Therapy  [facility] for tasks assessed/performed       Past Medical History:  Diagnosis Date  . CVA (cerebral infarction)   . Diabetes mellitus without complication (HCC)    Type 2  . Encounter for long-term (current) use of other medications   . Headache(784.0)   . Hypertension   . MS (multiple sclerosis) (HCC)    possible  . Neuropathy   . Other and unspecified hyperlipidemia   . Stroke (HCC)    5 strokes  . Thoracic or lumbosacral neuritis or radiculitis, unspecified   . Unspecified late effects of cerebrovascular disease   . Unspecified sleep apnea    does not use cpap    Past Surgical History:  Procedure Laterality Date  . ANTERIOR LAT LUMBAR FUSION N/A 08/30/2014   Procedure: ANTERIOR LATERAL LUMBAR FUSION 1 LEVEL;  Surgeon: Emilee Hero, MD;  Location: [facility] OR;  Service: Orthopedics;  Laterality: N/A;  Lumbar 3-4 lateral interbody fusion with instrumentation and allograft  . BRAIN BIOPSY      There were no vitals filed for this visit.  Subjective Assessment - 03/09/18 0901    Subjective  A little sore on the rt hamstring from work out the other day.     Pertinent History  hypertension, hyperlipidemia, diabetes, back surgery 2015, CVA x 5 (left field cut-resolved),  Craniotomy for brain biopsy,     Patient Stated Goals  Strengthen my core and walk straight up    Currently in Pain?  No/denies                       [facility] Adult PT Treatment/Exercise - 03/09/18 0903      Lumbar Exercises: Aerobic   Tread Mill  up to 2.4 mph (holding on bil UE in front) x 5 minutes; vc for posture and foot clearance/heel strike      Lumbar Exercises: Seated   Long Arc Quad on [place]  Strengthening;Both;1 set;20 reps    Other Seated Lumbar Exercises  alternate shoulder flexion seated on blue physioball x 20 each; progressed to using 2 lb weighted ball for PNF D1 and D2 patterns x 10 each    Other Seated Lumbar Exercises  seated blue ball anterior/posterior pelvic tilts, left/right weight shifts x 10 each; progressed to reaching across midline up high, chest height, and down low      Knee/Hip Exercises: Seated   Sit to Sand  5 reps;without UE support on blue airex foam      plank on forearms on mat table x 20 sec; plank forearms on blue airex foam on top of mat table x 20 sec  PT Short Term Goals - 02/14/18 1041      PT SHORT TERM GOAL #1   Title  Patient will be independent with HEP (Target all STGs 03/16/2018)    Time  4    Period  Weeks    Status  New    Target Date  03/16/18      PT SHORT TERM GOAL #2   Title  Patient will improve FGA to >=20/30 demonstrating progress towards lower fall risk.    Baseline  02/14/18  17/30    Time  4    Period  Weeks    Status  New      PT SHORT TERM GOAL #3   Title  Patient will decrease 5x sit to stand to <=10.0 sec to demonstrate improved bil LE strengthening.     Time  4    Period  Weeks    Status  New      PT SHORT TERM GOAL #4   Title  Patient will improve gait velocity to >=3.72 ft/sec (norm for age).    Time  4    Period  Weeks    Status  New        PT Long Term Goals - 02/14/18 1048      PT LONG TERM GOAL #1   Title  Patient will be independent with updated HEP and able to  verbalize his plan for continued community-based activity to maintain his gains. (Target all LTGs 04/15/18)    Time  8    Period  Weeks    Status  New    Target Date  04/15/18      PT LONG TERM GOAL #2   Title  Patient will improve FGA to >= 23/30 to demonstrate lesser fall risk.     Time  8    Period  Weeks    Status  New      PT LONG TERM GOAL #3   Title  Patient will ambulate modified independent x1000 ft outdoor over level and unlevel surfaces for safety with playing with dog/children on unlevel ground.     Time  8    Period  Weeks    Status  New      PT LONG TERM GOAL #4   Title  Patient will perform 5x sit to stand <=8.5 seconds demonstrating improved bil LE strength.    Time  8    Period  Weeks    Status  New              Patient will benefit from skilled therapeutic intervention in order to improve the following deficits and impairments:     Visit Diagnosis: Muscle weakness (generalized)  Other abnormalities of gait and mobility     Problem List Patient Active Problem List   Diagnosis Date Noted  . Gait abnormality 10/04/2017  . Radiculopathy 08/30/2014  . Multiple sclerosis (HCC) 08/10/2014  . Diabetes mellitus without complication (HCC)   . CVA (cerebral infarction)   . Thoracic or lumbosacral neuritis or radiculitis, unspecified   . Other and unspecified hyperlipidemia   . Encounter for long-term (current) use of other medications   . Unspecified late effects of cerebrovascular disease   . Unspecified sleep apnea   . 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 Dysarthria 06/01/2013  . Hypertension 06/01/2013    Scherrie November Miron Marxen. PT 03/09/2018, 1:08 PM   [facility] [address] [place], [place], [address] Phone: [phone]  Fax:  [phone]  Name: Thomas Nixon MRN: [id number] Date of Birth: 09-Aug-1971

## 2018-03-14 ENCOUNTER — Ambulatory Visit: Payer: Medicare PPO | Admitting: Physical Therapy

## 2018-03-16 ENCOUNTER — Ambulatory Visit: Payer: Medicare PPO | Admitting: Neurology

## 2018-03-16 ENCOUNTER — Ambulatory Visit: Payer: Medicare PPO | Admitting: Physical Therapy

## 2018-03-17 ENCOUNTER — Encounter: Payer: Self-pay | Admitting: Physical Therapy

## 2018-03-17 ENCOUNTER — Ambulatory Visit: Payer: Medicare PPO | Admitting: Physical Therapy

## 2018-03-17 DIAGNOSIS — R2689 Other abnormalities of gait and mobility: Secondary | ICD-10-CM | POA: Diagnosis not present

## 2018-03-17 DIAGNOSIS — R2681 Unsteadiness on feet: Secondary | ICD-10-CM | POA: Diagnosis not present

## 2018-03-17 DIAGNOSIS — M6281 Muscle weakness (generalized): Secondary | ICD-10-CM | POA: Diagnosis not present

## 2018-03-17 NOTE — Therapy (Signed)
[facility] [address] [place], [place], [address] Phone: [phone]   Fax:  [phone]  Physical Therapy Treatment  Patient Details  Name: Thomas Nixon MRN: [id number] Date of Birth: 20-Apr-1971 Referring Provider: Marcial Pacas, MD   Encounter Date: 03/17/2018  PT End of Session - 03/17/18 0942    Visit Number  8    Number of Visits  17    Date for PT Re-Evaluation  04/15/18    Authorization Type  [employer] MCR    Authorization Time Period  02/14/18 to 04/15/18    PT Start Time  0935    PT Stop Time  1015    PT Time Calculation (min)  40 min    Activity Tolerance  Patient tolerated treatment well    Behavior During Therapy  [facility] for tasks assessed/performed       Past Medical History:  Diagnosis Date  . CVA (cerebral infarction)   . Diabetes mellitus without complication (HCC)    Type 2  . Encounter for long-term (current) use of other medications   . Headache(784.0)   . Hypertension   . MS (multiple sclerosis) ([place])    possible  . Neuropathy   . Other and unspecified hyperlipidemia   . Stroke ([place])    5 strokes  . Thoracic or lumbosacral neuritis or radiculitis, unspecified   . Unspecified late effects of cerebrovascular disease   . Unspecified sleep apnea    does not use cpap    Past Surgical History:  Procedure Laterality Date  . ANTERIOR LAT LUMBAR FUSION N/A 08/30/2014   Procedure: ANTERIOR LATERAL LUMBAR FUSION 1 LEVEL;  Surgeon: Sinclair Ship, MD;  Location: [facility];  Service: Orthopedics;  Laterality: N/A;  Lumbar 3-4 lateral interbody fusion with instrumentation and allograft  . BRAIN BIOPSY      There were no vitals filed for this visit.  Subjective Assessment - 03/17/18 0937    Subjective  Didn't sleep well last night and sluggish.     Pertinent History  hypertension, hyperlipidemia, diabetes, back surgery 2015, CVA x 5 (left field cut-resolved), Craniotomy for brain biopsy,     Patient Stated Goals  Strengthen my core and walk straight up    Currently in Pain?  No/denies         [facility] PT Assessment - 03/17/18 0946      Transfers   Five time sit to stand comments   10.6      Functional Gait  Assessment   Gait assessed   Yes    Gait Level Surface  Walks 20 ft in less than 7 sec but greater than 5.5 sec, uses assistive device, slower speed, mild gait deviations, or deviates 6-10 in outside of the 12 in walkway width.    Change in Gait Speed  Able to smoothly change walking speed without loss of balance or gait deviation. Deviate no more than 6 in outside of the 12 in walkway width.    Gait with Horizontal Head Turns  Performs head turns smoothly with slight change in gait velocity (eg, minor disruption to smooth gait path), deviates 6-10 in outside 12 in walkway width, or uses an assistive device.    Gait with Vertical Head Turns  Performs task with slight change in gait velocity (eg, minor disruption to smooth gait path), deviates 6 - 10 in outside 12 in walkway width or uses assistive device    Gait and Pivot Turn  Pivot turns safely within 3  sec and stops quickly with no loss of balance.    Step Over Obstacle  Is able to step over 2 stacked shoe boxes taped together (9 in total height) without changing gait speed. No evidence of imbalance.    Gait with Narrow Base of Support  Ambulates less than 4 steps heel to toe or cannot perform without assistance.    Gait with Eyes Closed  Walks 20 ft, uses assistive device, slower speed, mild gait deviations, deviates 6-10 in outside 12 in walkway width. Ambulates 20 ft in less than 9 sec but greater than 7 sec.    Ambulating Backwards  Walks 20 ft, uses assistive device, slower speed, mild gait deviations, deviates 6-10 in outside 12 in walkway width.    Steps  Alternating feet, must use rail.    Total Score  21                   [facility] Adult PT Treatment/Exercise - 03/17/18 0946      Ambulation/Gait    Ambulation/Gait Assistance  6: Modified independent (Device/Increase time)    Ambulation Distance (Feet)  1000 Feet    Assistive device  None    Gait Pattern  Step-through pattern;Decreased arm swing - right;Decreased step length - left;Decreased stance time - right;Decreased stride length;Decreased weight shift to right;Right foot flat;Left foot flat;Poor foot clearance - left;Poor foot clearance - right    Ambulation Surface  Outdoor;Unlevel;Level;Indoor;Paved;Grass    Gait velocity  3.58 ft/sec    Stairs  Yes    Stairs Assistance  6: Modified independent (Device/Increase time)    Stair Management Technique  No rails;Alternating pattern;Forwards    Number of Stairs  4    Height of Stairs  6             PT Education - 03/17/18 1417    Education Details  results of STG assessment    Person(s) Educated  Patient    Methods  Explanation    Comprehension  Verbalized understanding       PT Short Term Goals - 03/17/18 3149      PT SHORT TERM GOAL #1   Title  Patient will be independent with HEP (Target all STGs 03/16/2018)    Time  4    Period  Weeks    Status  Achieved      PT SHORT TERM GOAL #2   Title  Patient will improve FGA to >=20/30 demonstrating progress towards lower fall risk.    Baseline  02/14/18  17/30; 4/25 21/30    Time  4    Period  Weeks    Status  Achieved      PT SHORT TERM GOAL #3   Title  Patient will decrease 5x sit to stand to <=10.0 sec to demonstrate improved bil LE strengthening.     Baseline  4/25 8.94 sec    Time  4    Period  Weeks    Status  Achieved      PT SHORT TERM GOAL #4   Title  Patient will improve gait velocity to >=3.72 ft/sec (norm for age).    Baseline  03/17/18  3.58 ft/sec    Time  4    Period  Weeks    Status  Partially Met        PT Long Term Goals - 02/14/18 1048      PT LONG TERM GOAL #1   Title  Patient will be independent with updated HEP and  able to verbalize his plan for continued community-based activity to  maintain his gains. (Target all LTGs 04/15/18)    Time  8    Period  Weeks    Status  New    Target Date  04/15/18      PT LONG TERM GOAL #2   Title  Patient will improve FGA to >= 23/30 to demonstrate lesser fall risk.     Time  8    Period  Weeks    Status  New      PT LONG TERM GOAL #3   Title  Patient will ambulate modified independent x1000 ft outdoor over level and unlevel surfaces for safety with playing with dog/children on unlevel ground.     Time  8    Period  Weeks    Status  New      PT LONG TERM GOAL #4   Title  Patient will perform 5x sit to stand <=8.5 seconds demonstrating improved bil LE strength.    Time  8    Period  Weeks    Status  New            Plan - 03/17/18 1419    Clinical Impression Statement  STGs assessed with pt meeting 3 of 4 goals and 4th goal partially met (improved but not to goal level). Patient remains hesitant with gait (especially outdoors in wide open spaces and on unlevel ground) due to fear of falling. When indoors he is doing much better with relaxing UEs and able to improve velocity. He feels his confidence with walking has improved and that his overall strength is much improved. Patient can continue to benefit from PT to progress towards LTGs and reduced fall risk.     Rehab Potential  Good    Clinical Impairments Affecting Rehab Potential  decr short term memory    PT Frequency  2x / week    PT Duration  8 weeks    PT Treatment/Interventions  ADLs/Self Care Home Management;Gait training;Cognitive remediation;Neuromuscular re-education;Balance training;Therapeutic exercise;Therapeutic activities;Functional mobility training;Stair training;Patient/family education;Passive range of motion    PT Next Visit Plan   LE and core strengthening; gait and balance training--over compliant surfaces    PT Home Exercise Plan  quadruped "bird dog"; tall kneeling to 1/2 kneeling; plank x 15 seconds; sit to stand no UEs with lt foot on 4" block/book;  heel/toe raises alternating; bridge with band; clam band    Consulted and Agree with Plan of Care  Patient       Patient will benefit from skilled therapeutic intervention in order to improve the following deficits and impairments:  Abnormal gait, Decreased balance, Decreased cognition, Decreased mobility, Decreased strength, Impaired tone, Postural dysfunction, Obesity  Visit Diagnosis: Muscle weakness (generalized)  Unsteadiness on feet     Problem List Patient Active Problem List   Diagnosis Date Noted  . Gait abnormality 10/04/2017  . Radiculopathy 08/30/2014  . Multiple sclerosis ([place]) 08/10/2014  . Diabetes mellitus without complication ([place])   . CVA (cerebral infarction)   . Thoracic or lumbosacral neuritis or radiculitis, unspecified   . Other and unspecified hyperlipidemia   . Encounter for long-term (current) use of other medications   . Unspecified late effects of cerebrovascular disease   . Unspecified sleep apnea   . Thrombotic cerebral infarction ([place]) 06/07/2013  . Dysarthria 06/01/2013  . Hypertension 06/01/2013    Rexanne Mano, PT 03/17/2018, 2:23 PM  [facility] [address]  [place], [place], [address] Phone: [phone]   Fax:  [phone]  Name: Thomas Nixon MRN: [id number] Date of Birth: [birth year]

## 2018-03-21 ENCOUNTER — Ambulatory Visit: Payer: Medicare PPO | Admitting: Physical Therapy

## 2018-03-21 ENCOUNTER — Encounter: Payer: Self-pay | Admitting: Physical Therapy

## 2018-03-21 DIAGNOSIS — R2689 Other abnormalities of gait and mobility: Secondary | ICD-10-CM | POA: Diagnosis not present

## 2018-03-21 DIAGNOSIS — M6281 Muscle weakness (generalized): Secondary | ICD-10-CM | POA: Diagnosis not present

## 2018-03-21 DIAGNOSIS — R2681 Unsteadiness on feet: Secondary | ICD-10-CM

## 2018-03-21 NOTE — Therapy (Signed)
[facility] [address] [place], [place], [address] Phone: [phone]   Fax:  [phone]  Physical Therapy Treatment  Patient Details  Name: Thomas Nixon MRN: [id number] Date of Birth: December 14, 1970 Referring Provider: Marcial Pacas, MD   Encounter Date: 03/21/2018  PT End of Session - 03/21/18 0935    Visit Number  9    Number of Visits  17    Date for PT Re-Evaluation  04/15/18    Authorization Type  [employer] MCR    Authorization Time Period  02/14/18 to 04/15/18    PT Start Time  0933    PT Stop Time  1015    PT Time Calculation (min)  42 min    Equipment Utilized During Treatment  Gait belt    Activity Tolerance  Patient tolerated treatment well    Behavior During Therapy  [facility] for tasks assessed/performed       Past Medical History:  Diagnosis Date  . CVA (cerebral infarction)   . Diabetes mellitus without complication (HCC)    Type 2  . Encounter for long-term (current) use of other medications   . Headache(784.0)   . Hypertension   . MS (multiple sclerosis) ([place])    possible  . Neuropathy   . Other and unspecified hyperlipidemia   . Stroke ([place])    5 strokes  . Thoracic or lumbosacral neuritis or radiculitis, unspecified   . Unspecified late effects of cerebrovascular disease   . Unspecified sleep apnea    does not use cpap    Past Surgical History:  Procedure Laterality Date  . ANTERIOR LAT LUMBAR FUSION N/A 08/30/2014   Procedure: ANTERIOR LATERAL LUMBAR FUSION 1 LEVEL;  Surgeon: Sinclair Ship, MD;  Location: [facility];  Service: Orthopedics;  Laterality: N/A;  Lumbar 3-4 lateral interbody fusion with instrumentation and allograft  . BRAIN BIOPSY      There were no vitals filed for this visit.  Subjective Assessment - 03/21/18 0933    Subjective  No new complaints. No falls or pain. Reports "feeling good" today.     Pertinent History  hypertension, hyperlipidemia, diabetes, back surgery 2015,  CVA x 5 (left field cut-resolved), Craniotomy for brain biopsy,     Patient Stated Goals  Strengthen my core and walk straight up    Currently in Pain?  No/denies    Pain Score  0-No pain          [facility] Adult PT Treatment/Exercise - 03/21/18 0937      Transfers   Transfers  Sit to Stand;Stand to Sit    Sit to Stand  7: Independent    Stand to Sit  7: Independent      Ambulation/Gait   Ambulation/Gait  Yes      High Level Balance   High Level Balance Activities  Backward walking;Direction changes;Side stepping;Tandem walking;Marching forwards;Marching backwards side stepping in squat position, toe walk fwd/bwd    High Level Balance Comments  on both red mats next to counter with intermittent touch for balance (more with bwd than fwd): 3-4 laps each with min guard assist to min assist for balance. cues on posture and ex form/technique.            Balance Exercises - 03/21/18 0952      Balance Exercises: Standing   SLS with Vectors  Foam/compliant surface;Other reps (comment);Limitations    Rockerboard  Anterior/posterior;Lateral;Head turns;EO;EC;30 seconds;10 reps      Balance Exercises: Standing   SLS  with Vectors Limitations  6 cones along edges of both red mats: alternating toe taps to each with side steppping left<>right, then alternating double toe taps to each with side stepping left<>right. 1 lap each/each way with HHA initially to no UE support, min guard assist to min assist for balance.      Rebounder Limitations  on balance board in corner with chair in front for safety: peformed both ways on board with occasional need to touch wall/chair for balance EC no head movements, progressing to EC head movements left<>right, up<>down and diagonals both ways. min assist with mostly posterior balance losses.                                                                        PT Short Term Goals - 03/17/18 9242      PT SHORT TERM GOAL #1   Title  Patient will be  independent with HEP (Target all STGs 03/16/2018)    Time  4    Period  Weeks    Status  Achieved      PT SHORT TERM GOAL #2   Title  Patient will improve FGA to >=20/30 demonstrating progress towards lower fall risk.    Baseline  02/14/18  17/30; 4/25 21/30    Time  4    Period  Weeks    Status  Achieved      PT SHORT TERM GOAL #3   Title  Patient will decrease 5x sit to stand to <=10.0 sec to demonstrate improved bil LE strengthening.     Baseline  4/25 8.94 sec    Time  4    Period  Weeks    Status  Achieved      PT SHORT TERM GOAL #4   Title  Patient will improve gait velocity to >=3.72 ft/sec (norm for age).    Baseline  03/17/18  3.58 ft/sec    Time  4    Period  Weeks    Status  Partially Met        PT Long Term Goals - 02/14/18 1048      PT LONG TERM GOAL #1   Title  Patient will be independent with updated HEP and able to verbalize his plan for continued community-based activity to maintain his gains. (Target all LTGs 04/15/18)    Time  8    Period  Weeks    Status  New    Target Date  04/15/18      PT LONG TERM GOAL #2   Title  Patient will improve FGA to >= 23/30 to demonstrate lesser fall risk.     Time  8    Period  Weeks    Status  New      PT LONG TERM GOAL #3   Title  Patient will ambulate modified independent x1000 ft outdoor over level and unlevel surfaces for safety with playing with dog/children on unlevel ground.     Time  8    Period  Weeks    Status  New      PT LONG TERM GOAL #4   Title  Patient will perform 5x sit to stand <=8.5 seconds demonstrating improved bil LE strength.    Time  8    Period  Weeks    Status  New            Plan - 03/21/18 0935    Clinical Impression Statement  Today's skilled session continued to focus on balance reactions on complaint surfaces and with vision removed. Pt continues to be challanged by these, needing UE support to min assist for balance. Pt is progressing toward goals and should benefit from  continued PT to progress toward unmet goals.     Rehab Potential  Good    Clinical Impairments Affecting Rehab Potential  decr short term memory    PT Frequency  2x / week    PT Duration  8 weeks    PT Treatment/Interventions  ADLs/Self Care Home Management;Gait training;Cognitive remediation;Neuromuscular re-education;Balance training;Therapeutic exercise;Therapeutic activities;Functional mobility training;Stair training;Patient/family education;Passive range of motion    PT Next Visit Plan   LE and core strengthening; gait and balance training--over compliant surfaces    PT Home Exercise Plan  quadruped "bird dog"; tall kneeling to 1/2 kneeling; plank x 15 seconds; sit to stand no UEs with lt foot on 4" block/book; heel/toe raises alternating; bridge with band; clam band    Consulted and Agree with Plan of Care  Patient       Patient will benefit from skilled therapeutic intervention in order to improve the following deficits and impairments:  Abnormal gait, Decreased balance, Decreased cognition, Decreased mobility, Decreased strength, Impaired tone, Postural dysfunction, Obesity  Visit Diagnosis: Muscle weakness (generalized)  Unsteadiness on feet  Other abnormalities of gait and mobility     Problem List Patient Active Problem List   Diagnosis Date Noted  . Gait abnormality 10/04/2017  . Radiculopathy 08/30/2014  . Multiple sclerosis ([place]) 08/10/2014  . Diabetes mellitus without complication ([place])   . CVA (cerebral infarction)   . Thoracic or lumbosacral neuritis or radiculitis, unspecified   . Other and unspecified hyperlipidemia   . Encounter for long-term (current) use of other medications   . Unspecified late effects of cerebrovascular disease   . Unspecified sleep apnea   . Thrombotic cerebral infarction ([place]) 06/07/2013  . Dysarthria 06/01/2013  . Hypertension 06/01/2013    Willow Ora, PTA, [facility] [address], [place], [place] [address] [phone] 03/21/18, 9:59 PM   Name: Thomas Nixon MRN: [id number] Date of Birth: 06/17/1971

## 2018-03-23 ENCOUNTER — Ambulatory Visit: Payer: Medicare PPO | Attending: Neurology | Admitting: Physical Therapy

## 2018-03-23 ENCOUNTER — Encounter: Payer: Self-pay | Admitting: Physical Therapy

## 2018-03-23 DIAGNOSIS — R29818 Other symptoms and signs involving the nervous system: Secondary | ICD-10-CM | POA: Diagnosis not present

## 2018-03-23 DIAGNOSIS — R2689 Other abnormalities of gait and mobility: Secondary | ICD-10-CM | POA: Diagnosis not present

## 2018-03-23 DIAGNOSIS — M6281 Muscle weakness (generalized): Secondary | ICD-10-CM | POA: Insufficient documentation

## 2018-03-23 DIAGNOSIS — R2681 Unsteadiness on feet: Secondary | ICD-10-CM

## 2018-03-23 NOTE — Therapy (Signed)
[facility] [address] [place], [place], [address] Phone: [phone]   Fax:  [phone]  Physical Therapy Treatment  Patient Details  Name: JULIA KULZER MRN: [id number] Date of Birth: 1971-11-08 Referring Provider: Marcial Pacas, MD   Encounter Date: 03/23/2018  PT End of Session - 03/23/18 0943    Visit Number  10    Number of Visits  17    Date for PT Re-Evaluation  04/15/18    Authorization Type  [employer] MCR    Authorization Time Period  02/14/18 to 04/15/18    PT Start Time  0942 pt running late today    PT Stop Time  1014    PT Time Calculation (min)  32 min    Equipment Utilized During Treatment  Gait belt    Activity Tolerance  Patient tolerated treatment well    Behavior During Therapy  [facility] for tasks assessed/performed       Past Medical History:  Diagnosis Date  . CVA (cerebral infarction)   . Diabetes mellitus without complication (HCC)    Type 2  . Encounter for long-term (current) use of other medications   . Headache(784.0)   . Hypertension   . MS (multiple sclerosis) ([place])    possible  . Neuropathy   . Other and unspecified hyperlipidemia   . Stroke ([employer])    5 strokes  . Thoracic or lumbosacral neuritis or radiculitis, unspecified   . Unspecified late effects of cerebrovascular disease   . Unspecified sleep apnea    does not use cpap    Past Surgical History:  Procedure Laterality Date  . ANTERIOR LAT LUMBAR FUSION N/A 08/30/2014   Procedure: ANTERIOR LATERAL LUMBAR FUSION 1 LEVEL;  Surgeon: Sinclair Ship, MD;  Location: [facility];  Service: Orthopedics;  Laterality: N/A;  Lumbar 3-4 lateral interbody fusion with instrumentation and allograft  . BRAIN BIOPSY      There were no vitals filed for this visit.  Subjective Assessment - 03/23/18 0943    Subjective  No new complaints. No falls or pain.     Pertinent History  hypertension, hyperlipidemia, diabetes, back surgery 2015, CVA x 5  (left field cut-resolved), Craniotomy for brain biopsy,     Patient Stated Goals  Strengthen my core and walk straight up    Currently in Pain?  No/denies    Pain Score  0-No pain          [facility] Adult PT Treatment/Exercise - 03/23/18 0944      Ambulation/Gait   Ambulation/Gait  Yes    Ambulation/Gait Assistance  5: Supervision;6: Modified independent (Device/Increase time)    Ambulation/Gait Assistance Details  occasional left toe scuffing noted, decreased stride length. pt with several stops during gait reporting he does this "when I feel I might fall"    Ambulation Distance (Feet)  500 Feet    Assistive device  None    Gait Pattern  Step-through pattern;Decreased stride length;Decreased arm swing - left;Decreased dorsiflexion - right;Decreased dorsiflexion - left;Narrow base of support;Decreased trunk rotation    Ambulation Surface  Level;Unlevel;Indoor;Outdoor;Paved;Gravel;Grass      High Level Balance   High Level Balance Activities  Marching forwards;Marching backwards;Tandem walking tandem/heel/toe walk fwd/bwd    High Level Balance Comments  on both red mats with no UE support: 3 laps each with cues on posture/ex form/technique. min guard assit with standing rest breaks taken as needed throughout.           Balance Exercises -  03/23/18 1006      Balance Exercises: Standing   Step Over Hurdles / Cones  hurdles of varied heights on both red mats: working toward reciprocal stepping over mats with light UE support. pt would revert back to step to pattern, needing cues for reciprocal steps. cues needed for increased hip/knee flexion and increased step length as well during this task.                            PT Short Term Goals - 03/17/18 1884      PT SHORT TERM GOAL #1   Title  Patient will be independent with HEP (Target all STGs 03/16/2018)    Time  4    Period  Weeks    Status  Achieved      PT SHORT TERM GOAL #2   Title  Patient will improve FGA to >=20/30  demonstrating progress towards lower fall risk.    Baseline  02/14/18  17/30; 4/25 21/30    Time  4    Period  Weeks    Status  Achieved      PT SHORT TERM GOAL #3   Title  Patient will decrease 5x sit to stand to <=10.0 sec to demonstrate improved bil LE strengthening.     Baseline  4/25 8.94 sec    Time  4    Period  Weeks    Status  Achieved      PT SHORT TERM GOAL #4   Title  Patient will improve gait velocity to >=3.72 ft/sec (norm for age).    Baseline  03/17/18  3.58 ft/sec    Time  4    Period  Weeks    Status  Partially Met        PT Long Term Goals - 02/14/18 1048      PT LONG TERM GOAL #1   Title  Patient will be independent with updated HEP and able to verbalize his plan for continued community-based activity to maintain his gains. (Target all LTGs 04/15/18)    Time  8    Period  Weeks    Status  New    Target Date  04/15/18      PT LONG TERM GOAL #2   Title  Patient will improve FGA to >= 23/30 to demonstrate lesser fall risk.     Time  8    Period  Weeks    Status  New      PT LONG TERM GOAL #3   Title  Patient will ambulate modified independent x1000 ft outdoor over level and unlevel surfaces for safety with playing with dog/children on unlevel ground.     Time  8    Period  Weeks    Status  New      PT LONG TERM GOAL #4   Title  Patient will perform 5x sit to stand <=8.5 seconds demonstrating improved bil LE strength.    Time  8    Period  Weeks    Status  New            Plan - 03/23/18 0944    Clinical Impression Statement  Today's skilled session continued to focus on gait on complaint surfaces and high level balance on compliant surfaces. Pt continues to need up to min guard assist/occasional UE support for balance when performing balance activities. Pt also demo'd several episodes of freezing like stepping pattern during session today, usually  starting or presented with obstacles in front of him he had to negotiate around. Unsure if this is  his norm as this is only this PTA second session with pt, will continue to monitor and discuss with primary PT. Pt is progressing toward goals and should benefit from continued PT to progress toward unmet goals.                     Rehab Potential  Good    Clinical Impairments Affecting Rehab Potential  decr short term memory    PT Frequency  2x / week    PT Duration  8 weeks    PT Treatment/Interventions  ADLs/Self Care Home Management;Gait training;Cognitive remediation;Neuromuscular re-education;Balance training;Therapeutic exercise;Therapeutic activities;Functional mobility training;Stair training;Patient/family education;Passive range of motion    PT Next Visit Plan   LE and core strengthening; gait and balance training--over compliant surfaces    PT Home Exercise Plan  quadruped "bird dog"; tall kneeling to 1/2 kneeling; plank x 15 seconds; sit to stand no UEs with lt foot on 4" block/book; heel/toe raises alternating; bridge with band; clam band    Consulted and Agree with Plan of Care  Patient       Patient will benefit from skilled therapeutic intervention in order to improve the following deficits and impairments:  Abnormal gait, Decreased balance, Decreased cognition, Decreased mobility, Decreased strength, Impaired tone, Postural dysfunction, Obesity  Visit Diagnosis: Muscle weakness (generalized)  Unsteadiness on feet  Other abnormalities of gait and mobility     Problem List Patient Active Problem List   Diagnosis Date Noted  . Gait abnormality 10/04/2017  . Radiculopathy 08/30/2014  . Multiple sclerosis ([place]) 08/10/2014  . Diabetes mellitus without complication ([place])   . CVA (cerebral infarction)   . Thoracic or lumbosacral neuritis or radiculitis, unspecified   . Other and unspecified hyperlipidemia   . Encounter for long-term (current) use of other medications   . Unspecified late effects of cerebrovascular disease   . Unspecified sleep apnea   . Thrombotic  cerebral infarction ([place]) 06/07/2013  . Dysarthria 06/01/2013  . Hypertension 06/01/2013    Willow Ora, PTA, [facility] [address] [place], [place] [address] [phone] 03/23/18, 12:59 PM   Name: HILLIARD BORGES MRN: [id number] Date of Birth: Sep 06, 1971

## 2018-03-28 ENCOUNTER — Encounter: Payer: Self-pay | Admitting: Physical Therapy

## 2018-03-28 ENCOUNTER — Ambulatory Visit: Payer: Medicare PPO | Admitting: Physical Therapy

## 2018-03-28 DIAGNOSIS — R29818 Other symptoms and signs involving the nervous system: Secondary | ICD-10-CM | POA: Diagnosis not present

## 2018-03-28 DIAGNOSIS — R2689 Other abnormalities of gait and mobility: Secondary | ICD-10-CM | POA: Diagnosis not present

## 2018-03-28 DIAGNOSIS — R2681 Unsteadiness on feet: Secondary | ICD-10-CM

## 2018-03-28 DIAGNOSIS — M6281 Muscle weakness (generalized): Secondary | ICD-10-CM | POA: Diagnosis not present

## 2018-03-28 NOTE — Therapy (Signed)
[facility] [address] [place], [place], [address] Phone: [phone]   Fax:  [phone]  Physical Therapy Treatment  Patient Details  Name: Thomas Nixon MRN: [id number] Date of Birth: 1971-07-05 Referring Provider: Marcial Pacas, MD   Encounter Date: 03/28/2018  PT End of Session - 03/28/18 1736    Visit Number  11    Number of Visits  17    Date for PT Re-Evaluation  04/15/18    Authorization Type  [employer] MCR    Authorization Time Period  02/14/18 to 04/15/18    PT Start Time  0937 late arrival    PT Stop Time  1015    PT Time Calculation (min)  38 min    Equipment Utilized During Treatment  Gait belt    Activity Tolerance  Patient tolerated treatment well    Behavior During Therapy  [facility] for tasks assessed/performed       Past Medical History:  Diagnosis Date  . CVA (cerebral infarction)   . Diabetes mellitus without complication (HCC)    Type 2  . Encounter for long-term (current) use of other medications   . Headache(784.0)   . Hypertension   . MS (multiple sclerosis) ([place])    possible  . Neuropathy   . Other and unspecified hyperlipidemia   . Stroke ([place])    5 strokes  . Thoracic or lumbosacral neuritis or radiculitis, unspecified   . Unspecified late effects of cerebrovascular disease   . Unspecified sleep apnea    does not use cpap    Past Surgical History:  Procedure Laterality Date  . ANTERIOR LAT LUMBAR FUSION N/A 08/30/2014   Procedure: ANTERIOR LATERAL LUMBAR FUSION 1 LEVEL;  Surgeon: Sinclair Ship, MD;  Location: [facility];  Service: Orthopedics;  Laterality: N/A;  Lumbar 3-4 lateral interbody fusion with instrumentation and allograft  . BRAIN BIOPSY      There were no vitals filed for this visit.  Subjective Assessment - 03/28/18 0939    Subjective  No new complaints. No falls or pain. States he feels he is getting stronger.     Pertinent History  hypertension, hyperlipidemia,  diabetes, back surgery 2015, CVA x 5 (left field cut-resolved), Craniotomy for brain biopsy,     Patient Stated Goals  Strengthen my core and walk straight up    Currently in Pain?  No/denies                       [facility] Adult PT Treatment/Exercise - 03/28/18 1317      Ambulation/Gait   Ambulation/Gait Assistance  5: Supervision;6: Modified independent (Device/Increase time)    Ambulation/Gait Assistance Details  vc for changes in velocity (supervision for safety, no imbalance or dragging left foot noted); Patient able to feel his gait improved with incr velocity compared to when going slower. States he goes slower as he is afraid he'll run into someone.     Ambulation Distance (Feet)  400 Feet    Assistive device  None    Gait Pattern  Step-through pattern;Decreased stride length;Decreased arm swing - left;Decreased dorsiflexion - right;Decreased dorsiflexion - left;Narrow base of support;Decreased trunk rotation all improved with incr in velocity    Ambulation Surface  Level;Indoor      High Level Balance   High Level Balance Activities  Side stepping;Backward walking;Tandem walking on red mats; combinations of stepping and tap cone; hurdles    High Level Balance Comments  on both red  mats no UE support; greater difficulty when standing or stepping with LLE      Lumbar Exercises: Seated   Long Arc Quad on [place]  Strengthening;Both;1 set;20 reps alternating legs, arms across chest    Hip Flexion on Ball  Strengthening;Both;10 reps alternating legs    Other Seated Lumbar Exercises  seated on physioball, unilateral shoulder flexion 3# x 20 reps          Balance Exercises - 03/28/18 0945      Balance Exercises: Standing   Retro Gait  Foam/compliant surface;4 reps red mat          PT Short Term Goals - 03/17/18 1771      PT SHORT TERM GOAL #1   Title  Patient will be independent with HEP (Target all STGs 03/16/2018)    Time  4    Period  Weeks    Status   Achieved      PT SHORT TERM GOAL #2   Title  Patient will improve FGA to >=20/30 demonstrating progress towards lower fall risk.    Baseline  02/14/18  17/30; 4/25 21/30    Time  4    Period  Weeks    Status  Achieved      PT SHORT TERM GOAL #3   Title  Patient will decrease 5x sit to stand to <=10.0 sec to demonstrate improved bil LE strengthening.     Baseline  4/25 8.94 sec    Time  4    Period  Weeks    Status  Achieved      PT SHORT TERM GOAL #4   Title  Patient will improve gait velocity to >=3.72 ft/sec (norm for age).    Baseline  03/17/18  3.58 ft/sec    Time  4    Period  Weeks    Status  Partially Met        PT Long Term Goals - 02/14/18 1048      PT LONG TERM GOAL #1   Title  Patient will be independent with updated HEP and able to verbalize his plan for continued community-based activity to maintain his gains. (Target all LTGs 04/15/18)    Time  8    Period  Weeks    Status  New    Target Date  04/15/18      PT LONG TERM GOAL #2   Title  Patient will improve FGA to >= 23/30 to demonstrate lesser fall risk.     Time  8    Period  Weeks    Status  New      PT LONG TERM GOAL #3   Title  Patient will ambulate modified independent x1000 ft outdoor over level and unlevel surfaces for safety with playing with dog/children on unlevel ground.     Time  8    Period  Weeks    Status  New      PT LONG TERM GOAL #4   Title  Patient will perform 5x sit to stand <=8.5 seconds demonstrating improved bil LE strength.    Time  8    Period  Weeks    Status  New            Plan - 03/28/18 1737    Clinical Impression Statement  Session focused on balance, strength, and gait training. Patient required vc througout session to engage his core for improved balance. Use of compliant surfaces challenging for patient, and with repetition he was able  to progress to performing tasks with only close supervision. Patient can continue to benefit from PT to work towards LTGs     Rehab Potential  Good    Clinical Impairments Affecting Rehab Potential  decr short term memory    PT Frequency  2x / week    PT Duration  8 weeks    PT Treatment/Interventions  ADLs/Self Care Home Management;Gait training;Cognitive remediation;Neuromuscular re-education;Balance training;Therapeutic exercise;Therapeutic activities;Functional mobility training;Stair training;Patient/family education;Passive range of motion    PT Next Visit Plan   Per pt goals work on core strengthening & gait--especially improving gait velocity, sudden turns, sudden stops, head turns, dual task;  balance training--over compliant surfaces    PT Home Exercise Plan  quadruped "bird dog"; tall kneeling to 1/2 kneeling; plank x 15 seconds; sit to stand no UEs with lt foot on 4" block/book; heel/toe raises alternating; bridge with band; clam band    Consulted and Agree with Plan of Care  Patient       Patient will benefit from skilled therapeutic intervention in order to improve the following deficits and impairments:  Abnormal gait, Decreased balance, Decreased cognition, Decreased mobility, Decreased strength, Impaired tone, Postural dysfunction, Obesity  Visit Diagnosis: Muscle weakness (generalized)  Unsteadiness on feet  Other abnormalities of gait and mobility     Problem List Patient Active Problem List   Diagnosis Date Noted  . Gait abnormality 10/04/2017  . Radiculopathy 08/30/2014  . Multiple sclerosis ([place]) 08/10/2014  . Diabetes mellitus without complication ([place])   . CVA (cerebral infarction)   . Thoracic or lumbosacral neuritis or radiculitis, unspecified   . Other and unspecified hyperlipidemia   . Encounter for long-term (current) use of other medications   . Unspecified late effects of cerebrovascular disease   . Unspecified sleep apnea   . Thrombotic cerebral infarction ([place]) 06/07/2013  . Dysarthria 06/01/2013  . Hypertension 06/01/2013    Rexanne Mano, PT 03/28/2018, 5:42  PM  [facility] [address] [place], [place], [address] Phone: [phone]   Fax:  [phone]  Name: Thomas Nixon MRN: [id number] Date of Birth: Feb 21, 1971

## 2018-03-30 ENCOUNTER — Ambulatory Visit: Payer: Medicare PPO | Admitting: Neurology

## 2018-03-30 ENCOUNTER — Ambulatory Visit: Payer: Medicare PPO | Admitting: Physical Therapy

## 2018-04-04 ENCOUNTER — Encounter: Payer: Self-pay | Admitting: Physical Therapy

## 2018-04-04 ENCOUNTER — Ambulatory Visit: Payer: Medicare PPO | Admitting: Physical Therapy

## 2018-04-04 DIAGNOSIS — R2689 Other abnormalities of gait and mobility: Secondary | ICD-10-CM

## 2018-04-04 DIAGNOSIS — M6281 Muscle weakness (generalized): Secondary | ICD-10-CM | POA: Diagnosis not present

## 2018-04-04 DIAGNOSIS — R29818 Other symptoms and signs involving the nervous system: Secondary | ICD-10-CM | POA: Diagnosis not present

## 2018-04-04 DIAGNOSIS — R2681 Unsteadiness on feet: Secondary | ICD-10-CM | POA: Diagnosis not present

## 2018-04-04 NOTE — Therapy (Signed)
[facility] [address] [place], [place], [address] Phone: [phone]   Fax:  [phone]  Physical Therapy Treatment  Patient Details  Name: Thomas Nixon MRN: [id number] Date of Birth: [birth year] Referring Provider: Marcial Pacas, MD   Encounter Date: 04/04/2018  PT End of Session - 04/04/18 1540    Visit Number  12    Number of Visits  17    Date for PT Re-Evaluation  04/15/18    Authorization Type  [employer] MCR    Authorization Time Period  02/14/18 to 04/15/18    PT Start Time  0938    PT Stop Time  1015    PT Time Calculation (min)  37 min    Equipment Utilized During Treatment  Gait belt    Activity Tolerance  Patient tolerated treatment well    Behavior During Therapy  [facility] for tasks assessed/performed       Past Medical History:  Diagnosis Date  . CVA (cerebral infarction)   . Diabetes mellitus without complication (HCC)    Type 2  . Encounter for long-term (current) use of other medications   . Headache(784.0)   . Hypertension   . MS (multiple sclerosis) ([place])    possible  . Neuropathy   . Other and unspecified hyperlipidemia   . Stroke ([place])    5 strokes  . Thoracic or lumbosacral neuritis or radiculitis, unspecified   . Unspecified late effects of cerebrovascular disease   . Unspecified sleep apnea    does not use cpap    Past Surgical History:  Procedure Laterality Date  . ANTERIOR LAT LUMBAR FUSION N/A 08/30/2014   Procedure: ANTERIOR LATERAL LUMBAR FUSION 1 LEVEL;  Surgeon: Sinclair Ship, MD;  Location: [place];  Service: Orthopedics;  Laterality: N/A;  Lumbar 3-4 lateral interbody fusion with instrumentation and allograft  . BRAIN BIOPSY      There were no vitals filed for this visit.  Subjective Assessment - 04/04/18 0940    Subjective  No new complaints. No falls or pain. States he has been walking outside up to 15 minutes at a time.    Pertinent History  hypertension,  hyperlipidemia, diabetes, back surgery 2015, CVA x 5 (left field cut-resolved), Craniotomy for brain biopsy,     Patient Stated Goals  Strengthen my core and walk straight up    Currently in Pain?  No/denies                       [facility] Adult PT Treatment/Exercise - 04/04/18 0945      Lumbar Exercises: Aerobic   Tread Mill  up to 2.5 mph; 8 minutes      Lumbar Exercises: Supine   Bridge  5 reps;5 seconds    Bridge with March  10 reps    Other Supine Lumbar Exercises  bridge with leg extension; bridge with lower calves on green physioball          Balance Exercises - 04/04/18 1518      Balance Exercises: Standing   Standing Eyes Opened  Narrow base of support (BOS);Head turns;Foam/compliant surface feet together; partial tandem,    Standing Eyes Closed  Narrow base of support (BOS);Head turns;Foam/compliant surface feet together; pratial tandem        PT Education - 04/04/18 1535    Education Details  update to HEP    Person(s) Educated  Patient    Methods  Explanation;Demonstration;Handout    Comprehension  Verbalized understanding;Returned demonstration       PT Short Term Goals - 03/17/18 4081      PT SHORT TERM GOAL #1   Title  Patient will be independent with HEP (Target all STGs 03/16/2018)    Time  4    Period  Weeks    Status  Achieved      PT SHORT TERM GOAL #2   Title  Patient will improve FGA to >=20/30 demonstrating progress towards lower fall risk.    Baseline  02/14/18  17/30; 4/25 21/30    Time  4    Period  Weeks    Status  Achieved      PT SHORT TERM GOAL #3   Title  Patient will decrease 5x sit to stand to <=10.0 sec to demonstrate improved bil LE strengthening.     Baseline  4/25 8.94 sec    Time  4    Period  Weeks    Status  Achieved      PT SHORT TERM GOAL #4   Title  Patient will improve gait velocity to >=3.72 ft/sec (norm for age).    Baseline  03/17/18  3.58 ft/sec    Time  4    Period  Weeks    Status  Partially Met         PT Long Term Goals - 02/14/18 1048      PT LONG TERM GOAL #1   Title  Patient will be independent with updated HEP and able to verbalize his plan for continued community-based activity to maintain his gains. (Target all LTGs 04/15/18)    Time  8    Period  Weeks    Status  New    Target Date  04/15/18      PT LONG TERM GOAL #2   Title  Patient will improve FGA to >= 23/30 to demonstrate lesser fall risk.     Time  8    Period  Weeks    Status  New      PT LONG TERM GOAL #3   Title  Patient will ambulate modified independent x1000 ft outdoor over level and unlevel surfaces for safety with playing with dog/children on unlevel ground.     Time  8    Period  Weeks    Status  New      PT LONG TERM GOAL #4   Title  Patient will perform 5x sit to stand <=8.5 seconds demonstrating improved bil LE strength.    Time  8    Period  Weeks    Status  New            Plan - 04/04/18 1553    Clinical Impression Statement  Session focused on gait training (focus on maintaining velocity and upright posture), balance training, and core strengthening. Patient's HEP updated due to his improving core strength. Patient continues to require cues to engage his core with balance activities and exercise. Patient continues to benefit from PT to achieve LTGs.     Rehab Potential  Good    Clinical Impairments Affecting Rehab Potential  decr short term memory    PT Frequency  2x / week    PT Duration  8 weeks    PT Treatment/Interventions  ADLs/Self Care Home Management;Gait training;Cognitive remediation;Neuromuscular re-education;Balance training;Therapeutic exercise;Therapeutic activities;Functional mobility training;Stair training;Patient/family education;Passive range of motion    PT Next Visit Plan   improving gait velocity, sudden turns, sudden stops, head turns, dual task; outdoors  vs over compliant surfaces; core strengthening    PT Home Exercise Plan  quadruped "bird dog"; tall  kneeling to 1/2 kneeling; plank x 15 seconds; sit to stand no UEs with lt foot on 4" block/book; heel/toe raises alternating; bridge with band; clam band; bridge with leg extension, bridge feet on physioball     Consulted and Agree with Plan of Care  Patient       Patient will benefit from skilled therapeutic intervention in order to improve the following deficits and impairments:  Abnormal gait, Decreased balance, Decreased cognition, Decreased mobility, Decreased strength, Impaired tone, Postural dysfunction, Obesity  Visit Diagnosis: Muscle weakness (generalized)  Other abnormalities of gait and mobility     Problem List Patient Active Problem List   Diagnosis Date Noted  . Gait abnormality 10/04/2017  . Radiculopathy 08/30/2014  . Multiple sclerosis ([place]) 08/10/2014  . Diabetes mellitus without complication ([place])   . CVA (cerebral infarction)   . Thoracic or lumbosacral neuritis or radiculitis, unspecified   . Other and unspecified hyperlipidemia   . Encounter for long-term (current) use of other medications   . Unspecified late effects of cerebrovascular disease   . Unspecified sleep apnea   . 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 Dysarthria 06/01/2013  . Hypertension 06/01/2013    Rexanne Mano, PT 04/04/2018, 4:24 PM  [facility] [address] [place], [place], [address] Phone: [phone]   Fax:  [phone]  Name: TRESHAUN CARRICO MRN: [id number] Date of Birth: February 10, 1971

## 2018-04-04 NOTE — Patient Instructions (Signed)
Gymball: Bridging (Double Leg)    Lie on back, arms across your chest, calves on ball. Slowly raise buttocks and count to 5.  Repeat __10_ times per set.  Do __1-2_ sets per session.     Bridging: with Straight Leg Raise    With legs bent, arms across the chest, lift buttocks and then slowly extend right knee, keeping stomach tight. Count out loud to 5. The switch legs and count out loud to 5. Then lower buttocks.  Repeat __10__ times per set. Do __1-2__ sets per session.

## 2018-04-06 ENCOUNTER — Encounter: Payer: Self-pay | Admitting: Physical Therapy

## 2018-04-06 ENCOUNTER — Ambulatory Visit: Payer: Medicare PPO | Admitting: Physical Therapy

## 2018-04-06 DIAGNOSIS — R29818 Other symptoms and signs involving the nervous system: Secondary | ICD-10-CM

## 2018-04-06 DIAGNOSIS — R2689 Other abnormalities of gait and mobility: Secondary | ICD-10-CM

## 2018-04-06 DIAGNOSIS — R2681 Unsteadiness on feet: Secondary | ICD-10-CM

## 2018-04-06 DIAGNOSIS — M6281 Muscle weakness (generalized): Secondary | ICD-10-CM | POA: Diagnosis not present

## 2018-04-06 NOTE — Therapy (Signed)
[facility] [address] [place], [place], [address] Phone: [phone]   Fax:  [phone]  Physical Therapy Treatment  Patient Details  Name: Thomas Nixon MRN: [id number] Date of Birth: 01-Jul-1971 Referring Provider: Marcial Pacas, MD   Encounter Date: 04/06/2018  PT End of Session - 04/06/18 1136    Visit Number  13    Number of Visits  17    Date for PT Re-Evaluation  04/15/18    Authorization Type  [employer] M[employer]    Authorization Time Period  02/14/18 to 04/15/18    PT Start Time  0936    PT Stop Time  1015    PT Time Calculation (min)  39 min    Equipment Utilized During Treatment  Gait belt    Activity Tolerance  Patient tolerated treatment well    Behavior During Therapy  [facility] for tasks assessed/performed       Past Medical History:  Diagnosis Date  . CVA (cerebral infarction)   . Diabetes mellitus without complication (HCC)    Type 2  . Encounter for long-term (current) use of other medications   . Headache(784.0)   . Hypertension   . MS (multiple sclerosis) ([place])    possible  . Neuropathy   . Other and unspecified hyperlipidemia   . Stroke ([place])    5 strokes  . Thoracic or lumbosacral neuritis or radiculitis, unspecified   . Unspecified late effects of cerebrovascular disease   . Unspecified sleep apnea    does not use cpap    Past Surgical History:  Procedure Laterality Date  . ANTERIOR LAT LUMBAR FUSION N/A 08/30/2014   Procedure: ANTERIOR LATERAL LUMBAR FUSION 1 LEVEL;  Surgeon: Sinclair Ship, MD;  Location: [facility];  Service: Orthopedics;  Laterality: N/A;  Lumbar 3-4 lateral interbody fusion with instrumentation and allograft  . BRAIN BIOPSY      There were no vitals filed for this visit.  Subjective Assessment - 04/06/18 0940    Subjective  No new complaints. No falls or pain. Reports he hesitates when walking due to fear of falling and being embarrassed.     Pertinent History   hypertension, hyperlipidemia, diabetes, back surgery 2015, CVA x 5 (left field cut-resolved), Craniotomy for brain biopsy,     Patient Stated Goals  Strengthen my core and walk straight up    Currently in Pain?  No/denies                       [facility] Adult PT Treatment/Exercise - 04/06/18 0001      Ambulation/Gait   Ambulation/Gait Assistance  5: Supervision    Ambulation/Gait Assistance Details  vc for left heel strike and incr left step length; relaxing UEs to allow natural swing; increasing velocity and feeling how much smoother his walking is; he again stutter steps and stops--reports this is due to his imagining himself falling and being embarrassed. Discussed trying to stop the negative images/thoughts and substitute positive thoughts (seeing himself walking smoothly) as his walking is much improved when he relaxes. also utilized taking deep breaths for relaxation prior to initiating walking    Ambulation Distance (Feet)  1000 Feet    Assistive device  None    Gait Pattern  Step-through pattern;Decreased stride length;Decreased arm swing - left;Decreased dorsiflexion - left;Narrow base of support;Decreased trunk rotation;Poor foot clearance - left    Ambulation Surface  Level;Unlevel;Indoor;Outdoor;Paved;Grass    Curb  6: Modified independent (Device/increase time)  incr time (slowed as approached)      Lumbar Exercises: Quadruped   Straight Leg Raise  5 reps;5 seconds with hamstring curl    Opposite Arm/Leg Raise  5 reps;Left arm/Right leg;Right arm/Left leg;5 seconds    Other Quadruped Lumbar Exercises  raise from quadruped to tall kneeling and slowly lower to quadruped again x 5 reps          Balance Exercises - 04/06/18 1134      Balance Exercises: Standing   Rockerboard  Anterior/posterior;Head turns;EO;EC 3# wts: shoulder flex, overhead press, scaption x 10 reps ea    Gait with Head Turns  Forward;Foam/compliant surface on grass          PT Short Term  Goals - 03/17/18 9417      PT SHORT TERM GOAL #1   Title  Patient will be independent with HEP (Target all STGs 03/16/2018)    Time  4    Period  Weeks    Status  Achieved      PT SHORT TERM GOAL #2   Title  Patient will improve FGA to >=20/30 demonstrating progress towards lower fall risk.    Baseline  02/14/18  17/30; 4/25 21/30    Time  4    Period  Weeks    Status  Achieved      PT SHORT TERM GOAL #3   Title  Patient will decrease 5x sit to stand to <=10.0 sec to demonstrate improved bil LE strengthening.     Baseline  4/25 8.94 sec    Time  4    Period  Weeks    Status  Achieved      PT SHORT TERM GOAL #4   Title  Patient will improve gait velocity to >=3.72 ft/sec (norm for age).    Baseline  03/17/18  3.58 ft/sec    Time  4    Period  Weeks    Status  Partially Met        PT Long Term Goals - 02/14/18 1048      PT LONG TERM GOAL #1   Title  Patient will be independent with updated HEP and able to verbalize his plan for continued community-based activity to maintain his gains. (Target all LTGs 04/15/18)    Time  8    Period  Weeks    Status  New    Target Date  04/15/18      PT LONG TERM GOAL #2   Title  Patient will improve FGA to >= 23/30 to demonstrate lesser fall risk.     Time  8    Period  Weeks    Status  New      PT LONG TERM GOAL #3   Title  Patient will ambulate modified independent x1000 ft outdoor over level and unlevel surfaces for safety with playing with dog/children on unlevel ground.     Time  8    Period  Weeks    Status  New      PT LONG TERM GOAL #4   Title  Patient will perform 5x sit to stand <=8.5 seconds demonstrating improved bil LE strength.    Time  8    Period  Weeks    Status  New            Plan - 04/06/18 1015    Clinical Impression Statement  Patient demonstrating improved ability to engage his core muscles and maintain his balance when working on unlevel or  compliant surfaces. Patient demonstrating improved quality  of gait with improved velocity when able to stay calm/relaxed and educated on techniques to practice to reduce fear of falling. Balance training also incorporated with pt doing well and additional praise offered and pointed out how much he has improved and should feel more confident when walking. Plan to finalize HEP next visit (update handout). Patient does not plan to join a gym or exercise class (due to financial constraints), but he and his wife and 3 kids have been walking in the evenings for incr exercise.     Rehab Potential  Good    Clinical Impairments Affecting Rehab Potential  decr short term memory    PT Frequency  2x / week    PT Duration  8 weeks    PT Treatment/Interventions  ADLs/Self Care Home Management;Gait training;Cognitive remediation;Neuromuscular re-education;Balance training;Therapeutic exercise;Therapeutic activities;Functional mobility training;Stair training;Patient/family education;Passive range of motion    PT Next Visit Plan  finalize HEP (copy into one handout);  improving gait velocity, sudden turns, sudden stops, head turns, dual task; outdoors vs over compliant surfaces; core strengthening    PT Home Exercise Plan  quadruped "bird dog"; tall kneeling to 1/2 kneeling; plank x 15 seconds; sit to stand no UEs with lt foot on 4" block/book; heel/toe raises alternating; bridge with band; clam band; bridge with leg extension, bridge feet on physioball     Consulted and Agree with Plan of Care  Patient       Patient will benefit from skilled therapeutic intervention in order to improve the following deficits and impairments:  Abnormal gait, Decreased balance, Decreased cognition, Decreased mobility, Decreased strength, Impaired tone, Postural dysfunction, Obesity  Visit Diagnosis: Muscle weakness (generalized)  Other abnormalities of gait and mobility  Unsteadiness on feet  Other symptoms and signs involving the nervous system     Problem List Patient Active  Problem List   Diagnosis Date Noted  . Gait abnormality 10/04/2017  . Radiculopathy 08/30/2014  . Multiple sclerosis ([place]) 08/10/2014  . Diabetes mellitus without complication ([place])   . CVA (cerebral infarction)   . Thoracic or lumbosacral neuritis or radiculitis, unspecified   . Other and unspecified hyperlipidemia   . Encounter for long-term (current) use of other medications   . Unspecified late effects of cerebrovascular disease   . Unspecified sleep apnea   . 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 Dysarthria 06/01/2013  . Hypertension 06/01/2013    Rexanne Mano, PT 04/06/2018, 11:42 AM  [facility] [address] [place], [place], [address] Phone: [phone]   Fax:  [phone]  Name: Thomas Nixon MRN: [id number] Date of Birth: 06/16/1971

## 2018-04-11 ENCOUNTER — Ambulatory Visit: Payer: Medicare PPO | Admitting: Physical Therapy

## 2018-04-11 ENCOUNTER — Encounter: Payer: Self-pay | Admitting: Physical Therapy

## 2018-04-11 DIAGNOSIS — R2689 Other abnormalities of gait and mobility: Secondary | ICD-10-CM | POA: Diagnosis not present

## 2018-04-11 DIAGNOSIS — R29818 Other symptoms and signs involving the nervous system: Secondary | ICD-10-CM | POA: Diagnosis not present

## 2018-04-11 DIAGNOSIS — R2681 Unsteadiness on feet: Secondary | ICD-10-CM | POA: Diagnosis not present

## 2018-04-11 DIAGNOSIS — M6281 Muscle weakness (generalized): Secondary | ICD-10-CM

## 2018-04-11 NOTE — Patient Instructions (Signed)
Bracing With Arm / Leg Raise (Quadruped)    On hands and knees tighten your stomach as if going to get punched (brace yourself). Alternating, lift arm to shoulder level and opposite leg to hip level. Hold for a count of 5. Then do the opposite arm and leg. Repeat __20_ times. Do _1__ times a day. *To increase challenge, add ankle weights or hold each position longer (work up to 10 seconds)   DEVELOPMENTAL POSITION: Tall Kneeling to Half Kneeling    Beside your coffee table or chair, start on both knees (tall kneeling position), shift weight to one side. Bring opposite leg forward, place foot flat on surface. Then return to tall kneeling. Repeat with other leg. __10_ reps per set, _1_ sets per day, __5_ days per week     Functional Quadriceps: Sit to Stand    Sit on edge of chair, feet flat on floor. Stand upright, extending knees fully. DO NOT PRESS BACKS OF LEGS AGAINST THE CHAIR! Slowly lower to sitting "light as a feather" Repeat __10__ times per set. Do __2__ sets per session. Do __1__ sessions per day.  *to increase challenge, find a lower seat, sofa, edge of bed. Can also stagger your feet (put one foot farther forward and the leg closest to you works harder).   Toe / Heel Raise    Stand at the counter with hands as light as possible. Gently rock back on heels and raise toes. Hold for 3 seconds. Then rock forward on toes and raise heels. Hold for 3 seconds. Repeat sequence __20__ times per session. Do ____ sessions per week.  *To increase challenge, decrease how much you are holding on to the counter until you don't use it at all.   Plank    Support body on hands and feet. Keep hips in line with torso and arms straight under chest. Hold for __30-60__ seconds and count out loud to not hold your breath.  Repeat 4 times total       Gymball: Bridging (Double Leg)    Lie on back, arms across your chest, calves on ball. Slowly raise buttocks and count to 5.    Repeat __10_ times per set.  Do __1-2_ sets per session.     Bridging: with Straight Leg Raise    With legs bent, arms across the chest, lift buttocks and then slowly extend right knee, keeping stomach tight. Count out loud to 5. The switch legs and count out loud to 5. Then lower buttocks.  Repeat __10__ times per set. Do __1-2__ sets per session.                 Zena Amos, PT4/11/2017 1:11 PM Signed

## 2018-04-11 NOTE — Therapy (Signed)
[facility] [address] [place], [place], [address] Phone: [phone]   Fax:  [phone]  Physical Therapy Treatment  Patient Details  Name: Thomas Nixon MRN: [id number] Date of Birth: 25-Jun-1971 Referring Provider: Marcial Pacas, MD   Encounter Date: 04/11/2018  PT End of Session - 04/11/18 1947    Visit Number  14    Number of Visits  17    Date for PT Re-Evaluation  04/15/18    Authorization Type  [employer] MCR    Authorization Time Period  02/14/18 to 04/15/18    PT Start Time  0938 late arrival    PT Stop Time  1014    PT Time Calculation (min)  36 min    Activity Tolerance  Patient tolerated treatment well    Behavior During Therapy  [facility] for tasks assessed/performed       Past Medical History:  Diagnosis Date  . CVA (cerebral infarction)   . Diabetes mellitus without complication (HCC)    Type 2  . Encounter for long-term (current) use of other medications   . Headache(784.0)   . Hypertension   . MS (multiple sclerosis) ([place])    possible  . Neuropathy   . Other and unspecified hyperlipidemia   . Stroke ([place])    5 strokes  . Thoracic or lumbosacral neuritis or radiculitis, unspecified   . Unspecified late effects of cerebrovascular disease   . Unspecified sleep apnea    does not use cpap    Past Surgical History:  Procedure Laterality Date  . ANTERIOR LAT LUMBAR FUSION N/A 08/30/2014   Procedure: ANTERIOR LATERAL LUMBAR FUSION 1 LEVEL;  Surgeon: Sinclair Ship, MD;  Location: [facility];  Service: Orthopedics;  Laterality: N/A;  Lumbar 3-4 lateral interbody fusion with instrumentation and allograft  . BRAIN BIOPSY      There were no vitals filed for this visit.  Subjective Assessment - 04/11/18 1935    Subjective  Reports there are some exercises from HEP that he rarely does (tall kneeling to 1/2 kneeling)    Pertinent History  hypertension, hyperlipidemia, diabetes, back surgery 2015, CVA x 5  (left field cut-resolved), Craniotomy for brain biopsy,     Patient Stated Goals  Strengthen my core and walk straight up                       [facility] Adult PT Treatment/Exercise - 04/11/18 1938      Ambulation/Gait   Ambulation/Gait Assistance  6: Modified independent (Device/Increase time)    Ambulation/Gait Assistance Details  vc for bil heelstrike to prevent trip/fall; varying speeds, turns, dual task thinking    Ambulation Distance (Feet)  120 Feet 60, 60    Assistive device  None    Gait Pattern  Step-through pattern;Decreased stride length;Decreased arm swing - left;Decreased dorsiflexion - left;Narrow base of support;Decreased trunk rotation;Poor foot clearance - left    Ambulation Surface  Level;Indoor    Stairs  Yes    Stairs Assistance  6: Modified independent (Device/Increase time)    Number of Stairs  4    Height of Stairs  6    Curb  5: Supervision    Curb Details (indicate cue type and reason)  descend       Posture/Postural Control   Posture/Postural Control  Postural limitations    Postural Limitations  Forward head    Posture Comments  vc throughout session  Balance Exercises - 04/11/18 1945      Balance Exercises: Standing   Rockerboard  Anterior/posterior;EO;EC;Head turns      See also exercises in HEP (pt instructions) with pt performing 5-10 reps of each.   PT Education - 04/11/18 1947    Education Details  updated HEP, including ways to advance exercises as they get too easy    Person(s) Educated  Patient;Spouse    Methods  Explanation;Demonstration;Handout    Comprehension  Verbalized understanding;Returned demonstration;Verbal cues required       PT Short Term Goals - 03/17/18 0938      PT SHORT TERM GOAL #1   Title  Patient will be independent with HEP (Target all STGs 03/16/2018)    Time  4    Period  Weeks    Status  Achieved      PT SHORT TERM GOAL #2   Title  Patient will improve FGA to >=20/30 demonstrating  progress towards lower fall risk.    Baseline  02/14/18  17/30; 4/25 21/30    Time  4    Period  Weeks    Status  Achieved      PT SHORT TERM GOAL #3   Title  Patient will decrease 5x sit to stand to <=10.0 sec to demonstrate improved bil LE strengthening.     Baseline  4/25 8.94 sec    Time  4    Period  Weeks    Status  Achieved      PT SHORT TERM GOAL #4   Title  Patient will improve gait velocity to >=3.72 ft/sec (norm for age).    Baseline  03/17/18  3.58 ft/sec    Time  4    Period  Weeks    Status  Partially Met        PT Long Term Goals - 02/14/18 1048      PT LONG TERM GOAL #1   Title  Patient will be independent with updated HEP and able to verbalize his plan for continued community-based activity to maintain his gains. (Target all LTGs 04/15/18)    Time  8    Period  Weeks    Status  New    Target Date  04/15/18      PT LONG TERM GOAL #2   Title  Patient will improve FGA to >= 23/30 to demonstrate lesser fall risk.     Time  8    Period  Weeks    Status  New      PT LONG TERM GOAL #3   Title  Patient will ambulate modified independent x1000 ft outdoor over level and unlevel surfaces for safety with playing with dog/children on unlevel ground.     Time  8    Period  Weeks    Status  New      PT LONG TERM GOAL #4   Title  Patient will perform 5x sit to stand <=8.5 seconds demonstrating improved bil LE strength.    Time  8    Period  Weeks    Status  New            Plan - 04/11/18 1949    Clinical Impression Statement  Session focused on finalizing/updating HEP patient wil continue upon discharge from PT. Next visit will check LTGs, answer final ?s and discharge from PT>    Rehab Potential  Good    Clinical Impairments Affecting Rehab Potential  decr short term memory    PT  Frequency  2x / week    PT Duration  8 weeks    PT Treatment/Interventions  ADLs/Self Care Home Management;Gait training;Cognitive remediation;Neuromuscular re-education;Balance  training;Therapeutic exercise;Therapeutic activities;Functional mobility training;Stair training;Patient/family education;Passive range of motion    PT Next Visit Plan  check LTGs and d/c    PT Home Exercise Plan  quadruped "bird dog"; tall kneeling to 1/2 kneeling; plank x 15 seconds; sit to stand no UEs with lt foot on 4" block/book; heel/toe raises alternating; bridge with band; clam band; bridge with leg extension, bridge feet on physioball     Consulted and Agree with Plan of Care  Patient       Patient will benefit from skilled therapeutic intervention in order to improve the following deficits and impairments:  Abnormal gait, Decreased balance, Decreased cognition, Decreased mobility, Decreased strength, Impaired tone, Postural dysfunction, Obesity  Visit Diagnosis: Muscle weakness (generalized)  Other abnormalities of gait and mobility  Unsteadiness on feet  Other symptoms and signs involving the nervous system     Problem List Patient Active Problem List   Diagnosis Date Noted  . Gait abnormality 10/04/2017  . Radiculopathy 08/30/2014  . Multiple sclerosis ([place]) 08/10/2014  . Diabetes mellitus without complication ([place])   . CVA (cerebral infarction)   . Thoracic or lumbosacral neuritis or radiculitis, unspecified   . Other and unspecified hyperlipidemia   . Encounter for long-term (current) use of other medications   . Unspecified late effects of cerebrovascular disease   . Unspecified sleep apnea   . 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 Dysarthria 06/01/2013  . Hypertension 06/01/2013    Rexanne Mano, PT 04/11/2018, 7:53 PM  [facility] [address] [place], [place], [address] Phone: [phone]   Fax:  [phone]  Name: Thomas Nixon MRN: [id number] Date of Birth: 06-19-1971

## 2018-04-12 DIAGNOSIS — E119 Type 2 diabetes mellitus without complications: Secondary | ICD-10-CM | POA: Diagnosis not present

## 2018-04-13 ENCOUNTER — Encounter: Payer: Self-pay | Admitting: Physical Therapy

## 2018-04-13 ENCOUNTER — Ambulatory Visit: Payer: Medicare PPO | Admitting: Physical Therapy

## 2018-04-13 DIAGNOSIS — M6281 Muscle weakness (generalized): Secondary | ICD-10-CM

## 2018-04-13 DIAGNOSIS — R2689 Other abnormalities of gait and mobility: Secondary | ICD-10-CM | POA: Diagnosis not present

## 2018-04-13 DIAGNOSIS — R2681 Unsteadiness on feet: Secondary | ICD-10-CM | POA: Diagnosis not present

## 2018-04-13 DIAGNOSIS — R29818 Other symptoms and signs involving the nervous system: Secondary | ICD-10-CM | POA: Diagnosis not present

## 2018-04-13 NOTE — Therapy (Signed)
[facility] [address] [place], [place], [address] Phone: [phone]   Fax:  [phone]  Physical Therapy Treatment  Patient Details  Name: Thomas Nixon MRN: [id number] Date of Birth: November 11, 1971 Referring Provider: Marcial Pacas, MD   Encounter Date: 04/13/2018  PT End of Session - 04/13/18 1724    Visit Number  15    Number of Visits  17    Date for PT Re-Evaluation  04/15/18    Authorization Type  [employer] MCR    Authorization Time Period  02/14/18 to 04/15/18    PT Start Time  0948 late arrival    PT Stop Time  1014    PT Time Calculation (min)  26 min    Activity Tolerance  Patient tolerated treatment well    Behavior During Therapy  [facility] for tasks assessed/performed       Past Medical History:  Diagnosis Date  . CVA (cerebral infarction)   . Diabetes mellitus without complication (HCC)    Type 2  . Encounter for long-term (current) use of other medications   . Headache(784.0)   . Hypertension   . MS (multiple sclerosis) ([place])    possible  . Neuropathy   . Other and unspecified hyperlipidemia   . Stroke ([place])    5 strokes  . Thoracic or lumbosacral neuritis or radiculitis, unspecified   . Unspecified late effects of cerebrovascular disease   . Unspecified sleep apnea    does not use cpap    Past Surgical History:  Procedure Laterality Date  . ANTERIOR LAT LUMBAR FUSION N/A 08/30/2014   Procedure: ANTERIOR LATERAL LUMBAR FUSION 1 LEVEL;  Surgeon: Sinclair Ship, MD;  Location: [facility];  Service: Orthopedics;  Laterality: N/A;  Lumbar 3-4 lateral interbody fusion with instrumentation and allograft  . BRAIN BIOPSY      There were no vitals filed for this visit.  Subjective Assessment - 04/13/18 1720    Subjective  Thankful for PT intervention. Feels more confident with his balance and walking.     Pertinent History  hypertension, hyperlipidemia, diabetes, back surgery 2015, CVA x 5 (left field  cut-resolved), Craniotomy for brain biopsy,     Patient Stated Goals  Strengthen my core and walk straight up    Currently in Pain?  No/denies         [facility] PT Assessment - 04/13/18 0951      Transfers   Five time sit to stand comments   10.6      Ambulation/Gait   Gait velocity  32.8/10.3=3.16 ft/sec       Functional Gait  Assessment   Gait Level Surface  Walks 20 ft in less than 7 sec but greater than 5.5 sec, uses assistive device, slower speed, mild gait deviations, or deviates 6-10 in outside of the 12 in walkway width. 6.68    Change in Gait Speed  Able to smoothly change walking speed without loss of balance or gait deviation. Deviate no more than 6 in outside of the 12 in walkway width.    Gait with Horizontal Head Turns  Performs head turns smoothly with no change in gait. Deviates no more than 6 in outside 12 in walkway width    Gait with Vertical Head Turns  Performs head turns with no change in gait. Deviates no more than 6 in outside 12 in walkway width.    Gait and Pivot Turn  Pivot turns safely within 3 sec and stops quickly with  no loss of balance.    Step Over Obstacle  Is able to step over 2 stacked shoe boxes taped together (9 in total height) without changing gait speed. No evidence of imbalance.    Gait with Narrow Base of Support  Ambulates 4-7 steps.    Gait with Eyes Closed  Walks 20 ft, uses assistive device, slower speed, mild gait deviations, deviates 6-10 in outside 12 in walkway width. Ambulates 20 ft in less than 9 sec but greater than 7 sec.    Ambulating Backwards  Walks 20 ft, no assistive devices, good speed, no evidence for imbalance, normal gait    Steps  Alternating feet, must use rail.    Total Score  25                   [facility] Adult PT Treatment/Exercise - 04/13/18 0951      Transfers   Transfers  Sit to Stand      Ambulation/Gait   Ambulation/Gait Assistance  6: Modified independent (Device/Increase time)    Ambulation/Gait  Assistance Details  slower pace, especially when approaching a change in surface or elevation    Ambulation Distance (Feet)  1000 Feet    Assistive device  None    Gait Pattern  Step-through pattern;Decreased stride length;Decreased arm swing - left;Narrow base of support;Decreased trunk rotation    Ambulation Surface  Level;Unlevel;Indoor;Outdoor;Paved;Grass pinestraw               PT Short Term Goals - 03/17/18 4098      PT SHORT TERM GOAL #1   Title  Patient will be independent with HEP (Target all STGs 03/16/2018)    Time  4    Period  Weeks    Status  Achieved      PT SHORT TERM GOAL #2   Title  Patient will improve FGA to >=20/30 demonstrating progress towards lower fall risk.    Baseline  02/14/18  17/30; 4/25 21/30    Time  4    Period  Weeks    Status  Achieved      PT SHORT TERM GOAL #3   Title  Patient will decrease 5x sit to stand to <=10.0 sec to demonstrate improved bil LE strengthening.     Baseline  4/25 8.94 sec    Time  4    Period  Weeks    Status  Achieved      PT SHORT TERM GOAL #4   Title  Patient will improve gait velocity to >=3.72 ft/sec (norm for age).    Baseline  03/17/18  3.58 ft/sec    Time  4    Period  Weeks    Status  Partially Met        PT Long Term Goals - 04/13/18 1725      PT LONG TERM GOAL #1   Title  Patient will be independent with updated HEP and able to verbalize his plan for continued community-based activity to maintain his gains. (Target all LTGs 04/15/18)    Baseline  cannot afford a gym membership; plans to continue walking and HEP    Time  8    Period  Weeks    Status  Achieved      PT LONG TERM GOAL #2   Title  Patient will improve FGA to >= 23/30 to demonstrate lesser fall risk.     Baseline  5/22   25/30    Time  8    Period  Weeks    Status  Achieved      PT LONG TERM GOAL #3   Title  Patient will ambulate modified independent x1000 ft outdoor over level and unlevel surfaces for safety with playing with  dog/children on unlevel ground.     Time  8    Period  Weeks    Status  Achieved      PT LONG TERM GOAL #4   Title  Patient will perform 5x sit to stand <=8.5 seconds demonstrating improved bil LE strength.    Baseline  10.6 sec    Time  8    Period  Weeks    Status  Not Met            Plan - 04/13/18 1727    Clinical Impression Statement  Patient arrived >15 minutes late for session. Pt met 3 of 4 LTGs, with 4th goal unmet and unchanged since STGs assessed (had improved during initial 4 weeks). Patient very please with his progress. States he feels more confident with his walking and his wife has commented that he is "looking more trim." Patient is discharged from PT    Rehab Potential  Good    Clinical Impairments Affecting Rehab Potential  decr short term memory    PT Frequency  2x / week    PT Duration  8 weeks    PT Treatment/Interventions  ADLs/Self Care Home Management;Gait training;Cognitive remediation;Neuromuscular re-education;Balance training;Therapeutic exercise;Therapeutic activities;Functional mobility training;Stair training;Patient/family education;Passive range of motion    PT Home Exercise Plan  quadruped "bird dog"; tall kneeling to 1/2 kneeling; plank x 15 seconds; sit to stand no UEs with lt foot on 4" block/book; heel/toe raises alternating; bridge with band; clam band; bridge with leg extension, bridge feet on physioball     Consulted and Agree with Plan of Care  Patient       Patient will benefit from skilled therapeutic intervention in order to improve the following deficits and impairments:  Abnormal gait, Decreased balance, Decreased cognition, Decreased mobility, Decreased strength, Impaired tone, Postural dysfunction, Obesity  Visit Diagnosis: Muscle weakness (generalized)  Other abnormalities of gait and mobility  Unsteadiness on feet     Problem List Patient Active Problem List   Diagnosis Date Noted  . Gait abnormality 10/04/2017  .  Radiculopathy 08/30/2014  . Multiple sclerosis ([place]) 08/10/2014  . Diabetes mellitus without complication ([place])   . CVA (cerebral infarction)   . Thoracic or lumbosacral neuritis or radiculitis, unspecified   . Other and unspecified hyperlipidemia   . Encounter for long-term (current) use of other medications   . Unspecified late effects of cerebrovascular disease   . Unspecified sleep apnea   . 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 Dysarthria 06/01/2013  . Hypertension 06/01/2013   PHYSICAL THERAPY DISCHARGE SUMMARY  Visits from Start of Care: 15  Current functional level related to goals / functional outcomes: Improved safety and confidence with gait and walking (modified independent over unlevel outdoor surfaces) See above re: LTGs achieved   Remaining deficits: Continues to decr his velocity and shorten his steps whenever he becomes cautious/afraid he's going to bump into someone and fall.    Education / Equipment: HEP  Plan: Patient agrees to discharge.  Patient goals were partially met. Patient is being discharged due to being pleased with the current functional level.  ?????      Rexanne Mano, PT 04/13/2018, 5:30 PM  [facility] [address]  [place], [place], [address] Phone: [phone]   Fax:  [phone]  Name: Thomas Nixon MRN: [id number] Date of Birth: 01-20-1971

## 2018-05-16 ENCOUNTER — Encounter: Payer: Self-pay | Admitting: Neurology

## 2018-05-16 ENCOUNTER — Ambulatory Visit: Payer: Medicare PPO | Admitting: Neurology

## 2018-05-16 VITALS — BP 138/92 | HR 68 | Ht 69.0 in | Wt 248.0 lb

## 2018-05-16 DIAGNOSIS — G35 Multiple sclerosis: Secondary | ICD-10-CM

## 2018-05-16 NOTE — Progress Notes (Signed)
PATIENT: Thomas Nixon DOB: October 22, 1971  Chief Complaint  Patient presents with  . Multiple Sclerosis    He is here with his wife, Thomas Nixon.  No new concerns today.  Feels he is doing well on Tedfidera.      HISTORICAL  Thomas Nixon  is a 47 years old male, seen in refer by  his primary care doctor Thomas Nixon for evaluation of gait abnormality, initial evaluation was on November twelfth 2018.  I reviewed and summarized referring note, he has history of hypertension, hyperlipidemia, diabetes, I saw him in 2011, lost follow-up, saw him again 2015, then lost follow-up again,  He had multiple strokelike episode in the past, started since his age twenties, he was treated at different medical facility, including [facility], [facility], [facility].  I have reviewed records from [facility] in 2002, with a diagnosis of acute demyelinating lesions, affecting both left, and right side of the brain, with severe left visual field cut, left neglect.  VEP was normal.   Lab showed normal or negative RPR, HIV, B12, folate acid, Lyme titer, ANA, TSH, protein electrophoresis, mild elevated CPK  MRI at [facility]: In 2014 has demonstrated extensive periventricular white matter disease, evidence of previous right parietal biopsy, hemosiderin deposit.  MRA of the brain was normal. ultrasound of carotid artery showed no significant large vessel disease.  Records from [facility], dated August 2009 by neurologist Thomas Nixon:  Laboratory evaluation,normaal fasting lipid profile,LDL was 53,  negative HIV, RPR,VDRL,  Athena Notch-3 Gene Mutation negative. norml CBC, INR,, CMP, other than mildly elevated Glu 128, A1C 6.4, TSH 1.5.  TTE: normal.  There were also mention of brain biopsy with demyelinating features, he did have improvement with steroid treatment, biopsy was read at [facility] clinic.  Over the past few years, he complains of slow worsening  gait difficulty, slurred speech, he denies visual trouble, he denies bowel and bladder incontinence.  Per records from [facility] orthopedic clinics, he has evidence of right lumbar radiculopathy, with MRI notable for bilateral L3 pars defect, 10 mm spondylolisthesis at L3 and 4, increased on flexion radiograph, the supported by positive EMG findings, he had lumbar decompression surgery by Thomas Nixon in 2015, which has helped his low back pain,   MRI lumbar in January 2017, status post L3-4 fusion, no significant canal foraminal stenosis  MRI of brain in September 2015, confluent periventricular and subcortical T2 hyperintensity, hemosiderin deposit in the right frontal cortical and bilateral parietal periventricular region, consistent with chronic intracerebral hemorrhage. Right frontal craniotomy and biopsy sequelae noted, compared to MRI in 2010, there is mild progression of white matter disease.  MRI cervical spine, mild degenerative changes, there is no evidence of intrinsic spinal cord disease  He lives with his wife, take care of the house chore, he noted mild increased gait abnormality, slurred speech, mild worsening memory loss  UPDATE Dec 09 2017: He is accompanied by his wife at today's clinical visit, he continue complains of mild gait abnormality, memory loss, we were able to review MRI of the brain with and without contrast in November 2018, there was evidence of confluent white matter T2/FLAIR hyperintensity signal in the right greater than left hemisphere, possibility include genetic demyelinating disease, inflammatory demyelinating disease,  But based on previous brain biopsy in 2009, there was demyelinating features, he has significant symptom improvement with steroid treatment, suggestive of inflammatory component, he never received any long-term immunomodulation therapy,  I will refer  him to lumbar puncture, he has missed multiple scheduled appointment in the past, we decided  to proceed with ocrelizumab if lumbar puncture showed typical findings of elevated oligoclonal banding consistent with MS,  Update February 07, 2018: He is accompanied by his wife at today's clinical visit, who has known him since 2009, Thomas Nixon did have gradual decline over the past 10 years, worsening memory loss, gait abnormality, most recent flareups was In 2014, he presented with left side stroke like event  JC virus titer 2.83 in Jan 2019, his insurance has limited coverage for ocrelizumab, he wants to explore other treatment options, Tysabri is not a good option due to high JC virus titer  Spinal fluid testing showed more than 5 oligoclonal banding,  Laboratory evaluation showed normal negative very long chain fatty acid, arylsufatase A activity.  UPDATE May 16 2018: He tolerated Tecfidera very well, started since March 2019, no significant side effect noticed, there is no change in his functional status, physical therapy has been very helpful, still has gait difficulty, today he was noted to have pseudobulbar expression  REVIEW OF SYSTEMS: Full 14 system review of systems performed and notable only for as above  ALLERGIES: Allergies  Allergen Reactions  . Pork-Derived Products Other (See Comments)    DIET RESTRICTIONS    HOME MEDICATIONS: Current Outpatient Medications  Medication Sig Dispense Refill  . amLODipine (NORVASC) 10 MG tablet Take 10 mg daily by mouth.  3  . atorvastatin (LIPITOR) 40 MG tablet Take 1 tablet (40 mg total) by mouth at bedtime. 30 tablet 1  . clopidogrel (PLAVIX) 75 MG tablet Take 75 mg daily by mouth.  1  . Dimethyl Fumarate (TECFIDERA) 240 MG CPDR Take 240 mg by mouth 2 (two) times daily.    . hydrochlorothiazide (HYDRODIURIL) 25 MG tablet Take 25 mg daily by mouth.  11  . irbesartan (AVAPRO) 300 MG tablet Take 300 mg daily by mouth.  11  . LYRICA 300 MG capsule Take 300 mg 2 (two) times daily by mouth.  2  . metFORMIN (GLUCOPHAGE) 500 MG tablet  Take 500 mg by mouth 2 (two) times daily with a meal.    . traMADol (ULTRAM) 50 MG tablet Take 50 mg 2 (two) times daily by mouth.  3   No current facility-administered medications for this visit.     PAST MEDICAL HISTORY: Past Medical History:  Diagnosis Date  . CVA (cerebral infarction)   . Diabetes mellitus without complication (HCC)    Type 2  . Encounter for long-term (current) use of other medications   . Headache(784.0)   . Hypertension   . MS (multiple sclerosis) (HCC)    possible  . Neuropathy   . Other and unspecified hyperlipidemia   . Stroke (HCC)    5 strokes  . Thoracic or lumbosacral neuritis or radiculitis, unspecified   . Unspecified late effects of cerebrovascular disease   . Unspecified sleep apnea    does not use cpap    PAST SURGICAL HISTORY: Past Surgical History:  Procedure Laterality Date  . ANTERIOR LAT LUMBAR FUSION N/A 08/30/2014   Procedure: ANTERIOR LATERAL LUMBAR FUSION 1 LEVEL;  Surgeon: Emilee Hero, MD;  Location: [facility] OR;  Service: Orthopedics;  Laterality: N/A;  Lumbar 3-4 lateral interbody fusion with instrumentation and allograft  . BRAIN BIOPSY      FAMILY HISTORY: Family History  Problem Relation Age of Onset  . High blood pressure Mother   . Diabetes Mother   .  High blood pressure Father   . Stroke Maternal Grandmother   . Stroke Paternal Grandmother     SOCIAL HISTORY:  Social History   Socioeconomic History  . Marital status: Married    Spouse name: Thomas Nixon  . Number of children: 3  . Years of education: college  . Highest education level: Not on file  Occupational History    Comment: Disabled  Social Needs  . Financial resource strain: Not on file  . Food insecurity:    Worry: Not on file    Inability: Not on file  . Transportation needs:    Medical: Not on file    Non-medical: Not on file  Tobacco Use  . Smoking status: Light Tobacco Smoker    Types: Cigars  . Smokeless tobacco: Never Used    Substance and Sexual Activity  . Alcohol use: Yes    Comment: Once per month.  . Drug use: No  . Sexual activity: Not on file  Lifestyle  . Physical activity:    Days per week: Not on file    Minutes per session: Not on file  . Stress: Not on file  Relationships  . Social connections:    Talks on phone: Not on file    Gets together: Not on file    Attends religious service: Not on file    Active member of club or organization: Not on file    Attends meetings of clubs or organizations: Not on file    Relationship status: Not on file  . Intimate partner violence:    Fear of current or ex partner: Not on file    Emotionally abused: Not on file    Physically abused: Not on file    Forced sexual activity: Not on file  Other Topics Concern  . Not on file  Social History Narrative   Patient is married and lives at home with his wife Thomas Nixon .   Disabled.   Education [employer].   Right handed.   1 cup caffeine per day.     PHYSICAL EXAM   Vitals:   05/16/18 1618  BP: (!) 138/92  Pulse: 68  Weight: 248 lb (112.5 kg)  Height: 5\' 9"  (1.753 m)    Not recorded      Body mass index is 36.62 kg/m.  PHYSICAL EXAMNIATION:  Gen: NAD, conversant, well nourised, obese, well groomed                     Cardiovascular: Regular rate rhythm, no peripheral edema, warm, nontender. Eyes: Conjunctivae clear without exudates or hemorrhage Neck: Supple, no carotid bruits. Pulmonary: Clear to auscultation bilaterally   NEUROLOGICAL EXAM:  MMSE - Mini Mental State Exam 02/07/2018 07/25/2014  Orientation to time 4 5  Orientation to Place 3 5  Registration 3 3  Attention/ Calculation 3 5  Recall 2 3  Language- name 2 objects 2 2  Language- repeat 1 1  Language- follow 3 step command 3 3  Language- read & follow direction 1 1  Write a sentence 1 1  Copy design 1 0  Total score 24 29     CRANIAL NERVES: CN II: Visual fields are full to confrontation. Fundoscopic exam is normal  with sharp discs and no vascular changes. Pupils are round equal and briskly reactive to light. CN III, IV, VI: extraocular movement are normal. No ptosis. CN V: Facial sensation is intact to pinprick in all 3 divisions bilaterally. Corneal responses are intact.  CN  VII: Face is symmetric with normal eye closure and smile. CN VIII: Hearing is normal to rubbing fingers CN IX, X: Palate elevates symmetrically. Phonation is normal. CN XI: Head turning and shoulder shrug are intact CN XII: Tongue is midline with normal movements and no atrophy.  MOTOR: He has mild fixation of right upper extremity power rapid rotating movement, mild right hip flexion ankle dorsiflexion weakness  REFLEXES: Reflexes are 2+ and symmetric at the biceps, triceps, knees, and ankles. Plantar responses are flexor.  SENSORY: Intact to light touch, pinprick, positional sensation and vibratory sensation are intact in fingers and toes.  COORDINATION: Rapid alternating movements and fine finger movements are intact. There is no dysmetria on finger-to-nose and heel-knee-shin.    GAIT/STANCE: . He needs push up to get up from seated position, wide based, cautious, dragging right leg Romberg is absent.   DIAGNOSTIC DATA (LABS, IMAGING, TESTING) - I reviewed patient records, labs, notes, testing and imaging myself where available.   ASSESSMENT AND PLAN  IHOR MEINZER is a 47 y.o. male    Gait abnormality  Continue moderate exercise  Relapsing remitting multiple sclerosis  MRI of the brain with and without contrast consistent with demyelinating disease,   CSF has more than 5 oligoclonal banding  JC virus titer was elevated 2.83, patient is not a good candidate for Tysab   started Tecfidera since March 2019, tolerating it very well  Check CBC with diff, CMP, TSH  Repeat MRi brain w/wo in Nov 2019     Levert Feinstein, M.D. Ph.D.  [facility] [address] [place], [place] [address] Ph:  [phone] Fax: [phone]  [id number], Jerelyn Scott, MD

## 2018-05-17 ENCOUNTER — Telehealth: Payer: Self-pay | Admitting: *Deleted

## 2018-05-17 ENCOUNTER — Telehealth: Payer: Self-pay | Admitting: Neurology

## 2018-05-17 LAB — COMPREHENSIVE METABOLIC PANEL
ALT: 30 IU/L (ref 0–44)
AST: 21 IU/L (ref 0–40)
Albumin/Globulin Ratio: 2 (ref 1.2–2.2)
Albumin: 4.7 g/dL (ref 3.5–5.5)
Alkaline Phosphatase: 35 IU/L — ABNORMAL LOW (ref 39–117)
BUN/Creatinine Ratio: 18 (ref 9–20)
BUN: 19 mg/dL (ref 6–24)
Bilirubin Total: 0.2 mg/dL (ref 0.0–1.2)
CO2: 25 mmol/L (ref 20–29)
Calcium: 9.2 mg/dL (ref 8.7–10.2)
Chloride: 104 mmol/L (ref 96–106)
Creatinine, Ser: 1.03 mg/dL (ref 0.76–1.27)
GFR calc Af Amer: 102 mL/min/{1.73_m2} (ref >59)
GFR calc non Af Amer: 88 mL/min/{1.73_m2} (ref >59)
Globulin, Total: 2.3 g/dL (ref 1.5–4.5)
Glucose: 127 mg/dL — ABNORMAL HIGH (ref 65–99)
Potassium: 3.9 mmol/L (ref 3.5–5.2)
Sodium: 142 mmol/L (ref 134–144)
Total Protein: 7 g/dL (ref 6.0–8.5)

## 2018-05-17 LAB — CBC WITH DIFFERENTIAL/PLATELET
Basophils Absolute: 0 10*3/uL (ref 0.0–0.2)
Basos: 0 %
EOS (ABSOLUTE): 0.1 10*3/uL (ref 0.0–0.4)
Eos: 1 %
Hematocrit: 41.9 % (ref 37.5–51.0)
Hemoglobin: 13.7 g/dL (ref 13.0–17.7)
Immature Grans (Abs): 0 10*3/uL (ref 0.0–0.1)
Immature Granulocytes: 0 %
Lymphocytes Absolute: 1.9 10*3/uL (ref 0.7–3.1)
Lymphs: 26 %
MCH: 27.9 pg (ref 26.6–33.0)
MCHC: 32.7 g/dL (ref 31.5–35.7)
MCV: 85 fL (ref 79–97)
Monocytes Absolute: 0.5 10*3/uL (ref 0.1–0.9)
Monocytes: 7 %
Neutrophils Absolute: 4.8 10*3/uL (ref 1.4–7.0)
Neutrophils: 66 %
Platelets: 270 10*3/uL (ref 150–450)
RBC: 4.91 x10E6/uL (ref 4.14–5.80)
RDW: 14.4 % (ref 12.3–15.4)
WBC: 7.3 10*3/uL (ref 3.4–10.8)

## 2018-05-17 LAB — TSH: TSH: 1.86 u[IU]/mL (ref 0.450–4.500)

## 2018-05-17 NOTE — Telephone Encounter (Signed)
Thomas Nixon: [id number] (exp. 05/17/18 to 06/16/18) order sent to GI. They will reach out to the pt to schedule.

## 2018-05-17 NOTE — Telephone Encounter (Signed)
Spoke to patient and provided him with lab results.  He verbalized understanding.

## 2018-05-17 NOTE — Telephone Encounter (Signed)
-----   Message from Levert Feinstein, MD sent at 05/17/2018  8:10 AM EDT ----- Please call patient: Laboratory evaluation showed mild elevated glucose 127, this can be related to the timing of last meal and the blood sample, rest of the laboratory evaluation showed no significant abnormality

## 2018-05-29 ENCOUNTER — Ambulatory Visit
Admission: RE | Admit: 2018-05-29 | Discharge: 2018-05-29 | Disposition: A | Discharge status: Home / Self Care | Payer: Medicare PPO | Source: Ambulatory Visit | Attending: Neurology | Admitting: Neurology

## 2018-05-29 DIAGNOSIS — G35 Multiple sclerosis: Secondary | ICD-10-CM | POA: Diagnosis not present

## 2018-05-29 MED ORDER — GADOBENATE DIMEGLUMINE 529 MG/ML IV SOLN
20.0000 mL | Freq: Once | INTRAVENOUS | Status: AC | PRN
  Administered 2018-05-29: 20 mL via INTRAVENOUS

## 2018-05-31 ENCOUNTER — Telehealth: Payer: Self-pay | Admitting: Neurology

## 2018-05-31 NOTE — Telephone Encounter (Signed)
Spoke to patient's wife on [place].  She is aware of the MRI results and will provide them to the patient.

## 2018-05-31 NOTE — Telephone Encounter (Signed)
MRI brain showed evidence of periventricular white matter disease bilaterally, there is hemosiderin deposit scattered through white matter abnormality, there is no acute findings,  IMPRESSION: This is an abnormal MRI of the brain with and without contrast showing the following: 1.    Large confluent foci involving the periventricular and deep white matter bilaterally.  Additionally, there is some hemosiderin deposition scattered within the white matter abnormalities.    The changes are most consistent with stroke.  Superimposed chronic demyelination cannot be ruled out. 2.    There are no acute findings and there is a normal enhancement pattern.

## 2018-08-25 DIAGNOSIS — I10 Essential (primary) hypertension: Secondary | ICD-10-CM | POA: Diagnosis not present

## 2018-08-25 DIAGNOSIS — E7800 Pure hypercholesterolemia, unspecified: Secondary | ICD-10-CM | POA: Diagnosis not present

## 2018-08-25 DIAGNOSIS — R269 Unspecified abnormalities of gait and mobility: Secondary | ICD-10-CM | POA: Diagnosis not present

## 2018-08-25 DIAGNOSIS — I639 Cerebral infarction, unspecified: Secondary | ICD-10-CM | POA: Diagnosis not present

## 2018-08-25 DIAGNOSIS — E1140 Type 2 diabetes mellitus with diabetic neuropathy, unspecified: Secondary | ICD-10-CM | POA: Diagnosis not present

## 2018-08-25 DIAGNOSIS — Z23 Encounter for immunization: Secondary | ICD-10-CM | POA: Diagnosis not present

## 2018-08-25 DIAGNOSIS — G35 Multiple sclerosis: Secondary | ICD-10-CM | POA: Diagnosis not present

## 2018-11-28 ENCOUNTER — Ambulatory Visit: Payer: Medicare PPO | Admitting: Neurology

## 2018-12-01 ENCOUNTER — Encounter: Payer: Self-pay | Admitting: Neurology

## 2018-12-01 ENCOUNTER — Ambulatory Visit: Payer: Medicare PPO | Admitting: Neurology

## 2018-12-01 VITALS — BP 152/94 | HR 72 | Ht 69.0 in | Wt 251.0 lb

## 2018-12-01 DIAGNOSIS — R269 Unspecified abnormalities of gait and mobility: Secondary | ICD-10-CM | POA: Diagnosis not present

## 2018-12-01 DIAGNOSIS — G35 Multiple sclerosis: Secondary | ICD-10-CM

## 2018-12-01 NOTE — Progress Notes (Signed)
PATIENT: Thomas Nixon DOB: 1971-09-27  Chief Complaint  Patient presents with  . Multiple Sclerosis    He is here with his wife, [name].  They would like to review his brain MRI.  Feels he is doing well on Tecfidera.     HISTORICAL  Thomas Nixon  is a 48 years old male, seen in refer by  his primary care doctor Georgann Housekeeper for evaluation of gait abnormality, initial evaluation was on November twelfth 2018.  I reviewed and summarized referring note, he has history of hypertension, hyperlipidemia, diabetes, I saw him in 2011, lost follow-up, saw him again 2015, then lost follow-up again,  He had multiple strokelike episode in the past, started since his age twenties, he was treated at different medical facility, including [facility], [facility], [facility].  I have reviewed records from [facility] in 2002, with a diagnosis of acute demyelinating lesions, affecting both left, and right side of the brain, with severe left visual field cut, left neglect.  VEP was normal.   Lab showed normal or negative RPR, HIV, B12, folate acid, Lyme titer, ANA, TSH, protein electrophoresis, mild elevated CPK  MRI at [facility]: In 2014 has demonstrated extensive periventricular white matter disease, evidence of previous right parietal biopsy, hemosiderin deposit.  MRA of the brain was normal. ultrasound of carotid artery showed no significant large vessel disease.  Records from [facility], dated August 2009 by neurologist Dr. Lillia Dallas:  Laboratory evaluation,normaal fasting lipid profile,LDL was 53,  negative HIV, RPR,VDRL,  Athena Notch-3 Gene Mutation negative. norml CBC, INR,, CMP, other than mildly elevated Glu 128, A1C 6.4, TSH 1.5.  TTE: normal.  There were also mention of brain biopsy with demyelinating features, he did have improvement with steroid treatment, biopsy was read at [facility] clinic.  Over the past few years, he complains of  slow worsening gait difficulty, slurred speech, he denies visual trouble, he denies bowel and bladder incontinence.  Per records from [facility] orthopedic clinics, he has evidence of right lumbar radiculopathy, with MRI notable for bilateral L3 pars defect, 10 mm spondylolisthesis at L3 and 4, increased on flexion radiograph, the supported by positive EMG findings, he had lumbar decompression surgery by Dr. Ottis Stain in 2015, which has helped his low back pain,   MRI lumbar in January 2017, status post L3-4 fusion, no significant canal foraminal stenosis  MRI of brain in September 2015, confluent periventricular and subcortical T2 hyperintensity, hemosiderin deposit in the right frontal cortical and bilateral parietal periventricular region, consistent with chronic intracerebral hemorrhage. Right frontal craniotomy and biopsy sequelae noted, compared to MRI in 2010, there is mild progression of white matter disease.  MRI cervical spine, mild degenerative changes, there is no evidence of intrinsic spinal cord disease  He lives with his wife, take care of the house chore, he noted mild increased gait abnormality, slurred speech, mild worsening memory loss  UPDATE Dec 09 2017: He is accompanied by his wife at today's clinical visit, he continue complains of mild gait abnormality, memory loss, we were able to review MRI of the brain with and without contrast in November 2018, there was evidence of confluent white matter T2/FLAIR hyperintensity signal in the right greater than left hemisphere, possibility include genetic demyelinating disease, inflammatory demyelinating disease,  But based on previous brain biopsy in 2009, there was demyelinating features, he has significant symptom improvement with steroid treatment, suggestive of inflammatory component, he never received any long-term immunomodulation therapy,  I will refer him to lumbar puncture, he has missed multiple scheduled appointment in the  past, we decided to proceed with ocrelizumab if lumbar puncture showed typical findings of elevated oligoclonal banding consistent with MS,  Update February 07, 2018: He is accompanied by his wife at today's clinical visit, who has known him since 2009, [name] did have gradual decline over the past 10 years, worsening memory loss, gait abnormality, most recent flareups was In 2014, he presented with left side stroke like event  JC virus titer 2.83 in Jan 2019, his insurance has limited coverage for ocrelizumab, he wants to explore other treatment options, Tysabri is not a good option due to high JC virus titer  Spinal fluid testing showed more than 5 oligoclonal banding,  Laboratory evaluation showed normal negative very long chain fatty acid, arylsufatase A activity.  UPDATE May 16 2018: He tolerated Tecfidera very well, started since March 2019, no significant side effect noticed, there is no change in his functional status, physical therapy has been very helpful, still has gait difficulty, today he was noted to have pseudobulbar expression  UPDATE Jan 9th 2020: He seems to walk better, he tolerate Tecfidera bid, physical therapy was helpful,  We personally reviewed MRI of the brain with and without contrast in July 2019, no significant change compared to previous scan in 2018, confluent foci involving periventricular deep white matter is, in addition, there is some hemosiderin deposition scattered within the white matter,  REVIEW OF SYSTEMS: Full 14 system review of systems performed and notable only for speech difficulty, decreased concentration, anxiety, memory loss  ALLERGIES: Allergies  Allergen Reactions  . Pork-Derived Products Other (See Comments)    DIET RESTRICTIONS    HOME MEDICATIONS: Current Outpatient Medications  Medication Sig Dispense Refill  . amLODipine (NORVASC) 10 MG tablet Take 10 mg daily by mouth.  3  . atorvastatin (LIPITOR) 40 MG tablet Take 1 tablet (40  mg total) by mouth at bedtime. 30 tablet 1  . clopidogrel (PLAVIX) 75 MG tablet Take 75 mg daily by mouth.  1  . Dimethyl Fumarate (TECFIDERA) 240 MG CPDR Take 240 mg by mouth 2 (two) times daily.    . hydrochlorothiazide (HYDRODIURIL) 25 MG tablet Take 25 mg daily by mouth.  11  . irbesartan (AVAPRO) 300 MG tablet Take 300 mg daily by mouth.  11  . LYRICA 300 MG capsule Take 300 mg 2 (two) times daily by mouth.  2  . metFORMIN (GLUCOPHAGE) 500 MG tablet Take 500 mg by mouth 2 (two) times daily with a meal.    . traMADol (ULTRAM) 50 MG tablet Take 50 mg 2 (two) times daily by mouth.  3   No current facility-administered medications for this visit.     PAST MEDICAL HISTORY: Past Medical History:  Diagnosis Date  . CVA (cerebral infarction)   . Diabetes mellitus without complication (HCC)    Type 2  . Encounter for long-term (current) use of other medications   . Headache(784.0)   . Hypertension   . MS (multiple sclerosis) (HCC)    possible  . Neuropathy   . Other and unspecified hyperlipidemia   . Stroke (HCC)    5 strokes  . Thoracic or lumbosacral neuritis or radiculitis, unspecified   . Unspecified late effects of cerebrovascular disease   . Unspecified sleep apnea    does not use cpap    PAST SURGICAL HISTORY: Past Surgical History:  Procedure Laterality Date  . ANTERIOR LAT LUMBAR FUSION  N/A 08/30/2014   Procedure: ANTERIOR LATERAL LUMBAR FUSION 1 LEVEL;  Surgeon: Emilee HeroMark Leonard Dumonski, MD;  Location: [facility] OR;  Service: Orthopedics;  Laterality: N/A;  Lumbar 3-4 lateral interbody fusion with instrumentation and allograft  . BRAIN BIOPSY      FAMILY HISTORY: Family History  Problem Relation Age of Onset  . High blood pressure Mother   . Diabetes Mother   . High blood pressure Father   . Stroke Maternal Grandmother   . Stroke Paternal Grandmother     SOCIAL HISTORY:  Social History   Socioeconomic History  . Marital status: Married    Spouse name: Aram BeechamCynthia  .  Number of children: 3  . Years of education: college  . Highest education level: Not on file  Occupational History    Comment: Disabled  Social Needs  . Financial resource strain: Not on file  . Food insecurity:    Worry: Not on file    Inability: Not on file  . Transportation needs:    Medical: Not on file    Non-medical: Not on file  Tobacco Use  . Smoking status: Light Tobacco Smoker    Types: Cigars  . Smokeless tobacco: Never Used  Substance and Sexual Activity  . Alcohol use: Yes    Comment: Once per month.  . Drug use: No  . Sexual activity: Not on file  Lifestyle  . Physical activity:    Days per week: Not on file    Minutes per session: Not on file  . Stress: Not on file  Relationships  . Social connections:    Talks on phone: Not on file    Gets together: Not on file    Attends religious service: Not on file    Active member of club or organization: Not on file    Attends meetings of clubs or organizations: Not on file    Relationship status: Not on file  . Intimate partner violence:    Fear of current or ex partner: Not on file    Emotionally abused: Not on file    Physically abused: Not on file    Forced sexual activity: Not on file  Other Topics Concern  . Not on file  Social History Narrative   Patient is married and lives at home with his wife Aram BeechamCynthia .   Disabled.   [employer].   Right handed.   1 cup caffeine per day.     PHYSICAL EXAM   Vitals:   12/01/18 1025  BP: (!) 152/94  Pulse: 72  Weight: 251 lb (113.9 kg)  Height: 5\' 9"  (1.753 m)    Not recorded      Body mass index is 37.07 kg/m.  PHYSICAL EXAMNIATION:  Gen: NAD, conversant, well nourised, obese, well groomed                     Cardiovascular: Regular rate rhythm, no peripheral edema, warm, nontender. Eyes: Conjunctivae clear without exudates or hemorrhage Neck: Supple, no carotid bruits. Pulmonary: Clear to auscultation bilaterally   NEUROLOGICAL  EXAM:  MMSE - Mini Mental State Exam 02/07/2018 07/25/2014  Orientation to time 4 5  Orientation to Place 3 5  Registration 3 3  Attention/ Calculation 3 5  Recall 2 3  Language- name 2 objects 2 2  Language- repeat 1 1  Language- follow 3 step command 3 3  Language- read & follow direction 1 1  Write a sentence 1 1  Copy design 1  0  Total score 24 29     CRANIAL NERVES: CN II: Visual fields are full to confrontation.  Pupils are round equal and briskly reactive to light. CN III, IV, VI: extraocular movement are normal. No ptosis. CN V: Facial sensation is intact to pinprick in all 3 divisions bilaterally. Corneal responses are intact.  CN VII: Face is symmetric with normal eye closure and smile. CN VIII: Hearing is normal to rubbing fingers CN IX, X: Palate elevates symmetrically. Phonation is normal. CN XI: Head turning and shoulder shrug are intact CN XII: Tongue is midline with normal movements and no atrophy.  MOTOR: He has mild fixation of right upper extremity power rapid rotating movement, mild right hip flexion ankle dorsiflexion weakness  REFLEXES: Reflexes are 2+ and symmetric at the biceps, triceps, knees, and ankles. Plantar responses are flexor.  SENSORY: Intact to light touch, pinprick, positional sensation and vibratory sensation are intact in fingers and toes.  COORDINATION: Rapid alternating movements and fine finger movements are intact. There is no dysmetria on finger-to-nose and heel-knee-shin.    GAIT/STANCE:  He needs push up to get up from seated position, wide based, cautious, dragging right leg Romberg is absent.   DIAGNOSTIC DATA (LABS, IMAGING, TESTING) - I reviewed patient records, labs, notes, testing and imaging myself where available.   ASSESSMENT AND PLAN  KIVEN VANGILDER is a 48 y.o. male    Gait abnormality  Continue moderate exercise  Refer him to physical therapy  Abnormal MRI of brain  Large confluent foci involving  periventricular and deep white matter bilaterally, in addition there is some hemosiderin deposition scattered within the white matter,  Possible relapsing remitting multiple sclerosis  CSF has more than 5 oligoclonal banding  JC virus titer was elevated 2.83, patient is not a good candidate for Tysab   started Tecfidera since March 2019, tolerating it very well  Check CBC with diff, CMP   Return to clinic in 6 months, repeat MRI of the brain with and without contrast again July 2020     Levert Feinstein, M.D. Ph.D.  [facility] [address], [address] [place], [place] [address] Ph: [phone] Fax: [phone]  [id number], Jerelyn Scott, MD

## 2018-12-16 ENCOUNTER — Encounter: Payer: Self-pay | Admitting: Physical Therapy

## 2018-12-16 ENCOUNTER — Other Ambulatory Visit: Payer: Self-pay

## 2018-12-16 ENCOUNTER — Ambulatory Visit: Payer: Medicare PPO | Attending: Neurology | Admitting: Physical Therapy

## 2018-12-16 DIAGNOSIS — R2689 Other abnormalities of gait and mobility: Secondary | ICD-10-CM | POA: Diagnosis not present

## 2018-12-16 DIAGNOSIS — R2681 Unsteadiness on feet: Secondary | ICD-10-CM | POA: Diagnosis not present

## 2018-12-16 DIAGNOSIS — R29818 Other symptoms and signs involving the nervous system: Secondary | ICD-10-CM

## 2018-12-16 NOTE — Patient Instructions (Signed)
Access Code: [email or web address]  URL: [email or web address]  Date: 12/16/2018  Prepared by: Veda Canning   Exercises  Supine Figure 4 Piriformis Stretch - 3 reps - 1 sets - 30 seconds hold - 1-2x daily - 7x weekly  Supine Piriformis Stretch - 3 reps - 1 sets - 30 sec hold - 1-2x daily - 7x weekly

## 2018-12-16 NOTE — Therapy (Signed)
[facility] [address] [place], [place], [address] Phone: [phone]   Fax:  [phone]  Physical Therapy Evaluation  Patient Details  Name: Thomas Nixon MRN: [id number] Date of Birth: [birth year] Referring Provider (PT):  Levert FeinsteinYan, Yijun, MD   Encounter Date: 12/16/2018  PT End of Session - 12/16/18 1225    Visit Number  1    Number of Visits  7    Date for PT Re-Evaluation  02/03/19   allowing 1 wk for pre-authorization, plus 6 wks of PT   Authorization Type  [employer] (requires pre-authorization for treatment)    PT Start Time  1102    PT Stop Time  1146    PT Time Calculation (min)  44 min    Activity Tolerance  Patient tolerated treatment well    Behavior During Therapy  [facility] for tasks assessed/performed       Past Medical History:  Diagnosis Date  . CVA (cerebral infarction)   . Diabetes mellitus without complication (HCC)    Type 2  . Encounter for long-term (current) use of other medications   . Headache(784.0)   . Hypertension   . MS (multiple sclerosis) (HCC)    possible  . Neuropathy   . Other and unspecified hyperlipidemia   . Stroke (HCC)    5 strokes  . Thoracic or lumbosacral neuritis or radiculitis, unspecified   . Unspecified late effects of cerebrovascular disease   . Unspecified sleep apnea    does not use cpap    Past Surgical History:  Procedure Laterality Date  . ANTERIOR LAT LUMBAR FUSION N/A 08/30/2014   Procedure: ANTERIOR LATERAL LUMBAR FUSION 1 LEVEL;  Surgeon: Thomas HeroMark Leonard Dumonski, MD;  Location: [facility] OR;  Service: Orthopedics;  Laterality: N/A;  Lumbar 3-4 lateral interbody fusion with instrumentation and allograft  . BRAIN BIOPSY      There were no vitals filed for this visit.   Subjective Assessment - 12/16/18 1107    Subjective  Goes by "Minerva AreolaEric" The doctor told me I need to lose some weight. I haven't been walking like I should since it has gotten cold outside.  Denies falls. Is still doing some exercises from last episode of PT (ended Apr 13, 2018).     Pertinent History  PMH-hypertension, hyperlipidemia, diabetes, back surgery 2015, CVA x 5, Craniotomy for brain biopsy, severe left visual field cut, left neglect.    Patient Stated Goals  Get back on an exercise routine and continue to lose weight.     Currently in Pain?  No/denies         [facility] PT Assessment - 12/16/18 1112      Assessment   Medical Diagnosis  MS, gait abnormality    Referring Provider (PT)   Levert FeinsteinYan, Yijun, MD    Onset Date/Surgical Date  --   MD referral 01/09     Balance Screen   Has the patient fallen in the past 6 months  No      Home Environment   Living Environment  Private residence    Living Arrangements  Spouse/significant other;Children   4 children   Available Help at Discharge  Family;Available PRN/intermittently    Type of Home  House    Home Access  Level entry    Home Layout  Two level;Able to live on main level with bedroom/bathroom    Alternate Level Stairs-Number of Steps  12    Alternate Level Stairs-Rails  Right  Home Equipment  Carlyss - single point;Walker - 2 wheels      Prior Function   Level of Independence  Independent    Leisure  play with kids 5, 9, 10      Cognition   Overall Cognitive Status  Within Functional Limits for tasks assessed      Ambulation/Gait   Gait velocity  32.8/8.94=3.67 ft/sec      Functional Gait  Assessment   Gait assessed   Yes    Gait Level Surface  Walks 20 ft in less than 7 sec but greater than 5.5 sec, uses assistive device, slower speed, mild gait deviations, or deviates 6-10 in outside of the 12 in walkway width.   6.6 sec   Change in Gait Speed  Able to smoothly change walking speed without loss of balance or gait deviation. Deviate no more than 6 in outside of the 12 in walkway width.    Gait with Horizontal Head Turns  Performs head turns smoothly with slight change in gait velocity (eg, minor disruption  to smooth gait path), deviates 6-10 in outside 12 in walkway width, or uses an assistive device.    Gait with Vertical Head Turns  Performs head turns with no change in gait. Deviates no more than 6 in outside 12 in walkway width.    Gait and Pivot Turn  Pivot turns safely within 3 sec and stops quickly with no loss of balance.    Step Over Obstacle  Is able to step over 2 stacked shoe boxes taped together (9 in total height) without changing gait speed. No evidence of imbalance.    Gait with Narrow Base of Support  Ambulates less than 4 steps heel to toe or cannot perform without assistance.   0   Gait with Eyes Closed  Walks 20 ft, slow speed, abnormal gait pattern, evidence for imbalance, deviates 10-15 in outside 12 in walkway width. Requires more than 9 sec to ambulate 20 ft.   8.68 sec; 24" to left   Ambulating Backwards  Walks 20 ft, uses assistive device, slower speed, mild gait deviations, deviates 6-10 in outside 12 in walkway width.    Steps  Alternating feet, no rail.    Total Score  22                Objective measurements completed on examination: See above findings.              PT Education - 12/16/18 1224    Education Details  reports his RLE "sciatica" is bothering him more recently; educated on piriformis stretch to begin    Person(s) Educated  Patient    Methods  Explanation;Demonstration;Tactile cues;Verbal cues;Handout    Comprehension  Verbalized understanding;Returned demonstration;Verbal cues required;Tactile cues required;Need further instruction       PT Short Term Goals - 12/16/18 1915      PT SHORT TERM GOAL #1   Title  Patient will be independent with initial HEP and investigated how he can continue aerobic activity through the winter (Target all STGs 01/13/2019)    Time  3    Period  Weeks    Status  New      PT SHORT TERM GOAL #2   Title  Patient will improve FGA to >=25/30 demonstrating progress towards lower fall risk.    Baseline   12/16/18  22/30    Time  3    Period  Weeks    Status  New  PT SHORT TERM GOAL #3   Title  Patient will decrease 5x sit to stand to <=10.0 sec to demonstrate improved bil LE strengthening.     Baseline  12/16/18 12.38 sec    Time  3    Period  Weeks    Status  New        PT Long Term Goals - 12/16/18 1917      PT LONG TERM GOAL #1   Title  Patient will be independent with updated HEP and able to verbalize his plan for continued community-based activity to maintain his gains. (Target all LTGs 02/03/2019)    Time  6    Period  Weeks    Status  New    Target Date  02/03/19      PT LONG TERM GOAL #2   Title  Patient will perform 5x sit to stand <=8.5 seconds demonstrating improved bil LE strength.    Time  6    Period  Weeks    Status  New      PT LONG TERM GOAL #3   Title  Patient will tolerate a minimum of 15 minutes of aerobic activity maintaining his HR between 60-80% of his max HR (175 bpm; 60%=105 bpm, 80%=140 bpm)    Time  6    Period  Weeks    Status  New      PT LONG TERM GOAL #4   Title  Patient will demonstrate ability to check his own pulse/HR (or obtain a device that will monitor it for him)    Time  6    Period  Weeks    Status  New             Plan - 12/16/18 1841    Clinical Impression Statement  Patient referred by his neurologist for OPPT due to worsening gait abnormality. Pt reports the MD also discussed desire for pt to develop exercise routine to help him lose weight. Patient has had a decline in outcome measures since discharged from OPPT in May 2019 (5x sit to stand was 10.6 sec, now 12.4; FGA was 25/30 now 22/30. Patient moved to a new house since discharged and has been unable to find his exercise program (does what he could remember) and with onset of winter, has not been walking outside for exercise. He is eager to resume a program and should make quick progress. Anticipate he can benefit from a short course of PT (including the  interventions listed below) to address the deficits listed below and re-establish his exercise program.     History and Personal Factors relevant to plan of care:  Personal factors- decreased short-term memory (will provide written information to improve compliance with HEP); expected slow progression due to MS diagnosis     Clinical Presentation  Stable    Clinical Presentation due to:  no recent flare up    Clinical Decision Making  Low    Rehab Potential  Good    Clinical Impairments Affecting Rehab Potential  decr memory/cognition--will assist pt to develop strategies to recall program and technique    PT Frequency  1x / week    PT Duration  6 weeks    PT Treatment/Interventions  ADLs/Self Care Home Management;Gait training;Stair training;Functional mobility training;Therapeutic activities;Therapeutic exercise;Balance training;Patient/family education;Cognitive remediation;Neuromuscular re-education;Energy conservation;Passive range of motion    PT Next Visit Plan  warm-up on treadmill (up to 10 minutes); intitiate HEP (may pull from HEP given during last PT episode)--core and LE  strengthening, balance, aerobic ex plan--goal of weight loss    Consulted and Agree with Plan of Care  Patient       Patient will benefit from skilled therapeutic intervention in order to improve the following deficits and impairments:  Abnormal gait, Decreased balance, Decreased cognition, Decreased endurance, Obesity  Visit Diagnosis: Other abnormalities of gait and mobility - Plan: PT plan of care cert/re-cert  Unsteadiness on feet - Plan: PT plan of care cert/re-cert  Other symptoms and signs involving the nervous system - Plan: PT plan of care cert/re-cert     Problem List Patient Active Problem List   Diagnosis Date Noted  . Gait abnormality 10/04/2017  . Radiculopathy 08/30/2014  . Multiple sclerosis (HCC) 08/10/2014  . Diabetes mellitus without complication (HCC)   . CVA (cerebral infarction)    . Thoracic or lumbosacral neuritis or radiculitis, unspecified   . Other and unspecified hyperlipidemia   . Encounter for long-term (current) use of other medications   . Unspecified late effects of cerebrovascular disease   . Unspecified sleep apnea   . 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 Dysarthria 06/01/2013  . Hypertension 06/01/2013    Zena Amos, PT 12/16/2018, 7:31 PM  [facility] [address] [place], [place], [address] Phone: [phone]   Fax:  [phone]  Name: Thomas Nixon MRN: [id number] Date of Birth: 05-11-1971

## 2018-12-20 ENCOUNTER — Ambulatory Visit: Payer: Medicare PPO | Admitting: Physical Therapy

## 2018-12-27 ENCOUNTER — Encounter: Payer: Self-pay | Admitting: Physical Therapy

## 2018-12-27 ENCOUNTER — Ambulatory Visit: Payer: Medicare PPO | Attending: Neurology | Admitting: Physical Therapy

## 2018-12-27 DIAGNOSIS — R29818 Other symptoms and signs involving the nervous system: Secondary | ICD-10-CM | POA: Diagnosis not present

## 2018-12-27 DIAGNOSIS — R2689 Other abnormalities of gait and mobility: Secondary | ICD-10-CM | POA: Diagnosis not present

## 2018-12-27 DIAGNOSIS — M6281 Muscle weakness (generalized): Secondary | ICD-10-CM

## 2018-12-27 NOTE — Therapy (Signed)
[facility] [address] [place], [place], [address] Phone: [phone]   Fax:  [phone]  Physical Therapy Treatment  Patient Details  Name: Thomas Nixon MRN: [id number] Date of Birth: 1970/12/30 Referring Provider (PT):  Levert Feinstein, MD   Encounter Date: 12/27/2018  PT End of Session - 12/27/18 0803    Visit Number  2    Number of Visits  7    Date for PT Re-Evaluation  02/03/19   allowing 1 wk for pre-authorization, plus 6 wks of PT   Authorization Type  [employer] (requires pre-authorization for treatment)    PT Start Time  0801    PT Stop Time  0847    PT Time Calculation (min)  46 min    Activity Tolerance  Patient tolerated treatment well    Behavior During Therapy  [facility] for tasks assessed/performed       Past Medical History:  Diagnosis Date  . CVA (cerebral infarction)   . Diabetes mellitus without complication (HCC)    Type 2  . Encounter for long-term (current) use of other medications   . Headache(784.0)   . Hypertension   . MS (multiple sclerosis) (HCC)    possible  . Neuropathy   . Other and unspecified hyperlipidemia   . Stroke (HCC)    5 strokes  . Thoracic or lumbosacral neuritis or radiculitis, unspecified   . Unspecified late effects of cerebrovascular disease   . Unspecified sleep apnea    does not use cpap    Past Surgical History:  Procedure Laterality Date  . ANTERIOR LAT LUMBAR FUSION N/A 08/30/2014   Procedure: ANTERIOR LATERAL LUMBAR FUSION 1 LEVEL;  Surgeon: Emilee Hero, MD;  Location: [facility] OR;  Service: Orthopedics;  Laterality: N/A;  Lumbar 3-4 lateral interbody fusion with instrumentation and allograft  . BRAIN BIOPSY      There were no vitals filed for this visit.  Subjective Assessment - 12/27/18 0806    Subjective  Doing ok. Has not really done any walking since he was here last. Did do his piriformis stretches some--maybe once per day. Not sure if it's  helping. Did not do any of his previous HEP.     Pertinent History  PMH-hypertension, hyperlipidemia, diabetes, back surgery 2015, CVA x 5, Craniotomy for brain biopsy, severe left visual field cut, left neglect.    Patient Stated Goals  Get back on an exercise routine and continue to lose weight.     Currently in Pain?  No/denies                       [facility] Adult PT Treatment/Exercise - 12/27/18 0001      Ambulation/Gait   Ambulation/Gait  Yes    Ambulation/Gait Assistance  5: Supervision;4: Min assist    Ambulation/Gait Assistance Details  supervision with cues for step length, heel strike (to improve foot clearance), and relax shoulders for incr UE swing; assisted UE swing with walking poles held by pt on one end and PT on the other end walking behind him; improved arm swing noted when walking sticks removed    Ambulation Distance (Feet)  800 Feet   in addition to treadmill (0.34 mi)   Assistive device  None   walking stick vs sticks   Gait Pattern  Step-through pattern;Decreased arm swing - right;Decreased arm swing - left;Decreased stride length;Poor foot clearance - left;Poor foot clearance - right;Decreased trunk rotation    Ambulation Surface  Level;Indoor      Lumbar Exercises: Stretches   Active Hamstring Stretch  Right;Left;1 rep;30 seconds    Active Hamstring Stretch Limitations  supine with strap    Piriformis Stretch  Right;Left;1 rep;30 seconds   each leg   Figure 4 Stretch  2 reps;30 seconds;With overpressure;Supine   bil LEs   Gastroc Stretch  Right;Left;1 rep;30 seconds   standing at wall, lunge     Knee/Hip Exercises: Aerobic   Tread Mill  up to 2.4 mph x 10 min             PT Education - 12/27/18 1217    Education Details  must commit to walking program and investigate if he can walk in the gym at rec center if raining/too cold to walk outside (provided name, address and # for rec center closest to his home); proper use of walking stick      Person(s) Educated  Patient    Methods  Explanation;Demonstration;Tactile cues;Verbal cues;Handout    Comprehension  Verbalized understanding;Returned demonstration;Verbal cues required;Tactile cues required;Need further instruction       PT Short Term Goals - 12/16/18 1915      PT SHORT TERM GOAL #1   Title  Patient will be independent with initial HEP and investigated how he can continue aerobic activity through the winter (Target all STGs 01/13/2019)    Time  3    Period  Weeks    Status  New      PT SHORT TERM GOAL #2   Title  Patient will improve FGA to >=25/30 demonstrating progress towards lower fall risk.    Baseline  12/16/18  22/30    Time  3    Period  Weeks    Status  New      PT SHORT TERM GOAL #3   Title  Patient will decrease 5x sit to stand to <=10.0 sec to demonstrate improved bil LE strengthening.     Baseline  12/16/18 12.38 sec    Time  3    Period  Weeks    Status  New        PT Long Term Goals - 12/16/18 1917      PT LONG TERM GOAL #1   Title  Patient will be independent with updated HEP and able to verbalize his plan for continued community-based activity to maintain his gains. (Target all LTGs 02/03/2019)    Time  6    Period  Weeks    Status  New    Target Date  02/03/19      PT LONG TERM GOAL #2   Title  Patient will perform 5x sit to stand <=8.5 seconds demonstrating improved bil LE strength.    Time  6    Period  Weeks    Status  New      PT LONG TERM GOAL #3   Title  Patient will tolerate a minimum of 15 minutes of aerobic activity maintaining his HR between 60-80% of his max HR (175 bpm; 60%=105 bpm, 80%=140 bpm)    Time  6    Period  Weeks    Status  New      PT LONG TERM GOAL #4   Title  Patient will demonstrate ability to check his own pulse/HR (or obtain a device that will monitor it for him)    Time  6    Period  Weeks    Status  New  Plan - 12/27/18 1219    Clinical Impression Statement  Session focused on  gait training (including step length, foot clearance, UE swing) over ground and on treadmill. Educated in starting a walking program as an overall great exercise for weight loss and cardiovascular health. Educated on options for walking indoors when weather does not permit outdoors walking (which pt prefers). Instructed in use of walking stick as pt reported he sometimes feels unsteady when walking over unlevel areas. Patient did well with one stick after education, but could not coordinate 2 walking sticks. He will consider if he wants to purchase. After warm-up, reviewed priformis stretches given previous visit with pt demonstrating good technique.    Rehab Potential  Good    Clinical Impairments Affecting Rehab Potential  decr memory/cognition--will assist pt to develop strategies to recall program and technique    PT Frequency  1x / week    PT Duration  6 weeks    PT Treatment/Interventions  ADLs/Self Care Home Management;Gait training;Stair training;Functional mobility training;Therapeutic activities;Therapeutic exercise;Balance training;Patient/family education;Cognitive remediation;Neuromuscular re-education;Energy conservation;Passive range of motion    PT Next Visit Plan  warm-up on treadmill; add to HEP (may pull from HEP given during last PT episode)--core and LE strengthening, balance, aerobic ex plan (did he check into walking at the rec center in bad weather?)--goal of weight loss    PT Home Exercise Plan  to start walking program; piriformis stretches    Consulted and Agree with Plan of Care  Patient       Patient will benefit from skilled therapeutic intervention in order to improve the following deficits and impairments:  Abnormal gait, Decreased balance, Decreased cognition, Decreased endurance, Obesity  Visit Diagnosis: Other abnormalities of gait and mobility  Muscle weakness (generalized)  Other symptoms and signs involving the nervous system     Problem List Patient  Active Problem List   Diagnosis Date Noted  . Gait abnormality 10/04/2017  . Radiculopathy 08/30/2014  . Multiple sclerosis (HCC) 08/10/2014  . Diabetes mellitus without complication (HCC)   . CVA (cerebral infarction)   . Thoracic or lumbosacral neuritis or radiculitis, unspecified   . Other and unspecified hyperlipidemia   . Encounter for long-term (current) use of other medications   . Unspecified late effects of cerebrovascular disease   . Unspecified sleep apnea   . 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 Dysarthria 06/01/2013  . Hypertension 06/01/2013    Zena AmosLynn P Dezerae Freiberger, PT 12/27/2018, 12:25 PM  [facility] [address] [place], [place], [address] Phone: [phone]   Fax:  [phone]  Name: Thomas Nixon MRN: [id number] Date of Birth: 1971/10/20

## 2018-12-27 NOTE — Patient Instructions (Signed)
  Look into walking poles at [employer]  Look in to [facility] for rainy days  [facility]  Walking is a great form of exercise to increase your strength, endurance and overall fitness.  A walking program can help you start slowly and gradually build endurance as you go.  Everyone's ability is different, so each person's starting point will be different.  You do not have to follow them exactly.  The are just samples. You should simply find out what's right for you and stick to that program.   In the beginning, you'll start off walking 2-3 times a day for short distances.  As you get stronger, you'll be walking further at just 1-2 times per day.  A. You Can Walk For A Certain Length Of Time Each Day    Walk 10 minutes 2 times per day.  Then add 2-4 minutes every few days with goal eventually  Work up to 25-30 minutes (1 times per day).    B. You Can Walk For a Certain Distance Each Day     Distance can be substituted for time.    Example:   3 trips to mailbox (at road)   3 trips to corner of block   3 trips around the block  C. Go to local [employer] and use the track.    Walk for distance ____ around track  Or time ____ minutes  Please only do the exercises that your therapist has initialed and dated

## 2019-01-03 ENCOUNTER — Ambulatory Visit: Payer: Medicare PPO | Admitting: Physical Therapy

## 2019-01-03 ENCOUNTER — Encounter: Payer: Self-pay | Admitting: Physical Therapy

## 2019-01-03 DIAGNOSIS — R29818 Other symptoms and signs involving the nervous system: Secondary | ICD-10-CM

## 2019-01-03 DIAGNOSIS — R2689 Other abnormalities of gait and mobility: Secondary | ICD-10-CM

## 2019-01-03 DIAGNOSIS — M6281 Muscle weakness (generalized): Secondary | ICD-10-CM | POA: Diagnosis not present

## 2019-01-03 NOTE — Patient Instructions (Signed)
Access Code: [email or web address]  URL: [email or web address]  Date: 01/03/2019  Prepared by: Veda Canning   Exercises  Supine Figure 4 Piriformis Stretch - 3 reps - 1 sets - 30 seconds hold - 1-2x daily - 7x weekly  Supine Piriformis Stretch - 3 reps - 1 sets - 30 sec hold - 1-2x daily - 7x weekly  Marching Bridge - 10 reps - 1 sets - - hold - 1x daily - 5x weekly  Bird Dog - 10 reps - 1 sets - 3 seconds hold - 1x daily - 5x weekly  Push Up on Table - 10 reps - 2 sets - - hold - 1x daily - 5x weekly

## 2019-01-03 NOTE — Therapy (Signed)
[facility] [address] [place], [place], [address] Phone: [phone]   Fax:  [phone]  Physical Therapy Treatment  Patient Details  Name: Thomas Nixon MRN: [id number] Date of Birth: 11-Jul-1971 Referring Provider (PT):  Levert Feinstein, MD   Encounter Date: 01/03/2019  PT End of Session - 01/03/19 0814    Visit Number  3    Number of Visits  7    Date for PT Re-Evaluation  02/03/19   allowing 1 wk for pre-authorization, plus 6 wks of PT   Authorization Type  [employer] (requires pre-authorization for treatment)    PT Start Time  0806    PT Stop Time  0844    PT Time Calculation (min)  38 min    Activity Tolerance  Patient tolerated treatment well    Behavior During Therapy  [facility] for tasks assessed/performed       Past Medical History:  Diagnosis Date  . CVA (cerebral infarction)   . Diabetes mellitus without complication (HCC)    Type 2  . Encounter for long-term (current) use of other medications   . Headache(784.0)   . Hypertension   . MS (multiple sclerosis) (HCC)    possible  . Neuropathy   . Other and unspecified hyperlipidemia   . Stroke (HCC)    5 strokes  . Thoracic or lumbosacral neuritis or radiculitis, unspecified   . Unspecified late effects of cerebrovascular disease   . Unspecified sleep apnea    does not use cpap    Past Surgical History:  Procedure Laterality Date  . ANTERIOR LAT LUMBAR FUSION N/A 08/30/2014   Procedure: ANTERIOR LATERAL LUMBAR FUSION 1 LEVEL;  Surgeon: Emilee Hero, MD;  Location: [facility] OR;  Service: Orthopedics;  Laterality: N/A;  Lumbar 3-4 lateral interbody fusion with instrumentation and allograft  . BRAIN BIOPSY      There were no vitals filed for this visit.  Subjective Assessment - 01/03/19 0807    Subjective  Doing OK. did get a walking stick. Considering joining a gym. The rec center near his home is too far away to use for bad weather days (for  walking)    Pertinent History  PMH-hypertension, hyperlipidemia, diabetes, back surgery 2015, CVA x 5, Craniotomy for brain biopsy, severe left visual field cut, left neglect.    Patient Stated Goals  Get back on an exercise routine and continue to lose weight.     Currently in Pain?  No/denies                       [facility] Adult PT Treatment/Exercise - 01/03/19 0001      Lumbar Exercises: Supine   Bridge with March  5 reps   10 marches each step     Lumbar Exercises: Quadruped   Opposite Arm/Leg Raise  Right arm/Left leg;Left arm/Right leg;10 reps      Knee/Hip Exercises: Aerobic   Elliptical  2 min forward at slower pace (warm-up)      Knee/Hip Exercises: Machines for Strengthening   Cybex Leg Press  100 x 20; 170 x 10; 200 x 10      Prone hip extension with 2 sec hold x 5 reps each leg       PT Education - 01/03/19 1052    Education Details  updated HEP; benefits of joining a gym (should he decide to)    Person(s) Educated  Patient    Methods  Explanation;Demonstration;Handout;Verbal cues  Comprehension  Verbalized understanding;Returned demonstration;Verbal cues required       PT Short Term Goals - 12/16/18 1915      PT SHORT TERM GOAL #1   Title  Patient will be independent with initial HEP and investigated how he can continue aerobic activity through the winter (Target all STGs 01/13/2019)    Time  3    Period  Weeks    Status  New      PT SHORT TERM GOAL #2   Title  Patient will improve FGA to >=25/30 demonstrating progress towards lower fall risk.    Baseline  12/16/18  22/30    Time  3    Period  Weeks    Status  New      PT SHORT TERM GOAL #3   Title  Patient will decrease 5x sit to stand to <=10.0 sec to demonstrate improved bil LE strengthening.     Baseline  12/16/18 12.38 sec    Time  3    Period  Weeks    Status  New        PT Long Term Goals - 12/16/18 1917      PT LONG TERM GOAL #1   Title  Patient will be independent with  updated HEP and able to verbalize his plan for continued community-based activity to maintain his gains. (Target all LTGs 02/03/2019)    Time  6    Period  Weeks    Status  New    Target Date  02/03/19      PT LONG TERM GOAL #2   Title  Patient will perform 5x sit to stand <=8.5 seconds demonstrating improved bil LE strength.    Time  6    Period  Weeks    Status  New      PT LONG TERM GOAL #3   Title  Patient will tolerate a minimum of 15 minutes of aerobic activity maintaining his HR between 60-80% of his max HR (175 bpm; 60%=105 bpm, 80%=140 bpm)    Time  6    Period  Weeks    Status  New      PT LONG TERM GOAL #4   Title  Patient will demonstrate ability to check his own pulse/HR (or obtain a device that will monitor it for him)    Time  6    Period  Weeks    Status  New            Plan - 01/03/19 1053    Clinical Impression Statement  Patient with late arrival due to inclement weather. Session focused on establishing his HEP for aerobic and strengthening exercises. Patient seems more motivated this round of therapy. We discussed not only the physical benefits of exercise, but also the mental benefits as he reports he struggles with anxiety. Patient can continue to benefit from PT.     Rehab Potential  Good    Clinical Impairments Affecting Rehab Potential  decr memory/cognition--will assist pt to develop strategies to recall program and technique    PT Frequency  1x / week    PT Duration  6 weeks    PT Treatment/Interventions  ADLs/Self Care Home Management;Gait training;Stair training;Functional mobility training;Therapeutic activities;Therapeutic exercise;Balance training;Patient/family education;Cognitive remediation;Neuromuscular re-education;Energy conservation;Passive range of motion    PT Next Visit Plan  warm-up on treadmill; check HEP technique; check walking technique with walking stick--goal of weight loss    PT Home Exercise Plan  to start walking program;  piriformis stretches    Consulted and Agree with Plan of Care  Patient       Patient will benefit from skilled therapeutic intervention in order to improve the following deficits and impairments:  Abnormal gait, Decreased balance, Decreased cognition, Decreased endurance, Obesity  Visit Diagnosis: Other abnormalities of gait and mobility  Muscle weakness (generalized)  Other symptoms and signs involving the nervous system     Problem List Patient Active Problem List   Diagnosis Date Noted  . Gait abnormality 10/04/2017  . Radiculopathy 08/30/2014  . Multiple sclerosis (HCC) 08/10/2014  . Diabetes mellitus without complication (HCC)   . CVA (cerebral infarction)   . Thoracic or lumbosacral neuritis or radiculitis, unspecified   . Other and unspecified hyperlipidemia   . Encounter for long-term (current) use of other medications   . Unspecified late effects of cerebrovascular disease   . Unspecified sleep apnea   . 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 Dysarthria 06/01/2013  . Hypertension 06/01/2013    Zena Amos, PT 01/03/2019, 10:59 AM  [facility] [address] [place], [place], [address] Phone: [phone]   Fax:  [phone]  Name: PAXSON STEFANOVIC MRN: [id number] Date of Birth: 01/21/1971

## 2019-01-10 ENCOUNTER — Ambulatory Visit: Payer: Medicare PPO | Admitting: Physical Therapy

## 2019-01-17 ENCOUNTER — Encounter: Payer: Self-pay | Admitting: Physical Therapy

## 2019-01-17 ENCOUNTER — Ambulatory Visit: Payer: Medicare PPO | Admitting: Physical Therapy

## 2019-01-17 DIAGNOSIS — M6281 Muscle weakness (generalized): Secondary | ICD-10-CM

## 2019-01-17 DIAGNOSIS — R29818 Other symptoms and signs involving the nervous system: Secondary | ICD-10-CM

## 2019-01-17 DIAGNOSIS — R2689 Other abnormalities of gait and mobility: Secondary | ICD-10-CM | POA: Diagnosis not present

## 2019-01-17 NOTE — Patient Instructions (Signed)
Access Code: [email or web address]  URL: [email or web address]  Date: 01/17/2019  Prepared by: Veda Canning   Exercises  Supine Figure 4 Piriformis Stretch - 3 reps - 1 sets - 30 seconds hold - 1-2x daily - 7x weekly  Supine Piriformis Stretch - 3 reps - 1 sets - 30 sec hold - 1-2x daily - 7x weekly  Marching Bridge - 10 reps - 1 sets - - hold - 1x daily - 5x weekly  Bird Dog - 10 reps - 1 sets - 3 seconds hold - 1x daily - 5x weekly  Push Up on Table - 10 reps - 2 sets - - hold - 1x daily - 5x weekly   ADDED: wall slide - 10 reps - 1-2 sets - 5 seconds hold - 1x daily - 5x weekly  Single Leg Heel Raise with Chair Support - 20 reps - 1-2 sets - 3 seconds hold - 1x daily - 5x weekly  Standing Hip Abduction with Resistance at Ankles and Counter Support - 10 reps - 1-2 sets - 3 hold - 1x daily - 5x weekly

## 2019-01-17 NOTE — Therapy (Signed)
[facility] [address] [place], [place], [address] Phone: [phone]   Fax:  [phone]  Physical Therapy Treatment  Patient Details  Name: Thomas Nixon MRN: [id number] Date of Birth: 01-11-1971 Referring Provider (PT):  Levert Feinstein, MD   Encounter Date: 01/17/2019  PT End of Session - 01/17/19 0805    Visit Number  4    Number of Visits  7    Date for PT Re-Evaluation  02/03/19   allowing 1 wk for pre-authorization, plus 6 wks of PT   Authorization Type  [employer] (requires pre-authorization for treatment)    PT Start Time  0803    PT Stop Time  0845    PT Time Calculation (min)  42 min    Activity Tolerance  Patient tolerated treatment well    Behavior During Therapy  [facility] for tasks assessed/performed       Past Medical History:  Diagnosis Date  . CVA (cerebral infarction)   . Diabetes mellitus without complication (HCC)    Type 2  . Encounter for long-term (current) use of other medications   . Headache(784.0)   . Hypertension   . MS (multiple sclerosis) (HCC)    possible  . Neuropathy   . Other and unspecified hyperlipidemia   . Stroke (HCC)    5 strokes  . Thoracic or lumbosacral neuritis or radiculitis, unspecified   . Unspecified late effects of cerebrovascular disease   . Unspecified sleep apnea    does not use cpap    Past Surgical History:  Procedure Laterality Date  . ANTERIOR LAT LUMBAR FUSION N/A 08/30/2014   Procedure: ANTERIOR LATERAL LUMBAR FUSION 1 LEVEL;  Surgeon: Emilee Hero, MD;  Location: [facility];  Service: Orthopedics;  Laterality: N/A;  Lumbar 3-4 lateral interbody fusion with instrumentation and allograft  . BRAIN BIOPSY      There were no vitals filed for this visit.  Subjective Assessment - 01/17/19 0805    Subjective  Didn't come last week because he was tired. has been using walking stick and walking almost daily for 40-60 minutes.     Pertinent History   PMH-hypertension, hyperlipidemia, diabetes, back surgery 2015, CVA x 5, Craniotomy for brain biopsy, severe left visual field cut, left neglect.    Patient Stated Goals  Get back on an exercise routine and continue to lose weight.     Currently in Pain?  No/denies                       [facility] Adult PT Treatment/Exercise - 01/17/19 0813      Knee/Hip Exercises: Aerobic   Tread Mill  up to 2.1 mph x 4 minutes for warm-up      Knee/Hip Exercises: Standing   Heel Raises  Both;2 sets;10 reps    Heel Raises Limitations  single leg at a time; holding counter; vc for full knee extension    Hip Abduction  Stengthening;Both;1 set;20 reps;Knee straight    Abduction Limitations  vc for proper alignment    Wall Squat  1 set;5 seconds;10 reps    Wall Squat Limitations  last rep, pt descended too far and required assist to push back up to standing    Rocker Board Limitations  anterior-posterior EO; step off/on backwards alternating legs; then off/on stepping forwards alternating legs--both initiated with bil UE support on // bars with progressing to one hand, one finger and then no hands  PT Education - 01/17/19 1134    Education Details  additions to HEP    Person(s) Educated  Patient    Methods  Explanation;Demonstration;Verbal cues;Handout;Tactile cues    Comprehension  Verbalized understanding;Returned demonstration;Verbal cues required;Tactile cues required;Need further instruction       PT Short Term Goals - 01/17/19 1136      PT SHORT TERM GOAL #1   Title  Patient will be independent with initial HEP and investigated how he can continue aerobic activity through the winter (Target all STGs 01/13/2019)    Time  3    Period  Weeks    Status  New      PT SHORT TERM GOAL #2   Title  Patient will improve FGA to >=25/30 demonstrating progress towards lower fall risk.    Baseline  12/16/18  22/30    Time  3    Period  Weeks    Status  New      PT SHORT TERM  GOAL #3   Title  Patient will decrease 5x sit to stand to <=10.0 sec to demonstrate improved bil LE strengthening.     Baseline  12/16/18 12.38 sec    Time  3    Period  Weeks    Status  New        PT Long Term Goals - 12/16/18 1917      PT LONG TERM GOAL #1   Title  Patient will be independent with updated HEP and able to verbalize his plan for continued community-based activity to maintain his gains. (Target all LTGs 02/03/2019)    Time  6    Period  Weeks    Status  New    Target Date  02/03/19      PT LONG TERM GOAL #2   Title  Patient will perform 5x sit to stand <=8.5 seconds demonstrating improved bil LE strength.    Time  6    Period  Weeks    Status  New      PT LONG TERM GOAL #3   Title  Patient will tolerate a minimum of 15 minutes of aerobic activity maintaining his HR between 60-80% of his max HR (175 bpm; 60%=105 bpm, 80%=140 bpm)    Time  6    Period  Weeks    Status  New      PT LONG TERM GOAL #4   Title  Patient will demonstrate ability to check his own pulse/HR (or obtain a device that will monitor it for him)    Time  6    Period  Weeks    Status  New            Plan - 01/17/19 1134    Clinical Impression Statement  Session focused on addition of leg strengthening exercises to HEP to complete his HEP. He now has a good mix of exercises for strengthening his core and legs, along with stretches to assist with low back pain. Noted next visit is his last scheduled visit and will assess progress towards goals at that time and plan for discharge.     Rehab Potential  Good    Clinical Impairments Affecting Rehab Potential  decr memory/cognition--will assist pt to develop strategies to recall program and technique    PT Frequency  1x / week    PT Duration  6 weeks    PT Treatment/Interventions  ADLs/Self Care Home Management;Gait training;Stair training;Functional mobility training;Therapeutic activities;Therapeutic exercise;Balance training;Patient/family  education;Cognitive remediation;Neuromuscular re-education;Energy  conservation;Passive range of motion    PT Next Visit Plan  chk STGs/LTGs and discharge    PT Home Exercise Plan  walking program; [facility]    Consulted and Agree with Plan of Care  Patient       Patient will benefit from skilled therapeutic intervention in order to improve the following deficits and impairments:  Abnormal gait, Decreased balance, Decreased cognition, Decreased endurance, Obesity  Visit Diagnosis: Muscle weakness (generalized)  Other symptoms and signs involving the nervous system     Problem List Patient Active Problem List   Diagnosis Date Noted  . Gait abnormality 10/04/2017  . Radiculopathy 08/30/2014  . Multiple sclerosis (HCC) 08/10/2014  . Diabetes mellitus without complication (HCC)   . CVA (cerebral infarction)   . Thoracic or lumbosacral neuritis or radiculitis, unspecified   . Other and unspecified hyperlipidemia   . Encounter for long-term (current) use of other medications   . Unspecified late effects of cerebrovascular disease   . Unspecified sleep apnea   . 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 Dysarthria 06/01/2013  . Hypertension 06/01/2013    Zena Amos, PT 01/17/2019, 11:40 AM  [facility] [address] [place], [place], [address] Phone: [phone]   Fax:  [phone]  Name: Thomas Nixon MRN: [id number] Date of Birth: July 04, 1971

## 2019-01-24 ENCOUNTER — Encounter: Payer: Self-pay | Admitting: Physical Therapy

## 2019-01-24 ENCOUNTER — Ambulatory Visit: Payer: Medicare PPO | Attending: Neurology | Admitting: Physical Therapy

## 2019-01-24 DIAGNOSIS — M6281 Muscle weakness (generalized): Secondary | ICD-10-CM | POA: Diagnosis not present

## 2019-01-24 DIAGNOSIS — R29818 Other symptoms and signs involving the nervous system: Secondary | ICD-10-CM | POA: Diagnosis not present

## 2019-01-24 NOTE — Therapy (Signed)
[facility] [address] [place], [place], [address] Phone: [phone]   Fax:  [phone]  Physical Therapy Treatment and Discharge Summary  Patient Details  Name: Thomas Nixon MRN: [id number] Date of Birth: 06/05/1971 Referring Provider (PT):  Marcial Pacas, MD   Encounter Date: 01/24/2019  PT End of Session - 01/24/19 0805    Visit Number  5    Number of Visits  7    Date for PT Re-Evaluation  02/03/19   allowing 1 wk for pre-authorization, plus 6 wks of PT   Authorization Type  [employer] (requires pre-authorization for treatment)    PT Start Time  0805    PT Stop Time  0845    PT Time Calculation (min)  40 min    Activity Tolerance  Patient tolerated treatment well    Behavior During Therapy  [facility] for tasks assessed/performed       Past Medical History:  Diagnosis Date  . CVA (cerebral infarction)   . Diabetes mellitus without complication (HCC)    Type 2  . Encounter for long-term (current) use of other medications   . Headache(784.0)   . Hypertension   . MS (multiple sclerosis) ([place])    possible  . Neuropathy   . Other and unspecified hyperlipidemia   . Stroke ([place])    5 strokes  . Thoracic or lumbosacral neuritis or radiculitis, unspecified   . Unspecified late effects of cerebrovascular disease   . Unspecified sleep apnea    does not use cpap    Past Surgical History:  Procedure Laterality Date  . ANTERIOR LAT LUMBAR FUSION N/A 08/30/2014   Procedure: ANTERIOR LATERAL LUMBAR FUSION 1 LEVEL;  Surgeon: Sinclair Ship, MD;  Location: [place];  Service: Orthopedics;  Laterality: N/A;  Lumbar 3-4 lateral interbody fusion with instrumentation and allograft  . BRAIN BIOPSY      There were no vitals filed for this visit.  Subjective Assessment - 01/24/19 1636    Subjective  Continues to walk 5 days/week and is feeling better about his walking.    Pertinent History  PMH-hypertension,  hyperlipidemia, diabetes, back surgery 2015, CVA x 5, Craniotomy for brain biopsy, severe left visual field cut, left neglect.    Patient Stated Goals  Get back on an exercise routine and continue to lose weight.     Currently in Pain?  No/denies         [facility] PT Assessment - 01/24/19 0816      Transfers   Five time sit to stand comments   8.69      Functional Gait  Assessment   Gait Level Surface  Walks 20 ft in less than 7 sec but greater than 5.5 sec, uses assistive device, slower speed, mild gait deviations, or deviates 6-10 in outside of the 12 in walkway width.   6.95   Change in Gait Speed  Able to smoothly change walking speed without loss of balance or gait deviation. Deviate no more than 6 in outside of the 12 in walkway width.    Gait with Horizontal Head Turns  Performs head turns smoothly with no change in gait. Deviates no more than 6 in outside 12 in walkway width    Gait with Vertical Head Turns  Performs head turns with no change in gait. Deviates no more than 6 in outside 12 in walkway width.    Gait and Pivot Turn  Pivot turns safely within 3 sec and stops  quickly with no loss of balance.    Step Over Obstacle  Is able to step over 2 stacked shoe boxes taped together (9 in total height) without changing gait speed. No evidence of imbalance.    Gait with Narrow Base of Support  Ambulates less than 4 steps heel to toe or cannot perform without assistance.    Gait with Eyes Closed  Walks 20 ft, uses assistive device, slower speed, mild gait deviations, deviates 6-10 in outside 12 in walkway width. Ambulates 20 ft in less than 9 sec but greater than 7 sec.    Ambulating Backwards  Walks 20 ft, no assistive devices, good speed, no evidence for imbalance, normal gait    Steps  Alternating feet, no rail.    Total Score  25                   [facility] Adult PT Treatment/Exercise - 01/24/19 0816      Knee/Hip Exercises: Aerobic   Nustep  L4 x 4 min warmup; L6 x 10  minutes with HR 108-115 bpm (in target HR)      Knee/Hip Exercises: Machines for Strengthening   Cybex Leg Press  250 x5 reps               PT Short Term Goals - 01/24/19 0829      PT SHORT TERM GOAL #1   Title  Patient will be independent with initial HEP and investigated how he can continue aerobic activity through the winter (Target all STGs 01/13/2019)    Baseline  01/24/19 Walking for 45-60 minutes 5x/wk    Time  3    Period  Weeks    Status  Achieved      PT SHORT TERM GOAL #2   Title  Patient will improve FGA to >=25/30 demonstrating progress towards lower fall risk.    Baseline  12/16/18  22/30; 01/24/19  25/30    Time  3    Period  Weeks    Status  Achieved      PT SHORT TERM GOAL #3   Title  Patient will decrease 5x sit to stand to <=10.0 sec to demonstrate improved bil LE strengthening.     Baseline  12/16/18 12.38 sec; 01/24/19  8.69 sec    Time  3    Period  Weeks    Status  Achieved        PT Long Term Goals - 01/24/19 0830      PT LONG TERM GOAL #1   Title  Patient will be independent with updated HEP and able to verbalize his plan for continued community-based activity to maintain his gains. (Target all LTGs 02/03/2019)    Baseline  01/24/19 walking program    Time  6    Period  Weeks    Status  Achieved      PT LONG TERM GOAL #2   Title  Patient will perform 5x sit to stand <=8.5 seconds demonstrating improved bil LE strength.    Baseline  01/24/19 8.69 sec    Time  6    Period  Weeks    Status  Partially Met      PT LONG TERM GOAL #3   Title  Patient will tolerate a minimum of 15 minutes of aerobic activity maintaining his HR between 60-80% of his max HR (175 bpm; 60%=105 bpm, 80%=140 bpm)    Baseline  01/24/19 met    Time  6  Period  Weeks    Status  Achieved      PT LONG TERM GOAL #4   Title  Patient will demonstrate ability to check his own pulse/HR (or obtain a device that will monitor it for him)    Baseline  01/24/19 deferred as pt has a  device that measures his HR    Time  6    Period  Weeks    Status  Deferred            Plan - 01/24/19 9179    Clinical Impression Statement  Goals assessed with pt meeting 3 of 3 STGs and 2 of 4 LTGs (one LTG deferred and one partially met--progress made but not to goal level). Patient has chosen walking program for his long-term exercise program and feels more confident now that he is using a trekking pole. Overall, pt has made good progress and agrees with discharge at this time.    Rehab Potential  Good    Clinical Impairments Affecting Rehab Potential  decr memory/cognition--will assist pt to develop strategies to recall program and technique    PT Frequency  1x / week    PT Duration  6 weeks    PT Treatment/Interventions  ADLs/Self Care Home Management;Gait training;Stair training;Functional mobility training;Therapeutic activities;Therapeutic exercise;Balance training;Patient/family education;Cognitive remediation;Neuromuscular re-education;Energy conservation;Passive range of motion    PT Next Visit Plan  --    PT Home Exercise Plan  walking program; [facility]    Consulted and Agree with Plan of Care  Patient       Patient will benefit from skilled therapeutic intervention in order to improve the following deficits and impairments:  Abnormal gait, Decreased balance, Decreased cognition, Decreased endurance, Obesity  Visit Diagnosis: Muscle weakness (generalized)  Other symptoms and signs involving the nervous system     Problem List Patient Active Problem List   Diagnosis Date Noted  . Gait abnormality 10/04/2017  . Radiculopathy 08/30/2014  . Multiple sclerosis ([place]) 08/10/2014  . Diabetes mellitus without complication ([place])   . CVA (cerebral infarction)   . Thoracic or lumbosacral neuritis or radiculitis, unspecified   . Other and unspecified hyperlipidemia   . Encounter for long-term (current) use of other medications   . Unspecified late effects of  cerebrovascular disease   . Unspecified sleep apnea   . Thrombotic cerebral infarction ([place]) 06/07/2013  . Dysarthria 06/01/2013  . Hypertension 06/01/2013   PHYSICAL THERAPY DISCHARGE SUMMARY  Visits from Start of Care: 5  Current functional level related to goals / functional outcomes: See goals above   Remaining deficits: Mild imbalance    Education / Equipment: HEP; educated on use of walking pole (pt purchased on his own)  Plan: Patient agrees to discharge.  Patient goals were partially met. Patient is being discharged due to being pleased with the current functional level.  ?????       Rexanne Mano, PT 01/24/2019, 4:48 PM  [facility] [address] [place], [place], [address] Phone: [phone]   Fax:  [phone]  Name: ZAAHIR PICKNEY MRN: [id number] Date of Birth: 05-Jun-1971

## 2019-02-01 ENCOUNTER — Telehealth: Payer: Self-pay | Admitting: Neurology

## 2019-02-01 MED ORDER — DIMETHYL FUMARATE 240 MG PO CPDR: 240.0000 mg | DELAYED_RELEASE_CAPSULE | Freq: Two times a day (BID) | ORAL | 3 refills | Status: DC

## 2019-02-01 NOTE — Telephone Encounter (Signed)
Pt is calling for a refill on his Dimethyl Fumarate (TECFIDERA) 240 MG CPDR he states it is to be filled at another pharmacy that is not his usual [facility]. Its to be sent to [facility] and the Fax# [phone]. Please advise.

## 2019-02-01 NOTE — Telephone Encounter (Signed)
I returned called to the patient to get the pharmacy's phone number.  He states it is [facility] (contact [phone]).  I called [facility] and spoke to customer service rep, Tivo.  I wanted to make sure the patient's Tecfidera is being sent to the correct place. They are the pharmacy provider for [employer]'s patient assistance program.  For Tecfidera, the prescription actually needs to be escribed to [facility].  I verified that I added the correct pharmacy while on the phone with Tivo.    Tecfidera escribed to the appropriate pharmacy.

## 2019-03-28 DIAGNOSIS — Z1389 Encounter for screening for other disorder: Secondary | ICD-10-CM | POA: Diagnosis not present

## 2019-03-28 DIAGNOSIS — G629 Polyneuropathy, unspecified: Secondary | ICD-10-CM | POA: Diagnosis not present

## 2019-03-28 DIAGNOSIS — I10 Essential (primary) hypertension: Secondary | ICD-10-CM | POA: Diagnosis not present

## 2019-03-28 DIAGNOSIS — G894 Chronic pain syndrome: Secondary | ICD-10-CM | POA: Diagnosis not present

## 2019-03-28 DIAGNOSIS — E1140 Type 2 diabetes mellitus with diabetic neuropathy, unspecified: Secondary | ICD-10-CM | POA: Diagnosis not present

## 2019-03-28 DIAGNOSIS — E7800 Pure hypercholesterolemia, unspecified: Secondary | ICD-10-CM | POA: Diagnosis not present

## 2019-03-28 DIAGNOSIS — I639 Cerebral infarction, unspecified: Secondary | ICD-10-CM | POA: Diagnosis not present

## 2019-03-28 DIAGNOSIS — G35 Multiple sclerosis: Secondary | ICD-10-CM | POA: Diagnosis not present

## 2019-03-28 DIAGNOSIS — Z0000 Encounter for general adult medical examination without abnormal findings: Secondary | ICD-10-CM | POA: Diagnosis not present

## 2019-03-29 DIAGNOSIS — I10 Essential (primary) hypertension: Secondary | ICD-10-CM | POA: Diagnosis not present

## 2019-03-29 DIAGNOSIS — Z7984 Long term (current) use of oral hypoglycemic drugs: Secondary | ICD-10-CM | POA: Diagnosis not present

## 2019-03-29 DIAGNOSIS — E7800 Pure hypercholesterolemia, unspecified: Secondary | ICD-10-CM | POA: Diagnosis not present

## 2019-03-29 DIAGNOSIS — Z125 Encounter for screening for malignant neoplasm of prostate: Secondary | ICD-10-CM | POA: Diagnosis not present

## 2019-03-29 DIAGNOSIS — E1140 Type 2 diabetes mellitus with diabetic neuropathy, unspecified: Secondary | ICD-10-CM | POA: Diagnosis not present

## 2019-03-30 DIAGNOSIS — E119 Type 2 diabetes mellitus without complications: Secondary | ICD-10-CM | POA: Diagnosis not present

## 2019-03-30 DIAGNOSIS — E1140 Type 2 diabetes mellitus with diabetic neuropathy, unspecified: Secondary | ICD-10-CM | POA: Diagnosis not present

## 2019-03-30 DIAGNOSIS — Z7984 Long term (current) use of oral hypoglycemic drugs: Secondary | ICD-10-CM | POA: Diagnosis not present

## 2019-05-29 DIAGNOSIS — I10 Essential (primary) hypertension: Secondary | ICD-10-CM | POA: Diagnosis not present

## 2019-05-29 DIAGNOSIS — G35 Multiple sclerosis: Secondary | ICD-10-CM | POA: Diagnosis not present

## 2019-05-29 DIAGNOSIS — Z7984 Long term (current) use of oral hypoglycemic drugs: Secondary | ICD-10-CM | POA: Diagnosis not present

## 2019-05-29 DIAGNOSIS — E1140 Type 2 diabetes mellitus with diabetic neuropathy, unspecified: Secondary | ICD-10-CM | POA: Diagnosis not present

## 2019-05-30 ENCOUNTER — Ambulatory Visit: Payer: Medicare PPO | Admitting: Neurology

## 2019-05-30 ENCOUNTER — Other Ambulatory Visit: Payer: Self-pay

## 2019-05-30 ENCOUNTER — Encounter: Payer: Self-pay | Admitting: Neurology

## 2019-05-30 VITALS — BP 142/99 | HR 90 | Temp 98.8°F | Ht 69.0 in | Wt 251.4 lb

## 2019-05-30 DIAGNOSIS — R269 Unspecified abnormalities of gait and mobility: Secondary | ICD-10-CM

## 2019-05-30 DIAGNOSIS — G35 Multiple sclerosis: Secondary | ICD-10-CM

## 2019-05-30 NOTE — Progress Notes (Signed)
PATIENT: Thomas Nixon DOB: 1971-07-06  REASON FOR VISIT: follow up HISTORY FROM: patient  HISTORY OF PRESENT ILLNESS: Today 05/30/19  HISTORY  Thomas Nixon  is a 48 years old male, seen in refer by  his primary care doctor Thomas Nixon for evaluation of gait abnormality, initial evaluation was on November twelfth 2018.  I reviewed and summarized referring note, he has history of hypertension, hyperlipidemia, diabetes, I saw him in 2011, lost follow-up, saw him again 2015, then lost follow-up again,  He had multiple strokelike episode in the past, started since his age twenties, he was treated at different medical facility, including [facility], [facility], [facility].  I have reviewed records from [facility] in 2002, with a diagnosis of acute demyelinating lesions, affecting both left, and right side of the brain, with severe left visual field cut, left neglect.  VEP was normal.   Lab showed normal or negative RPR, HIV, B12, folate acid, Lyme titer, ANA, TSH, protein electrophoresis, mild elevated CPK  [facility]: In 2014 has demonstrated extensive periventricular white matter disease, evidence of previous right parietal biopsy, hemosiderin deposit.  MRA of the brain was normal. ultrasound of carotid artery showed no significant large vessel disease.  [facility], dated August 2009 by neurologist Dr. Lillia DallasJames Nixon:  Laboratory evaluation,normaal fasting lipid profile,LDL was 53, negative HIV, RPR,VDRL, Athena Notch-3 Gene Mutation negative. norml CBC, INR,, CMP, other than mildly elevated Glu 128, A1C 6.4, TSH 1.5.  TTE: normal.  There were also mention of brain biopsy with demyelinating features, he did have improvement with steroid treatment, biopsy [facility].  Over the past few years, he complains of slow worsening gait difficulty, slurred speech, he denies visual trouble, he denies  bowel and bladder incontinence.  [facility], he has evidence of right lumbar radiculopathy, with MRI notable for bilateral L3 pars defect, 10 mm spondylolisthesis at L3 and 4, increased on flexion radiograph, the supported by positive EMG findings, he had lumbar decompression surgery by Dr. Ottis Nixon in 2015, which has helped his low back pain,   MRI lumbar in January 2017, status post L3-4 fusion, no significant canal foraminal stenosis  MRI of brain in September 2015, confluent periventricular and subcortical T2 hyperintensity, hemosiderin deposit in the right frontal cortical and bilateral parietal periventricular region, consistent with chronic intracerebral hemorrhage. Right frontal craniotomy and biopsy sequelae noted, compared to MRI in 2010, there is mild progression of white matter disease.  MRI cervical spine, mild degenerative changes, there is no evidence of intrinsic spinal cord disease  He lives with his wife, take care of the house chore, he noted mild increased gait abnormality, slurred speech, mild worsening memory loss  UPDATE Dec 09 2017: He is accompanied by his wife at today's clinical visit, he continue complains of mild gait abnormality, memory loss, we were able to review MRI of the brain with and without contrast in November 2018, there was evidence of confluent white matter T2/FLAIR hyperintensity signal in the right greater than left hemisphere, possibility include genetic demyelinating disease, inflammatory demyelinating disease,  But based on previous brain biopsy in 2009, there was demyelinating features, he has significant symptom improvement with steroid treatment, suggestive of inflammatory component, he never received any long-term immunomodulation therapy,  I will refer him to lumbar puncture, he has missed multiple scheduled appointment in the past, we decided to proceed with ocrelizumab if lumbar puncture showed typical  findings of elevated oligoclonal banding consistent with MS,  Update February 07, 2018: He is accompanied by his wife at today's clinical visit, who has known him since 2009, Thomas Nixon did have gradual decline over the past 10 years, worsening memory loss, gait abnormality, most recent flareups was In 2014, he presented with left side stroke like event  JC virus titer 2.83 in Jan 2019, his insurance has limited coverage for ocrelizumab, he wants to explore other treatment options, Tysabri is not a good option due to high JC virus titer  Spinal fluid testing showed more than 5 oligoclonal banding,  Laboratory evaluation showed normal negative very long chain fatty acid, arylsufatase A activity.  UPDATE May 16 2018: He tolerated Tecfidera very well, started since March 2019, no significant side effect noticed, there is no change in his functional status, physical therapy has been very helpful, still has gait difficulty, today he was noted to have pseudobulbar expression  UPDATE Jan 9th 2020: He seems to walk better, he tolerate Tecfidera bid, physical therapy was helpful,  We personally reviewed MRI of the brain with and without contrast in July 2019, no significant change compared to previous scan in 2018, confluent foci involving periventricular deep white matter is, in addition, there is some hemosiderin deposition scattered within the white matter,  Update May 30 2019 SS: Is still on Tecfidera BID. He has not had any flares. He denies numbness or weakness in arms or legs. No falls. No problems with bowels or bladder. No change is vision. He is on disability. He sleeps okay at night, but he wakes up in the night to use the bathroom. He lives with his wife. He has 3 adopted children. Speech is stable, he still talks fast. He has been on Tecfidera since March 2019, no problems. He feels more confident in himself, before he was having "flares that he thought were strokes". He walks for  exercise.   REVIEW OF SYSTEMS: Out of a complete 14 system review of symptoms, the patient complains only of the following symptoms, and all other reviewed systems are negative.  Gait change  ALLERGIES: Allergies  Allergen Reactions  . Pork-Derived Products Other (See Comments)    DIET RESTRICTIONS    HOME MEDICATIONS: Outpatient Medications Prior to Visit  Medication Sig Dispense Refill  . amLODipine (NORVASC) 10 MG tablet Take 10 mg daily by mouth.  3  . atorvastatin (LIPITOR) 40 MG tablet Take 1 tablet (40 mg total) by mouth at bedtime. 30 tablet 1  . clopidogrel (PLAVIX) 75 MG tablet Take 75 mg daily by mouth.  1  . Dimethyl Fumarate (TECFIDERA) 240 MG CPDR Take 1 capsule (240 mg total) by mouth 2 (two) times daily. 180 capsule 3  . hydrochlorothiazide (HYDRODIURIL) 25 MG tablet Take 25 mg daily by mouth.  11  . irbesartan (AVAPRO) 300 MG tablet Take 300 mg daily by mouth.  11  . LYRICA 300 MG capsule Take 300 mg 2 (two) times daily by mouth.  2  . metFORMIN (GLUCOPHAGE) 500 MG tablet Take 500 mg by mouth 2 (two) times daily with a meal.    . traMADol (ULTRAM) 50 MG tablet Take 50 mg 2 (two) times daily by mouth.  3  . ACCU-CHEK GUIDE test strip      No facility-administered medications prior to visit.     PAST MEDICAL HISTORY: Past Medical History:  Diagnosis Date  . CVA (cerebral infarction)   . Diabetes mellitus without complication (HCC)  Type 2  . Encounter for long-term (current) use of other medications   . Headache(784.0)   . Hypertension   . MS (multiple sclerosis) ([place])    possible  . Neuropathy   . Other and unspecified hyperlipidemia   . Stroke ([place])    5 strokes  . Thoracic or lumbosacral neuritis or radiculitis, unspecified   . Unspecified late effects of cerebrovascular disease   . Unspecified sleep apnea    does not use cpap    PAST SURGICAL HISTORY: Past Surgical History:  Procedure Laterality Date  . ANTERIOR LAT LUMBAR FUSION N/A  08/30/2014   Procedure: ANTERIOR LATERAL LUMBAR FUSION 1 LEVEL;  Surgeon: Sinclair Ship, MD;  Location: [facility];  Service: Orthopedics;  Laterality: N/A;  Lumbar 3-4 lateral interbody fusion with instrumentation and allograft  . BRAIN BIOPSY      FAMILY HISTORY: Family History  Problem Relation Age of Onset  . High blood pressure Mother   . Diabetes Mother   . High blood pressure Father   . Stroke Maternal Grandmother   . Stroke Paternal Grandmother     SOCIAL HISTORY: Social History   Socioeconomic History  . Marital status: Married    Spouse name: Caren Griffins  . Number of children: 3  . Years of education: college  . Highest education level: Not on file  Occupational History    Comment: Disabled  Social Needs  . Financial resource strain: Not on file  . Food insecurity    Worry: Not on file    Inability: Not on file  . Transportation needs    Medical: Not on file    Non-medical: Not on file  Tobacco Use  . Smoking status: Light Tobacco Smoker    Types: Cigars  . Smokeless tobacco: Never Used  Substance and Sexual Activity  . Alcohol use: Yes    Comment: Once per month.  . Drug use: No  . Sexual activity: Not on file  Lifestyle  . Physical activity    Days per week: Not on file    Minutes per session: Not on file  . Stress: Not on file  Relationships  . Social Herbalist on phone: Not on file    Gets together: Not on file    Attends religious service: Not on file    Active member of club or organization: Not on file    Attends meetings of clubs or organizations: Not on file    Relationship status: Not on file  . Intimate partner violence    Fear of current or ex partner: Not on file    Emotionally abused: Not on file    Physically abused: Not on file    Forced sexual activity: Not on file  Other Topics Concern  . Not on file  Social History Narrative   Patient is married and lives at home with his wife Caren Griffins .   Disabled.   Education  [employer].   Right handed.   1 cup caffeine per day.      PHYSICAL EXAM  Vitals:   05/30/19 0945  BP: (!) 142/99  Pulse: 90  Temp: 98.8 F (37.1 C)  Weight: 251 lb 6.4 oz (114 kg)  Height: 5\' 9"  (1.753 m)   Body mass index is 37.13 kg/m.  Generalized: Well developed, in no acute distress   Neurological examination  Mentation: Alert oriented to time, place, history taking. Follows all commands speech and language fluent, speech is rapid Cranial nerve II-XII:  Pupils were equal round reactive to light. Extraocular movements were full, visual field were full on confrontational test. Facial sensation and strength were normal. Uvula tongue midline. Head turning and shoulder shrug  were normal and symmetric. Motor: The motor testing reveals 5 over 5 strength of all 4 extremities. Good symmetric motor tone is noted throughout.  Sensory: Sensory testing is intact to soft touch on all 4 extremities. No evidence of extinction is noted.  Coordination: Cerebellar testing reveals good finger-nose-finger and heel-to-shin bilaterally.  Gait and station: Gait is wide-based, right limp/dragging right leg, tandem gait impaired Reflexes: Deep tendon reflexes are symmetric and normal bilaterally.   DIAGNOSTIC DATA (LABS, IMAGING, TESTING) - I reviewed patient records, labs, notes, testing and imaging myself where available.  Lab Results  Component Value Date   WBC 7.3 05/16/2018   HGB 13.7 05/16/2018   HCT 41.9 05/16/2018   MCV 85 05/16/2018   PLT 270 05/16/2018      Component Value Date/Time   NA 142 05/16/2018 1636   K 3.9 05/16/2018 1636   CL 104 05/16/2018 1636   CO2 25 05/16/2018 1636   GLUCOSE 127 (H) 05/16/2018 1636   GLUCOSE 102 (H) 08/22/2014 0905   BUN 19 05/16/2018 1636   CREATININE 1.03 05/16/2018 1636   CALCIUM 9.2 05/16/2018 1636   PROT 7.0 05/16/2018 1636   ALBUMIN 4.7 05/16/2018 1636   ALBUMIN 4.3 01/06/2018 0856   AST 21 05/16/2018 1636   ALT 30 05/16/2018 1636    ALKPHOS 35 (L) 05/16/2018 1636   BILITOT 0.2 05/16/2018 1636   GFRNONAA 88 05/16/2018 1636   GFRAA 102 05/16/2018 1636   Lab Results  Component Value Date   CHOL 142 06/02/2013   HDL 34 (L) 06/02/2013   LDLCALC 92 06/02/2013   TRIG 81 06/02/2013   CHOLHDL 4.2 06/02/2013   Lab Results  Component Value Date   HGBA1C 6.2 (H) 06/01/2013   Lab Results  Component Value Date   VITAMINB12 619 10/04/2017   Lab Results  Component Value Date   TSH 1.860 05/16/2018    ASSESSMENT AND PLAN 48 y.o. year old male  has a past medical history of CVA (cerebral infarction), Diabetes mellitus without complication (HCC), Encounter for long-term (current) use of other medications, Headache(784.0), Hypertension, MS (multiple sclerosis) (HCC), Neuropathy, Other and unspecified hyperlipidemia, Stroke [facility](HCC), Thoracic or lumbosacral neuritis or radiculitis, unspecified, Unspecified late effects of cerebrovascular disease, and Unspecified sleep apnea. here with:  1.  Gait abnormality -Encouraged exercise  2.  Abnormal MRI of the brain -We will reorder MRI of the brain with and without contrast  MRI of the brain 05/29/2018 1.    Large confluent foci involving the periventricular and deep white matter bilaterally.  Additionally, there is some hemosiderin deposition scattered within the white matter abnormalities.    The changes are most consistent with stroke.  Superimposed chronic demyelination cannot be ruled out. 2.    There are no acute findings and there is a normal enhancement pattern  3.  Possible relapsing remitting multiple sclerosis -CSF has more than 5 oligoclonal banding -JC virus titer was elevated 2.83, patient is not a good candidate for Tysab  -started Tecfidera since March 2019, tolerating it very well -Check CBC with diff, CMP  today to monitor for adverse effect of Tecfidera  He will follow-up in 6 months or sooner if needed.  I will reorder MRI of the brain with and without contrast.   I spent 15 minutes with the patient.  50% of this time was spent discussing his plan of care   Otila KluverSarah Jakson Delpilar, AGNP-C, DNP 05/30/2019, 9:58 AM [facility] [address], [address] [place], [place] [id number] [phone]

## 2019-05-30 NOTE — Patient Instructions (Signed)
It was a pleasure to meet you! I am glad that you are doing so well!

## 2019-05-31 ENCOUNTER — Telehealth: Payer: Self-pay | Admitting: Neurology

## 2019-05-31 LAB — COMPREHENSIVE METABOLIC PANEL
ALT: 26 IU/L (ref 0–44)
AST: 20 IU/L (ref 0–40)
Albumin/Globulin Ratio: 1.9 (ref 1.2–2.2)
Albumin: 4.6 g/dL (ref 4.0–5.0)
Alkaline Phosphatase: 42 IU/L (ref 39–117)
BUN/Creatinine Ratio: 21 — ABNORMAL HIGH (ref 9–20)
BUN: 24 mg/dL (ref 6–24)
Bilirubin Total: 0.3 mg/dL (ref 0.0–1.2)
CO2: 23 mmol/L (ref 20–29)
Calcium: 9.6 mg/dL (ref 8.7–10.2)
Chloride: 103 mmol/L (ref 96–106)
Creatinine, Ser: 1.12 mg/dL (ref 0.76–1.27)
GFR calc Af Amer: 91 mL/min/{1.73_m2} (ref >59)
GFR calc non Af Amer: 79 mL/min/{1.73_m2} (ref >59)
Globulin, Total: 2.4 g/dL (ref 1.5–4.5)
Glucose: 126 mg/dL — ABNORMAL HIGH (ref 65–99)
Potassium: 4.8 mmol/L (ref 3.5–5.2)
Sodium: 143 mmol/L (ref 134–144)
Total Protein: 7 g/dL (ref 6.0–8.5)

## 2019-05-31 LAB — CBC WITH DIFFERENTIAL/PLATELET
Basophils Absolute: 0 10*3/uL (ref 0.0–0.2)
Basos: 0 %
EOS (ABSOLUTE): 0.1 10*3/uL (ref 0.0–0.4)
Eos: 1 %
Hematocrit: 42.8 % (ref 37.5–51.0)
Hemoglobin: 13.9 g/dL (ref 13.0–17.7)
Immature Grans (Abs): 0 10*3/uL (ref 0.0–0.1)
Immature Granulocytes: 0 %
Lymphocytes Absolute: 1.6 10*3/uL (ref 0.7–3.1)
Lymphs: 22 %
MCH: 27.6 pg (ref 26.6–33.0)
MCHC: 32.5 g/dL (ref 31.5–35.7)
MCV: 85 fL (ref 79–97)
Monocytes Absolute: 0.6 10*3/uL (ref 0.1–0.9)
Monocytes: 9 %
Neutrophils Absolute: 5.1 10*3/uL (ref 1.4–7.0)
Neutrophils: 68 %
Platelets: 249 10*3/uL (ref 150–450)
RBC: 5.04 x10E6/uL (ref 4.14–5.80)
RDW: 13.3 % (ref 11.6–15.4)
WBC: 7.5 10*3/uL (ref 3.4–10.8)

## 2019-05-31 NOTE — Telephone Encounter (Signed)
-----   Message from Suzzanne Cloud, NP sent at 05/31/2019  9:24 AM EDT ----- Please call the patient. Lab work is unremarkable, with exception of elevated glucose of 126.

## 2019-05-31 NOTE — Telephone Encounter (Signed)
Mcarthur Rossetti Josem Kaufmann: [id number] (exp. 05/31/19 to 06/30/19) order sent to GI. They will reach out to the patient to schedule.

## 2019-05-31 NOTE — Telephone Encounter (Signed)
Called the patient to advise the lab work completed was normal with the exception of the slightly elevated blood glucose level. Advised it was slightly lower then year ago but something to be mindful of. Other lab work looked good. Pt verbalized understanding. Patient questioned about MRI and I advised the once insurance approval is gathered then someone will be in contact with him about getting scheduled for that. Pt verbalized understanding.

## 2019-06-01 ENCOUNTER — Ambulatory Visit: Payer: Medicare PPO | Admitting: Neurology

## 2019-06-05 NOTE — Progress Notes (Signed)
I have reviewed and agreed above plan. 

## 2019-06-13 DIAGNOSIS — E119 Type 2 diabetes mellitus without complications: Secondary | ICD-10-CM | POA: Diagnosis not present

## 2019-06-27 ENCOUNTER — Ambulatory Visit
Admission: RE | Admit: 2019-06-27 | Discharge: 2019-06-27 | Disposition: A | Discharge status: Home / Self Care | Payer: Medicare PPO | Source: Ambulatory Visit | Attending: Neurology | Admitting: Neurology

## 2019-06-27 DIAGNOSIS — G379 Demyelinating disease of central nervous system, unspecified: Secondary | ICD-10-CM | POA: Diagnosis not present

## 2019-06-27 DIAGNOSIS — G35 Multiple sclerosis: Secondary | ICD-10-CM | POA: Diagnosis not present

## 2019-06-27 MED ORDER — GADOBENATE DIMEGLUMINE 529 MG/ML IV SOLN
20.0000 mL | Freq: Once | INTRAVENOUS | Status: AC | PRN
  Administered 2019-06-27: 20 mL via INTRAVENOUS

## 2019-06-29 ENCOUNTER — Telehealth: Payer: Self-pay | Admitting: *Deleted

## 2019-06-29 NOTE — Telephone Encounter (Signed)
Spoke to pt and relayed that his MRI results per SS/NP were stable, no change from previous one 2019.  He verbalized understanding.

## 2019-06-29 NOTE — Telephone Encounter (Signed)
-----   Message from Suzzanne Cloud, NP sent at 06/28/2019  4:41 PM EDT ----- Please call the patient for MRI of the brain result. Is stable, no change from prior in 2019. He remains on Tecfidera.   MRI of the Brain  IMPRESSION:  MRI brain (with and without) demonstrating: -Extensive bilateral white matter T2 hyperintensities which are confluent, may be related to chronic demyelination or chronic ischemic changes. -No acute findings. No change from MRI on 05/29/18.

## 2019-07-29 DIAGNOSIS — M5430 Sciatica, unspecified side: Secondary | ICD-10-CM | POA: Diagnosis not present

## 2019-07-29 DIAGNOSIS — Z23 Encounter for immunization: Secondary | ICD-10-CM | POA: Diagnosis not present

## 2019-07-29 DIAGNOSIS — M549 Dorsalgia, unspecified: Secondary | ICD-10-CM | POA: Diagnosis not present

## 2019-08-29 DIAGNOSIS — E1140 Type 2 diabetes mellitus with diabetic neuropathy, unspecified: Secondary | ICD-10-CM | POA: Diagnosis not present

## 2019-08-29 DIAGNOSIS — I639 Cerebral infarction, unspecified: Secondary | ICD-10-CM | POA: Diagnosis not present

## 2019-08-29 DIAGNOSIS — E7800 Pure hypercholesterolemia, unspecified: Secondary | ICD-10-CM | POA: Diagnosis not present

## 2019-08-29 DIAGNOSIS — R269 Unspecified abnormalities of gait and mobility: Secondary | ICD-10-CM | POA: Diagnosis not present

## 2019-08-29 DIAGNOSIS — I10 Essential (primary) hypertension: Secondary | ICD-10-CM | POA: Diagnosis not present

## 2019-08-29 DIAGNOSIS — G35 Multiple sclerosis: Secondary | ICD-10-CM | POA: Diagnosis not present

## 2019-11-22 DIAGNOSIS — Z03818 Encounter for observation for suspected exposure to other biological agents ruled out: Secondary | ICD-10-CM | POA: Diagnosis not present

## 2019-11-28 ENCOUNTER — Telehealth: Payer: Self-pay

## 2019-11-28 NOTE — Telephone Encounter (Signed)
1) Medication(s) Requested (by name): Dimethyl Fumarate (TECFIDERA) 240 MG   2) Pharmacy of Choice: [facility] [place] [place] - [address]  [address] [place] [address]   3) Special Requests:

## 2019-11-30 MED ORDER — DIMETHYL FUMARATE 240 MG PO CPDR: 240.0000 mg | DELAYED_RELEASE_CAPSULE | Freq: Two times a day (BID) | ORAL | 3 refills | Status: DC

## 2019-11-30 NOTE — Telephone Encounter (Signed)
Refill sent for the patient to the pharmacy mentioned.  

## 2019-12-04 ENCOUNTER — Other Ambulatory Visit: Payer: Self-pay | Admitting: *Deleted

## 2019-12-04 MED ORDER — DIMETHYL FUMARATE 240 MG PO CPDR: 240.0000 mg | DELAYED_RELEASE_CAPSULE | Freq: Two times a day (BID) | ORAL | 3 refills | Status: DC

## 2019-12-04 NOTE — Progress Notes (Deleted)
PATIENT: Thomas Nixon DOB: 05-31-1971  REASON FOR VISIT: follow up HISTORY FROM: patient  HISTORY OF PRESENT ILLNESS: Today 12/04/19  HISTORY  HISTORY  Thomas Nixon a 49 years old male, seen in refer by his primary care doctor Wenda Low for evaluation of gait abnormality, initial evaluation was on November twelfth 2018.  I reviewed and summarized referring note, he has history of hypertension, hyperlipidemia, diabetes, I saw him in 2011, lost follow-up, saw him again 2015, then lost follow-up again,  He had multiple strokelike episode in the past, started since his age twenties, he was treated at different medical facility, including [facility], [facility], [facility].  I have reviewed records from [facility] in 2002, with a diagnosis of acute demyelinating lesions, affecting both left, and right side of the brain, with severe left visual field cut, left neglect.  VEP was normal.   Lab showed normal or negative RPR, HIV, B12, folate acid, Lyme titer, ANA, TSH, protein electrophoresis, mild elevated CPK  MRI at [facility]: In 2014 has demonstrated extensive periventricular white matter disease, evidence of previous right parietal biopsy, hemosiderin deposit.  MRA of the brain was normal. ultrasound of carotid artery showed no significant large vessel disease.  Records from [facility], dated August 2009 by neurologist Dr. Dallas Breeding:  Laboratory evaluation,normaal fasting lipid profile,LDL was 53, negative HIV, RPR,VDRL, Athena Notch-3 Gene Mutation negative. norml CBC, INR,, CMP, other than mildly elevated Glu 128, A1C 6.4, TSH 1.5.  TTE: normal.  There were also mention of brain biopsy with demyelinating features, he did have improvement with steroid treatment, biopsy was read at [facility] clinic.  Over the past few years, he complains of slow worsening gait difficulty, slurred speech, he denies visual trouble, he  denies bowel and bladder incontinence.  Per records from [facility] orthopedic clinics, he has evidence of right lumbar radiculopathy, with MRI notable for bilateral L3 pars defect, 10 mm spondylolisthesis at L3 and 4, increased on flexion radiograph, the supported by positive EMG findings, he had lumbar decompression surgery by Dr. Joan Flores in 2015, which has helped his low back pain,   MRI lumbar in January 2017, status post L3-4 fusion, no significant canal foraminal stenosis  MRI of brain in September 2015, confluent periventricular and subcortical T2 hyperintensity, hemosiderin deposit in the right frontal cortical and bilateral parietal periventricular region, consistent with chronic intracerebral hemorrhage. Right frontal craniotomy and biopsy sequelae noted, compared to MRI in 2010, there is mild progression of white matter disease.  MRI cervical spine, mild degenerative changes, there is no evidence of intrinsic spinal cord disease  He lives with his wife, take care of the house chore, he noted mild increased gait abnormality, slurred speech, mild worsening memory loss  UPDATE Dec 09 2017: He is accompanied by his wife at today's clinical visit, he continue complains of mild gait abnormality, memory loss, we were able to review MRI of the brain with and without contrast in November 2018, there was evidence of confluent white matter T2/FLAIR hyperintensity signal in the right greater than left hemisphere, possibility include genetic demyelinating disease, inflammatory demyelinating disease,  But based on previous brain biopsy in 2009, there was demyelinating features, he has significant symptom improvement with steroid treatment, suggestive of inflammatory component, he never received any long-term immunomodulation therapy,  I will refer him to lumbar puncture, he has missed multiple scheduled appointment in the past, we decided to proceed with ocrelizumab if lumbar puncture showed  typical  findings of elevated oligoclonal banding consistent with MS,  Update February 07, 2018: He is accompanied by his wife at today's clinical visit, who has known him since 2009, Mr. Kreis did have gradual decline over the past 10 years, worsening memory loss, gait abnormality, most recent flareups was In 2014, he presented with left side stroke like event  JC virus titer 2.83 in Jan 2019, his insurance has limited coverage for ocrelizumab, he wants to explore other treatment options, Tysabri is not a good option due to high JC virus titer  Spinal fluid testing showed more than 5 oligoclonal banding,  Laboratory evaluation showed normal negative very long chain fatty acid, arylsufatase A activity.  UPDATE May 16 2018: He tolerated Tecfidera very well, started since March 2019, no significant side effect noticed, there is no change in his functional status, physical therapy has been very helpful, still has gait difficulty, today he was noted to have pseudobulbar expression  UPDATE Jan 9th 2020: He seems to walk better, he tolerate Tecfidera bid,physical therapy was helpful,  We personally reviewed MRI of the brain with and without contrast in July 2019, no significant change compared to previous scan in 2018, confluent foci involving periventricular deep white matter is, in addition, there is some hemosiderin deposition scattered within the white matter,  Update May 30 2019 SS: Is still on Tecfidera BID. He has not had any flares. He denies numbness or weakness in arms or legs. No falls. No problems with bowels or bladder. No change is vision. He is on disability. He sleeps okay at night, but he wakes up in the night to use the bathroom. He lives with his wife. He has 3 adopted children. Speech is stable, he still talks fast. He has been on Tecfidera since March 2019, no problems. He feels more confident in himself, before he was having "flares that he thought were strokes". He walks  for exercise.   Update December 05, 2019 SS: He remains on Tecfidera 240 mg twice daily.  After last visit, his MRI of the brain was done, showed extensive bilateral white matter T2 hyperintensities which are confluent, may be related to chronic demyelination and chronic ischemic changes, no acute findings, no change from MRI 05/29/2018  REVIEW OF SYSTEMS: Out of a complete 14 system review of symptoms, the patient complains only of the following symptoms, and all other reviewed systems are negative.  ALLERGIES: Allergies  Allergen Reactions  . Pork-Derived Products Other (See Comments)    DIET RESTRICTIONS    HOME MEDICATIONS: Outpatient Medications Prior to Visit  Medication Sig Dispense Refill  . ACCU-CHEK GUIDE test strip     . amLODipine (NORVASC) 10 MG tablet Take 10 mg daily by mouth.  3  . atorvastatin (LIPITOR) 40 MG tablet Take 1 tablet (40 mg total) by mouth at bedtime. 30 tablet 1  . clopidogrel (PLAVIX) 75 MG tablet Take 75 mg daily by mouth.  1  . Dimethyl Fumarate (TECFIDERA) 240 MG CPDR Take 1 capsule (240 mg total) by mouth 2 (two) times daily. 180 capsule 3  . hydrochlorothiazide (HYDRODIURIL) 25 MG tablet Take 25 mg daily by mouth.  11  . irbesartan (AVAPRO) 300 MG tablet Take 300 mg daily by mouth.  11  . LYRICA 300 MG capsule Take 300 mg 2 (two) times daily by mouth.  2  . metFORMIN (GLUCOPHAGE) 500 MG tablet Take 500 mg by mouth 2 (two) times daily with a meal.    . traMADol (ULTRAM) 50 MG  tablet Take 50 mg 2 (two) times daily by mouth.  3   No facility-administered medications prior to visit.    PAST MEDICAL HISTORY: Past Medical History:  Diagnosis Date  . CVA (cerebral infarction)   . Diabetes mellitus without complication (HCC)    Type 2  . Encounter for long-term (current) use of other medications   . Headache(784.0)   . Hypertension   . MS (multiple sclerosis) (HCC)    possible  . Neuropathy   . Other and unspecified hyperlipidemia   . Stroke (HCC)     5 strokes  . Thoracic or lumbosacral neuritis or radiculitis, unspecified   . Unspecified late effects of cerebrovascular disease   . Unspecified sleep apnea    does not use cpap    PAST SURGICAL HISTORY: Past Surgical History:  Procedure Laterality Date  . ANTERIOR LAT LUMBAR FUSION N/A 08/30/2014   Procedure: ANTERIOR LATERAL LUMBAR FUSION 1 LEVEL;  Surgeon: Emilee Hero, MD;  Location: [facility] OR;  Service: Orthopedics;  Laterality: N/A;  Lumbar 3-4 lateral interbody fusion with instrumentation and allograft  . BRAIN BIOPSY      FAMILY HISTORY: Family History  Problem Relation Age of Onset  . High blood pressure Mother   . Diabetes Mother   . High blood pressure Father   . Stroke Maternal Grandmother   . Stroke Paternal Grandmother     SOCIAL HISTORY: Social History   Socioeconomic History  . Marital status: Married    Spouse name: [name]  . Number of children: 3  . Years of education: college  . Highest education level: Not on file  Occupational History    Comment: Disabled  Tobacco Use  . Smoking status: Light Tobacco Smoker    Types: Cigars  . Smokeless tobacco: Never Used  Substance and Sexual Activity  . Alcohol use: Yes    Comment: Once per month.  . Drug use: No  . Sexual activity: Not on file  Other Topics Concern  . Not on file  Social History Narrative   Patient is married and lives at home with his wife [name] .   Disabled.   Education [employer].   Right handed.   1 cup caffeine per day.   Social Determinants of Health   Financial Resource Strain:   . Difficulty of Paying Living Expenses: Not on file  Food Insecurity:   . Worried About Programme researcher, broadcasting/film/video in the Last Year: Not on file  . Ran Out of Food in the Last Year: Not on file  Transportation Needs:   . Lack of Transportation (Medical): Not on file  . Lack of Transportation (Non-Medical): Not on file  Physical Activity:   . Days of Exercise per Week: Not on file  . Minutes of  Exercise per Session: Not on file  Stress:   . Feeling of Stress : Not on file  Social Connections:   . Frequency of Communication with Friends and Family: Not on file  . Frequency of Social Gatherings with Friends and Family: Not on file  . Attends Religious Services: Not on file  . Active Member of Clubs or Organizations: Not on file  . Attends Banker Meetings: Not on file  . Marital Status: Not on file  Intimate Partner Violence:   . Fear of Current or Ex-Partner: Not on file  . Emotionally Abused: Not on file  . Physically Abused: Not on file  . Sexually Abused: Not on file  PHYSICAL EXAM  There were no vitals filed for this visit. There is no height or weight on file to calculate BMI.  Generalized: Well developed, in no acute distress   Neurological examination  Mentation: Alert oriented to time, place, history taking. Follows all commands speech and language fluent Cranial nerve II-XII: Pupils were equal round reactive to light. Extraocular movements were full, visual field were full on confrontational test. Facial sensation and strength were normal. Uvula tongue midline. Head turning and shoulder shrug  were normal and symmetric. Motor: The motor testing reveals 5 over 5 strength of all 4 extremities. Good symmetric motor tone is noted throughout.  Sensory: Sensory testing is intact to soft touch on all 4 extremities. No evidence of extinction is noted.  Coordination: Cerebellar testing reveals good finger-nose-finger and heel-to-shin bilaterally.  Gait and station: Gait is normal. Tandem gait is normal. Romberg is negative. No drift is seen.  Reflexes: Deep tendon reflexes are symmetric and normal bilaterally.   DIAGNOSTIC DATA (LABS, IMAGING, TESTING) - I reviewed patient records, labs, notes, testing and imaging myself where available.  Lab Results  Component Value Date   WBC 7.5 05/30/2019   HGB 13.9 05/30/2019   HCT 42.8 05/30/2019   MCV 85  05/30/2019   PLT 249 05/30/2019      Component Value Date/Time   NA 143 05/30/2019 1021   K 4.8 05/30/2019 1021   CL 103 05/30/2019 1021   CO2 23 05/30/2019 1021   GLUCOSE 126 (H) 05/30/2019 1021   GLUCOSE 102 (H) 08/22/2014 0905   BUN 24 05/30/2019 1021   CREATININE 1.12 05/30/2019 1021   CALCIUM 9.6 05/30/2019 1021   PROT 7.0 05/30/2019 1021   ALBUMIN 4.6 05/30/2019 1021   ALBUMIN 4.3 01/06/2018 0856   AST 20 05/30/2019 1021   ALT 26 05/30/2019 1021   ALKPHOS 42 05/30/2019 1021   BILITOT 0.3 05/30/2019 1021   GFRNONAA 79 05/30/2019 1021   GFRAA 91 05/30/2019 1021   Lab Results  Component Value Date   CHOL 142 06/02/2013   HDL 34 (L) 06/02/2013   LDLCALC 92 06/02/2013   TRIG 81 06/02/2013   CHOLHDL 4.2 06/02/2013   Lab Results  Component Value Date   HGBA1C 6.2 (H) 06/01/2013   Lab Results  Component Value Date   VITAMINB12 619 10/04/2017   Lab Results  Component Value Date   TSH 1.860 05/16/2018      ASSESSMENT AND PLAN 49 y.o. year old male  has a past medical history of CVA (cerebral infarction), Diabetes mellitus without complication (HCC), Encounter for long-term (current) use of other medications, Headache(784.0), Hypertension, MS (multiple sclerosis) (HCC), Neuropathy, Other and unspecified hyperlipidemia, Stroke [facility]), Thoracic or lumbosacral neuritis or radiculitis, unspecified, Unspecified late effects of cerebrovascular disease, and Unspecified sleep apnea. here with:  1.  Gait abnormality 2.  Abnormal MRI of the brain -MRI of the brain 06/28/2019 MRI brain (with and without) demonstrating: -Extensive bilateral white matter T2 hyperintensities which are confluent, may be related to chronic demyelination or chronic ischemic changes. -No acute findings. No change from MRI on 05/29/18. 3.  Possible relapsing remitting multiple sclerosis -CSF has more than 5 oligoclonal banding -JCV titer was elevated 2.83, he is not a good candidate for Tysabri -He has  been on Tecfidera since March 2019   I spent 15 minutes with the patient. 50% of this time was spent   Margie Ege, [place], DNP 12/04/2019, 1:24 PM [facility] [address] [place],  [place] [address] [phone]

## 2019-12-05 ENCOUNTER — Encounter: Payer: Self-pay | Admitting: Neurology

## 2019-12-05 ENCOUNTER — Ambulatory Visit: Payer: Medicare PPO | Admitting: Neurology

## 2019-12-06 NOTE — Progress Notes (Signed)
PATIENT: Thomas Nixon DOB: Mar 31, 1971  REASON FOR VISIT: follow up HISTORY FROM: patient  HISTORY OF PRESENT ILLNESS: Today 12/07/19  HISTORY  HISTORY  Thomas Nixon a 49 years old male, seen in refer by his primary care doctor Georgann Housekeeper for evaluation of gait abnormality, initial evaluation was on November twelfth 2018.  I reviewed and summarized referring note, he has history of hypertension, hyperlipidemia, diabetes, I saw him in 2011, lost follow-up, saw him again 2015, then lost follow-up again,  He had multiple strokelike episode in the past, started since his age twenties, he was treated at different medical facility, including [facility], [facility], [facility].  I have reviewed records from [facility] in 2002, with a diagnosis of acute demyelinating lesions, affecting both left, and right side of the brain, with severe left visual field cut, left neglect.  VEP was normal.   Lab showed normal or negative RPR, HIV, B12, folate acid, Lyme titer, ANA, TSH, protein electrophoresis, mild elevated CPK  MRI at [facility]: In 2014 has demonstrated extensive periventricular white matter disease, evidence of previous right parietal biopsy, hemosiderin deposit.  MRA of the brain was normal. ultrasound of carotid artery showed no significant large vessel disease.  Records from [facility], dated August 2009 by neurologist Dr. Lillia Dallas:  Laboratory evaluation,normaal fasting lipid profile,LDL was 53, negative HIV, RPR,VDRL, Athena Notch-3 Gene Mutation negative. norml CBC, INR,, CMP, other than mildly elevated Glu 128, A1C 6.4, TSH 1.5.  TTE: normal.  There were also mention of brain biopsy with demyelinating features, he did have improvement with steroid treatment, biopsy was read at [facility] clinic.  Over the past few years, he complains of slow worsening gait difficulty, slurred speech, he denies visual trouble, he  denies bowel and bladder incontinence.  Per records from [facility] orthopedic clinics, he has evidence of right lumbar radiculopathy, with MRI notable for bilateral L3 pars defect, 10 mm spondylolisthesis at L3 and 4, increased on flexion radiograph, the supported by positive EMG findings, he had lumbar decompression surgery by Dr. Ottis Stain in 2015, which has helped his low back pain,   MRI lumbar in January 2017, status post L3-4 fusion, no significant canal foraminal stenosis  MRI of brain in September 2015, confluent periventricular and subcortical T2 hyperintensity, hemosiderin deposit in the right frontal cortical and bilateral parietal periventricular region, consistent with chronic intracerebral hemorrhage. Right frontal craniotomy and biopsy sequelae noted, compared to MRI in 2010, there is mild progression of white matter disease.  MRI cervical spine, mild degenerative changes, there is no evidence of intrinsic spinal cord disease  He lives with his wife, take care of the house chore, he noted mild increased gait abnormality, slurred speech, mild worsening memory loss  UPDATE Dec 09 2017: He is accompanied by his wife at today's clinical visit, he continue complains of mild gait abnormality, memory loss, we were able to review MRI of the brain with and without contrast in November 2018, there was evidence of confluent white matter T2/FLAIR hyperintensity signal in the right greater than left hemisphere, possibility include genetic demyelinating disease, inflammatory demyelinating disease,  But based on previous brain biopsy in 2009, there was demyelinating features, he has significant symptom improvement with steroid treatment, suggestive of inflammatory component, he never received any long-term immunomodulation therapy,  I will refer him to lumbar puncture, he has missed multiple scheduled appointment in the past, we decided to proceed with ocrelizumab if lumbar puncture showed  typical  findings of elevated oligoclonal banding consistent with MS,  Update February 07, 2018: He is accompanied by his wife at today's clinical visit, who has known him since 2009, Mr. Reamy did have gradual decline over the past 10 years, worsening memory loss, gait abnormality, most recent flareups was In 2014, he presented with left side stroke like event  JC virus titer 2.83 in Jan 2019, his insurance has limited coverage for ocrelizumab, he wants to explore other treatment options, Tysabri is not a good option due to high JC virus titer  Spinal fluid testing showed more than 5 oligoclonal banding,  Laboratory evaluation showed normal negative very long chain fatty acid, arylsufatase A activity.  UPDATE May 16 2018: He tolerated Tecfidera very well, started since March 2019, no significant side effect noticed, there is no change in his functional status, physical therapy has been very helpful, still has gait difficulty, today he was noted to have pseudobulbar expression  UPDATE Jan 9th 2020: He seems to walk better, he tolerate Tecfidera bid,physical therapy was helpful,  We personally reviewed MRI of the brain with and without contrast in July 2019, no significant change compared to previous scan in 2018, confluent foci involving periventricular deep white matter is, in addition, there is some hemosiderin deposition scattered within the white matter,  Update May 30 2019 SS: Is still on Tecfidera BID. He has not had any flares. He denies numbness or weakness in arms or legs. No falls. No problems with bowels or bladder. No change is vision. He is on disability. He sleeps okay at night, but he wakes up in the night to use the bathroom. He lives with his wife. He has 3 adopted children. Speech is stable, he still talks fast. He has been on Tecfidera since March 2019, no problems. He feels more confident in himself, before he was having "flares that he thought were strokes". He walks  for exercise.   Update December 07, 2019 SS: MRI of the brain 06/27/2019 no acute findings, no change from prior MRI 05/29/2018.  He remains on Tecfidera 240 mg twice daily, tolerating medication without side effect.  He denies any new problems or concerns, no overall change.  I talked with his wife via FaceTime, she indicates he has been doing well, she has not noticed any problems.  No falls, does have some chronic gait instability and trouble with memory. There has been an issue with insurance, has been out of his medication for a few days.   MRI of the brain 06/27/2019 MRI brain (with and without) demonstrating: -Extensive bilateral white matter T2 hyperintensities which are confluent, may be related to chronic demyelination or chronic ischemic changes. -No acute findings. No change from MRI on 05/29/18.  REVIEW OF SYSTEMS: Out of a complete 14 system review of symptoms, the patient complains only of the following symptoms, and all other reviewed systems are negative.  Gait abnormality  ALLERGIES: Allergies  Allergen Reactions  . Pork-Derived Products Other (See Comments)    DIET RESTRICTIONS    HOME MEDICATIONS: Outpatient Medications Prior to Visit  Medication Sig Dispense Refill  . ACCU-CHEK GUIDE test strip     . amLODipine (NORVASC) 10 MG tablet Take 10 mg daily by mouth.  3  . atorvastatin (LIPITOR) 40 MG tablet Take 1 tablet (40 mg total) by mouth at bedtime. 30 tablet 1  . Dimethyl Fumarate (TECFIDERA) 240 MG CPDR Take 1 capsule (240 mg total) by mouth 2 (two) times daily. 180 capsule 3  .  hydrochlorothiazide (HYDRODIURIL) 25 MG tablet Take 25 mg daily by mouth.  11  . irbesartan (AVAPRO) 300 MG tablet Take 300 mg daily by mouth.  11  . LYRICA 300 MG capsule Take 300 mg 2 (two) times daily by mouth.  2  . metFORMIN (GLUCOPHAGE) 500 MG tablet Take 500 mg by mouth 2 (two) times daily with a meal.    . traMADol (ULTRAM) 50 MG tablet Take 50 mg 2 (two) times daily by mouth.  3  .  clopidogrel (PLAVIX) 75 MG tablet Take 75 mg daily by mouth.  1   No facility-administered medications prior to visit.    PAST MEDICAL HISTORY: Past Medical History:  Diagnosis Date  . CVA (cerebral infarction)   . Diabetes mellitus without complication (HCC)    Type 2  . Encounter for long-term (current) use of other medications   . Headache(784.0)   . Hypertension   . MS (multiple sclerosis) (HCC)    possible  . Neuropathy   . Other and unspecified hyperlipidemia   . Stroke (HCC)    5 strokes  . Thoracic or lumbosacral neuritis or radiculitis, unspecified   . Unspecified late effects of cerebrovascular disease   . Unspecified sleep apnea    does not use cpap    PAST SURGICAL HISTORY: Past Surgical History:  Procedure Laterality Date  . ANTERIOR LAT LUMBAR FUSION N/A 08/30/2014   Procedure: ANTERIOR LATERAL LUMBAR FUSION 1 LEVEL;  Surgeon: Emilee Hero, MD;  Location: [facility] OR;  Service: Orthopedics;  Laterality: N/A;  Lumbar 3-4 lateral interbody fusion with instrumentation and allograft  . BRAIN BIOPSY      FAMILY HISTORY: Family History  Problem Relation Age of Onset  . High blood pressure Mother   . Diabetes Mother   . High blood pressure Father   . Stroke Maternal Grandmother   . Stroke Paternal Grandmother     SOCIAL HISTORY: Social History   Socioeconomic History  . Marital status: Married    Spouse name: [name]  . Number of children: 3  . Years of education: college  . Highest education level: Not on file  Occupational History    Comment: Disabled  Tobacco Use  . Smoking status: Light Tobacco Smoker    Types: Cigars  . Smokeless tobacco: Never Used  Substance and Sexual Activity  . Alcohol use: Yes    Comment: Once per month.  . Drug use: No  . Sexual activity: Not on file  Other Topics Concern  . Not on file  Social History Narrative   Patient is married and lives at home with his wife [name] .   Disabled.   Education [employer].    Right handed.   1 cup caffeine per day.   Social Determinants of Health   Financial Resource Strain:   . Difficulty of Paying Living Expenses: Not on file  Food Insecurity:   . Worried About Programme researcher, broadcasting/film/video in the Last Year: Not on file  . Ran Out of Food in the Last Year: Not on file  Transportation Needs:   . Lack of Transportation (Medical): Not on file  . Lack of Transportation (Non-Medical): Not on file  Physical Activity:   . Days of Exercise per Week: Not on file  . Minutes of Exercise per Session: Not on file  Stress:   . Feeling of Stress : Not on file  Social Connections:   . Frequency of Communication with Friends and Family: Not on file  .  Frequency of Social Gatherings with Friends and Family: Not on file  . Attends Religious Services: Not on file  . Active Member of Clubs or Organizations: Not on file  . Attends Banker Meetings: Not on file  . Marital Status: Not on file  Intimate Partner Violence:   . Fear of Current or Ex-Partner: Not on file  . Emotionally Abused: Not on file  . Physically Abused: Not on file  . Sexually Abused: Not on file   PHYSICAL EXAM  Vitals:   12/07/19 1109  BP: (!) 158/97  Pulse: 80  Temp: 97.6 F (36.4 C)  Weight: 250 lb (113.4 kg)  Height: 5\' 9"  (1.753 m)   Body mass index is 36.92 kg/m.  Generalized: Well developed, in no acute distress   Neurological examination  Mentation: Alert oriented to time, place, history taking. Follows all commands speech and language fluent, speech is somewhat fast Cranial nerve II-XII: Pupils were equal round reactive to light. Extraocular movements were full, visual field were full on confrontational test. Facial sensation and strength were normal.  Head turning and shoulder shrug were normal and symmetric. Motor: The motor testing reveals 5 over 5 strength of all 4 extremities. Good symmetric motor tone is noted throughout.  Sensory: Sensory testing is intact to soft touch  on all 4 extremities. No evidence of extinction is noted.  Coordination: Cerebellar testing reveals good finger-nose-finger and heel-to-shin bilaterally.  Gait and station: Gait is normal, but has limping type gait on the right, tandem gait was steady. Reflexes: Deep tendon reflexes are symmetric and normal bilaterally.   DIAGNOSTIC DATA (LABS, IMAGING, TESTING) - I reviewed patient records, labs, notes, testing and imaging myself where available.  Lab Results  Component Value Date   WBC 7.5 05/30/2019   HGB 13.9 05/30/2019   HCT 42.8 05/30/2019   MCV 85 05/30/2019   PLT 249 05/30/2019      Component Value Date/Time   NA 143 05/30/2019 1021   K 4.8 05/30/2019 1021   CL 103 05/30/2019 1021   CO2 23 05/30/2019 1021   GLUCOSE 126 (H) 05/30/2019 1021   GLUCOSE 102 (H) 08/22/2014 0905   BUN 24 05/30/2019 1021   CREATININE 1.12 05/30/2019 1021   CALCIUM 9.6 05/30/2019 1021   PROT 7.0 05/30/2019 1021   ALBUMIN 4.6 05/30/2019 1021   ALBUMIN 4.3 01/06/2018 0856   AST 20 05/30/2019 1021   ALT 26 05/30/2019 1021   ALKPHOS 42 05/30/2019 1021   BILITOT 0.3 05/30/2019 1021   GFRNONAA 79 05/30/2019 1021   GFRAA 91 05/30/2019 1021   Lab Results  Component Value Date   CHOL 142 06/02/2013   HDL 34 (L) 06/02/2013   LDLCALC 92 06/02/2013   TRIG 81 06/02/2013   CHOLHDL 4.2 06/02/2013   Lab Results  Component Value Date   HGBA1C 6.2 (H) 06/01/2013   Lab Results  Component Value Date   VITAMINB12 619 10/04/2017   Lab Results  Component Value Date   TSH 1.860 05/16/2018   ASSESSMENT AND PLAN 49 y.o. year old male  has a past medical history of CVA (cerebral infarction), Diabetes mellitus without complication (HCC), Encounter for long-term (current) use of other medications, Headache(784.0), Hypertension, MS (multiple sclerosis) (HCC), Neuropathy, Other and unspecified hyperlipidemia, Stroke [facility]), Thoracic or lumbosacral neuritis or radiculitis, unspecified, Unspecified late  effects of cerebrovascular disease, and Unspecified sleep apnea. here with:  1.  Abnormal MRI of the brain MRI brain (with and without) demonstrating: 06/27/2019 -Extensive bilateral  white matter T2 hyperintensities which are confluent, may be related to chronic demyelination or chronic ischemic changes. -No acute findings. No change from MRI on 05/29/18. 2.  Possible relapsing remitting multiple sclerosis -CSF has more than 5 oligoclonal banding -JCV titer elevated 2.83, patient is not a good candidate for Tysabri -Has been on Tecfidera since March 2019, tolerating it well, no acute symptoms -Check CBC with diff, CMP today -Continue Tecfidera 240 mg, twice daily (nurse calling to help patient get his medication, is now generic) -Follow-up in 6 months or sooner if needed  I spent 15 minutes with the patient. 50% of this time was spent discussing his plan of care.  Margie Ege, AGNP-C, DNP 12/07/2019, 11:24 AM [facility] [address], [address] [place], [place] [address] [phone]

## 2019-12-07 ENCOUNTER — Other Ambulatory Visit: Payer: Self-pay

## 2019-12-07 ENCOUNTER — Encounter: Payer: Self-pay | Admitting: Neurology

## 2019-12-07 ENCOUNTER — Ambulatory Visit: Payer: Medicare PPO | Admitting: Neurology

## 2019-12-07 ENCOUNTER — Telehealth: Payer: Self-pay

## 2019-12-07 ENCOUNTER — Telehealth: Payer: Self-pay | Admitting: Neurology

## 2019-12-07 VITALS — BP 158/97 | HR 80 | Temp 97.6°F | Ht 69.0 in | Wt 250.0 lb

## 2019-12-07 DIAGNOSIS — G35 Multiple sclerosis: Secondary | ICD-10-CM | POA: Diagnosis not present

## 2019-12-07 NOTE — Telephone Encounter (Signed)
PA Case: [id number], Status: Approved, Coverage Starts on: 11/24/2019 12:00:00 AM, Coverage Ends on: 11/22/2020 12:00:00 AM. Questions? Contact [phone].

## 2019-12-07 NOTE — Telephone Encounter (Signed)
Spoke with Renee with [facility] to see why the patient hasn't received his Tecfidera yet. She stated that they had current insurance information for the patient. I provided patient's current insurance information. She ran his insurance and it was rejected 3 times. She then reached out to the help desk and they advised her that requesting a 90 day supply exceeded his plan limitations. She then ran it for a 30 day supply where it was rejected once again due to it not being covered on his formulary(Brand and Generic). PA is required.

## 2019-12-07 NOTE — Patient Instructions (Signed)
Continue Tecfidera, check lab work today, see you in 6 months

## 2019-12-07 NOTE — Telephone Encounter (Signed)
Pt stopped by front desk after appointment today requesting that someone gives him a call to answer some questions he has regarding his insurance and medication. Please advise.

## 2019-12-07 NOTE — Telephone Encounter (Signed)
Urgent PA for Dimethyl Fumarate has been initiated on CMM. Key: [id number]. PA Case ID: [id number]  I will update once a decision has been made.

## 2019-12-07 NOTE — Telephone Encounter (Signed)
An urgent PA has been initiated on CMM. Patient out of medication has been noted in the prior authorization. I will keep tabs on this until I leave today.

## 2019-12-08 LAB — COMPREHENSIVE METABOLIC PANEL
ALT: 14 IU/L (ref 0–44)
AST: 16 IU/L (ref 0–40)
Albumin/Globulin Ratio: 2 (ref 1.2–2.2)
Albumin: 4.5 g/dL (ref 4.0–5.0)
Alkaline Phosphatase: 44 IU/L (ref 39–117)
BUN/Creatinine Ratio: 18 (ref 9–20)
BUN: 16 mg/dL (ref 6–24)
Bilirubin Total: 0.4 mg/dL (ref 0.0–1.2)
CO2: 26 mmol/L (ref 20–29)
Calcium: 9.3 mg/dL (ref 8.7–10.2)
Chloride: 100 mmol/L (ref 96–106)
Creatinine, Ser: 0.89 mg/dL (ref 0.76–1.27)
GFR calc Af Amer: 119 mL/min/{1.73_m2} (ref >59)
GFR calc non Af Amer: 103 mL/min/{1.73_m2} (ref >59)
Globulin, Total: 2.3 g/dL (ref 1.5–4.5)
Glucose: 193 mg/dL — ABNORMAL HIGH (ref 65–99)
Potassium: 3.9 mmol/L (ref 3.5–5.2)
Sodium: 139 mmol/L (ref 134–144)
Total Protein: 6.8 g/dL (ref 6.0–8.5)

## 2019-12-08 LAB — CBC WITH DIFFERENTIAL/PLATELET
Basophils Absolute: 0 10*3/uL (ref 0.0–0.2)
Basos: 1 %
EOS (ABSOLUTE): 0.1 10*3/uL (ref 0.0–0.4)
Eos: 1 %
Hematocrit: 44.5 % (ref 37.5–51.0)
Hemoglobin: 14.5 g/dL (ref 13.0–17.7)
Immature Grans (Abs): 0 10*3/uL (ref 0.0–0.1)
Immature Granulocytes: 0 %
Lymphocytes Absolute: 1.8 10*3/uL (ref 0.7–3.1)
Lymphs: 29 %
MCH: 27.9 pg (ref 26.6–33.0)
MCHC: 32.6 g/dL (ref 31.5–35.7)
MCV: 86 fL (ref 79–97)
Monocytes Absolute: 0.5 10*3/uL (ref 0.1–0.9)
Monocytes: 7 %
Neutrophils Absolute: 3.9 10*3/uL (ref 1.4–7.0)
Neutrophils: 62 %
Platelets: 206 10*3/uL (ref 150–450)
RBC: 5.19 x10E6/uL (ref 4.14–5.80)
RDW: 13 % (ref 11.6–15.4)
WBC: 6.2 10*3/uL (ref 3.4–10.8)

## 2019-12-11 ENCOUNTER — Telehealth: Payer: Self-pay

## 2019-12-11 NOTE — Telephone Encounter (Signed)
Called pt and went over recent labs per NP Lyndle Herrlich request.   "Please call the patient, labs look good while on Tecfidera, random glucose is 193, which is elevated."-NP SS  Pt did ask about the Tecfidera, I relayed to the pt that a Co-worker did start and get Approval for the medication on 12/07/19 (please see coworkers phone note on this date). Pt had no futher questions.

## 2019-12-18 ENCOUNTER — Telehealth: Payer: Self-pay

## 2019-12-18 NOTE — Telephone Encounter (Signed)
I do not see that we have ever filled this for him.

## 2019-12-18 NOTE — Telephone Encounter (Signed)
[place] database says that the Tramadol was filled on 12-04-2019 for 60 tablets. Spoke with the patient and he stated that he didn't receive anything. I spoke with the pharmacy and they were able to tell me that the patient picked up the prescription on 12-04-2019. Prescription was filled by his pcp Dr. Georgann Housekeeper. Please advise

## 2019-12-18 NOTE — Telephone Encounter (Signed)
1) Medication(s) Requested (by name): traMADol (ULTRAM) 50 MG tablet   2) [facility] - [address]  [address] [place], [place] [address]

## 2019-12-19 NOTE — Telephone Encounter (Signed)
Noted! Thank you

## 2019-12-19 NOTE — Telephone Encounter (Signed)
Spoke with Joni Reining from Dr. Venita Sheffield office and she stated that his prescription was written on October 02, 2019 with 4 refills. Dr. Donette Larry is the one prescribing and refilling the medication.

## 2019-12-19 NOTE — Telephone Encounter (Signed)
Contact PCP. I do not see that we have ever filled this for him. It has been filled by Georgann Housekeeper.

## 2019-12-19 NOTE — Telephone Encounter (Signed)
Left message with Dr. Venita Sheffield nurse Joni Reining to give me a call back to discuss his Tramadol refill. Office number was provided for a call back.    If Joni Reining calls back please ask her did Dr. Donette Larry refill the patient's Tramadol on 12-04-2019 because that's what it shows on his drug registry.

## 2019-12-19 NOTE — Telephone Encounter (Signed)
FYI Pt called back, the message from CMA [place] was relayed to him, pt states he will contact his PCP's office about the medication. No call back requested

## 2019-12-19 NOTE — Telephone Encounter (Signed)
Unable to get in contact with the patient. LVM letting him know that I spoke with his PCPs office and they stated he will need to contact them for a refill. He was written a refill in Nov 2020 with 4 refills. He was advised to contact their office in regards to this medication. Office number was provided in case he has any other questions or concerns.

## 2019-12-20 ENCOUNTER — Telehealth: Payer: Self-pay

## 2019-12-20 NOTE — Telephone Encounter (Signed)
Thomas Nixon with [facility] called to confirm if the patient has received a starter kit for the Dimethyl Fumarate (TECFIDERA) 240 MG CPDR and if not if a prescription could be called in if needed or if okay to dispense medication as is. Please follow up

## 2019-12-21 MED ORDER — DIMETHYL FUMARATE STARTER PACK 120 & 240 MG PO MISC: ORAL | 0 refills | Status: AC

## 2019-12-21 NOTE — Telephone Encounter (Signed)
I called and spoke to pt and he has not had tecfidera / dimethyl fumarte for a month.  Will need starter pack. [facility]. Done.

## 2019-12-26 NOTE — Progress Notes (Signed)
I have reviewed and agreed above plan. 

## 2020-01-23 ENCOUNTER — Telehealth: Payer: Self-pay | Admitting: Neurology

## 2020-01-23 NOTE — Telephone Encounter (Signed)
Patient called wanting to know if he should continue taking his medication Dimethyl Fumarate (TECFIDERA) 240 MG CPDR

## 2020-01-23 NOTE — Telephone Encounter (Signed)
I called pt back.  He took last does of dimethyl fumarte last night.  His [facility] has finanacial assistance program and he sent them fianancials for them to review.  (his copay is $1000).  They are to let him know in 2-3 days.  Other option that he relayed to me that were given to him were copaxone, glatiamir, and betaseron).  I will relay to SS/NP. Pt to call me once he hears something.

## 2020-01-23 NOTE — Telephone Encounter (Signed)
Pt has returned the call to [place], [place] please call back

## 2020-02-07 ENCOUNTER — Other Ambulatory Visit: Payer: Self-pay | Admitting: Neurology

## 2020-02-27 ENCOUNTER — Telehealth: Payer: Self-pay | Admitting: Neurology

## 2020-02-27 NOTE — Telephone Encounter (Signed)
Pt called needing to discuss his medications with the RN. Please advise.

## 2020-02-27 NOTE — Telephone Encounter (Signed)
I called pt and he relayed that [facility] was to get in touch with [place] about his dimethyl fumarate. I will try to call tomorrow.  He is out of medication.

## 2020-02-28 ENCOUNTER — Other Ambulatory Visit: Payer: Self-pay | Admitting: Neurology

## 2020-02-28 NOTE — Telephone Encounter (Signed)
I called pt after [facility] to [facility].  Copay is $1000.  Not sure if this part of deductible OOP.  He will check into this.  Needymeds option, Mylan is Primary school teacher.  He will let us know what he decides.  Other option are as listed below. FYI at this point.  Pt to get back to me.

## 2020-02-28 NOTE — Telephone Encounter (Signed)
We could try for Vumerity simultaneously. I don't want him without medication. Should consider other options for MS treatment if there is going to be significant delay. Please keep me posted, rely the importance to the patient.

## 2020-02-29 NOTE — Telephone Encounter (Signed)
LMVM for pt that I was calling about his medication.

## 2020-03-04 DIAGNOSIS — Z125 Encounter for screening for malignant neoplasm of prostate: Secondary | ICD-10-CM | POA: Diagnosis not present

## 2020-03-04 DIAGNOSIS — E7800 Pure hypercholesterolemia, unspecified: Secondary | ICD-10-CM | POA: Diagnosis not present

## 2020-03-04 DIAGNOSIS — G4733 Obstructive sleep apnea (adult) (pediatric): Secondary | ICD-10-CM | POA: Diagnosis not present

## 2020-03-04 DIAGNOSIS — R4701 Aphasia: Secondary | ICD-10-CM | POA: Diagnosis not present

## 2020-03-04 DIAGNOSIS — M48061 Spinal stenosis, lumbar region without neurogenic claudication: Secondary | ICD-10-CM | POA: Diagnosis not present

## 2020-03-04 DIAGNOSIS — G35 Multiple sclerosis: Secondary | ICD-10-CM | POA: Diagnosis not present

## 2020-03-04 DIAGNOSIS — E1140 Type 2 diabetes mellitus with diabetic neuropathy, unspecified: Secondary | ICD-10-CM | POA: Diagnosis not present

## 2020-03-04 DIAGNOSIS — Z6835 Body mass index (BMI) 35.0-35.9, adult: Secondary | ICD-10-CM | POA: Diagnosis not present

## 2020-04-05 DIAGNOSIS — Z1389 Encounter for screening for other disorder: Secondary | ICD-10-CM | POA: Diagnosis not present

## 2020-04-05 DIAGNOSIS — Z0000 Encounter for general adult medical examination without abnormal findings: Secondary | ICD-10-CM | POA: Diagnosis not present

## 2020-05-06 ENCOUNTER — Telehealth: Payer: Self-pay | Admitting: *Deleted

## 2020-05-06 NOTE — Telephone Encounter (Signed)
Signed paperwork for Vumerity switch.

## 2020-05-06 NOTE — Telephone Encounter (Addendum)
Fax confirmation received from vumerity.

## 2020-05-06 NOTE — Telephone Encounter (Signed)
It is ok to switch to genertic

## 2020-05-06 NOTE — Telephone Encounter (Signed)
Spoke to pt and he relayed that the generic tecfidera is costing him $1000.00 month.  He needs something else.  Vumerity was discussed, will fill out form for him as SS/NP had spoken to him previously.  He will need to sign, possibly they may speak to him over phone, if not will need to come in and sign enrollment.  This will be done urgent.

## 2020-05-09 NOTE — Telephone Encounter (Signed)
Received fax from [employer] that vumerity (quick start program).  has been shipped  (dispensing pham #).   [phone], fax [phone]. ID [id number].

## 2020-05-09 NOTE — Telephone Encounter (Signed)
Received CMM [employer] needing PA ofn vumerity.  This was done and sent 05-09-20 1213.

## 2020-05-13 MED ORDER — VUMERITY 231 MG PO CPDR: DELAYED_RELEASE_CAPSULE | ORAL | 11 refills | Status: DC

## 2020-05-13 NOTE — Addendum Note (Signed)
Addended by: Guy Begin on: 05/13/2020 07:39 AM   Modules accepted: Orders

## 2020-05-13 NOTE — Telephone Encounter (Signed)
Received approval for vumerity 231mg  capsules.  Good until 21-31-21.  [phone]. ID [id number].

## 2020-06-07 NOTE — Telephone Encounter (Signed)
[facility] is asking for a call from RN to clarify if pt is on (VUMERITY) If pt is supposed to be on (VUMERITY) and no longer the Diroximel Fumarate a prescription is needed.  Toniann Fail @ [facility]  Thomas Nixon pt told them that he is on (VUMERITY) and not Diroximel Fumarate

## 2020-06-10 MED ORDER — VUMERITY 231 MG PO CPDR: DELAYED_RELEASE_CAPSULE | ORAL | 11 refills | Status: DC

## 2020-06-10 NOTE — Telephone Encounter (Signed)
[facility] left on [facility] for quite some time.  I sent in prescription for VUMERITY.

## 2020-06-10 NOTE — Addendum Note (Signed)
Addended by: Guy Begin on: 06/10/2020 09:58 AM   Modules accepted: Orders

## 2020-06-11 ENCOUNTER — Encounter: Payer: Self-pay | Admitting: Neurology

## 2020-06-11 ENCOUNTER — Ambulatory Visit: Payer: Medicare PPO | Admitting: Neurology

## 2020-06-11 ENCOUNTER — Other Ambulatory Visit: Payer: Self-pay

## 2020-06-11 VITALS — BP 153/97 | HR 76 | Ht 69.0 in | Wt 238.0 lb

## 2020-06-11 DIAGNOSIS — R269 Unspecified abnormalities of gait and mobility: Secondary | ICD-10-CM

## 2020-06-11 DIAGNOSIS — R7309 Other abnormal glucose: Secondary | ICD-10-CM | POA: Diagnosis not present

## 2020-06-11 DIAGNOSIS — E559 Vitamin D deficiency, unspecified: Secondary | ICD-10-CM

## 2020-06-11 DIAGNOSIS — G35 Multiple sclerosis: Secondary | ICD-10-CM

## 2020-06-11 DIAGNOSIS — R799 Abnormal finding of blood chemistry, unspecified: Secondary | ICD-10-CM | POA: Diagnosis not present

## 2020-06-11 DIAGNOSIS — E538 Deficiency of other specified B group vitamins: Secondary | ICD-10-CM | POA: Diagnosis not present

## 2020-06-11 DIAGNOSIS — R7989 Other specified abnormal findings of blood chemistry: Secondary | ICD-10-CM | POA: Diagnosis not present

## 2020-06-11 NOTE — Progress Notes (Signed)
PATIENT: Thomas Nixon DOB: Mar 31, 1971  REASON FOR VISIT: follow up HISTORY FROM: patient  HISTORY OF PRESENT ILLNESS: Today 12/07/19  HISTORY  HISTORY  Thomas Nixon a 49 years old male, seen in refer by his primary care doctor Georgann Housekeeper for evaluation of gait abnormality, initial evaluation was on November twelfth 2018.  I reviewed and summarized referring note, he has history of hypertension, hyperlipidemia, diabetes, I saw him in 2011, lost follow-up, saw him again 2015, then lost follow-up again,  He had multiple strokelike episode in the past, started since his age twenties, he was treated at different medical facility, including [facility], [facility], [facility].  I have reviewed records from [facility] in 2002, with a diagnosis of acute demyelinating lesions, affecting both left, and right side of the brain, with severe left visual field cut, left neglect.  VEP was normal.   Lab showed normal or negative RPR, HIV, B12, folate acid, Lyme titer, ANA, TSH, protein electrophoresis, mild elevated CPK  MRI at [facility]: In 2014 has demonstrated extensive periventricular white matter disease, evidence of previous right parietal biopsy, hemosiderin deposit.  MRA of the brain was normal. ultrasound of carotid artery showed no significant large vessel disease.  Records from [facility], dated August 2009 by neurologist Dr. Lillia Dallas:  Laboratory evaluation,normaal fasting lipid profile,LDL was 53, negative HIV, RPR,VDRL, Athena Notch-3 Gene Mutation negative. norml CBC, INR,, CMP, other than mildly elevated Glu 128, A1C 6.4, TSH 1.5.  TTE: normal.  There were also mention of brain biopsy with demyelinating features, he did have improvement with steroid treatment, biopsy was read at [facility] clinic.  Over the past few years, he complains of slow worsening gait difficulty, slurred speech, he denies visual trouble, he  denies bowel and bladder incontinence.  Per records from [facility] orthopedic clinics, he has evidence of right lumbar radiculopathy, with MRI notable for bilateral L3 pars defect, 10 mm spondylolisthesis at L3 and 4, increased on flexion radiograph, the supported by positive EMG findings, he had lumbar decompression surgery by Dr. Ottis Stain in 2015, which has helped his low back pain,   MRI lumbar in January 2017, status post L3-4 fusion, no significant canal foraminal stenosis  MRI of brain in September 2015, confluent periventricular and subcortical T2 hyperintensity, hemosiderin deposit in the right frontal cortical and bilateral parietal periventricular region, consistent with chronic intracerebral hemorrhage. Right frontal craniotomy and biopsy sequelae noted, compared to MRI in 2010, there is mild progression of white matter disease.  MRI cervical spine, mild degenerative changes, there is no evidence of intrinsic spinal cord disease  He lives with his wife, take care of the house chore, he noted mild increased gait abnormality, slurred speech, mild worsening memory loss  UPDATE Dec 09 2017: He is accompanied by his wife at today's clinical visit, he continue complains of mild gait abnormality, memory loss, we were able to review MRI of the brain with and without contrast in November 2018, there was evidence of confluent white matter T2/FLAIR hyperintensity signal in the right greater than left hemisphere, possibility include genetic demyelinating disease, inflammatory demyelinating disease,  But based on previous brain biopsy in 2009, there was demyelinating features, he has significant symptom improvement with steroid treatment, suggestive of inflammatory component, he never received any long-term immunomodulation therapy,  I will refer him to lumbar puncture, he has missed multiple scheduled appointment in the past, we decided to proceed with ocrelizumab if lumbar puncture showed  typical  findings of elevated oligoclonal banding consistent with MS,  Update February 07, 2018: He is accompanied by his wife at today's clinical visit, who has known him since 2009, [name] did have gradual decline over the past 10 years, worsening memory loss, gait abnormality, most recent flareups was In 2014, he presented with left side stroke like event  JC virus titer 2.83 in Jan 2019, his insurance has limited coverage for ocrelizumab, he wants to explore other treatment options, Tysabri is not a good option due to high JC virus titer  Spinal fluid testing showed more than 5 oligoclonal banding,  Laboratory evaluation showed normal negative very long chain fatty acid, arylsufatase A activity.  UPDATE May 16 2018: He tolerated Tecfidera very well, started since March 2019, no significant side effect noticed, there is no change in his functional status, physical therapy has been very helpful, still has gait difficulty, today he was noted to have pseudobulbar expression  UPDATE Jan 9th 2020: He seems to walk better, he tolerate Tecfidera bid,physical therapy was helpful,  We personally reviewed MRI of the brain with and without contrast in July 2019, no significant change compared to previous scan in 2018, confluent foci involving periventricular deep white matter is, in addition, there is some hemosiderin deposition scattered within the white matter,  Update May 30 2019 SS: Is still on Tecfidera BID. He has not had any flares. He denies numbness or weakness in arms or legs. No falls. No problems with bowels or bladder. No change is vision. He is on disability. He sleeps okay at night, but he wakes up in the night to use the bathroom. He lives with his wife. He has 3 adopted children. Speech is stable, he still talks fast. He has been on Tecfidera since March 2019, no problems. He feels more confident in himself, before he was having "flares that he thought were strokes". He walks  for exercise.   Update December 07, 2019 SS: MRI of the brain 06/27/2019 no acute findings, no change from prior MRI 05/29/2018.  He remains on Tecfidera 240 mg twice daily, tolerating medication without side effect.  He denies any new problems or concerns, no overall change.  I talked with his wife via FaceTime, she indicates he has been doing well, she has not noticed any problems.  No falls, does have some chronic gait instability and trouble with memory. There has been an issue with insurance, has been out of his medication for a few days.   MRI of the brain 06/27/2019 MRI brain (with and without) demonstrating: -Extensive bilateral white matter T2 hyperintensities which are confluent, may be related to chronic demyelination or chronic ischemic changes. -No acute findings. No change from MRI on 05/29/18.  Update June 11, 2020: He is overall stable, tolerating vumerity without any difficulty, continue have mild gait abnormality, memory loss, is able to take care of his 3 children at home, driving without much difficulty   REVIEW OF SYSTEMS: Out of a complete 14 system review of symptoms, the patient complains only of the following symptoms, and all other reviewed systems are negative.  Gait abnormality  ALLERGIES: Allergies  Allergen Reactions  . Pork-Derived Products Other (See Comments)    DIET RESTRICTIONS    HOME MEDICATIONS: Outpatient Medications Prior to Visit  Medication Sig Dispense Refill  . ACCU-CHEK GUIDE test strip     . amLODipine (NORVASC) 10 MG tablet Take 10 mg daily by mouth.  3  . atorvastatin (LIPITOR) 40 MG tablet Take  1 tablet (40 mg total) by mouth at bedtime. 30 tablet 1  . Dimethyl Fumarate (TECFIDERA) 240 MG CPDR Take 1 capsule (240 mg total) by mouth 2 (two) times daily. 180 capsule 3  . hydrochlorothiazide (HYDRODIURIL) 25 MG tablet Take 25 mg daily by mouth.  11  . irbesartan (AVAPRO) 300 MG tablet Take 300 mg daily by mouth.  11  . LYRICA 300 MG capsule Take  300 mg 2 (two) times daily by mouth.  2  . metFORMIN (GLUCOPHAGE) 500 MG tablet Take 500 mg by mouth 2 (two) times daily with a meal.    . traMADol (ULTRAM) 50 MG tablet Take 50 mg 2 (two) times daily by mouth.  3  . clopidogrel (PLAVIX) 75 MG tablet Take 75 mg daily by mouth.  1   No facility-administered medications prior to visit.    PAST MEDICAL HISTORY: Past Medical History:  Diagnosis Date  . CVA (cerebral infarction)   . Diabetes mellitus without complication (HCC)    Type 2  . Encounter for long-term (current) use of other medications   . Headache(784.0)   . Hypertension   . MS (multiple sclerosis) (HCC)    possible  . Neuropathy   . Other and unspecified hyperlipidemia   . Stroke (HCC)    5 strokes  . Thoracic or lumbosacral neuritis or radiculitis, unspecified   . Unspecified late effects of cerebrovascular disease   . Unspecified sleep apnea    does not use cpap    PAST SURGICAL HISTORY: Past Surgical History:  Procedure Laterality Date  . ANTERIOR LAT LUMBAR FUSION N/A 08/30/2014   Procedure: ANTERIOR LATERAL LUMBAR FUSION 1 LEVEL;  Surgeon: Emilee Hero, MD;  Location: [facility] OR;  Service: Orthopedics;  Laterality: N/A;  Lumbar 3-4 lateral interbody fusion with instrumentation and allograft  . BRAIN BIOPSY      FAMILY HISTORY: Family History  Problem Relation Age of Onset  . High blood pressure Mother   . Diabetes Mother   . High blood pressure Father   . Stroke Maternal Grandmother   . Stroke Paternal Grandmother     SOCIAL HISTORY: Social History   Socioeconomic History  . Marital status: Married    Spouse name: [name]  . Number of children: 3  . Years of education: college  . Highest education level: Not on file  Occupational History    Comment: Disabled  Tobacco Use  . Smoking status: Light Tobacco Smoker    Types: Cigars  . Smokeless tobacco: Never Used  Substance and Sexual Activity  . Alcohol use: Yes    Comment: Once per month.   . Drug use: No  . Sexual activity: Not on file  Other Topics Concern  . Not on file  Social History Narrative   Patient is married and lives at home with his wife [name] .   Disabled.   Education [employer].   Right handed.   1 cup caffeine per day.   Social Determinants of Health   Financial Resource Strain:   . Difficulty of Paying Living Expenses: Not on file  Food Insecurity:   . Worried About Programme researcher, broadcasting/film/video in the Last Year: Not on file  . Ran Out of Food in the Last Year: Not on file  Transportation Needs:   . Lack of Transportation (Medical): Not on file  . Lack of Transportation (Non-Medical): Not on file  Physical Activity:   . Days of Exercise per Week: Not on file  . Minutes  of Exercise per Session: Not on file  Stress:   . Feeling of Stress : Not on file  Social Connections:   . Frequency of Communication with Friends and Family: Not on file  . Frequency of Social Gatherings with Friends and Family: Not on file  . Attends Religious Services: Not on file  . Active Member of Clubs or Organizations: Not on file  . Attends Banker Meetings: Not on file  . Marital Status: Not on file  Intimate Partner Violence:   . Fear of Current or Ex-Partner: Not on file  . Emotionally Abused: Not on file  . Physically Abused: Not on file  . Sexually Abused: Not on file   PHYSICAL EXAM  Vitals:   12/07/19 1109  BP: (!) 158/97  Pulse: 80  Temp: 97.6 F (36.4 C)  Weight: 250 lb (113.4 kg)  Height: 5\' 9"  (1.753 m)   Body mass index is 36.92 kg/m.  PHYSICAL EXAMNIATION:  Gen: NAD, conversant, well nourised, well groomed                     Cardiovascular: Regular rate rhythm, no peripheral edema, warm, nontender. Eyes: Conjunctivae clear without exudates or hemorrhage Neck: Supple, no carotid bruits. Pulmonary: Clear to auscultation bilaterally   NEUROLOGICAL EXAM:  MENTAL STATUS: Speech/Cognition: Awake, alert, normal speech, oriented to  history taking and casual conversation, mild slow reaction time  CRANIAL NERVES: CN II: Visual fields are full to confrontation.  Pupils are round equal and briskly reactive to light. CN III, IV, VI: extraocular movement are normal. No ptosis. CN V: Facial sensation is intact to light touch. CN VII: Face is symmetric with normal eye closure and smile. CN VIII: Hearing is normal to casual conversation CN IX, X: Palate elevates symmetrically. Phonation is normal. CN XI: Head turning and shoulder shrug are intact CN XII: Tongue is midline with normal movements and no atrophy.  MOTOR: Mild bilateral lower extremity spasticity, mild hip flexion weakness  REFLEXES: Reflexes are 2  and symmetric at the biceps, triceps, knees and ankles. Plantar responses are flexor.  SENSORY: Intact to light touch, pinprick, positional and vibratory sensation at fingers and toes.  COORDINATION: There is no trunk or limb ataxia.    GAIT/STANCE: He needs push-up to get up from seated position, cautious, mildly unsteady gait  DIAGNOSTIC DATA (LABS, IMAGING, TESTING) - I reviewed patient records, labs, notes, testing and imaging myself where available.  Lab Results  Component Value Date   WBC 7.5 05/30/2019   HGB 13.9 05/30/2019   HCT 42.8 05/30/2019   MCV 85 05/30/2019   PLT 249 05/30/2019      Component Value Date/Time   NA 143 05/30/2019 1021   K 4.8 05/30/2019 1021   CL 103 05/30/2019 1021   CO2 23 05/30/2019 1021   GLUCOSE 126 (H) 05/30/2019 1021   GLUCOSE 102 (H) 08/22/2014 0905   BUN 24 05/30/2019 1021   CREATININE 1.12 05/30/2019 1021   CALCIUM 9.6 05/30/2019 1021   PROT 7.0 05/30/2019 1021   ALBUMIN 4.6 05/30/2019 1021   ALBUMIN 4.3 01/06/2018 0856   AST 20 05/30/2019 1021   ALT 26 05/30/2019 1021   ALKPHOS 42 05/30/2019 1021   BILITOT 0.3 05/30/2019 1021   GFRNONAA 79 05/30/2019 1021   GFRAA 91 05/30/2019 1021   Lab Results  Component Value Date   CHOL 142 06/02/2013   HDL  34 (L) 06/02/2013   LDLCALC 92 06/02/2013   TRIG  81 06/02/2013   CHOLHDL 4.2 06/02/2013   Lab Results  Component Value Date   HGBA1C 6.2 (H) 06/01/2013   Lab Results  Component Value Date   VITAMINB12 619 10/04/2017   Lab Results  Component Value Date   TSH 1.860 05/16/2018   ASSESSMENT AND PLAN 49 year old male   Probable relapsing remitting multiple sclerosis Gait abnormality Mild cognitive impairment MRI of the brain with without contrast in August 2020, extensive bilateral white matter T2/flair hyperintensity, confluent, likely chronic demyelinating changes, no acute abnormality, no contrast-enhancement -CSF has more than 5 oligoclonal banding -JCV titer elevated 2.83, patient is not a good candidate for Tysabri -Has been on Tecfidera since March 2019, tolerating it well, no acute symptoms, will switch to generic Vumerity since June 2021, tolerating it well, Laboratory evaluations Repeat MRI of brain with without contrast, Vitamin D deficiency   Encourage him moderate exercise, vitamin D supplement   Levert Feinstein, M.D. Ph.D.  [facility] [address] [place], [place] [address] Phone: [phone] Fax:      [phone]

## 2020-06-12 ENCOUNTER — Telehealth: Payer: Self-pay | Admitting: Neurology

## 2020-06-12 LAB — COMPREHENSIVE METABOLIC PANEL
ALT: 14 IU/L (ref 0–44)
AST: 15 IU/L (ref 0–40)
Albumin/Globulin Ratio: 1.9 (ref 1.2–2.2)
Albumin: 4.5 g/dL (ref 4.0–5.0)
Alkaline Phosphatase: 46 IU/L — ABNORMAL LOW (ref 48–121)
BUN/Creatinine Ratio: 22 — ABNORMAL HIGH (ref 9–20)
BUN: 22 mg/dL (ref 6–24)
Bilirubin Total: 0.4 mg/dL (ref 0.0–1.2)
CO2: 24 mmol/L (ref 20–29)
Calcium: 8.9 mg/dL (ref 8.7–10.2)
Chloride: 105 mmol/L (ref 96–106)
Creatinine, Ser: 1.02 mg/dL (ref 0.76–1.27)
GFR calc Af Amer: 101 mL/min/{1.73_m2} (ref >59)
GFR calc non Af Amer: 88 mL/min/{1.73_m2} (ref >59)
Globulin, Total: 2.4 g/dL (ref 1.5–4.5)
Glucose: 111 mg/dL — ABNORMAL HIGH (ref 65–99)
Potassium: 4.2 mmol/L (ref 3.5–5.2)
Sodium: 141 mmol/L (ref 134–144)
Total Protein: 6.9 g/dL (ref 6.0–8.5)

## 2020-06-12 LAB — CBC WITH DIFFERENTIAL/PLATELET
Basophils Absolute: 0 10*3/uL (ref 0.0–0.2)
Basos: 1 %
EOS (ABSOLUTE): 0 10*3/uL (ref 0.0–0.4)
Eos: 1 %
Hematocrit: 42.2 % (ref 37.5–51.0)
Hemoglobin: 13.9 g/dL (ref 13.0–17.7)
Immature Grans (Abs): 0 10*3/uL (ref 0.0–0.1)
Immature Granulocytes: 0 %
Lymphocytes Absolute: 1.7 10*3/uL (ref 0.7–3.1)
Lymphs: 26 %
MCH: 27.8 pg (ref 26.6–33.0)
MCHC: 32.9 g/dL (ref 31.5–35.7)
MCV: 84 fL (ref 79–97)
Monocytes Absolute: 0.5 10*3/uL (ref 0.1–0.9)
Monocytes: 8 %
Neutrophils Absolute: 4.1 10*3/uL (ref 1.4–7.0)
Neutrophils: 64 %
Platelets: 209 10*3/uL (ref 150–450)
RBC: 5 x10E6/uL (ref 4.14–5.80)
RDW: 13.6 % (ref 11.6–15.4)
WBC: 6.3 10*3/uL (ref 3.4–10.8)

## 2020-06-12 LAB — VITAMIN B12: Vitamin B-12: 414 pg/mL (ref 232–1245)

## 2020-06-12 LAB — HGB A1C W/O EAG: Hgb A1c MFr Bld: 7 % — ABNORMAL HIGH (ref 4.8–5.6)

## 2020-06-12 LAB — VITAMIN D 25 HYDROXY (VIT D DEFICIENCY, FRACTURES): Vit D, 25-Hydroxy: 26.5 ng/mL — ABNORMAL LOW (ref 30.0–100.0)

## 2020-06-12 LAB — TSH: TSH: 2.03 u[IU]/mL (ref 0.450–4.500)

## 2020-06-12 NOTE — Telephone Encounter (Signed)
Please call his wife, laboratory evaluation showed elevated A1c of 7, consistent with his diagnosis of diabetes  Mildly low vitamin D level 26.5, he should continue vitamin D3 supplement 1000 units daily  Rest of the laboratory evaluation showed no significant abnormalities.

## 2020-06-12 NOTE — Telephone Encounter (Signed)
I spoke to the patient. He verbalized understanding of his lab results and will start taking the recommended supplement.

## 2020-06-17 ENCOUNTER — Telehealth: Payer: Self-pay

## 2020-06-17 NOTE — Telephone Encounter (Signed)
Received a PA request for vumerity from [facility]. Completed via covermymeds. Key: [id number]. Sent to [facility]. Should have a determination in 3-5 business days.

## 2020-06-18 ENCOUNTER — Telehealth: Payer: Self-pay | Admitting: Neurology

## 2020-06-18 NOTE — Telephone Encounter (Signed)
Thomas Nixon: [id number] (exp. 06/18/20 to 07/18/20) order sent to GI. They will reach out to the patient to schedule.

## 2020-06-18 NOTE — Telephone Encounter (Signed)
Received this notice from covermymeds: "PA Case: [id number], Status: Approved, Coverage Starts on: 11/24/2019 12:00:00 AM, Coverage Ends on: 11/22/2020 12:00:00 AM. Questions? Contact [phone]."

## 2020-07-13 ENCOUNTER — Other Ambulatory Visit: Payer: Medicare PPO

## 2020-08-03 ENCOUNTER — Ambulatory Visit
Admission: RE | Admit: 2020-08-03 | Discharge: 2020-08-03 | Disposition: A | Discharge status: Home / Self Care | Payer: Medicare PPO | Source: Ambulatory Visit | Attending: Neurology | Admitting: Neurology

## 2020-08-03 DIAGNOSIS — R269 Unspecified abnormalities of gait and mobility: Secondary | ICD-10-CM

## 2020-08-03 DIAGNOSIS — G35 Multiple sclerosis: Secondary | ICD-10-CM | POA: Diagnosis not present

## 2020-08-03 MED ORDER — GADOBENATE DIMEGLUMINE 529 MG/ML IV SOLN
20.0000 mL | Freq: Once | INTRAVENOUS | Status: AC | PRN
  Administered 2020-08-03: 20 mL via INTRAVENOUS

## 2020-08-05 ENCOUNTER — Telehealth: Payer: Self-pay | Admitting: Neurology

## 2020-08-05 NOTE — Telephone Encounter (Signed)
I spoke to the patient and he verbalized understanding of his results. 

## 2020-08-05 NOTE — Telephone Encounter (Addendum)
IMPRESSION: Abnormal MRI scan of the brain with and without contrast showing confluent periventricular and subcortical white matter hyperintensities compatible with chronic demyelinating disease.  No acute enhancing lesions are noted.  The presence of T1 black holes and mild atrophy of corpus callosum and cortex indicates chronic disease.  Overall no significant change compared with previous MRI dated 06/27/2019  Please call patient, MRI of the brain with without contrast showed NO significant change compared to August 2020

## 2020-08-07 DIAGNOSIS — Z23 Encounter for immunization: Secondary | ICD-10-CM | POA: Diagnosis not present

## 2020-08-07 DIAGNOSIS — M79674 Pain in right toe(s): Secondary | ICD-10-CM | POA: Diagnosis not present

## 2020-09-05 DIAGNOSIS — E1140 Type 2 diabetes mellitus with diabetic neuropathy, unspecified: Secondary | ICD-10-CM | POA: Diagnosis not present

## 2020-09-05 DIAGNOSIS — Z8673 Personal history of transient ischemic attack (TIA), and cerebral infarction without residual deficits: Secondary | ICD-10-CM | POA: Diagnosis not present

## 2020-09-05 DIAGNOSIS — R269 Unspecified abnormalities of gait and mobility: Secondary | ICD-10-CM | POA: Diagnosis not present

## 2020-09-05 DIAGNOSIS — E7800 Pure hypercholesterolemia, unspecified: Secondary | ICD-10-CM | POA: Diagnosis not present

## 2020-09-05 DIAGNOSIS — M48061 Spinal stenosis, lumbar region without neurogenic claudication: Secondary | ICD-10-CM | POA: Diagnosis not present

## 2020-09-05 DIAGNOSIS — I10 Essential (primary) hypertension: Secondary | ICD-10-CM | POA: Diagnosis not present

## 2020-09-05 DIAGNOSIS — E119 Type 2 diabetes mellitus without complications: Secondary | ICD-10-CM | POA: Diagnosis not present

## 2020-09-05 DIAGNOSIS — G35 Multiple sclerosis: Secondary | ICD-10-CM | POA: Diagnosis not present

## 2020-10-28 DIAGNOSIS — R0609 Other forms of dyspnea: Secondary | ICD-10-CM | POA: Diagnosis not present

## 2020-11-11 ENCOUNTER — Other Ambulatory Visit: Payer: Self-pay

## 2020-11-11 ENCOUNTER — Ambulatory Visit: Payer: Medicare PPO | Admitting: Cardiology

## 2020-11-11 ENCOUNTER — Encounter: Payer: Self-pay | Admitting: Cardiology

## 2020-11-11 VITALS — BP 132/80 | HR 99 | Ht 69.0 in | Wt 237.0 lb

## 2020-11-11 DIAGNOSIS — I10 Essential (primary) hypertension: Secondary | ICD-10-CM

## 2020-11-11 DIAGNOSIS — I4891 Unspecified atrial fibrillation: Secondary | ICD-10-CM | POA: Diagnosis not present

## 2020-11-11 MED ORDER — RIVAROXABAN 20 MG PO TABS: 20.0000 mg | ORAL_TABLET | Freq: Every day | ORAL | 6 refills | Status: DC

## 2020-11-11 MED ORDER — METOPROLOL SUCCINATE ER 50 MG PO TB24: 50.0000 mg | ORAL_TABLET | Freq: Every day | ORAL | 3 refills | Status: DC

## 2020-11-11 NOTE — Progress Notes (Signed)
Cardiology Office Note:    Date:  11/11/2020   ID:  Thomas Nixon, DOB [birth year], MRN [id number]  PCP:  Georgann Housekeeper, MD  [facility] Cardiologist:  Donato Schultz, MD  [facility] Electrophysiologist:  None   Referring MD: Georgann Housekeeper, MD     History of Present Illness:    Thomas Nixon is a 49 y.o. male here for the evaluation of dyspnea on exertion at the request of Dr. Donette Larry.  In review of prior outpatient office note has dyspnea on exertion with activity such as pushing moving things at work for instance, short of breath.  Does not have any orthopnea.  This is been going on for the past month or 2.  No edema.  No cough fever.  Family history-coronary artery disease on his mother side. -Comorbidities include morbid obesity, diabetes, multiple sclerosis, prior MRI with intercerebral hemorrhages/periventricular white matter changes.  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 Chronic demyelinating changes noted on MRI.  Prior echo in 2014-EF 50 to 55%.  DOE is better now. Was not taking pills at the time.   HTN DM    Past Medical History:  Diagnosis Date  . CVA (cerebral infarction)   . Diabetes mellitus without complication (HCC)    Type 2  . Encounter for long-term (current) use of other medications   . Headache(784.0)   . Hypertension   . MS (multiple sclerosis) (HCC)    possible  . Neuropathy   . Other and unspecified hyperlipidemia   . Stroke (HCC)    5 strokes  . Thoracic or lumbosacral neuritis or radiculitis, unspecified   . Unspecified late effects of cerebrovascular disease   . Unspecified sleep apnea    does not use cpap    Past Surgical History:  Procedure Laterality Date  . ANTERIOR LAT LUMBAR FUSION N/A 08/30/2014   Procedure: ANTERIOR LATERAL LUMBAR FUSION 1 LEVEL;  Surgeon: Emilee Hero, MD;  Location: [facility] OR;  Service: Orthopedics;   Laterality: N/A;  Lumbar 3-4 lateral interbody fusion with instrumentation and allograft  . BRAIN BIOPSY      Current Medications: Current Meds  Medication Sig  . ACCU-CHEK GUIDE test strip   . amLODipine (NORVASC) 10 MG tablet Take 10 mg daily by mouth.  Marland Kitchen atorvastatin (LIPITOR) 40 MG tablet Take 1 tablet (40 mg total) by mouth at bedtime.  . Cholecalciferol (VITAMIN D3 PO) Take 1,000 Units by mouth daily.  . hydrochlorothiazide (HYDRODIURIL) 25 MG tablet Take 25 mg daily by mouth.  . irbesartan (AVAPRO) 300 MG tablet Take 300 mg daily by mouth.  . losartan (COZAAR) 100 MG tablet Take 100 mg by mouth daily.  Marland Kitchen LYRICA 300 MG capsule Take 300 mg 2 (two) times daily by mouth.  . metFORMIN (GLUCOPHAGE) 1000 MG tablet Take 1,000 mg by mouth 2 (two) times daily with a meal.  . traMADol (ULTRAM) 50 MG tablet Take 50 mg 2 (two) times daily by mouth.  . VUMERITY 231 MG CPDR 432mg  po bid maintenance.  . [DISCONTINUED] clopidogrel (PLAVIX) 75 MG tablet Take 75 mg daily by mouth.     Allergies:   Pork-derived products   Social History   Socioeconomic History  . Marital status: Married    Spouse name:  . Number of children: 3  . Years of education: college  . Highest education level: Not on file  Occupational History    Comment: Disabled  Tobacco Use  . Smoking status: Light Tobacco Smoker    Types: Cigars  . Smokeless tobacco: Never Used  Vaping Use  . Vaping Use: Never used  Substance and Sexual Activity  . Alcohol use: Yes    Comment: Once per month.  . Drug use: No  . Sexual activity: Not on file  Other Topics Concern  . Not on file  Social History Narrative   Patient is married and lives at home with his wife [name] .   Disabled.   Education [employer].   Right handed.   1 cup caffeine per day.   Social Determinants of Health   Financial Resource Strain: Not on file  Food Insecurity: Not on file  Transportation Needs: Not on file  Physical Activity: Not on  file  Stress: Not on file  Social Connections: Not on file     Family History: The patient's family history includes Diabetes in his mother; High blood pressure in his father and mother; Stroke in his maternal grandmother and paternal grandmother.  ROS:   Please see the history of present illness.    Denies any fevers chills nausea vomiting syncope bleeding all other systems reviewed and are negative.  EKGs/Labs/Other Studies Reviewed:    EKG:  EKG is ordered today.  The ekg ordered today demonstrates atrial fibrillation heart rate 99 bpm newly discovered.  Recent Labs: 06/11/2020: ALT 14; BUN 22; Creatinine, Ser 1.02; Hemoglobin 13.9; Platelets 209; Potassium 4.2; Sodium 141; TSH 2.030  Recent Lipid Panel    Component Value Date/Time   CHOL 142 06/02/2013 0640   TRIG 81 06/02/2013 0640   HDL 34 (L) 06/02/2013 0640   CHOLHDL 4.2 06/02/2013 0640   VLDL 16 06/02/2013 0640   LDLCALC 92 06/02/2013 0640     Risk Assessment/Calculations:     CHA2DS2-VASc Score = 2  This indicates a 2.2% annual risk of stroke. The patient's score is based upon: CHF History: No HTN History: Yes Diabetes History: Yes Stroke History: No Vascular Disease History: No Age Score: 0 Gender Score: 0      Physical Exam:    VS:  BP 132/80 (BP Location: Left Arm, Patient Position: Sitting, Cuff Size: Normal)   Pulse 99   Ht  (1.753 m)   Wt 237 lb (107.5 kg)   SpO2 95%   BMI 35.00 kg/m     Wt Readings from Last 3 Encounters:  11/11/20 237 lb (107.5 kg)  06/11/20 238 lb (108 kg)  12/07/19 250 lb (113.4 kg)     GEN:  Well nourished, well developed in no acute distress HEENT: Normal NECK: No JVD; No carotid bruits LYMPHATICS: No lymphadenopathy CARDIAC: irreg, no murmurs, rubs, gallops RESPIRATORY:  Clear to auscultation without rales, wheezing or rhonchi  ABDOMEN: Soft, non-tender, non-distended MUSCULOSKELETAL:  No edema; No deformity  SKIN: Warm and dry NEUROLOGIC:  Alert and  oriented x 3 PSYCHIATRIC:  Normal affect   ASSESSMENT:    1. Primary hypertension   2. Atrial fibrillation, unspecified type (HCC)    PLAN:    In order of problems listed above:  Newly discovered atrial fibrillation -Starting Toprol-XL 50 mg once a day, heart rate currently 99 bpm -Starting Xarelto 20 mg once a day (diabetes, hypertension)-CHA2DS2-VASc 2-explained stroke risk. -Stopping Plavix 75 mg a day -Checking echocardiogram to ensure proper structure and function -TSH 2.03, ALT 14, creatinine 1.02, potassium 4.2, hemoglobin 13.9 up on recent lab work outside.  LDL 83. -We will see him back in 2  weeks.  If he is still in atrial fibrillation, we will go ahead and plan for cardioversion.  He needs to be on Xarelto consistently for 3 weeks prior.  This way we do not need to use a TEE.  Explained to him the procedure.  Risks and benefits have been explained.  He is willing to proceed.  Multiple sclerosis -according to prior notes, likely did not have a stroke in the past, this was likely secondary to multiple sclerosis episode.  Currently on Vumerity followed by neurology.  Essential hypertension -Multidrug regimen as above.  No changes made.  Dyspnea on exertion -Checking echocardiogram.  This could have been related to atrial fibrillation potentially.  He does state that he feels better now that he is taking his medications again.  He admits to noncompliance in the past.  Expressed the importance of him to take his Xarelto consistently.  This will allow for potential cardioversion   Shared Decision Making/Informed Consent The risks (stroke, cardiac arrhythmias rarely resulting in the need for a temporary or permanent pacemaker, skin irritation or burns and complications associated with conscious sedation including aspiration, arrhythmia, respiratory failure and death), benefits (restoration of normal sinus rhythm) and alternatives of a direct current cardioversion were explained in  detail to Mr. Glaspy and he agrees to proceed.          Medication Adjustments/Labs and Tests Ordered: Current medicines are reviewed at length with the patient today.  Concerns regarding medicines are outlined above.  Orders Placed This Encounter  Procedures  . EKG 12-Lead  . ECHOCARDIOGRAM COMPLETE   Meds ordered this encounter  Medications  . rivaroxaban (XARELTO) 20 MG TABS tablet    Sig: Take 1 tablet (20 mg total) by mouth daily with supper.    Dispense:  30 tablet    Refill:  6  . metoprolol succinate (TOPROL-XL) 50 MG 24 hr tablet    Sig: Take 1 tablet (50 mg total) by mouth daily. Take with or immediately following a meal.    Dispense:  90 tablet    Refill:  3    Patient Instructions  Medication Instructions:  Please discontinue your Plavix (Clopidigrel) Start Xarelto 20 mg once daily. Start Metoprolol 50 mg daily. Continue all other medications as listed.  *If you need a refill on your cardiac medications before your next appointment, please call your pharmacy*  Testing/Procedures: Your physician has requested that you have an echocardiogram. Echocardiography is a painless test that uses sound waves to create images of your heart. It provides your doctor with information about the size and shape of your heart and how well your heart's chambers and valves are working. This procedure takes approximately one hour. There are no restrictions for this procedure.  Follow-Up: At [facility], you and your health needs are our priority.  As part of our continuing mission to provide you with exceptional heart care, we have created designated Provider Care Teams.  These Care Teams include your primary Cardiologist (physician) and Advanced Practice Providers (APPs -  Physician Assistants and Nurse Practitioners) who all work together to provide you with the care you need, when you need it.  We recommend signing up for the patient portal called "MyChart".  Sign up information  is provided on this After Visit Summary.  MyChart is used to connect with patients for Virtual Visits (Telemedicine).  Patients are able to view lab/test results, encounter notes, upcoming appointments, etc.  Non-urgent messages can be sent to your provider as well.  To learn more about what you can do with MyChart, go to [email or web address].    Your next appointment:   2 week(s)  The format for your next appointment:   In Person  Provider:   Donato Schultz, MD   Thank you for choosing [facility]!!      Atrial Fibrillation  Atrial fibrillation is a type of heartbeat that is irregular or fast. If you have this condition, your heart beats without any order. This makes it hard for your heart to pump blood in a normal way. Atrial fibrillation may come and go, or it may become a long-lasting problem. If this condition is not treated, it can put you at higher risk for stroke, heart failure, and other heart problems. What are the causes? This condition may be caused by diseases that damage the heart. They include:  High blood pressure.  Heart failure.  Heart valve disease.  Heart surgery. Other causes include:  Diabetes.  Thyroid disease.  Being overweight.  Kidney disease. Sometimes the cause is not known. What increases the risk? You are more likely to develop this condition if:  You are older.  You smoke.  You exercise often and very hard.  You have a family history of this condition.  You are a man.  You use drugs.  You drink a lot of alcohol.  You have lung conditions, such as emphysema, pneumonia, or COPD.  You have sleep apnea. What are the signs or symptoms? Common symptoms of this condition include:  A feeling that your heart is beating very fast.  Chest pain or discomfort.  Feeling short of breath.  Suddenly feeling light-headed or weak.  Getting tired easily during activity.  Fainting.  Sweating. In some cases, there are  no symptoms. How is this treated? Treatment for this condition depends on underlying conditions and how you feel when you have atrial fibrillation. They include:  Medicines to: ? Prevent blood clots. ? Treat heart rate or heart rhythm problems.  Using devices, such as a pacemaker, to correct heart rhythm problems.  Doing surgery to remove the part of the heart that sends bad signals.  Closing an area where clots can form in the heart (left atrial appendage). In some cases, your doctor will treat other underlying conditions. Follow these instructions at home: Medicines  Take over-the-counter and prescription medicines only as told by your doctor.  Do not take any new medicines without first talking to your doctor.  If you are taking blood thinners: ? Talk with your doctor before you take any medicines that have aspirin or NSAIDs, such as ibuprofen, in them. ? Take your medicine exactly as told by your doctor. Take it at the same time each day. ? Avoid activities that could hurt or bruise you. Follow instructions about how to prevent falls. ? Wear a bracelet that says you are taking blood thinners. Or, carry a card that lists what medicines you take. Lifestyle   Do not use any products that have nicotine or tobacco in them. These include cigarettes, e-cigarettes, and chewing tobacco. If you need help quitting, ask your doctor.  Eat heart-healthy foods. Talk with your doctor about the right eating plan for you.  Exercise regularly as told by your doctor.  Do not drink alcohol.  Lose weight if you are overweight.  Do not use drugs, including cannabis. General instructions  If you have a condition that causes breathing to stop for a short period of time (apnea), treat it as  told by your doctor.  Keep a healthy weight. Do not use diet pills unless your doctor says they are safe for you. Diet pills may make heart problems worse.  Keep all follow-up visits as told by your  doctor. This is important. Contact a doctor if:  You notice a change in the speed, rhythm, or strength of your heartbeat.  You are taking a blood-thinning medicine and you get more bruising.  You get tired more easily when you move or exercise.  You have a sudden change in weight. Get help right away if:   You have pain in your chest or your belly (abdomen).  You have trouble breathing.  You have side effects of blood thinners, such as blood in your vomit, poop (stool), or pee (urine), or bleeding that cannot stop.  You have any signs of a stroke. "BE FAST" is an easy way to remember the main warning signs: ? B - Balance. Signs are dizziness, sudden trouble walking, or loss of balance. ? E - Eyes. Signs are trouble seeing or a change in how you see. ? F - Face. Signs are sudden weakness or loss of feeling in the face, or the face or eyelid drooping on one side. ? A - Arms. Signs are weakness or loss of feeling in an arm. This happens suddenly and usually on one side of the body. ? S - Speech. Signs are sudden trouble speaking, slurred speech, or trouble understanding what people say. ? T - Time. Time to call emergency services. Write down what time symptoms started.  You have other signs of a stroke, such as: ? A sudden, very bad headache with no known cause. ? Feeling like you may vomit (nausea). ? Vomiting. ? A seizure. These symptoms may be an emergency. Do not wait to see if the symptoms will go away. Get medical help right away. Call your local emergency services (911 in the [place] Do not drive yourself to the hospital. Summary  Atrial fibrillation is a type of heartbeat that is irregular or fast.  You are at higher risk of this condition if you smoke, are older, have diabetes, or are overweight.  Follow your doctor's instructions about medicines, diet, exercise, and follow-up visits.  Get help right away if you have signs or symptoms of a stroke.  Get help right away  if you cannot catch your breath, or you have chest pain or discomfort. This information is not intended to replace advice given to you by your health care provider. Make sure you discuss any questions you have with your health care provider. Document Revised: 05/03/2019 Document Reviewed: 05/03/2019 Elsevier Patient Education  2020 [employer].   Writer cardioversion is the delivery of a jolt of electricity to restore a normal rhythm to the heart. A rhythm that is too fast or is not regular keeps the heart from pumping well. In this procedure, sticky patches or metal paddles are placed on the chest to deliver electricity to the heart from a device. This procedure may be done in an emergency if:  There is low or no blood pressure as a result of the heart rhythm.  Normal rhythm must be restored as fast as possible to protect the brain and heart from further damage.  It may save a life. This may also be a scheduled procedure for irregular or fast heart rhythms that are not immediately life-threatening. Tell a health care provider about:  Any allergies you have.  All medicines  you are taking, including vitamins, herbs, eye drops, creams, and over-the-counter medicines.  Any problems you or family members have had with anesthetic medicines.  Any blood disorders you have.  Any surgeries you have had.  Any medical conditions you have.  Whether you are pregnant or may be pregnant. What are the risks? Generally, this is a safe procedure. However, problems may occur, including:  Allergic reactions to medicines.  A blood clot that breaks free and travels to other parts of your body.  The possible return of an abnormal heart rhythm within hours or days after the procedure.  Your heart stopping (cardiac arrest). This is rare. What happens before the procedure? Medicines  Your health care provider may have you start taking: ? Blood-thinning medicines  (anticoagulants) so your blood does not clot as easily. ? Medicines to help stabilize your heart rate and rhythm.  Ask your health care provider about: ? Changing or stopping your regular medicines. This is especially important if you are taking diabetes medicines or blood thinners. ? Taking medicines such as aspirin and ibuprofen. These medicines can thin your blood. Do not take these medicines unless your health care provider tells you to take them. ? Taking over-the-counter medicines, vitamins, herbs, and supplements. General instructions  Follow instructions from your health care provider about eating or drinking restrictions.  Plan to have someone take you home from the hospital or clinic.  If you will be going home right after the procedure, plan to have someone with you for 24 hours.  Ask your health care provider what steps will be taken to help prevent infection. These may include washing your skin with a germ-killing soap. What happens during the procedure?   An IV will be inserted into one of your veins.  Sticky patches (electrodes) or metal paddles may be placed on your chest.  You will be given a medicine to help you relax (sedative).  An electrical shock will be delivered. The procedure may vary among health care providers and hospitals. What can I expect after the procedure?  Your blood pressure, heart rate, breathing rate, and blood oxygen level will be monitored until you leave the hospital or clinic.  Your heart rhythm will be watched to make sure it does not change.  You may have some redness on the skin where the shocks were given. Follow these instructions at home:  Do not drive for 24 hours if you were given a sedative during your procedure.  Take over-the-counter and prescription medicines only as told by your health care provider.  Ask your health care provider how to check your pulse. Check it often.  Rest for 48 hours after the procedure or as told  by your health care provider.  Avoid or limit your caffeine use as told by your health care provider.  Keep all follow-up visits as told by your health care provider. This is important. Contact a health care provider if:  You feel like your heart is beating too quickly or your pulse is not regular.  You have a serious muscle cramp that does not go away. Get help right away if:  You have discomfort in your chest.  You are dizzy or you feel faint.  You have trouble breathing or you are short of breath.  Your speech is slurred.  You have trouble moving an arm or leg on one side of your body.  Your fingers or toes turn cold or blue. Summary  Electrical cardioversion is the delivery of  a jolt of electricity to restore a normal rhythm to the heart.  This procedure may be done right away in an emergency or may be a scheduled procedure if the condition is not an emergency.  Generally, this is a safe procedure.  After the procedure, check your pulse often as told by your health care provider. This information is not intended to replace advice given to you by your health care provider. Make sure you discuss any questions you have with your health care provider. Document Revised: 06/12/2019 Document Reviewed: 06/12/2019 Elsevier Patient Education  2020 [employer].     Signed, Donato Schultz, MD  11/11/2020 11:11 AM    [facility]

## 2020-11-11 NOTE — Patient Instructions (Addendum)
Medication Instructions:  Please discontinue your Plavix (Clopidigrel) Start Xarelto 20 mg once daily. Start Metoprolol 50 mg daily. Continue all other medications as listed.  *If you need a refill on your cardiac medications before your next appointment, please call your pharmacy*  Testing/Procedures: Your physician has requested that you have an echocardiogram. Echocardiography is a painless test that uses sound waves to create images of your heart. It provides your doctor with information about the size and shape of your heart and how well your heart's chambers and valves are working. This procedure takes approximately one hour. There are no restrictions for this procedure.  Follow-Up: At [facility], you and your health needs are our priority.  As part of our continuing mission to provide you with exceptional heart care, we have created designated Provider Care Teams.  These Care Teams include your primary Cardiologist (physician) and Advanced Practice Providers (APPs -  Physician Assistants and Nurse Practitioners) who all work together to provide you with the care you need, when you need it.  We recommend signing up for the patient portal called "MyChart".  Sign up information is provided on this After Visit Summary.  MyChart is used to connect with patients for Virtual Visits (Telemedicine).  Patients are able to view lab/test results, encounter notes, upcoming appointments, etc.  Non-urgent messages can be sent to your provider as well.   To learn more about what you can do with MyChart, go to [email or web address].    Your next appointment:   2 week(s)  The format for your next appointment:   In Person  Provider:   Donato Schultz, MD   Thank you for choosing [facility]!!      Atrial Fibrillation  Atrial fibrillation is a type of heartbeat that is irregular or fast. If you have this condition, your heart beats without any order. This makes it hard for your  heart to pump blood in a normal way. Atrial fibrillation may come and go, or it may become a long-lasting problem. If this condition is not treated, it can put you at higher risk for stroke, heart failure, and other heart problems. What are the causes? This condition may be caused by diseases that damage the heart. They include:  High blood pressure.  Heart failure.  Heart valve disease.  Heart surgery. Other causes include:  Diabetes.  Thyroid disease.  Being overweight.  Kidney disease. Sometimes the cause is not known. What increases the risk? You are more likely to develop this condition if:  You are older.  You smoke.  You exercise often and very hard.  You have a family history of this condition.  You are a man.  You use drugs.  You drink a lot of alcohol.  You have lung conditions, such as emphysema, pneumonia, or COPD.  You have sleep apnea. What are the signs or symptoms? Common symptoms of this condition include:  A feeling that your heart is beating very fast.  Chest pain or discomfort.  Feeling short of breath.  Suddenly feeling light-headed or weak.  Getting tired easily during activity.  Fainting.  Sweating. In some cases, there are no symptoms. How is this treated? Treatment for this condition depends on underlying conditions and how you feel when you have atrial fibrillation. They include:  Medicines to: ? Prevent blood clots. ? Treat heart rate or heart rhythm problems.  Using devices, such as a pacemaker, to correct heart rhythm problems.  Doing surgery to remove the part of the  heart that sends bad signals.  Closing an area where clots can form in the heart (left atrial appendage). In some cases, your doctor will treat other underlying conditions. Follow these instructions at home: Medicines  Take over-the-counter and prescription medicines only as told by your doctor.  Do not take any new medicines without first talking  to your doctor.  If you are taking blood thinners: ? Talk with your doctor before you take any medicines that have aspirin or NSAIDs, such as ibuprofen, in them. ? Take your medicine exactly as told by your doctor. Take it at the same time each day. ? Avoid activities that could hurt or bruise you. Follow instructions about how to prevent falls. ? Wear a bracelet that says you are taking blood thinners. Or, carry a card that lists what medicines you take. Lifestyle   Do not use any products that have nicotine or tobacco in them. These include cigarettes, e-cigarettes, and chewing tobacco. If you need help quitting, ask your doctor.  Eat heart-healthy foods. Talk with your doctor about the right eating plan for you.  Exercise regularly as told by your doctor.  Do not drink alcohol.  Lose weight if you are overweight.  Do not use drugs, including cannabis. General instructions  If you have a condition that causes breathing to stop for a short period of time (apnea), treat it as told by your doctor.  Keep a healthy weight. Do not use diet pills unless your doctor says they are safe for you. Diet pills may make heart problems worse.  Keep all follow-up visits as told by your doctor. This is important. Contact a doctor if:  You notice a change in the speed, rhythm, or strength of your heartbeat.  You are taking a blood-thinning medicine and you get more bruising.  You get tired more easily when you move or exercise.  You have a sudden change in weight. Get help right away if:   You have pain in your chest or your belly (abdomen).  You have trouble breathing.  You have side effects of blood thinners, such as blood in your vomit, poop (stool), or pee (urine), or bleeding that cannot stop.  You have any signs of a stroke. "BE FAST" is an easy way to remember the main warning signs: ? B - Balance. Signs are dizziness, sudden trouble walking, or loss of balance. ? E - Eyes.  Signs are trouble seeing or a change in how you see. ? F - Face. Signs are sudden weakness or loss of feeling in the face, or the face or eyelid drooping on one side. ? A - Arms. Signs are weakness or loss of feeling in an arm. This happens suddenly and usually on one side of the body. ? S - Speech. Signs are sudden trouble speaking, slurred speech, or trouble understanding what people say. ? T - Time. Time to call emergency services. Write down what time symptoms started.  You have other signs of a stroke, such as: ? A sudden, very bad headache with no known cause. ? Feeling like you may vomit (nausea). ? Vomiting. ? A seizure. These symptoms may be an emergency. Do not wait to see if the symptoms will go away. Get medical help right away. Call your local emergency services (911 in the [place] Do not drive yourself to the hospital. Summary  Atrial fibrillation is a type of heartbeat that is irregular or fast.  You are at higher risk of this condition  if you smoke, are older, have diabetes, or are overweight.  Follow your doctor's instructions about medicines, diet, exercise, and follow-up visits.  Get help right away if you have signs or symptoms of a stroke.  Get help right away if you cannot catch your breath, or you have chest pain or discomfort. This information is not intended to replace advice given to you by your health care provider. Make sure you discuss any questions you have with your health care provider. Document Revised: 05/03/2019 Document Reviewed: 05/03/2019 Elsevier Patient Education  2020 [employer].   Writer cardioversion is the delivery of a jolt of electricity to restore a normal rhythm to the heart. A rhythm that is too fast or is not regular keeps the heart from pumping well. In this procedure, sticky patches or metal paddles are placed on the chest to deliver electricity to the heart from a device. This procedure may be done in an  emergency if:  There is low or no blood pressure as a result of the heart rhythm.  Normal rhythm must be restored as fast as possible to protect the brain and heart from further damage.  It may save a life. This may also be a scheduled procedure for irregular or fast heart rhythms that are not immediately life-threatening. Tell a health care provider about:  Any allergies you have.  All medicines you are taking, including vitamins, herbs, eye drops, creams, and over-the-counter medicines.  Any problems you or family members have had with anesthetic medicines.  Any blood disorders you have.  Any surgeries you have had.  Any medical conditions you have.  Whether you are pregnant or may be pregnant. What are the risks? Generally, this is a safe procedure. However, problems may occur, including:  Allergic reactions to medicines.  A blood clot that breaks free and travels to other parts of your body.  The possible return of an abnormal heart rhythm within hours or days after the procedure.  Your heart stopping (cardiac arrest). This is rare. What happens before the procedure? Medicines  Your health care provider may have you start taking: ? Blood-thinning medicines (anticoagulants) so your blood does not clot as easily. ? Medicines to help stabilize your heart rate and rhythm.  Ask your health care provider about: ? Changing or stopping your regular medicines. This is especially important if you are taking diabetes medicines or blood thinners. ? Taking medicines such as aspirin and ibuprofen. These medicines can thin your blood. Do not take these medicines unless your health care provider tells you to take them. ? Taking over-the-counter medicines, vitamins, herbs, and supplements. General instructions  Follow instructions from your health care provider about eating or drinking restrictions.  Plan to have someone take you home from the hospital or clinic.  If you will be  going home right after the procedure, plan to have someone with you for 24 hours.  Ask your health care provider what steps will be taken to help prevent infection. These may include washing your skin with a germ-killing soap. What happens during the procedure?   An IV will be inserted into one of your veins.  Sticky patches (electrodes) or metal paddles may be placed on your chest.  You will be given a medicine to help you relax (sedative).  An electrical shock will be delivered. The procedure may vary among health care providers and hospitals. What can I expect after the procedure?  Your blood pressure, heart rate, breathing rate, and  blood oxygen level will be monitored until you leave the hospital or clinic.  Your heart rhythm will be watched to make sure it does not change.  You may have some redness on the skin where the shocks were given. Follow these instructions at home:  Do not drive for 24 hours if you were given a sedative during your procedure.  Take over-the-counter and prescription medicines only as told by your health care provider.  Ask your health care provider how to check your pulse. Check it often.  Rest for 48 hours after the procedure or as told by your health care provider.  Avoid or limit your caffeine use as told by your health care provider.  Keep all follow-up visits as told by your health care provider. This is important. Contact a health care provider if:  You feel like your heart is beating too quickly or your pulse is not regular.  You have a serious muscle cramp that does not go away. Get help right away if:  You have discomfort in your chest.  You are dizzy or you feel faint.  You have trouble breathing or you are short of breath.  Your speech is slurred.  You have trouble moving an arm or leg on one side of your body.  Your fingers or toes turn cold or blue. Summary  Electrical cardioversion is the delivery of a jolt of  electricity to restore a normal rhythm to the heart.  This procedure may be done right away in an emergency or may be a scheduled procedure if the condition is not an emergency.  Generally, this is a safe procedure.  After the procedure, check your pulse often as told by your health care provider. This information is not intended to replace advice given to you by your health care provider. Make sure you discuss any questions you have with your health care provider. Document Revised: 06/12/2019 Document Reviewed: 06/12/2019 Elsevier Patient Education  2020 [employer].

## 2020-11-26 ENCOUNTER — Telehealth: Payer: Self-pay

## 2020-11-26 NOTE — Telephone Encounter (Signed)
Received PA vumerity. Completed via CMM. Key: Rollen Sox to [employer]. Should have a determination in 1-3 business days.

## 2020-11-26 NOTE — Telephone Encounter (Signed)
Received PA vumerity. Completed via CMM. Key: Rollen Sox to [employer]. Should have a determination in 1-3 business days.  [employer] approved vumerity.PA Case: [id number], Status: Approved, Coverage Starts on: 11/23/2020 12:00:00 AM, Coverage Ends on: 11/22/2021 12:00:00 AM. Questions? Contact [phone].

## 2020-11-28 ENCOUNTER — Ambulatory Visit: Payer: Medicare PPO | Admitting: Cardiology

## 2020-11-29 ENCOUNTER — Ambulatory Visit: Payer: Medicare PPO | Admitting: Cardiology

## 2020-11-29 ENCOUNTER — Other Ambulatory Visit: Payer: Self-pay

## 2020-11-29 ENCOUNTER — Encounter: Payer: Self-pay | Admitting: Cardiology

## 2020-11-29 VITALS — BP 138/90 | HR 70 | Ht 70.0 in | Wt 240.2 lb

## 2020-11-29 DIAGNOSIS — I10 Essential (primary) hypertension: Secondary | ICD-10-CM | POA: Diagnosis not present

## 2020-11-29 DIAGNOSIS — I480 Paroxysmal atrial fibrillation: Secondary | ICD-10-CM

## 2020-11-29 NOTE — Progress Notes (Signed)
Cardiology Office Note:    Date:  11/29/2020   ID:  Thomas Nixon, DOB 11-Mar-1971, MRN [id number]  PCP:  Georgann Housekeeper, MD  [facility] Cardiologist:  Donato Schultz, MD  [facility] Electrophysiologist:  None   Referring MD: Georgann Housekeeper, MD     History of Present Illness:    Thomas Nixon is a 50 y.o. male here for the follow-up of dyspnea on exertion, newly discovered atrial fibrillation.  He was seen last on 11/11/2020.  Shortness of breath when pushing or moving things for instance.  No edema.   Family history-coronary artery disease on his mother side. -Comorbidities include morbid obesity, diabetes, multiple sclerosis, prior MRI with intercerebral hemorrhages/periventricular white matter changes.  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 Chronic demyelinating changes noted on MRI.  Prior echo in 2014-EF 50 to 55%.  Past Medical History:  Diagnosis Date  . CVA (cerebral infarction)   . Diabetes mellitus without complication (HCC)    Type 2  . Encounter for long-term (current) use of other medications   . Headache(784.0)   . Hypertension   . MS (multiple sclerosis) (HCC)    possible  . Neuropathy   . Other and unspecified hyperlipidemia   . Stroke (HCC)    5 strokes  . Thoracic or lumbosacral neuritis or radiculitis, unspecified   . Unspecified late effects of cerebrovascular disease   . Unspecified sleep apnea    does not use cpap    Past Surgical History:  Procedure Laterality Date  . ANTERIOR LAT LUMBAR FUSION N/A 08/30/2014   Procedure: ANTERIOR LATERAL LUMBAR FUSION 1 LEVEL;  Surgeon: Emilee Hero, MD;  Location: [facility] OR;  Service: Orthopedics;  Laterality: N/A;  Lumbar 3-4 lateral interbody fusion with instrumentation and allograft  . BRAIN BIOPSY      Current Medications: Current Meds  Medication Sig  . ACCU-CHEK GUIDE test strip   .  amLODipine (NORVASC) 10 MG tablet Take 10 mg daily by mouth.  Marland Kitchen atorvastatin (LIPITOR) 40 MG tablet Take 1 tablet (40 mg total) by mouth at bedtime.  . Cholecalciferol (VITAMIN D3 PO) Take 1,000 Units by mouth daily.  . hydrochlorothiazide (HYDRODIURIL) 25 MG tablet Take 25 mg daily by mouth.  . irbesartan (AVAPRO) 300 MG tablet Take 300 mg daily by mouth.  . losartan (COZAAR) 100 MG tablet Take 100 mg by mouth daily.  Marland Kitchen LYRICA 300 MG capsule Take 300 mg 2 (two) times daily by mouth.  . metFORMIN (GLUCOPHAGE) 1000 MG tablet Take 1,000 mg by mouth 2 (two) times daily with a meal.  . metoprolol succinate (TOPROL-XL) 50 MG 24 hr tablet Take 1 tablet (50 mg total) by mouth daily. Take with or immediately following a meal.  . rivaroxaban (XARELTO) 20 MG TABS tablet Take 1 tablet (20 mg total) by mouth daily with supper.  . traMADol (ULTRAM) 50 MG tablet Take 50 mg 2 (two) times daily by mouth.  . VUMERITY 231 MG CPDR 432mg  po bid maintenance.     Allergies:   Pork-derived products   Social History   Socioeconomic History  . Marital status: Married    Spouse name:  . Number of children: 3  . Years of education: college  . Highest education level: Not on file  Occupational History    Comment: Disabled  Tobacco Use  . Smoking status: Light Tobacco Smoker    Types: Cigars  . Smokeless  tobacco: Never Used  Vaping Use  . Vaping Use: Never used  Substance and Sexual Activity  . Alcohol use: Yes    Comment: Once per month.  . Drug use: No  . Sexual activity: Not on file  Other Topics Concern  . Not on file  Social History Narrative   Patient is married and lives at home with his wife Caren Griffins .   Disabled.   Education [employer].   Right handed.   1 cup caffeine per day.   Social Determinants of Health   Financial Resource Strain: Not on file  Food Insecurity: Not on file  Transportation Needs: Not on file  Physical Activity: Not on file  Stress: Not on file  Social  Connections: Not on file     Family History: The patient's family history includes Diabetes in his mother; High blood pressure in his father and mother; Stroke in his maternal grandmother and paternal grandmother.  ROS:   Please see the history of present illness.     All other systems reviewed and are negative.  EKGs/Labs/Other Studies Reviewed:    The following studies were reviewed today: Echocardiograms pending.  EKG:  EKG is  ordered today.  The ekg ordered today demonstrates sinus rhythm 70 nonspecific ST-T wave changes  Recent Labs: 06/11/2020: ALT 14; BUN 22; Creatinine, Ser 1.02; Hemoglobin 13.9; Platelets 209; Potassium 4.2; Sodium 141; TSH 2.030  Recent Lipid Panel    Component Value Date/Time   CHOL 142 06/02/2013 0640   TRIG 81 06/02/2013 0640   HDL 34 (L) 06/02/2013 0640   CHOLHDL 4.2 06/02/2013 0640   VLDL 16 06/02/2013 0640   LDLCALC 92 06/02/2013 0640     Risk Assessment/Calculations:     CHA2DS2-VASc Score = 2  This indicates a 2.2% annual risk of stroke. The patient's score is based upon: CHF History: No HTN History: Yes Diabetes History: Yes Stroke History: No Vascular Disease History: No Age Score: 0 Gender Score: 0      Physical Exam:    VS:  BP 138/90 (BP Location: Left Arm, Patient Position: Sitting, Cuff Size: Large)   Pulse 70   Ht 5\' 10"  (1.778 m)   Wt 240 lb 3.2 oz (109 kg)   BMI 34.47 kg/m     Wt Readings from Last 3 Encounters:  11/29/20 240 lb 3.2 oz (109 kg)  11/11/20 237 lb (107.5 kg)  06/11/20 238 lb (108 kg)     GEN:  Well nourished, well developed in no acute distress HEENT: Normal NECK: No JVD; No carotid bruits LYMPHATICS: No lymphadenopathy CARDIAC: RRR, no murmurs, rubs, gallops RESPIRATORY:  Clear to auscultation without rales, wheezing or rhonchi  ABDOMEN: Soft, non-tender, non-distended MUSCULOSKELETAL:  No edema; No deformity  SKIN: Warm and dry NEUROLOGIC:  Alert and oriented x 3 PSYCHIATRIC:  Normal  affect   ASSESSMENT:    1. PAF (paroxysmal atrial fibrillation) ([place])   2. Primary hypertension    PLAN:    In order of problems listed above:  Atrial fibrillation paroxysmal - In December 2021 we started Toprol-XL 50 mg once a day secondary to heart rate of approximately 99 bpm. - We also started Xarelto 20 mg a day given CHA2DS2-VASc of 2 for diabetes and hypertension. - We stopped his Plavix 75 mg a day. - Echo is currently pending.  We will order again. - His EKG this Morning shows sinus rhythm.  He has converted from atrial fibrillation.  Excellent.  Does not require cardioversion.  Multiple  sclerosis - According to prior notes likely did not have stroke in the past.  This may have been secondary to multiple sclerosis episode.  Medications per neurology.  Essential hypertension - No changes made multidrug regimen.  Dyspnea on exertion - Could have been related to atrial fibrillation.  Now in sinus rhythm.  Excellent.   8-month follow-up   Medication Adjustments/Labs and Tests Ordered: Current medicines are reviewed at length with the patient today.  Concerns regarding medicines are outlined above.  Orders Placed This Encounter  Procedures  . EKG 12-Lead   No orders of the defined types were placed in this encounter.   Patient Instructions  Medication Instructions:  The current medical regimen is effective;  continue present plan and medications.  *If you need a refill on your cardiac medications before your next appointment, please call your pharmacy*  Testing/Procedures: Please have echocardiogram as scheduled. Your physician has requested that you have an echocardiogram. Echocardiography is a painless test that uses sound waves to create images of your heart. It provides your doctor with information about the size and shape of your heart and how well your heart's chambers and valves are working. This procedure takes approximately one hour. There are no  restrictions for this procedure.  Follow-Up: At [facility], you and your health needs are our priority.  As part of our continuing mission to provide you with exceptional heart care, we have created designated Provider Care Teams.  These Care Teams include your primary Cardiologist (physician) and Advanced Practice Providers (APPs -  Physician Assistants and Nurse Practitioners) who all work together to provide you with the care you need, when you need it.  We recommend signing up for the patient portal called "MyChart".  Sign up information is provided on this After Visit Summary.  MyChart is used to connect with patients for Virtual Visits (Telemedicine).  Patients are able to view lab/test results, encounter notes, upcoming appointments, etc.  Non-urgent messages can be sent to your provider as well.   To learn more about what you can do with MyChart, go to [email or web address].    Your next appointment:   6 month(s)  The format for your next appointment:   In Person  Provider:   Donato Schultz, MD   Thank you for choosing [facility]!!        Signed, Donato Schultz, MD  11/29/2020 8:52 AM    [facility]

## 2020-11-29 NOTE — Patient Instructions (Signed)
Medication Instructions:  The current medical regimen is effective;  continue present plan and medications.  *If you need a refill on your cardiac medications before your next appointment, please call your pharmacy*  Testing/Procedures: Please have echocardiogram as scheduled. Your physician has requested that you have an echocardiogram. Echocardiography is a painless test that uses sound waves to create images of your heart. It provides your doctor with information about the size and shape of your heart and how well your heart's chambers and valves are working. This procedure takes approximately one hour. There are no restrictions for this procedure.  Follow-Up: At [facility], you and your health needs are our priority.  As part of our continuing mission to provide you with exceptional heart care, we have created designated Provider Care Teams.  These Care Teams include your primary Cardiologist (physician) and Advanced Practice Providers (APPs -  Physician Assistants and Nurse Practitioners) who all work together to provide you with the care you need, when you need it.  We recommend signing up for the patient portal called "MyChart".  Sign up information is provided on this After Visit Summary.  MyChart is used to connect with patients for Virtual Visits (Telemedicine).  Patients are able to view lab/test results, encounter notes, upcoming appointments, etc.  Non-urgent messages can be sent to your provider as well.   To learn more about what you can do with MyChart, go to [email or web address].    Your next appointment:   6 month(s)  The format for your next appointment:   In Person  Provider:   Donato Schultz, MD   Thank you for choosing [facility]!!

## 2020-12-06 ENCOUNTER — Ambulatory Visit (HOSPITAL_COMMUNITY): Payer: Medicare PPO | Attending: Cardiovascular Disease

## 2020-12-06 ENCOUNTER — Other Ambulatory Visit: Payer: Self-pay

## 2020-12-06 DIAGNOSIS — I10 Essential (primary) hypertension: Secondary | ICD-10-CM | POA: Diagnosis not present

## 2020-12-06 DIAGNOSIS — I4891 Unspecified atrial fibrillation: Secondary | ICD-10-CM | POA: Diagnosis not present

## 2020-12-06 LAB — ECHOCARDIOGRAM COMPLETE
Area-P 1/2: 4.06 cm2
S' Lateral: 4.7 cm

## 2020-12-12 ENCOUNTER — Ambulatory Visit: Payer: Medicare PPO | Admitting: Neurology

## 2020-12-12 NOTE — Progress Notes (Deleted)
PATIENT: Thomas Nixon DOB: Mar 31, 1971  REASON FOR VISIT: follow up HISTORY FROM: patient  HISTORY OF PRESENT ILLNESS: Today 12/07/19  HISTORY  HISTORY  Thomas Nixon a 50 years old male, seen in refer by his primary care doctor Georgann Housekeeper for evaluation of gait abnormality, initial evaluation was on November twelfth 2018.  I reviewed and summarized referring note, he has history of hypertension, hyperlipidemia, diabetes, I saw him in 2011, lost follow-up, saw him again 2015, then lost follow-up again,  He had multiple strokelike episode in the past, started since his age twenties, he was treated at different medical facility, including [facility], [facility], [facility].  I have reviewed records from [facility] in 2002, with a diagnosis of acute demyelinating lesions, affecting both left, and right side of the brain, with severe left visual field cut, left neglect.  VEP was normal.   Lab showed normal or negative RPR, HIV, B12, folate acid, Lyme titer, ANA, TSH, protein electrophoresis, mild elevated CPK  MRI at [facility]: In 2014 has demonstrated extensive periventricular white matter disease, evidence of previous right parietal biopsy, hemosiderin deposit.  MRA of the brain was normal. ultrasound of carotid artery showed no significant large vessel disease.  Records from [facility], dated August 2009 by neurologist Dr. Lillia Dallas:  Laboratory evaluation,normaal fasting lipid profile,LDL was 53, negative HIV, RPR,VDRL, Athena Notch-3 Gene Mutation negative. norml CBC, INR,, CMP, other than mildly elevated Glu 128, A1C 6.4, TSH 1.5.  TTE: normal.  There were also mention of brain biopsy with demyelinating features, he did have improvement with steroid treatment, biopsy was read at [facility] clinic.  Over the past few years, he complains of slow worsening gait difficulty, slurred speech, he denies visual trouble, he  denies bowel and bladder incontinence.  Per records from [facility] orthopedic clinics, he has evidence of right lumbar radiculopathy, with MRI notable for bilateral L3 pars defect, 10 mm spondylolisthesis at L3 and 4, increased on flexion radiograph, the supported by positive EMG findings, he had lumbar decompression surgery by Dr. Ottis Stain in 2015, which has helped his low back pain,   MRI lumbar in January 2017, status post L3-4 fusion, no significant canal foraminal stenosis  MRI of brain in September 2015, confluent periventricular and subcortical T2 hyperintensity, hemosiderin deposit in the right frontal cortical and bilateral parietal periventricular region, consistent with chronic intracerebral hemorrhage. Right frontal craniotomy and biopsy sequelae noted, compared to MRI in 2010, there is mild progression of white matter disease.  MRI cervical spine, mild degenerative changes, there is no evidence of intrinsic spinal cord disease  He lives with his wife, take care of the house chore, he noted mild increased gait abnormality, slurred speech, mild worsening memory loss  UPDATE Dec 09 2017: He is accompanied by his wife at today's clinical visit, he continue complains of mild gait abnormality, memory loss, we were able to review MRI of the brain with and without contrast in November 2018, there was evidence of confluent white matter T2/FLAIR hyperintensity signal in the right greater than left hemisphere, possibility include genetic demyelinating disease, inflammatory demyelinating disease,  But based on previous brain biopsy in 2009, there was demyelinating features, he has significant symptom improvement with steroid treatment, suggestive of inflammatory component, he never received any long-term immunomodulation therapy,  I will refer him to lumbar puncture, he has missed multiple scheduled appointment in the past, we decided to proceed with ocrelizumab if lumbar puncture showed  typical  findings of elevated oligoclonal banding consistent with MS,  Update February 07, 2018: He is accompanied by his wife at today's clinical visit, who has known him since 2009, Mr. Pfalzgraf did have gradual decline over the past 10 years, worsening memory loss, gait abnormality, most recent flareups was In 2014, he presented with left side stroke like event  JC virus titer 2.83 in Jan 2019, his insurance has limited coverage for ocrelizumab, he wants to explore other treatment options, Tysabri is not a good option due to high JC virus titer  Spinal fluid testing showed more than 5 oligoclonal banding,  Laboratory evaluation showed normal negative very long chain fatty acid, arylsufatase A activity.  UPDATE May 16 2018: He tolerated Tecfidera very well, started since March 2019, no significant side effect noticed, there is no change in his functional status, physical therapy has been very helpful, still has gait difficulty, today he was noted to have pseudobulbar expression  UPDATE Jan 9th 2020: He seems to walk better, he tolerate Tecfidera bid,physical therapy was helpful,  We personally reviewed MRI of the brain with and without contrast in July 2019, no significant change compared to previous scan in 2018, confluent foci involving periventricular deep white matter is, in addition, there is some hemosiderin deposition scattered within the white matter,  Update May 30 2019 SS: Is still on Tecfidera BID. He has not had any flares. He denies numbness or weakness in arms or legs. No falls. No problems with bowels or bladder. No change is vision. He is on disability. He sleeps okay at night, but he wakes up in the night to use the bathroom. He lives with his wife. He has 3 adopted children. Speech is stable, he still talks fast. He has been on Tecfidera since March 2019, no problems. He feels more confident in himself, before he was having "flares that he thought were strokes". He walks  for exercise.   Update December 07, 2019 SS: MRI of the brain 06/27/2019 no acute findings, no change from prior MRI 05/29/2018.  He remains on Tecfidera 240 mg twice daily, tolerating medication without side effect.  He denies any new problems or concerns, no overall change.  I talked with his wife via FaceTime, she indicates he has been doing well, she has not noticed any problems.  No falls, does have some chronic gait instability and trouble with memory. There has been an issue with insurance, has been out of his medication for a few days.   MRI of the brain 06/27/2019 MRI brain (with and without) demonstrating: -Extensive bilateral white matter T2 hyperintensities which are confluent, may be related to chronic demyelination or chronic ischemic changes. -No acute findings. No change from MRI on 05/29/18.  Update June 11, 2020: He is overall stable, tolerating vumerity without any difficulty, continue have mild gait abnormality, memory loss, is able to take care of his 3 children at home, driving without much difficulty  Update December 12, 2020 SS: At last visit in July 2021, A1c elevated 7, mildly low vitamin D 26.5, continue vitamin D3 supplement 1000 units daily, B12, TSH were normal, Absolute lymphocyte 1.7.  MRI of the brain with and without contrast in September 2021 showed no significant change compared to August 2020.   REVIEW OF SYSTEMS: Out of a complete 14 system review of symptoms, the patient complains only of the following symptoms, and all other reviewed systems are negative.  Gait abnormality  ALLERGIES: Allergies  Allergen Reactions  . Pork-Derived Products Other (  See Comments)    DIET RESTRICTIONS    HOME MEDICATIONS: Outpatient Medications Prior to Visit  Medication Sig Dispense Refill  . ACCU-CHEK GUIDE test strip     . amLODipine (NORVASC) 10 MG tablet Take 10 mg daily by mouth.  3  . atorvastatin (LIPITOR) 40 MG tablet Take 1 tablet (40 mg total) by mouth at bedtime.  30 tablet 1  . Dimethyl Fumarate (TECFIDERA) 240 MG CPDR Take 1 capsule (240 mg total) by mouth 2 (two) times daily. 180 capsule 3  . hydrochlorothiazide (HYDRODIURIL) 25 MG tablet Take 25 mg daily by mouth.  11  . irbesartan (AVAPRO) 300 MG tablet Take 300 mg daily by mouth.  11  . LYRICA 300 MG capsule Take 300 mg 2 (two) times daily by mouth.  2  . metFORMIN (GLUCOPHAGE) 500 MG tablet Take 500 mg by mouth 2 (two) times daily with a meal.    . traMADol (ULTRAM) 50 MG tablet Take 50 mg 2 (two) times daily by mouth.  3  . clopidogrel (PLAVIX) 75 MG tablet Take 75 mg daily by mouth.  1   No facility-administered medications prior to visit.    PAST MEDICAL HISTORY: Past Medical History:  Diagnosis Date  . CVA (cerebral infarction)   . Diabetes mellitus without complication (HCC)    Type 2  . Encounter for long-term (current) use of other medications   . Headache(784.0)   . Hypertension   . MS (multiple sclerosis) (HCC)    possible  . Neuropathy   . Other and unspecified hyperlipidemia   . Stroke (HCC)    5 strokes  . Thoracic or lumbosacral neuritis or radiculitis, unspecified   . Unspecified late effects of cerebrovascular disease   . Unspecified sleep apnea    does not use cpap    PAST SURGICAL HISTORY: Past Surgical History:  Procedure Laterality Date  . ANTERIOR LAT LUMBAR FUSION N/A 08/30/2014   Procedure: ANTERIOR LATERAL LUMBAR FUSION 1 LEVEL;  Surgeon: Emilee Hero, MD;  Location: [facility] OR;  Service: Orthopedics;  Laterality: N/A;  Lumbar 3-4 lateral interbody fusion with instrumentation and allograft  . BRAIN BIOPSY      FAMILY HISTORY: Family History  Problem Relation Age of Onset  . High blood pressure Mother   . Diabetes Mother   . High blood pressure Father   . Stroke Maternal Grandmother   . Stroke Paternal Grandmother     SOCIAL HISTORY: Social History   Socioeconomic History  . Marital status: Married    Spouse name: [name]  . Number of  children: 3  . Years of education: college  . Highest education level: Not on file  Occupational History    Comment: Disabled  Tobacco Use  . Smoking status: Light Tobacco Smoker    Types: Cigars  . Smokeless tobacco: Never Used  Substance and Sexual Activity  . Alcohol use: Yes    Comment: Once per month.  . Drug use: No  . Sexual activity: Not on file  Other Topics Concern  . Not on file  Social History Narrative   Patient is married and lives at home with his wife [name] .   Disabled.   Education [employer].   Right handed.   1 cup caffeine per day.   Social Determinants of Health   Financial Resource Strain:   . Difficulty of Paying Living Expenses: Not on file  Food Insecurity:   . Worried About Programme researcher, broadcasting/film/video in the Last Year: Not  on file  . Ran Out of Food in the Last Year: Not on file  Transportation Needs:   . Lack of Transportation (Medical): Not on file  . Lack of Transportation (Non-Medical): Not on file  Physical Activity:   . Days of Exercise per Week: Not on file  . Minutes of Exercise per Session: Not on file  Stress:   . Feeling of Stress : Not on file  Social Connections:   . Frequency of Communication with Friends and Family: Not on file  . Frequency of Social Gatherings with Friends and Family: Not on file  . Attends Religious Services: Not on file  . Active Member of Clubs or Organizations: Not on file  . Attends Banker Meetings: Not on file  . Marital Status: Not on file  Intimate Partner Violence:   . Fear of Current or Ex-Partner: Not on file  . Emotionally Abused: Not on file  . Physically Abused: Not on file  . Sexually Abused: Not on file   PHYSICAL EXAM  Vitals:   12/07/19 1109  BP: (!) 158/97  Pulse: 80  Temp: 97.6 F (36.4 C)  Weight: 250 lb (113.4 kg)  Height: 5\' 9"  (1.753 m)   Body mass index is 36.92 kg/m.  PHYSICAL EXAMNIATION:  Gen: NAD, conversant, well nourised, well groomed                      Cardiovascular: Regular rate rhythm, no peripheral edema, warm, nontender. Eyes: Conjunctivae clear without exudates or hemorrhage Neck: Supple, no carotid bruits. Pulmonary: Clear to auscultation bilaterally   NEUROLOGICAL EXAM:  MENTAL STATUS: Speech/Cognition: Awake, alert, normal speech, oriented to history taking and casual conversation, mild slow reaction time  CRANIAL NERVES: CN II: Visual fields are full to confrontation.  Pupils are round equal and briskly reactive to light. CN III, IV, VI: extraocular movement are normal. No ptosis. CN V: Facial sensation is intact to light touch. CN VII: Face is symmetric with normal eye closure and smile. CN VIII: Hearing is normal to casual conversation CN IX, X: Palate elevates symmetrically. Phonation is normal. CN XI: Head turning and shoulder shrug are intact CN XII: Tongue is midline with normal movements and no atrophy.  MOTOR: Mild bilateral lower extremity spasticity, mild hip flexion weakness  REFLEXES: Reflexes are 2  and symmetric at the biceps, triceps, knees and ankles. Plantar responses are flexor.  SENSORY: Intact to light touch, pinprick, positional and vibratory sensation at fingers and toes.  COORDINATION: There is no trunk or limb ataxia.    GAIT/STANCE: He needs push-up to get up from seated position, cautious, mildly unsteady gait  DIAGNOSTIC DATA (LABS, IMAGING, TESTING) - I reviewed patient records, labs, notes, testing and imaging myself where available.  Lab Results  Component Value Date   WBC 7.5 05/30/2019   HGB 13.9 05/30/2019   HCT 42.8 05/30/2019   MCV 85 05/30/2019   PLT 249 05/30/2019      Component Value Date/Time   NA 143 05/30/2019 1021   K 4.8 05/30/2019 1021   CL 103 05/30/2019 1021   CO2 23 05/30/2019 1021   GLUCOSE 126 (H) 05/30/2019 1021   GLUCOSE 102 (H) 08/22/2014 0905   BUN 24 05/30/2019 1021   CREATININE 1.12 05/30/2019 1021   CALCIUM 9.6 05/30/2019 1021   PROT 7.0  05/30/2019 1021   ALBUMIN 4.6 05/30/2019 1021   ALBUMIN 4.3 01/06/2018 0856   AST 20 05/30/2019 1021   ALT  26 05/30/2019 1021   ALKPHOS 42 05/30/2019 1021   BILITOT 0.3 05/30/2019 1021   GFRNONAA 79 05/30/2019 1021   GFRAA 91 05/30/2019 1021   Lab Results  Component Value Date   CHOL 142 06/02/2013   HDL 34 (L) 06/02/2013   LDLCALC 92 06/02/2013   TRIG 81 06/02/2013   CHOLHDL 4.2 06/02/2013   Lab Results  Component Value Date   HGBA1C 6.2 (H) 06/01/2013   Lab Results  Component Value Date   VITAMINB12 619 10/04/2017   Lab Results  Component Value Date   TSH 1.860 05/16/2018   ASSESSMENT AND PLAN 50 year old male   1. Probable relapsing remitting multiple sclerosis 2. Gait abnormality 3. Mild cognitive impairment -MRI of the brain with and without contrast in September 2021 was overall stable from previous August 2020 -CSF has more than 5 oligoclonal banding -JCV titer elevated 2.83, patient is not a good candidate for Tysabri -Has been on Tecfidera since March 2019, tolerating it well, no acute symptoms, will switch to generic Vumerity since June 2021, tolerating it well -Check CBC, CMP today -Encouraged moderate exercise  4. Vitamin D deficiency -Continue vitamin D3 supplement 1000 units daily    I spent *** minutes of face-to-face and non-face-to-face time with patient.  This included previsit chart review, lab review, study review, order entry, electronic health record documentation, patient education.  Otila Kluver, DNP  [facility] [address] [place], [place] [address] [phone]

## 2020-12-14 ENCOUNTER — Telehealth: Payer: Self-pay | Admitting: Neurology

## 2020-12-14 NOTE — Telephone Encounter (Signed)
I could not find the document that mentioned in note (not under media)  " I have reviewed records from [facility] in 2002, with a diagnosis of acute demyelinating lesions, affecting both left, and right side of the brain, with severe left visual field cut, left neglect."  Could we get it again from [facility]? Or his wife has a copy?

## 2020-12-23 ENCOUNTER — Encounter: Payer: Self-pay | Admitting: Neurology

## 2020-12-23 ENCOUNTER — Ambulatory Visit: Payer: Medicare PPO | Admitting: Neurology

## 2020-12-23 VITALS — BP 128/80 | HR 69 | Ht 70.0 in | Wt 242.0 lb

## 2020-12-23 DIAGNOSIS — G35 Multiple sclerosis: Secondary | ICD-10-CM

## 2020-12-23 DIAGNOSIS — R269 Unspecified abnormalities of gait and mobility: Secondary | ICD-10-CM | POA: Diagnosis not present

## 2020-12-23 MED ORDER — VUMERITY 231 MG PO CPDR: DELAYED_RELEASE_CAPSULE | ORAL | 11 refills | Status: DC

## 2020-12-23 NOTE — Patient Instructions (Signed)
Check labs  Continue Vumerity  Try to get back into walking, exercises See you back in 6 months

## 2020-12-23 NOTE — Progress Notes (Signed)
PATIENT: Thomas Nixon DOB: 10/26/1971  REASON FOR VISIT: follow up HISTORY FROM: patient  HISTORY OF PRESENT ILLNESS: Today 12/07/19  HISTORY Thomas Nixon a 50 years old male, seen in refer by his primary care doctor Thomas Nixon for evaluation of gait abnormality, initial evaluation was on November twelfth 2018.  I reviewed and summarized referring note, he has history of hypertension, hyperlipidemia, diabetes, I saw him in 2011, lost follow-up, saw him again 2015, then lost follow-up again,  He had multiple strokelike episode in the past, started since his age twenties, he was treated at different medical facility, including [facility], [facility], [facility].  I have reviewed records from [facility] in 2002, with a diagnosis of acute demyelinating lesions, affecting both left, and right side of the brain, with severe left visual field cut, left neglect.  VEP was normal.   Lab showed normal or negative RPR, HIV, B12, folate acid, Lyme titer, ANA, TSH, protein electrophoresis, mild elevated CPK  MRI at [facility]: In 2014 has demonstrated extensive periventricular white matter disease, evidence of previous right parietal biopsy, hemosiderin deposit.  MRA of the brain was normal. ultrasound of carotid artery showed no significant large vessel disease.  Records from [facility], dated August 2009 by neurologist Dr. Lillia Nixon:  Laboratory evaluation,normaal fasting lipid profile,LDL was 53, negative HIV, RPR,VDRL, Athena Notch-3 Gene Mutation negative. norml CBC, INR,, CMP, other than mildly elevated Glu 128, A1C 6.4, TSH 1.5.  TTE: normal.  There were also mention of brain biopsy with demyelinating features, he did have improvement with steroid treatment, biopsy was read at [facility] clinic.  Over the past few years, he complains of slow worsening gait difficulty, slurred speech, he denies visual trouble, he denies bowel  and bladder incontinence.  Per records from [facility], he has evidence of right lumbar radiculopathy, with MRI notable for bilateral L3 pars defect, 10 mm spondylolisthesis at L3 and 4, increased on flexion radiograph, the supported by positive EMG findings, he had lumbar decompression surgery by Dr. Ottis Stain in 2015, which has helped his low back pain,   MRI lumbar in January 2017, status post L3-4 fusion, no significant canal foraminal stenosis  MRI of brain in September 2015, confluent periventricular and subcortical T2 hyperintensity, hemosiderin deposit in the right frontal cortical and bilateral parietal periventricular region, consistent with chronic intracerebral hemorrhage. Right frontal craniotomy and biopsy sequelae noted, compared to MRI in 2010, there is mild progression of white matter disease.  MRI cervical spine, mild degenerative changes, there is no evidence of intrinsic spinal cord disease  He lives with his wife, take care of the house chore, he noted mild increased gait abnormality, slurred speech, mild worsening memory loss  UPDATE Dec 09 2017: He is accompanied by his wife at today's clinical visit, he continue complains of mild gait abnormality, memory loss, we were able to review MRI of the brain with and without contrast in November 2018, there was evidence of confluent white matter T2/FLAIR hyperintensity signal in the right greater than left hemisphere, possibility include genetic demyelinating disease, inflammatory demyelinating disease,  But based on previous brain biopsy in 2009, there was demyelinating features, he has significant symptom improvement with steroid treatment, suggestive of inflammatory component, he never received any long-term immunomodulation therapy,  I will refer him to lumbar puncture, he has missed multiple scheduled appointment in the past, we decided to proceed with ocrelizumab if lumbar puncture showed typical  findings of elevated  oligoclonal banding consistent with MS,  Update February 07, 2018: He is accompanied by his wife at today's clinical visit, who has known him since 2009, Mr. Thomas Nixon did have gradual decline over the past 10 years, worsening memory loss, gait abnormality, most recent flareups was In 2014, he presented with left side stroke like event  JC virus titer 2.83 in Jan 2019, his insurance has limited coverage for ocrelizumab, he wants to explore other treatment options, Tysabri is not a good option due to high JC virus titer  Spinal fluid testing showed more than 5 oligoclonal banding,  Laboratory evaluation showed normal negative very long chain fatty acid, arylsufatase A activity.  UPDATE May 16 2018: He tolerated Tecfidera very well, started since March 2019, no significant side effect noticed, there is no change in his functional status, physical therapy has been very helpful, still has gait difficulty, today he was noted to have pseudobulbar expression  UPDATE Jan 9th 2020: He seems to walk better, he tolerate Tecfidera bid,physical therapy was helpful,  We personally reviewed MRI of the brain with and without contrast in July 2019, no significant change compared to previous scan in 2018, confluent foci involving periventricular deep white matter is, in addition, there is some hemosiderin deposition scattered within the white matter,  Update May 30 2019 SS: Is still on Tecfidera BID. He has not had any flares. He denies numbness or weakness in arms or legs. No falls. No problems with bowels or bladder. No change is vision. He is on disability. He sleeps okay at night, but he wakes up in the night to use the bathroom. He lives with his wife. He has 3 adopted children. Speech is stable, he still talks fast. He has been on Tecfidera since March 2019, no problems. He feels more confident in himself, before he was having "flares that he thought were strokes". He walks for  exercise.   Update December 07, 2019 SS: MRI of the brain 06/27/2019 no acute findings, no change from prior MRI 05/29/2018.  He remains on Tecfidera 240 mg twice daily, tolerating medication without side effect.  He denies any new problems or concerns, no overall change.  I talked with his wife via FaceTime, she indicates he has been doing well, she has not noticed any problems.  No falls, does have some chronic gait instability and trouble with memory. There has been an issue with insurance, has been out of his medication for a few days.   MRI of the brain 06/27/2019 MRI brain (with and without) demonstrating: -Extensive bilateral white matter T2 hyperintensities which are confluent, may be related to chronic demyelination or chronic ischemic changes. -No acute findings. No change from MRI on 05/29/18.  Update June 11, 2020: He is overall stable, tolerating vumerity without any difficulty, continue have mild gait abnormality, memory loss, is able to take care of his 3 children at home, driving without much difficulty  Update December 23, 2020 SS: At last visit in July 2021, A1c elevated 7, mildly low vitamin D 26.5, continue vitamin D3 supplement 1000 units daily, B12, TSH were normal, Absolute lymphocyte 1.7.  MRI of the brain with and without contrast in September 2021 showed no significant change compared to August 2020.  Remains overall stable, remains on Vumerity, tolerating well.  Denies any new or worsening MS symptoms.  No falls, continues with gait abnormality, is worried he may fall, has difficulty with the right leg at baseline.  Has gotten off track with his walking, stationary  bike riding.  Sees PCP twice a year, not sure they are aware of elevated A1c.  He is here today alone.  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  Well-appearing today.  Sees cardiology for paroxysmal A. fib, is on Xarelto.  REVIEW OF SYSTEMS: Out of a complete 14 system review of  symptoms, the patient complains only of the following symptoms, and all other reviewed systems are negative.  Gait abnormality  ALLERGIES: Allergies  Allergen Reactions  . Pork-Derived Products Other (See Comments)    DIET RESTRICTIONS    HOME MEDICATIONS: Outpatient Medications Prior to Visit  Medication Sig Dispense Refill  . ACCU-CHEK GUIDE test strip     . amLODipine (NORVASC) 10 MG tablet Take 10 mg daily by mouth.  3  . atorvastatin (LIPITOR) 40 MG tablet Take 1 tablet (40 mg total) by mouth at bedtime. 30 tablet 1  . Dimethyl Fumarate (TECFIDERA) 240 MG CPDR Take 1 capsule (240 mg total) by mouth 2 (two) times daily. 180 capsule 3  . hydrochlorothiazide (HYDRODIURIL) 25 MG tablet Take 25 mg daily by mouth.  11  . irbesartan (AVAPRO) 300 MG tablet Take 300 mg daily by mouth.  11  . LYRICA 300 MG capsule Take 300 mg 2 (two) times daily by mouth.  2  . metFORMIN (GLUCOPHAGE) 500 MG tablet Take 500 mg by mouth 2 (two) times daily with a meal.    . traMADol (ULTRAM) 50 MG tablet Take 50 mg 2 (two) times daily by mouth.  3  . clopidogrel (PLAVIX) 75 MG tablet Take 75 mg daily by mouth.  1   No facility-administered medications prior to visit.    PAST MEDICAL HISTORY: Past Medical History:  Diagnosis Date  . CVA (cerebral infarction)   . Diabetes mellitus without complication (HCC)    Type 2  . Encounter for long-term (current) use of other medications   . Headache(784.0)   . Hypertension   . MS (multiple sclerosis) (HCC)    possible  . Neuropathy   . Other and unspecified hyperlipidemia   . Stroke (HCC)    5 strokes  . Thoracic or lumbosacral neuritis or radiculitis, unspecified   . Unspecified late effects of cerebrovascular disease   . Unspecified sleep apnea    does not use cpap    PAST SURGICAL HISTORY: Past Surgical History:  Procedure Laterality Date  . ANTERIOR LAT LUMBAR FUSION N/A 08/30/2014   Procedure: ANTERIOR LATERAL LUMBAR FUSION 1 LEVEL;  Surgeon:  Emilee Hero, MD;  Location: [facility] OR;  Service: Orthopedics;  Laterality: N/A;  Lumbar 3-4 lateral interbody fusion with instrumentation and allograft  . BRAIN BIOPSY      FAMILY HISTORY: Family History  Problem Relation Age of Onset  . High blood pressure Mother   . Diabetes Mother   . High blood pressure Father   . Stroke Maternal Grandmother   . Stroke Paternal Grandmother     SOCIAL HISTORY: Social History   Socioeconomic History  . Marital status: Married    Spouse name: [name]  . Number of children: 3  . Years of education: college  . Highest education level: Not on file  Occupational History    Comment: Disabled  Tobacco Use  . Smoking status: Light Tobacco Smoker    Types: Cigars  . Smokeless tobacco: Never Used  Substance and Sexual Activity  . Alcohol use: Yes    Comment: Once per month.  . Drug use: No  .  Sexual activity: Not on file  Other Topics Concern  . Not on file  Social History Narrative   Patient is married and lives at home with his wife [name] .   Disabled.   Education [employer].   Right handed.   1 cup caffeine per day.   Social Determinants of Health   Financial Resource Strain:   . Difficulty of Paying Living Expenses: Not on file  Food Insecurity:   . Worried About Programme researcher, broadcasting/film/video in the Last Year: Not on file  . Ran Out of Food in the Last Year: Not on file  Transportation Needs:   . Lack of Transportation (Medical): Not on file  . Lack of Transportation (Non-Medical): Not on file  Physical Activity:   . Days of Exercise per Week: Not on file  . Minutes of Exercise per Session: Not on file  Stress:   . Feeling of Stress : Not on file  Social Connections:   . Frequency of Communication with Friends and Family: Not on file  . Frequency of Social Gatherings with Friends and Family: Not on file  . Attends Religious Services: Not on file  . Active Member of Clubs or Organizations: Not on file  . Attends Tax inspector Meetings: Not on file  . Marital Status: Not on file  Intimate Partner Violence:   . Fear of Current or Ex-Partner: Not on file  . Emotionally Abused: Not on file  . Physically Abused: Not on file  . Sexually Abused: Not on file   PHYSICAL EXAM  Vitals:   12/07/19 1109  BP: (!) 158/97  Pulse: 80  Temp: 97.6 F (36.4 C)  Weight: 250 lb (113.4 kg)  Height: 5\' 9"  (1.753 m)   Body mass index is 36.92 kg/m.  PHYSICAL EXAMNIATION:  Gen: NAD, conversant, well nourised, well groomed                     Cardiovascular: Regular rate rhythm, no peripheral edema, warm, nontender. Eyes: Conjunctivae clear without exudates or hemorrhage Pulmonary: Clear to auscultation bilaterally   NEUROLOGICAL EXAM:  MENTAL STATUS: Speech/Cognition: Awake, alert, normal speech, oriented to history taking and casual conversation, mild slow reaction time  CRANIAL NERVES: CN II: Visual fields are full to confrontation.  Pupils are round equal and briskly reactive to light. CN III, IV, VI: extraocular movement are normal. No ptosis. CN V: Facial sensation is intact to light touch. CN VII: Face is symmetric with normal eye closure and smile. CN VIII: Hearing is normal to casual conversation CN XI: Head turning and shoulder shrug are intact  MOTOR: Mild bilateral lower extremity spasticity, mild hip flexion weakness  REFLEXES: Symmetric, 2+  SENSORY: Intact to light touch sensation   COORDINATION: There is no trunk or limb ataxia.    GAIT/STANCE: He needs push-up to get up from seated position, cautious, mildly unsteady gait, tends to drag right leg  DIAGNOSTIC DATA (LABS, IMAGING, TESTING) - I reviewed patient records, labs, notes, testing and imaging myself where available.  Lab Results  Component Value Date   WBC 7.5 05/30/2019   HGB 13.9 05/30/2019   HCT 42.8 05/30/2019   MCV 85 05/30/2019   PLT 249 05/30/2019      Component Value Date/Time   NA 143 05/30/2019  1021   K 4.8 05/30/2019 1021   CL 103 05/30/2019 1021   CO2 23 05/30/2019 1021   GLUCOSE 126 (H) 05/30/2019 1021   GLUCOSE 102 (H)  08/22/2014 0905   BUN 24 05/30/2019 1021   CREATININE 1.12 05/30/2019 1021   CALCIUM 9.6 05/30/2019 1021   PROT 7.0 05/30/2019 1021   ALBUMIN 4.6 05/30/2019 1021   ALBUMIN 4.3 01/06/2018 0856   AST 20 05/30/2019 1021   ALT 26 05/30/2019 1021   ALKPHOS 42 05/30/2019 1021   BILITOT 0.3 05/30/2019 1021   GFRNONAA 79 05/30/2019 1021   GFRAA 91 05/30/2019 1021   Lab Results  Component Value Date   CHOL 142 06/02/2013   HDL 34 (L) 06/02/2013   LDLCALC 92 06/02/2013   TRIG 81 06/02/2013   CHOLHDL 4.2 06/02/2013   Lab Results  Component Value Date   HGBA1C 6.2 (H) 06/01/2013   Lab Results  Component Value Date   VITAMINB12 619 10/04/2017   Lab Results  Component Value Date   TSH 1.860 05/16/2018   ASSESSMENT AND PLAN 50 year old male   1. Probable relapsing remitting multiple sclerosis 2. Gait abnormality 3. Mild cognitive impairment -MRI of the brain with and without contrast in September 2021 was overall stable from previous August 2020 -CSF has more than 5 oligoclonal banding -JCV titer elevated 2.83, patient is not a good candidate for Tysabri -Has been on Tecfidera since March 2019, tolerating it well, no acute symptoms, switched to Vumerity since June 2021, tolerating it well -Check CBC, CMP today -Encouraged moderate exercise  4. Vitamin D deficiency -Continue vitamin D3 supplement 1000 units daily   I spent 30 minutes of face-to-face and non-face-to-face time with patient.  This included previsit chart review, lab review, study review, order entry, electronic health record documentation, patient education.  Otila Kluver, DNP  [facility] [address] [place], [place] [address] [phone]

## 2020-12-24 LAB — COMPREHENSIVE METABOLIC PANEL
ALT: 15 IU/L (ref 0–44)
AST: 14 IU/L (ref 0–40)
Albumin/Globulin Ratio: 1.7 (ref 1.2–2.2)
Albumin: 4.5 g/dL (ref 4.0–5.0)
Alkaline Phosphatase: 39 IU/L — ABNORMAL LOW (ref 44–121)
BUN/Creatinine Ratio: 26 — ABNORMAL HIGH (ref 9–20)
BUN: 25 mg/dL — ABNORMAL HIGH (ref 6–24)
Bilirubin Total: 0.5 mg/dL (ref 0.0–1.2)
CO2: 25 mmol/L (ref 20–29)
Calcium: 9 mg/dL (ref 8.7–10.2)
Chloride: 104 mmol/L (ref 96–106)
Creatinine, Ser: 0.97 mg/dL (ref 0.76–1.27)
GFR calc Af Amer: 107 mL/min/{1.73_m2} (ref >59)
GFR calc non Af Amer: 93 mL/min/{1.73_m2} (ref >59)
Globulin, Total: 2.6 g/dL (ref 1.5–4.5)
Glucose: 123 mg/dL — ABNORMAL HIGH (ref 65–99)
Potassium: 4 mmol/L (ref 3.5–5.2)
Sodium: 142 mmol/L (ref 134–144)
Total Protein: 7.1 g/dL (ref 6.0–8.5)

## 2020-12-24 LAB — CBC WITH DIFFERENTIAL/PLATELET
Basophils Absolute: 0 10*3/uL (ref 0.0–0.2)
Basos: 1 %
EOS (ABSOLUTE): 0.1 10*3/uL (ref 0.0–0.4)
Eos: 1 %
Hematocrit: 43.3 % (ref 37.5–51.0)
Hemoglobin: 13.7 g/dL (ref 13.0–17.7)
Immature Grans (Abs): 0 10*3/uL (ref 0.0–0.1)
Immature Granulocytes: 0 %
Lymphocytes Absolute: 1.5 10*3/uL (ref 0.7–3.1)
Lymphs: 23 %
MCH: 26.9 pg (ref 26.6–33.0)
MCHC: 31.6 g/dL (ref 31.5–35.7)
MCV: 85 fL (ref 79–97)
Monocytes Absolute: 0.5 10*3/uL (ref 0.1–0.9)
Monocytes: 7 %
Neutrophils Absolute: 4.3 10*3/uL (ref 1.4–7.0)
Neutrophils: 68 %
Platelets: 230 10*3/uL (ref 150–450)
RBC: 5.09 x10E6/uL (ref 4.14–5.80)
RDW: 13.2 % (ref 11.6–15.4)
WBC: 6.3 10*3/uL (ref 3.4–10.8)

## 2020-12-26 NOTE — Progress Notes (Signed)
I have reviewed and agreed above plan. 

## 2021-01-08 ENCOUNTER — Telehealth: Payer: Self-pay | Admitting: Neurology

## 2021-01-08 NOTE — Telephone Encounter (Signed)
Pt is asking for a call to discuss his  (VUMERITY)

## 2021-01-09 NOTE — Telephone Encounter (Signed)
I called pt and he is asking about refills for his vumerity.  Instructed that prescription was done 12-23-2020 to [facility] and I gave him # to call.  He will do now and let us know if problem.

## 2021-04-08 DIAGNOSIS — Z1389 Encounter for screening for other disorder: Secondary | ICD-10-CM | POA: Diagnosis not present

## 2021-04-08 DIAGNOSIS — I480 Paroxysmal atrial fibrillation: Secondary | ICD-10-CM | POA: Diagnosis not present

## 2021-04-08 DIAGNOSIS — I10 Essential (primary) hypertension: Secondary | ICD-10-CM | POA: Diagnosis not present

## 2021-04-08 DIAGNOSIS — R269 Unspecified abnormalities of gait and mobility: Secondary | ICD-10-CM | POA: Diagnosis not present

## 2021-04-08 DIAGNOSIS — G35 Multiple sclerosis: Secondary | ICD-10-CM | POA: Diagnosis not present

## 2021-04-08 DIAGNOSIS — E7800 Pure hypercholesterolemia, unspecified: Secondary | ICD-10-CM | POA: Diagnosis not present

## 2021-04-08 DIAGNOSIS — Z0000 Encounter for general adult medical examination without abnormal findings: Secondary | ICD-10-CM | POA: Diagnosis not present

## 2021-04-08 DIAGNOSIS — E1140 Type 2 diabetes mellitus with diabetic neuropathy, unspecified: Secondary | ICD-10-CM | POA: Diagnosis not present

## 2021-04-08 DIAGNOSIS — Z6835 Body mass index (BMI) 35.0-35.9, adult: Secondary | ICD-10-CM | POA: Diagnosis not present

## 2021-04-08 DIAGNOSIS — Z8673 Personal history of transient ischemic attack (TIA), and cerebral infarction without residual deficits: Secondary | ICD-10-CM | POA: Diagnosis not present

## 2021-04-09 DIAGNOSIS — I10 Essential (primary) hypertension: Secondary | ICD-10-CM | POA: Diagnosis not present

## 2021-04-09 DIAGNOSIS — E1140 Type 2 diabetes mellitus with diabetic neuropathy, unspecified: Secondary | ICD-10-CM | POA: Diagnosis not present

## 2021-05-20 ENCOUNTER — Other Ambulatory Visit: Payer: Self-pay | Admitting: *Deleted

## 2021-05-20 MED ORDER — VUMERITY 231 MG PO CPDR: DELAYED_RELEASE_CAPSULE | ORAL | 11 refills | Status: DC

## 2021-05-20 NOTE — Telephone Encounter (Signed)
Received fax concerning Free Drug Pt assistance program for Vumerity.  [facility] is pharmacy for them.  Pt has appt 06-2021.  Escribed to [facility] for Vumerity.

## 2021-05-29 ENCOUNTER — Other Ambulatory Visit: Payer: Self-pay | Admitting: Cardiology

## 2021-05-29 NOTE — Telephone Encounter (Signed)
Prescription refill request for Xarelto received.  Indication: Atrial fib Last office visit: 11/29/20 Michele Rockers MD Weight: 109kg Age: 50 Scr: 0.97 on 12/05/20 CrCl: 145.15  Based on above findings Xarelto 20mg  daily is the appropriate dose.  Refill approved.

## 2021-06-23 ENCOUNTER — Encounter: Payer: Self-pay | Admitting: Family Medicine

## 2021-06-23 ENCOUNTER — Ambulatory Visit: Payer: Medicare PPO | Admitting: Neurology

## 2021-06-23 ENCOUNTER — Ambulatory Visit: Payer: Medicare PPO | Admitting: Family Medicine

## 2021-06-23 VITALS — BP 147/83 | HR 65 | Ht 69.0 in | Wt 246.8 lb

## 2021-06-23 DIAGNOSIS — G35 Multiple sclerosis: Secondary | ICD-10-CM

## 2021-06-23 DIAGNOSIS — R269 Unspecified abnormalities of gait and mobility: Secondary | ICD-10-CM | POA: Diagnosis not present

## 2021-06-23 DIAGNOSIS — E559 Vitamin D deficiency, unspecified: Secondary | ICD-10-CM

## 2021-06-23 NOTE — Progress Notes (Signed)
Chief Complaint  Patient presents with   Follow-up    New rm, alone. Here for 6 month MS f/u, pt reports no change since last OV. Reports no falls and doing well with medications.    Neurologist: Dr Terrace Arabia  HISTORY OF PRESENT ILLNESS: 06/23/21 ALL:  Thomas Nixon is a 50 y.o. male here today for follow up for RRMS.  Thomas Nixon is followed routinely by Dr Terrace Arabia and Margie Ege, DNP. He has continued Vumerity and tolerating well. MRI 2021 was stable. Labs have been unremarkable with exception of elevated JCV and low vitamin D.   He is doing well. No new or exacerbating symptoms. He feels that gait is stable. No falls. He is sleeping well. He feels mood is good. No changes to bowel or bladder habits. No vision changes.   He continues vitamin D supplements 1000iu daily. He admits that he is not always consistent with taking supplement.   Update December 23, 2020 SS: At last visit in July 2021, A1c elevated 7, mildly low vitamin D 26.5, continue vitamin D3 supplement 1000 units daily, B12, TSH were normal, Absolute lymphocyte 1.7.   MRI of the brain with and without contrast in September 2021 showed no significant change compared to August 2020.   Remains overall stable, remains on Vumerity, tolerating well.  Denies any new or worsening MS symptoms.  No falls, continues with gait abnormality, is worried he may fall, has difficulty with the right leg at baseline.  Has gotten off track with his walking, stationary bike riding.   Sees PCP twice a year, not sure they are aware of elevated A1c.  He is here today alone.  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  Well-appearing today.   Sees cardiology for paroxysmal A. fib, is on Xarelto.   REVIEW OF SYSTEMS: Out of a complete 14 system review of symptoms, the patient complains only of the following symptoms, fatigue, weakness of right lower extremity and all other reviewed systems are negative.   ALLERGIES: Allergies   Allergen Reactions   Pork-Derived Products Other (See Comments)    DIET RESTRICTIONS     HOME MEDICATIONS: Outpatient Medications Prior to Visit  Medication Sig Dispense Refill   ACCU-CHEK GUIDE test strip      amLODipine (NORVASC) 10 MG tablet Take 10 mg daily by mouth.  3   atorvastatin (LIPITOR) 40 MG tablet Take 1 tablet (40 mg total) by mouth at bedtime. 30 tablet 1   Cholecalciferol (VITAMIN D3 PO) Take 1,000 Units by mouth daily.     hydrochlorothiazide (HYDRODIURIL) 25 MG tablet Take 25 mg daily by mouth.  11   irbesartan (AVAPRO) 300 MG tablet Take 300 mg daily by mouth.  11   losartan (COZAAR) 100 MG tablet Take 100 mg by mouth daily.     LYRICA 300 MG capsule Take 300 mg 2 (two) times daily by mouth.  2   metFORMIN (GLUCOPHAGE) 1000 MG tablet Take 1,000 mg by mouth 2 (two) times daily with a meal.     metoprolol succinate (TOPROL-XL) 50 MG 24 hr tablet Take 1 tablet (50 mg total) by mouth daily. Take with or immediately following a meal. 90 tablet 3   rivaroxaban (XARELTO) 20 MG TABS tablet TAKE 1 TABLET BY MOUTH DAILY WITH SUPPER. 30 tablet 3   traMADol (ULTRAM) 50 MG tablet Take 50 mg 2 (two) times daily by mouth.  3   VUMERITY 231 MG CPDR 432mg  po  bid maintenance. 120 capsule 11   No facility-administered medications prior to visit.     PAST MEDICAL HISTORY: Past Medical History:  Diagnosis Date   CVA (cerebral infarction)    Diabetes mellitus without complication (HCC)    Type 2   Encounter for long-term (current) use of other medications    Headache(784.0)    Hypertension    MS (multiple sclerosis) (HCC)    possible   Neuropathy    Other and unspecified hyperlipidemia    Stroke (HCC)    5 strokes   Thoracic or lumbosacral neuritis or radiculitis, unspecified    Unspecified late effects of cerebrovascular disease    Unspecified sleep apnea    does not use cpap     PAST SURGICAL HISTORY: Past Surgical History:  Procedure Laterality Date   ANTERIOR  LAT LUMBAR FUSION N/A 08/30/2014   Procedure: ANTERIOR LATERAL LUMBAR FUSION 1 LEVEL;  Surgeon: Emilee Hero, MD;  Location: [facility];  Service: Orthopedics;  Laterality: N/A;  Lumbar 3-4 lateral interbody fusion with instrumentation and allograft   BRAIN BIOPSY       FAMILY HISTORY: Family History  Problem Relation Age of Onset   High blood pressure Mother    Diabetes Mother    High blood pressure Father    Stroke Maternal Grandmother    Stroke Paternal Grandmother      SOCIAL HISTORY: Social History   Socioeconomic History   Marital status: Married    Spouse name: [name]   Number of children: 3   Years of education: college   Highest education level: Not on file  Occupational History    Comment: Disabled  Tobacco Use   Smoking status: Light Smoker    Types: Cigars   Smokeless tobacco: Never  Vaping Use   Vaping Use: Never used  Substance and Sexual Activity   Alcohol use: Yes    Comment: Once per month.   Drug use: No   Sexual activity: Not on file  Other Topics Concern   Not on file  Social History Narrative   Patient is married and lives at home with his wife [name] .   Disabled.   Education [employer].   Right handed.   1 cup caffeine per day.   Social Determinants of Health   Financial Resource Strain: Not on file  Food Insecurity: Not on file  Transportation Needs: Not on file  Physical Activity: Not on file  Stress: Not on file  Social Connections: Not on file  Intimate Partner Violence: Not on file     PHYSICAL EXAM  Vitals:   06/23/21 1358  BP: (!) 147/83  Pulse: 65  Weight: 246 lb 12.8 oz (111.9 kg)  Height: 5\' 9"  (1.753 m)   Body mass index is 36.45 kg/m.   Generalized: Well developed, in no acute distress  Cardiology: normal rate and rhythm, no murmur auscultated  Respiratory: clear to auscultation bilaterally    Neurological examination  Mentation: Alert oriented to time, place, history taking. Follows all commands  speech and language fluent Cranial nerve II-XII: Pupils were equal round reactive to light. Extraocular movements were full, visual field were full on confrontational test. Facial sensation and strength were normal. Head turning and shoulder shrug  were normal and symmetric. Motor: The motor testing reveals 5 over 5 strength of all 4 extremities. Good symmetric motor tone is noted throughout.   Sensory: Sensory testing is intact to soft touch on all 4 extremities. No evidence of extinction is noted.  Coordination: Cerebellar testing reveals good finger-nose-finger and heel-to-shin bilaterally.  Gait and station: Able to push to standing position. Gait is cautious, no assistive device, mildly spastic, drags right foot. Tandem not attempted  Reflexes: Deep tendon reflexes are symmetric and normal bilaterally.    DIAGNOSTIC DATA (LABS, IMAGING, TESTING) - I reviewed patient records, labs, notes, testing and imaging myself where available.  Lab Results  Component Value Date   WBC 6.3 12/23/2020   HGB 13.7 12/23/2020   HCT 43.3 12/23/2020   MCV 85 12/23/2020   PLT 230 12/23/2020      Component Value Date/Time   NA 142 12/23/2020 0842   K 4.0 12/23/2020 0842   CL 104 12/23/2020 0842   CO2 25 12/23/2020 0842   GLUCOSE 123 (H) 12/23/2020 0842   GLUCOSE 102 (H) 08/22/2014 0905   BUN 25 (H) 12/23/2020 0842   CREATININE 0.97 12/23/2020 0842   CALCIUM 9.0 12/23/2020 0842   PROT 7.1 12/23/2020 0842   ALBUMIN 4.5 12/23/2020 0842   ALBUMIN 4.3 01/06/2018 0856   AST 14 12/23/2020 0842   ALT 15 12/23/2020 0842   ALKPHOS 39 (L) 12/23/2020 0842   BILITOT 0.5 12/23/2020 0842   GFRNONAA 93 12/23/2020 0842   GFRAA 107 12/23/2020 0842   Lab Results  Component Value Date   CHOL 142 06/02/2013   HDL 34 (L) 06/02/2013   LDLCALC 92 06/02/2013   TRIG 81 06/02/2013   CHOLHDL 4.2 06/02/2013   Lab Results  Component Value Date   HGBA1C 7.0 (H) 06/11/2020   Lab Results  Component Value Date    VITAMINB12 414 06/11/2020   Lab Results  Component Value Date   TSH 2.030 06/11/2020    MMSE - Mini Mental State Exam 02/07/2018 07/25/2014  Orientation to time 4 5  Orientation to Place 3 5  Registration 3 3  Attention/ Calculation 3 5  Recall 2 3  Language- name 2 objects 2 2  Language- repeat 1 1  Language- follow 3 step command 3 3  Language- read & follow direction 1 1  Write a sentence 1 1  Copy design 1 0  Total score 24 29     No flowsheet data found.   ASSESSMENT AND PLAN  50 y.o. year old male  has a past medical history of CVA (cerebral infarction), Diabetes mellitus without complication (HCC), Encounter for long-term (current) use of other medications, Headache(784.0), Hypertension, MS (multiple sclerosis) (HCC), Neuropathy, Other and unspecified hyperlipidemia, Stroke [facility]), Thoracic or lumbosacral neuritis or radiculitis, unspecified, Unspecified late effects of cerebrovascular disease, and Unspecified sleep apnea. here with    Multiple sclerosis (HCC) - Plan: CBC with Differential/Platelets, CMP  Vitamin D deficiency - Plan: Vitamin D, 25-hydroxy  Gait abnormality  Thomas Nixon feels he is doing well, today. No new or exacerbating symptoms. He will continue Vumerity as prescribed. We will update labs. He will continue vitamin D 1000iu daily, will adjust pending labs. Healthy lifestyle habits encouraged. He will follow up with Dr Terrace Arabia in 6 months. He verbalizes understanding and agreement with plan.   Orders Placed This Encounter  Procedures   CBC with Differential/Platelets   CMP   Vitamin D, 25-hydroxy      No orders of the defined types were placed in this encounter.     Shawnie Dapper, MSN, FNP-C 06/23/2021, 2:26 PM  [facility] [address], [address] [place], [place] [address] [phone]

## 2021-06-23 NOTE — Patient Instructions (Addendum)
Below is our plan:  We will continue Vumerity as prescribed. I will update labs, today. We will adjust vitamin d dose if needed. Continue 1000iu for now.   Please make sure you are staying well hydrated. I recommend 50-60 ounces daily. Well balanced diet and regular exercise encouraged. Consistent sleep schedule with 6-8 hours recommended.   Please continue follow up with care team as directed.   Follow up with Dr Terrace Arabia in 6 months   You may receive a survey regarding today's visit. I encourage you to leave honest feed back as I do use this information to improve patient care. Thank you for seeing me today!

## 2021-06-24 ENCOUNTER — Telehealth: Payer: Self-pay | Admitting: *Deleted

## 2021-06-24 LAB — CBC WITH DIFFERENTIAL/PLATELET
Basophils Absolute: 0 10*3/uL (ref 0.0–0.2)
Basos: 1 %
EOS (ABSOLUTE): 0.1 10*3/uL (ref 0.0–0.4)
Eos: 1 %
Hematocrit: 38.8 % (ref 37.5–51.0)
Hemoglobin: 12.8 g/dL — ABNORMAL LOW (ref 13.0–17.7)
Immature Grans (Abs): 0 10*3/uL (ref 0.0–0.1)
Immature Granulocytes: 0 %
Lymphocytes Absolute: 1.3 10*3/uL (ref 0.7–3.1)
Lymphs: 22 %
MCH: 27.8 pg (ref 26.6–33.0)
MCHC: 33 g/dL (ref 31.5–35.7)
MCV: 84 fL (ref 79–97)
Monocytes Absolute: 0.4 10*3/uL (ref 0.1–0.9)
Monocytes: 7 %
Neutrophils Absolute: 4 10*3/uL (ref 1.4–7.0)
Neutrophils: 69 %
Platelets: 205 10*3/uL (ref 150–450)
RBC: 4.61 x10E6/uL (ref 4.14–5.80)
RDW: 13.5 % (ref 11.6–15.4)
WBC: 5.8 10*3/uL (ref 3.4–10.8)

## 2021-06-24 LAB — COMPREHENSIVE METABOLIC PANEL
ALT: 15 IU/L (ref 0–44)
AST: 16 IU/L (ref 0–40)
Albumin/Globulin Ratio: 1.9 (ref 1.2–2.2)
Albumin: 4.2 g/dL (ref 4.0–5.0)
Alkaline Phosphatase: 39 IU/L — ABNORMAL LOW (ref 44–121)
BUN/Creatinine Ratio: 21 — ABNORMAL HIGH (ref 9–20)
BUN: 22 mg/dL (ref 6–24)
Bilirubin Total: 0.4 mg/dL (ref 0.0–1.2)
CO2: 25 mmol/L (ref 20–29)
Calcium: 9.2 mg/dL (ref 8.7–10.2)
Chloride: 106 mmol/L (ref 96–106)
Creatinine, Ser: 1.04 mg/dL (ref 0.76–1.27)
Globulin, Total: 2.2 g/dL (ref 1.5–4.5)
Glucose: 91 mg/dL (ref 65–99)
Potassium: 4.4 mmol/L (ref 3.5–5.2)
Sodium: 144 mmol/L (ref 134–144)
Total Protein: 6.4 g/dL (ref 6.0–8.5)
eGFR: 89 mL/min/{1.73_m2} (ref >59)

## 2021-06-24 LAB — VITAMIN D 25 HYDROXY (VIT D DEFICIENCY, FRACTURES): Vit D, 25-Hydroxy: 32.9 ng/mL (ref 30.0–100.0)

## 2021-06-24 NOTE — Telephone Encounter (Signed)
Called and spoke w/ pt about results per AL,NP note. Pt has not been taking Vit D supplement on a regular basis. He will try to take this daily, 1000U/day as recommended.

## 2021-06-24 NOTE — Telephone Encounter (Signed)
-----   Message from Shawnie Dapper, NP sent at 06/24/2021  9:14 AM EDT ----- Please let him know that his labs are stable. Vitamin D was low normal. Remind him to take vitamin D supplements 1000iu daily. Otherwise, we will continue current treatment plan.

## 2021-08-06 NOTE — Progress Notes (Signed)
Chart reviewed, agree above plan ?

## 2021-08-12 DIAGNOSIS — H524 Presbyopia: Secondary | ICD-10-CM | POA: Diagnosis not present

## 2021-08-12 DIAGNOSIS — H5203 Hypermetropia, bilateral: Secondary | ICD-10-CM | POA: Diagnosis not present

## 2021-08-12 DIAGNOSIS — E119 Type 2 diabetes mellitus without complications: Secondary | ICD-10-CM | POA: Diagnosis not present

## 2021-10-06 DIAGNOSIS — E7800 Pure hypercholesterolemia, unspecified: Secondary | ICD-10-CM | POA: Diagnosis not present

## 2021-10-06 DIAGNOSIS — R4701 Aphasia: Secondary | ICD-10-CM | POA: Diagnosis not present

## 2021-10-06 DIAGNOSIS — G4733 Obstructive sleep apnea (adult) (pediatric): Secondary | ICD-10-CM | POA: Diagnosis not present

## 2021-10-06 DIAGNOSIS — Z8673 Personal history of transient ischemic attack (TIA), and cerebral infarction without residual deficits: Secondary | ICD-10-CM | POA: Diagnosis not present

## 2021-10-06 DIAGNOSIS — I10 Essential (primary) hypertension: Secondary | ICD-10-CM | POA: Diagnosis not present

## 2021-10-06 DIAGNOSIS — Z23 Encounter for immunization: Secondary | ICD-10-CM | POA: Diagnosis not present

## 2021-10-06 DIAGNOSIS — G629 Polyneuropathy, unspecified: Secondary | ICD-10-CM | POA: Diagnosis not present

## 2021-10-06 DIAGNOSIS — G35 Multiple sclerosis: Secondary | ICD-10-CM | POA: Diagnosis not present

## 2021-10-06 DIAGNOSIS — E1140 Type 2 diabetes mellitus with diabetic neuropathy, unspecified: Secondary | ICD-10-CM | POA: Diagnosis not present

## 2021-10-29 ENCOUNTER — Telehealth: Payer: Self-pay | Admitting: *Deleted

## 2021-10-29 NOTE — Telephone Encounter (Signed)
PA for Vumerity started on covermymeds (key: [id number]). Pt has coverage with [employer] (RF#[id number]). Decision pending.

## 2021-10-29 NOTE — Telephone Encounter (Signed)
PA [id number] approved through 11/22/2022.

## 2021-11-06 ENCOUNTER — Other Ambulatory Visit: Payer: Self-pay | Admitting: Cardiology

## 2021-11-07 NOTE — Telephone Encounter (Signed)
Prescription refill request for Xarelto received.  Indication: Afib  Last office visit:11/29/20 Thomas Nixon)  Weight: 111.9kg Age: 50 Scr: 1.04 (06/23/21)  CrCl: 137.34ml/min  Appropriate dose and refill sent to requested pharmacy.

## 2021-12-14 ENCOUNTER — Other Ambulatory Visit: Payer: Self-pay | Admitting: Cardiology

## 2021-12-29 ENCOUNTER — Encounter: Payer: Self-pay | Admitting: Neurology

## 2021-12-29 ENCOUNTER — Ambulatory Visit: Payer: Medicare PPO | Admitting: Neurology

## 2021-12-29 ENCOUNTER — Other Ambulatory Visit: Payer: Self-pay

## 2021-12-29 VITALS — BP 160/92 | HR 87 | Ht 69.0 in | Wt 246.0 lb

## 2021-12-29 DIAGNOSIS — G35 Multiple sclerosis: Secondary | ICD-10-CM

## 2021-12-29 DIAGNOSIS — E559 Vitamin D deficiency, unspecified: Secondary | ICD-10-CM | POA: Diagnosis not present

## 2021-12-29 DIAGNOSIS — R269 Unspecified abnormalities of gait and mobility: Secondary | ICD-10-CM | POA: Diagnosis not present

## 2021-12-29 NOTE — Progress Notes (Signed)
Chief Complaint  Patient presents with   Follow-up    Rm 15. Alone. PCP is Dr. Georgann Housekeeper. Pt continues on Vumerity. Patient denies any new or worse MS symptoms.     ASSESSMENT AND PLAN Relapsing remitting multiple sclerosis  Stable on Vumerity  Laboratory evaluation including CBC, TSH  Repeat MRI of the brain with without contrast Cognitive impairment  MoCA examination 17 out of 30 today Gait abnormality  Continue moderate exercise    DIAGNOSTIC DATA (LABS, IMAGING, TESTING) - I reviewed patient records, labs, notes, testing and imaging myself where available.  MRI scan of the brain with and without contrast in Sept 2021: showing confluent periventricular and subcortical white matter hyperintensities compatible with chronic demyelinating disease.  No acute enhancing lesions are noted.  The presence of T1 black holes and mild atrophy of corpus callosum and cortex indicates chronic disease.  Overall no significant change compared with previous MRI dated 06/27/2019  Laboratory evaluation August 2022: Vitamin D 26.5    HISTORY OF PRESENT ILLNESS: Thomas Nixon is a 51 y.o. male here today for follow up for RRMS.  Thomas Nixon is followed routinely by Dr Terrace Arabia and Margie Ege, DNP. He has continued Vumerity and tolerating well. MRI 2021 was stable. Labs have been unremarkable with exception of elevated JCV and low vitamin D.   He is doing well. No new or exacerbating symptoms. He feels that gait is stable. No falls. He is sleeping well. He feels mood is good. No changes to bowel or bladder habits. No vision changes.   He continues vitamin D supplements 1000iu daily. He admits that he is not always consistent with taking supplement.   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  Also discussed findings with his wife, he has mild memory loss, MoCA examination is 17 out of 30 today  PHYSICAL EXAM  Vitals:   12/29/21 1328  BP: (!) 160/92  Pulse: 87  Weight: 246 lb (111.6 kg)  Height: 5\' 9"  (1.753 m)   Body mass index is 36.33 kg/m.   Generalized: Well developed, in no acute distress  Cardiology: normal rate and rhythm, no murmur auscultated  Respiratory: clear to auscultation bilaterally    Neurological examination:  Mentation:  awake, alert, following command,  Montreal Cognitive Assessment  12/29/2021  Visuospatial/ Executive (0/5) 2  Naming (0/3) 3  Attention: Read list of digits (0/2) 1  Attention: Read list of letters (0/1) 1  Attention: Serial 7 subtraction starting at 100 (0/3) 1  Language: Repeat phrase (0/2) 2  Language : Fluency (0/1) 1  Abstraction (0/2) 2  Delayed Recall (0/5) 0  Orientation (0/6) 4  Total 17    Cranial nerve II-XII: Pupils were equal round reactive to light. Extraocular movements were full, visual field were full on confrontational test. Facial sensation and strength were normal. Head turning and shoulder shrug  were normal and symmetric.  Motor: The motor testing reveals 5 over 5 strength of  all 4 extremities. Good symmetric motor tone is noted throughout.   Sensory: Sensory testing is intact to soft touch on all 4 extremities. No evidence of extinction is noted.   Coordination: Cerebellar testing reveals good finger-nose-finger and heel-to-shin bilaterally.   Gait and station: Able to push to standing position. Gait is cautious, no  assistive device, mildly spastic, drags right foot. Tandem not attempted  Reflexes: Deep tendon reflexes are symmetric and normal bilaterally.   REVIEW OF SYSTEMS: Out of a complete 14 system review of symptoms, the patient complains only of the following symptoms, fatigue, weakness of right lower extremity and all other reviewed systems are negative.   ALLERGIES: Allergies  Allergen Reactions   Pork-Derived Products Other (See Comments)    DIET RESTRICTIONS     HOME MEDICATIONS: Outpatient Medications Prior to Visit  Medication Sig Dispense Refill   ACCU-CHEK GUIDE test strip      amLODipine (NORVASC) 10 MG tablet Take 10 mg daily by mouth.  3   atorvastatin (LIPITOR) 40 MG tablet Take 1 tablet (40 mg total) by mouth at bedtime. 30 tablet 1   Cholecalciferol (VITAMIN D3 PO) Take 1,000 Units by mouth daily.     hydrochlorothiazide (HYDRODIURIL) 25 MG tablet Take 25 mg daily by mouth.  11   irbesartan (AVAPRO) 300 MG tablet Take 300 mg daily by mouth.  11   losartan (COZAAR) 100 MG tablet Take 100 mg by mouth daily.     LYRICA 300 MG capsule Take 300 mg 2 (two) times daily by mouth.  2   metFORMIN (GLUCOPHAGE) 1000 MG tablet Take 1,000 mg by mouth 2 (two) times daily with a meal.     metoprolol succinate (TOPROL-XL) 50 MG 24 hr tablet TAKE 1 TABLET BY MOUTH DAILY. TAKE WITH OR IMMEDIATELY FOLLOWING A MEAL. 90 tablet 0   traMADol (ULTRAM) 50 MG tablet Take 50 mg 2 (two) times daily by mouth.  3   VUMERITY 231 MG CPDR 432mg  po bid maintenance. 120 capsule 11   XARELTO 20 MG TABS tablet TAKE 1 TABLET BY MOUTH DAILY WITH SUPPER 30 tablet 3   No facility-administered medications prior to visit.     PAST MEDICAL HISTORY: Past Medical History:  Diagnosis Date   CVA (cerebral infarction)    Diabetes mellitus without complication ([place])    Type 2   Encounter for long-term (current) use of other medications    Headache(784.0)    Hypertension    MS (multiple sclerosis) ([place])     possible   Neuropathy    Other and unspecified hyperlipidemia    Stroke ([place])    5 strokes   Thoracic or lumbosacral neuritis or radiculitis, unspecified    Unspecified late effects of cerebrovascular disease    Unspecified sleep apnea    does not use cpap     PAST SURGICAL HISTORY: Past Surgical History:  Procedure Laterality Date   ANTERIOR LAT LUMBAR FUSION N/A 08/30/2014   Procedure: ANTERIOR LATERAL LUMBAR FUSION 1 LEVEL;  Surgeon: Sinclair Ship, MD;  Location: [facility];  Service: Orthopedics;  Laterality: N/A;  Lumbar 3-4 lateral interbody fusion with instrumentation and allograft   BRAIN BIOPSY       FAMILY HISTORY: Family History  Problem Relation Age of Onset   High blood pressure Mother    Diabetes Mother    High blood pressure Father    Stroke Maternal Grandmother    Stroke Paternal Grandmother      SOCIAL HISTORY: Social History  Socioeconomic History   Marital status: Married    Spouse name: Caren Griffins   Number of children: 3   Years of education: college   Highest education level: Not on file  Occupational History    Comment: Disabled  Tobacco Use   Smoking status: Light Smoker    Types: Cigars   Smokeless tobacco: Never  Vaping Use   Vaping Use: Never used  Substance and Sexual Activity   Alcohol use: Yes    Comment: Once per month.   Drug use: No   Sexual activity: Not on file  Other Topics Concern   Not on file  Social History Narrative   Patient is married and lives at home with his wife Caren Griffins .   Disabled.   Education [employer].   Right handed.   1 cup caffeine per day.   Social Determinants of Health   Financial Resource Strain: Not on file  Food Insecurity: Not on file  Transportation Needs: Not on file  Physical Activity: Not on file  Stress: Not on file  Social Connections: Not on file  Intimate Partner Violence: Not on file      Marcial Pacas, M.D. Ph.D.  [facility] [place], [place]  [address] Phone: [phone] Fax:      [phone]

## 2021-12-30 ENCOUNTER — Telehealth: Payer: Self-pay | Admitting: Neurology

## 2021-12-30 ENCOUNTER — Telehealth: Payer: Self-pay | Admitting: *Deleted

## 2021-12-30 LAB — CBC WITH DIFFERENTIAL/PLATELET
Basophils Absolute: 0 10*3/uL (ref 0.0–0.2)
Basos: 0 %
EOS (ABSOLUTE): 0 10*3/uL (ref 0.0–0.4)
Eos: 1 %
Hematocrit: 40.8 % (ref 37.5–51.0)
Hemoglobin: 13.3 g/dL (ref 13.0–17.7)
Immature Grans (Abs): 0 10*3/uL (ref 0.0–0.1)
Immature Granulocytes: 0 %
Lymphocytes Absolute: 1.3 10*3/uL (ref 0.7–3.1)
Lymphs: 23 %
MCH: 27.8 pg (ref 26.6–33.0)
MCHC: 32.6 g/dL (ref 31.5–35.7)
MCV: 85 fL (ref 79–97)
Monocytes Absolute: 0.4 10*3/uL (ref 0.1–0.9)
Monocytes: 8 %
Neutrophils Absolute: 3.6 10*3/uL (ref 1.4–7.0)
Neutrophils: 68 %
Platelets: 190 10*3/uL (ref 150–450)
RBC: 4.78 x10E6/uL (ref 4.14–5.80)
RDW: 13 % (ref 11.6–15.4)
WBC: 5.4 10*3/uL (ref 3.4–10.8)

## 2021-12-30 LAB — TSH: TSH: 2.2 u[IU]/mL (ref 0.450–4.500)

## 2021-12-30 NOTE — Telephone Encounter (Signed)
-----   Message from Levert Feinstein, MD sent at 12/30/2021 10:15 AM EST ----- Please call patient, laboratory evaluation showed no significant abnormalities.

## 2021-12-30 NOTE — Telephone Encounter (Signed)
Called and spoke w/ pt about results per Dr. Terrace Arabia. Pt verbalized understanding.

## 2021-12-30 NOTE — Telephone Encounter (Signed)
[employer] pending uploaded notes on the portal  

## 2021-12-31 NOTE — Telephone Encounter (Signed)
Thomas Nixon: [id number] (exp. 12/30/21 to 01/29/22) order sent to GI,  they will reach out to the patient to schedule.

## 2022-01-20 ENCOUNTER — Telehealth: Payer: Self-pay | Admitting: Neurology

## 2022-01-20 ENCOUNTER — Other Ambulatory Visit: Payer: Self-pay

## 2022-01-20 ENCOUNTER — Ambulatory Visit
Admission: RE | Admit: 2022-01-20 | Discharge: 2022-01-20 | Disposition: A | Discharge status: Home / Self Care | Payer: Medicare PPO | Source: Ambulatory Visit | Attending: Neurology | Admitting: Neurology

## 2022-01-20 DIAGNOSIS — R269 Unspecified abnormalities of gait and mobility: Secondary | ICD-10-CM

## 2022-01-20 DIAGNOSIS — G35 Multiple sclerosis: Secondary | ICD-10-CM | POA: Diagnosis not present

## 2022-01-20 DIAGNOSIS — E559 Vitamin D deficiency, unspecified: Secondary | ICD-10-CM

## 2022-01-20 MED ORDER — GADOBENATE DIMEGLUMINE 529 MG/ML IV SOLN
20.0000 mL | Freq: Once | INTRAVENOUS | Status: AC | PRN
  Administered 2022-01-20: 20 mL via INTRAVENOUS

## 2022-01-20 NOTE — Telephone Encounter (Signed)
I called patient. I discussed his MRI results. He will continue Vumerity. He will call us if he has questions or concerns prior to his November appointment.

## 2022-01-20 NOTE — Telephone Encounter (Signed)
Please call patient, MRI of the brain with without contrast continue demonstrate brain lesions, but there was no significant change compared to previous MRI in September 2021    IMPRESSION: MRI scan of the brain with and without contrast showing scattered brainstem, cerebellum, periatrial, periventricular and subcortical white matter hyperintensities and a patent distribution compatible with chronic demyelinating disease.  No acute or enhancing lesions are noted.  Remote area of encephalomalacia with gliosis and certainly moderate hemorrhagic products in the right frontal periventricular region likely represents an old stroke.  No enhancing lesions are noted.  Overall no significant change compared with previous MRI from 08/03/2020.

## 2022-02-17 DIAGNOSIS — Z03818 Encounter for observation for suspected exposure to other biological agents ruled out: Secondary | ICD-10-CM | POA: Diagnosis not present

## 2022-02-17 DIAGNOSIS — R059 Cough, unspecified: Secondary | ICD-10-CM | POA: Diagnosis not present

## 2022-02-17 DIAGNOSIS — I10 Essential (primary) hypertension: Secondary | ICD-10-CM | POA: Diagnosis not present

## 2022-04-10 DIAGNOSIS — Z125 Encounter for screening for malignant neoplasm of prostate: Secondary | ICD-10-CM | POA: Diagnosis not present

## 2022-04-10 DIAGNOSIS — E1140 Type 2 diabetes mellitus with diabetic neuropathy, unspecified: Secondary | ICD-10-CM | POA: Diagnosis not present

## 2022-04-10 DIAGNOSIS — R4701 Aphasia: Secondary | ICD-10-CM | POA: Diagnosis not present

## 2022-04-10 DIAGNOSIS — I480 Paroxysmal atrial fibrillation: Secondary | ICD-10-CM | POA: Diagnosis not present

## 2022-04-10 DIAGNOSIS — Z23 Encounter for immunization: Secondary | ICD-10-CM | POA: Diagnosis not present

## 2022-04-10 DIAGNOSIS — G894 Chronic pain syndrome: Secondary | ICD-10-CM | POA: Diagnosis not present

## 2022-04-10 DIAGNOSIS — D6869 Other thrombophilia: Secondary | ICD-10-CM | POA: Diagnosis not present

## 2022-04-10 DIAGNOSIS — E7800 Pure hypercholesterolemia, unspecified: Secondary | ICD-10-CM | POA: Diagnosis not present

## 2022-04-10 DIAGNOSIS — I10 Essential (primary) hypertension: Secondary | ICD-10-CM | POA: Diagnosis not present

## 2022-04-10 DIAGNOSIS — Z1331 Encounter for screening for depression: Secondary | ICD-10-CM | POA: Diagnosis not present

## 2022-04-10 DIAGNOSIS — G4733 Obstructive sleep apnea (adult) (pediatric): Secondary | ICD-10-CM | POA: Diagnosis not present

## 2022-04-10 DIAGNOSIS — Z0000 Encounter for general adult medical examination without abnormal findings: Secondary | ICD-10-CM | POA: Diagnosis not present

## 2022-04-10 DIAGNOSIS — G35 Multiple sclerosis: Secondary | ICD-10-CM | POA: Diagnosis not present

## 2022-04-25 DIAGNOSIS — E119 Type 2 diabetes mellitus without complications: Secondary | ICD-10-CM | POA: Diagnosis not present

## 2022-04-27 ENCOUNTER — Other Ambulatory Visit: Payer: Self-pay | Admitting: Cardiology

## 2022-04-27 DIAGNOSIS — I4891 Unspecified atrial fibrillation: Secondary | ICD-10-CM

## 2022-04-27 NOTE — Telephone Encounter (Signed)
Prescription refill request for Xarelto received.  Indication: Afib  Last office visit: 11/29/20 Anne Fu)  Weight: 111.6kg Age: 51 Scr: 1.04 (06/23/21)  CrCl: 137.69ml/min  Office visit overdue. Message sent to schedulers.

## 2022-04-28 NOTE — Telephone Encounter (Signed)
Called and spoke with pt. Thomas Nixon stated he has "not been taking Xarelto for a while". I explained that this medication was prescribed to him by Dr Anne Fu in January 2022 and we received a refill request; however, he is past due for an appt with cardiology. Transferred pt to scheduling to make an appt with Dr Anne Fu.

## 2022-04-29 DIAGNOSIS — E119 Type 2 diabetes mellitus without complications: Secondary | ICD-10-CM | POA: Diagnosis not present

## 2022-04-29 DIAGNOSIS — H40003 Preglaucoma, unspecified, bilateral: Secondary | ICD-10-CM | POA: Diagnosis not present

## 2022-04-29 NOTE — Telephone Encounter (Signed)
Called pt to inquire if he was taking the Xarelto or not, and  after spelling it out and having him go look in his medication supply, he returned to the phone and stated he had it, spelled it out for confirmation. The pt stated he takes it everyday. Advised to continue doing so and reminded him of the Dr. Anne Fu appt tomorrow, which he confirmed and states he will be there at 9am. He was appreciative of the call.  Xarelto 20mg  refill request received. Pt is 51 years old, weight-111.6kg, Crea-1.04 on 06/23/2021, last seen by Dr. 08/23/2021 on 11/29/2020 and pending appt tomorrow, Diagnosis-Afib, CrCl-137.17ml/min; Dose is appropriate based on dosing criteria. Will send in refill to requested pharmacy.

## 2022-04-29 NOTE — Telephone Encounter (Signed)
Called [facility] pharmacy to determine the last time pt pick up Xarelto prescription. Abby at pharmacy stated pt picked up Xarelto prescription on 03/19/22. Since last refill was recent, I attempted to pt back to confirm he was not taking Xarelto like he stated yesterday, no answer. Left message on voicemail.

## 2022-04-30 ENCOUNTER — Ambulatory Visit: Payer: Medicare PPO | Admitting: Cardiology

## 2022-04-30 ENCOUNTER — Encounter: Payer: Self-pay | Admitting: Cardiology

## 2022-04-30 VITALS — BP 120/80 | HR 65 | Ht 69.0 in | Wt 245.0 lb

## 2022-04-30 DIAGNOSIS — G35 Multiple sclerosis: Secondary | ICD-10-CM | POA: Diagnosis not present

## 2022-04-30 DIAGNOSIS — I480 Paroxysmal atrial fibrillation: Secondary | ICD-10-CM

## 2022-04-30 DIAGNOSIS — I10 Essential (primary) hypertension: Secondary | ICD-10-CM

## 2022-04-30 NOTE — Assessment & Plan Note (Signed)
Medications per neurology. 

## 2022-04-30 NOTE — Progress Notes (Signed)
Cardiology Office Note:    Date:  04/30/2022   ID:  Thomas Nixon, DOB [birth year], MRN [id number]  PCP:  Thomas Housekeeper, MD  [facility] Cardiologist:  Thomas Schultz, MD  [facility] Electrophysiologist:  None   Referring MD: Thomas Housekeeper, MD     History of Present Illness:    Thomas Nixon is a 51 y.o. male here for the follow-up of dyspnea on exertion, newly discovered atrial fibrillation.  He was seen last on 11/11/2020.  Shortness of breath when pushing or moving things for instance.  No edema.   Family history-coronary artery disease on his mother side. -Comorbidities include morbid obesity, diabetes, multiple sclerosis, prior MRI with intercerebral hemorrhages/periventricular white matter changes.  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 Chronic demyelinating changes noted on MRI.   Prior echo in 2014-EF 50 to 55%.    Today, he overall appears well and remains complaint with Xarelto and 50 mg of metoprolol daily.   The patient denies chest pain, shortness of breath, nocturnal dyspnea, orthopnea or peripheral edema.  There have been no palpitations, lightheadedness or syncope.    Past Medical History:  Diagnosis Date   CVA (cerebral infarction)    Diabetes mellitus without complication (HCC)    Type 2   Encounter for long-term (current) use of other medications    Headache(784.0)    Hypertension    MS (multiple sclerosis) (HCC)    possible   Neuropathy    Other and unspecified hyperlipidemia    Stroke (HCC)    5 strokes   Thoracic or lumbosacral neuritis or radiculitis, unspecified    Unspecified late effects of cerebrovascular disease    Unspecified sleep apnea    does not use cpap    Past Surgical History:  Procedure Laterality Date   ANTERIOR LAT LUMBAR FUSION N/A 08/30/2014   Procedure: ANTERIOR LATERAL LUMBAR FUSION 1 LEVEL;  Surgeon: Emilee Hero, MD;  Location: [facility] OR;  Service: Orthopedics;  Laterality: N/A;  Lumbar 3-4 lateral interbody fusion with instrumentation and allograft   BRAIN BIOPSY      Current Medications: Current Meds  Medication Sig   ACCU-CHEK GUIDE test strip    amLODipine (NORVASC) 10 MG tablet Take 10 mg daily by mouth.   atorvastatin (LIPITOR) 40 MG tablet Take 1 tablet (40 mg total) by mouth at bedtime.   Cholecalciferol (VITAMIN D3 PO) Take 1,000 Units by mouth daily.   losartan (COZAAR) 100 MG tablet Take 100 mg by mouth daily.   LYRICA 300 MG capsule Take 300 mg 2 (two) times daily by mouth.   metFORMIN (GLUCOPHAGE) 1000 MG tablet Take 1,000 mg by mouth 2 (two) times daily with a meal.   metoprolol succinate (TOPROL-XL) 50 MG 24 hr tablet TAKE 1 TABLET BY MOUTH DAILY. TAKE WITH OR IMMEDIATELY FOLLOWING A MEAL.   rivaroxaban (XARELTO) 20 MG TABS tablet TAKE 1 TABLET BY MOUTH DAILY WITH SUPPER   traMADol (ULTRAM) 50 MG tablet Take 50 mg 2 (two) times daily by mouth.   VUMERITY 231 MG CPDR 432mg  po bid maintenance.     Allergies:   Pork-derived products   Social History   Socioeconomic History   Marital status: Married    Spouse name: Thomas Nixon   Number of children: 3   Years of education: college   Highest education level: Not on file  Occupational History    Comment: Disabled  Tobacco Use  Smoking status: Light Smoker    Types: Cigars   Smokeless tobacco: Never  Vaping Use   Vaping Use: Never used  Substance and Sexual Activity   Alcohol use: Yes    Comment: Once per month.   Drug use: No   Sexual activity: Not on file  Other Topics Concern   Not on file  Social History Narrative   Patient is married and lives at home with his wife Thomas Nixon .   Disabled.   Education [employer].   Right handed.   1 cup caffeine per day.   Social Determinants of Health   Financial Resource Strain: Not on file  Food Insecurity: Not on file  Transportation Needs: Not on file  Physical Activity:  Not on file  Stress: Not on file  Social Connections: Not on file     Family History: The patient's family history includes Diabetes in his mother; High blood pressure in his father and mother; Stroke in his maternal grandmother and paternal grandmother.  ROS:   Please see the history of present illness.    All other systems reviewed and are negative.  EKGs/Labs/Other Studies Reviewed:    The following studies were reviewed today:  Echo 12/06/2020:  IMPRESSIONS    1. Left ventricular ejection fraction, by estimation, is 45 to 50%. Left  ventricular ejection fraction by PLAX is 45 %. The left ventricle has  mildly decreased function. The left ventricle demonstrates global  hypokinesis. The left ventricular internal  cavity size was moderately dilated. There is mild concentric left  ventricular hypertrophy. Left ventricular diastolic parameters are  consistent with Grade II diastolic dysfunction (pseudonormalization).   2. Right ventricular systolic function is normal. The right ventricular  size is normal. There is mildly elevated pulmonary artery systolic pressure.   3. Left atrial size was mildly dilated.   4. The mitral valve is normal in structure. Trivial mitral valve  regurgitation. No evidence of mitral stenosis.   5. The aortic valve is tricuspid. Aortic valve regurgitation is not  visualized. No aortic stenosis is present.   6. The inferior vena cava is normal in size with greater than 50%  respiratory variability, suggesting right atrial pressure of 3 mmHg.    EKG:  EKG is personally reviewed.  04/30/2022 EKG: Rate 65. Sinus Rhythm. PACs with nonspecific T-wave changes.   11/29/2020 EKG: The ekg ordered today demonstrates sinus rhythm 70 nonspecific ST-T wave changes.   Recent Labs: 06/23/2021: ALT 15; BUN 22; Creatinine, Ser 1.04; Potassium 4.4; Sodium 144 12/29/2021: Hemoglobin 13.3; Platelets 190; TSH 2.200  Recent Lipid Panel    Component Value Date/Time   CHOL  142 06/02/2013 0640   TRIG 81 06/02/2013 0640   HDL 34 (L) 06/02/2013 0640   CHOLHDL 4.2 06/02/2013 0640   VLDL 16 06/02/2013 0640   LDLCALC 92 06/02/2013 0640     Risk Assessment/Calculations:     CHA2DS2-VASc Score =    This indicates a  % annual risk of stroke. The patient's score is based upon:        Physical Exam:    VS:  BP 120/80 (BP Location: Left Arm, Patient Position: Sitting, Cuff Size: Normal)   Pulse 65   Ht 5\' 9"  (1.753 m)   Wt 245 lb (111.1 kg)   SpO2 97%   BMI 36.18 kg/m     Wt Readings from Last 3 Encounters:  04/30/22 245 lb (111.1 kg)  12/29/21 246 lb (111.6 kg)  06/23/21 246 lb 12.8 oz (111.9  kg)     GEN:  Well nourished, well developed in no acute distress HEENT: Normal NECK: No JVD; No carotid bruits LYMPHATICS: No lymphadenopathy CARDIAC: RRR, no murmurs, rubs, gallops RESPIRATORY:  Clear to auscultation without rales, wheezing or rhonchi  ABDOMEN: Soft, non-tender, non-distended MUSCULOSKELETAL:  No edema; No deformity  SKIN: Warm and dry NEUROLOGIC:  Alert and oriented x 3 PSYCHIATRIC:  Normal affect   ASSESSMENT:    1. PAF (paroxysmal atrial fibrillation) (HCC)   2. Primary hypertension   3. Paroxysmal atrial fibrillation (HCC)   4. Multiple sclerosis (HCC)     PLAN:    In order of problems listed above:  Paroxysmal atrial fibrillation (HCC) - In December 2021 we started Toprol-XL 50 mg once a day secondary to heart rate of approximately 99 bpm. - We also started Xarelto 20 mg a day given CHA2DS2-VASc of 2 for diabetes and hypertension. - We stopped his Plavix 75 mg a day previously -He did not require cardioversion.  He spontaneously/auto converted. -Ejection fraction 50%, mildly reduced.  Multiple sclerosis Medications per neurology.  Hypertension Medications reviewed including amlodipine 10 mg, losartan 100 mg, Toprol 50 mg.  He is doing well with this.    1 year follow-up   Medication Adjustments/Labs and Tests  Ordered: Current medicines are reviewed at length with the patient today.  Concerns regarding medicines are outlined above.  Orders Placed This Encounter  Procedures   EKG 12-Lead   No orders of the defined types were placed in this encounter.   Patient Instructions  Medication Instructions:  The current medical regimen is effective;  continue present plan and medications.  *If you need a refill on your cardiac medications before your next appointment, please call your pharmacy*  Follow-Up: At [facility], you and your health needs are our priority.  As part of our continuing mission to provide you with exceptional heart care, we have created designated Provider Care Teams.  These Care Teams include your primary Cardiologist (physician) and Advanced Practice Providers (APPs -  Physician Assistants and Nurse Practitioners) who all work together to provide you with the care you need, when you need it.  We recommend signing up for the patient portal called "MyChart".  Sign up information is provided on this After Visit Summary.  MyChart is used to connect with patients for Virtual Visits (Telemedicine).  Patients are able to view lab/test results, encounter notes, upcoming appointments, etc.  Non-urgent messages can be sent to your provider as well.   To learn more about what you can do with MyChart, go to [email or web address].    Your next appointment:   1 year(s)  The format for your next appointment:   In Person  Provider:   Donato Schultz, MD {   Important Information About Sugar          I,Tinashe Williams,acting as a scribe for Thomas Schultz, MD.,have documented all relevant documentation on the behalf of Thomas Schultz, MD,as directed by  Thomas Schultz, MD while in the presence of Thomas Schultz, MD.   I, Thomas Schultz, MD, have reviewed all documentation for this visit. The documentation on 04/30/22 for the exam, diagnosis, procedures, and orders are all accurate and complete.

## 2022-04-30 NOTE — Patient Instructions (Signed)

## 2022-04-30 NOTE — Assessment & Plan Note (Signed)
Medications reviewed including amlodipine 10 mg, losartan 100 mg, Toprol 50 mg.  He is doing well with this.

## 2022-04-30 NOTE — Assessment & Plan Note (Signed)
-   In December 2021 we started Toprol-XL 50 mg once a day secondary to heart rate of approximately 99 bpm. - We also started Xarelto 20 mg a day given CHA2DS2-VASc of 2 for diabetes and hypertension. - We stopped his Plavix 75 mg a day previously -He did not require cardioversion.  He spontaneously/auto converted. -Ejection fraction 50%, mildly reduced.

## 2022-06-07 ENCOUNTER — Other Ambulatory Visit: Payer: Self-pay | Admitting: Cardiology

## 2022-07-22 DIAGNOSIS — Z0271 Encounter for disability determination: Secondary | ICD-10-CM

## 2022-08-12 DIAGNOSIS — H5203 Hypermetropia, bilateral: Secondary | ICD-10-CM | POA: Diagnosis not present

## 2022-08-12 DIAGNOSIS — E119 Type 2 diabetes mellitus without complications: Secondary | ICD-10-CM | POA: Diagnosis not present

## 2022-09-29 NOTE — Progress Notes (Deleted)
Patient: Thomas Nixon Date of Birth: 1971-09-18  Reason for Visit: Follow up History from: Patient Primary Neurologist: Dr, Terrace Arabia   ASSESSMENT AND PLAN 51 y.o. year old male    HISTORY  Thomas Nixon is a 51 y.o. male here today for follow up for RRMS.  [name] is followed routinely by Dr Terrace Arabia and Margie Ege, DNP. He has continued Vumerity and tolerating well. MRI 2021 was stable. Labs have been unremarkable with exception of elevated JCV and low vitamin D.    He is doing well. No new or exacerbating symptoms. He feels that gait is stable. No falls. He is sleeping well. He feels mood is good. No changes to bowel or bladder habits. No vision changes.    He continues vitamin D supplements 1000iu daily. He admits that he is not always consistent with taking supplement.    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   Also discussed findings with his wife, he has mild memory loss, MoCA examination is 76 out of 30 today  Update September 30, 2022 SS:   REVIEW OF SYSTEMS: Out of a complete 14 system review of symptoms, the patient complains only of the following symptoms, and all other reviewed systems are negative.  See HPI  ALLERGIES: Allergies  Allergen Reactions   Pork-Derived Products Other (See Comments)    DIET RESTRICTIONS    HOME MEDICATIONS: Outpatient Medications Prior to Visit   Medication Sig Dispense Refill   ACCU-CHEK GUIDE test strip      amLODipine (NORVASC) 10 MG tablet Take 10 mg daily by mouth.  3   atorvastatin (LIPITOR) 40 MG tablet Take 1 tablet (40 mg total) by mouth at bedtime. 30 tablet 1   Cholecalciferol (VITAMIN D3 PO) Take 1,000 Units by mouth daily.     losartan (COZAAR) 100 MG tablet Take 100 mg by mouth daily.     LYRICA 300 MG capsule Take 300 mg 2 (two) times daily by mouth.  2   metFORMIN (GLUCOPHAGE) 1000 MG tablet Take 1,000 mg by mouth 2 (two) times daily with a meal.     metoprolol succinate (TOPROL-XL) 50 MG 24 hr tablet TAKE 1 TABLET BY MOUTH EVERY DAY WITH OR IMMEDIATELY FOLLOWING A MEAL 90 tablet 3   rivaroxaban (XARELTO) 20 MG TABS tablet TAKE 1 TABLET BY MOUTH DAILY WITH SUPPER 30 tablet 5   traMADol (ULTRAM) 50 MG tablet Take 50 mg 2 (two) times daily by mouth.  3   VUMERITY 231 MG CPDR 432mg  po bid maintenance. 120 capsule 11   No facility-administered medications prior to visit.    PAST MEDICAL HISTORY: Past Medical History:  Diagnosis Date   CVA (cerebral infarction)    Diabetes mellitus without complication (HCC)    Type 2   Encounter for long-term (current) use of other medications    Headache(784.0)    Hypertension    MS (multiple sclerosis) (HCC)    possible   Neuropathy  Other and unspecified hyperlipidemia    Stroke (HCC)    5 strokes   Thoracic or lumbosacral neuritis or radiculitis, unspecified    Unspecified late effects of cerebrovascular disease    Unspecified sleep apnea    does not use cpap    PAST SURGICAL HISTORY: Past Surgical History:  Procedure Laterality Date   ANTERIOR LAT LUMBAR FUSION N/A 08/30/2014   Procedure: ANTERIOR LATERAL LUMBAR FUSION 1 LEVEL;  Surgeon: Emilee Hero, MD;  Location: [facility] OR;  Service: Orthopedics;  Laterality: N/A;  Lumbar 3-4 lateral interbody fusion with instrumentation and allograft   BRAIN BIOPSY      FAMILY HISTORY: Family History  Problem  Relation Age of Onset   High blood pressure Mother    Diabetes Mother    High blood pressure Father    Stroke Maternal Grandmother    Stroke Paternal Grandmother     SOCIAL HISTORY: Social History   Socioeconomic History   Marital status: Married    Spouse name: [name]   Number of children: 3   Years of education: college   Highest education level: Not on file  Occupational History    Comment: Disabled  Tobacco Use   Smoking status: Light Smoker    Types: Cigars   Smokeless tobacco: Never  Vaping Use   Vaping Use: Never used  Substance and Sexual Activity   Alcohol use: Yes    Comment: Once per month.   Drug use: No   Sexual activity: Not on file  Other Topics Concern   Not on file  Social History Narrative   Patient is married and lives at home with his wife [name] .   Disabled.   Education [employer].   Right handed.   1 cup caffeine per day.   Social Determinants of Health   Financial Resource Strain: Not on file  Food Insecurity: Not on file  Transportation Needs: Not on file  Physical Activity: Not on file  Stress: Not on file  Social Connections: Not on file  Intimate Partner Violence: Not on file    PHYSICAL EXAM  There were no vitals filed for this visit. There is no height or weight on file to calculate BMI.  Generalized: Well developed, in no acute distress  Neurological examination  Mentation: Alert oriented to time, place, history taking. Follows all commands speech and language fluent Cranial nerve II-XII: Pupils were equal round reactive to light. Extraocular movements were full, visual field were full on confrontational test. Facial sensation and strength were normal. Uvula tongue midline. Head turning and shoulder shrug  were normal and symmetric. Motor: The motor testing reveals 5 over 5 strength of all 4 extremities. Good symmetric motor tone is noted throughout.  Sensory: Sensory testing is intact to soft touch on all 4 extremities. No  evidence of extinction is noted.  Coordination: Cerebellar testing reveals good finger-nose-finger and heel-to-shin bilaterally.  Gait and station: Gait is normal. Tandem gait is normal. Romberg is negative. No drift is seen.  Reflexes: Deep tendon reflexes are symmetric and normal bilaterally.   DIAGNOSTIC DATA (LABS, IMAGING, TESTING) - I reviewed patient records, labs, notes, testing and imaging myself where available.  Lab Results  Component Value Date   WBC 5.4 12/29/2021   HGB 13.3 12/29/2021   HCT 40.8 12/29/2021   MCV 85 12/29/2021   PLT 190 12/29/2021      Component Value Date/Time   NA 144 06/23/2021 1429   K 4.4 06/23/2021 1429   CL 106  06/23/2021 1429   CO2 25 06/23/2021 1429   GLUCOSE 91 06/23/2021 1429   GLUCOSE 102 (H) 08/22/2014 0905   BUN 22 06/23/2021 1429   CREATININE 1.04 06/23/2021 1429   CALCIUM 9.2 06/23/2021 1429   PROT 6.4 06/23/2021 1429   ALBUMIN 4.2 06/23/2021 1429   ALBUMIN 4.3 01/06/2018 0856   AST 16 06/23/2021 1429   ALT 15 06/23/2021 1429   ALKPHOS 39 (L) 06/23/2021 1429   BILITOT 0.4 06/23/2021 1429   GFRNONAA 93 12/23/2020 0842   GFRAA 107 12/23/2020 0842   Lab Results  Component Value Date   CHOL 142 06/02/2013   HDL 34 (L) 06/02/2013   LDLCALC 92 06/02/2013   TRIG 81 06/02/2013   CHOLHDL 4.2 06/02/2013   Lab Results  Component Value Date   HGBA1C 7.0 (H) 06/11/2020   Lab Results  Component Value Date   OLIDCVUD31 438 06/11/2020   Lab Results  Component Value Date   TSH 2.200 12/29/2021    Butler Denmark, AGNP-C, DNP 09/29/2022, 2:24 PM [facility] [address], [place], [place] [address] [phone]

## 2022-09-30 ENCOUNTER — Ambulatory Visit: Payer: Medicare PPO | Admitting: Neurology

## 2022-09-30 ENCOUNTER — Encounter: Payer: Self-pay | Admitting: Neurology

## 2022-10-21 ENCOUNTER — Encounter: Payer: Self-pay | Admitting: Neurology

## 2022-10-21 ENCOUNTER — Ambulatory Visit: Payer: Medicare PPO | Admitting: Neurology

## 2022-10-21 NOTE — Progress Notes (Deleted)
Patient: Thomas Nixon Date of Birth: Feb 27, 1971  Reason for Visit: Follow up History from: Patient Primary Neurologist: Dr. Terrace Arabia   ASSESSMENT AND PLAN 51 y.o. year old male   1.  Relapsing remitting multiple sclerosis -MRI of the brain with and without contrast in February 2023 continues to show scattered MS lesions, no significant change from MRI in September 2021   HISTORY  Thomas Nixon is a 51 y.o. male here today for follow up for RRMS.  [name] is followed routinely by Dr Terrace Arabia and Margie Ege, DNP. He has continued Vumerity and tolerating well. MRI 2021 was stable. Labs have been unremarkable with exception of elevated JCV and low vitamin D.    He is doing well. No new or exacerbating symptoms. He feels that gait is stable. No falls. He is sleeping well. He feels mood is good. No changes to bowel or bladder habits. No vision changes.    He continues vitamin D supplements 1000iu daily. He admits that he is not always consistent with taking supplement.    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   Also discussed findings with his wife, he has mild memory loss, MoCA examination is 12 out of 30 today  Update October 21, 2022 SS:   Labs 12/29/2021 normal CBC, TSH 2.200  REVIEW OF SYSTEMS: Out of a complete 14 system review of symptoms, the patient complains only of the following symptoms, and  all other reviewed systems are negative.  See HPI  ALLERGIES: Allergies  Allergen Reactions   Pork-Derived Products Other (See Comments)    DIET RESTRICTIONS    HOME MEDICATIONS: Outpatient Medications Prior to Visit  Medication Sig Dispense Refill   ACCU-CHEK GUIDE test strip      amLODipine (NORVASC) 10 MG tablet Take 10 mg daily by mouth.  3   atorvastatin (LIPITOR) 40 MG tablet Take 1 tablet (40 mg total) by mouth at bedtime. 30 tablet 1   Cholecalciferol (VITAMIN D3 PO) Take 1,000 Units by mouth daily.     losartan (COZAAR) 100 MG tablet Take 100 mg by mouth daily.     LYRICA 300 MG capsule Take 300 mg 2 (two) times daily by mouth.  2   metFORMIN (GLUCOPHAGE) 1000 MG tablet Take 1,000 mg by mouth 2 (two) times daily with a meal.     metoprolol succinate (TOPROL-XL) 50 MG 24 hr tablet TAKE 1 TABLET BY MOUTH EVERY DAY WITH OR IMMEDIATELY FOLLOWING A MEAL 90 tablet 3   rivaroxaban (XARELTO) 20 MG TABS tablet TAKE 1 TABLET BY MOUTH DAILY WITH SUPPER 30 tablet 5   traMADol (ULTRAM) 50 MG tablet Take 50 mg 2 (two) times daily by mouth.  3   VUMERITY 231 MG CPDR 432mg  po bid maintenance. 120 capsule 11   No facility-administered medications prior to visit.    PAST MEDICAL HISTORY: Past Medical History:  Diagnosis Date   CVA (cerebral infarction)    Diabetes mellitus without  complication (HCC)    Type 2   Encounter for long-term (current) use of other medications    Headache(784.0)    Hypertension    MS (multiple sclerosis) (HCC)    possible   Neuropathy    Other and unspecified hyperlipidemia    Stroke (HCC)    5 strokes   Thoracic or lumbosacral neuritis or radiculitis, unspecified    Unspecified late effects of cerebrovascular disease    Unspecified sleep apnea    does not use cpap    PAST SURGICAL HISTORY: Past Surgical History:  Procedure Laterality Date   ANTERIOR LAT LUMBAR FUSION N/A 08/30/2014   Procedure: ANTERIOR LATERAL LUMBAR FUSION 1 LEVEL;  Surgeon:  Emilee Hero, MD;  Location: [facility] OR;  Service: Orthopedics;  Laterality: N/A;  Lumbar 3-4 lateral interbody fusion with instrumentation and allograft   BRAIN BIOPSY      FAMILY HISTORY: Family History  Problem Relation Age of Onset   High blood pressure Mother    Diabetes Mother    High blood pressure Father    Stroke Maternal Grandmother    Stroke Paternal Grandmother     SOCIAL HISTORY: Social History   Socioeconomic History   Marital status: Married    Spouse name: [name]   Number of children: 3   Years of education: college   Highest education level: Not on file  Occupational History    Comment: Disabled  Tobacco Use   Smoking status: Light Smoker    Types: Cigars   Smokeless tobacco: Never  Vaping Use   Vaping Use: Never used  Substance and Sexual Activity   Alcohol use: Yes    Comment: Once per month.   Drug use: No   Sexual activity: Not on file  Other Topics Concern   Not on file  Social History Narrative   Patient is married and lives at home with his wife [name] .   Disabled.   Education [employer].   Right handed.   1 cup caffeine per day.   Social Determinants of Health   Financial Resource Strain: Not on file  Food Insecurity: Not on file  Transportation Needs: Not on file  Physical Activity: Not on file  Stress: Not on file  Social Connections: Not on file  Intimate Partner Violence: Not on file    PHYSICAL EXAM  There were no vitals filed for this visit. There is no height or weight on file to calculate BMI.  Generalized: Well developed, in no acute distress  Neurological examination  Mentation: Alert oriented to time, place, history taking. Follows all commands speech and language fluent Cranial nerve II-XII: Pupils were equal round reactive to light. Extraocular movements were full, visual field were full on confrontational test. Facial sensation and strength were normal. Uvula tongue midline. Head turning and shoulder shrug   were normal and symmetric. Motor: The motor testing reveals 5 over 5 strength of all 4 extremities. Good symmetric motor tone is noted throughout.  Sensory: Sensory testing is intact to soft touch on all 4 extremities. No evidence of extinction is noted.  Coordination: Cerebellar testing reveals good finger-nose-finger and heel-to-shin bilaterally.  Gait and station: Gait is normal. Tandem gait is normal. Romberg is negative. No drift is seen.  Reflexes: Deep tendon reflexes are symmetric and normal bilaterally.   DIAGNOSTIC DATA (LABS, IMAGING, TESTING) - I reviewed patient records, labs, notes, testing and imaging myself where available.  Lab Results  Component Value Date   WBC 5.4 12/29/2021  HGB 13.3 12/29/2021   HCT 40.8 12/29/2021   MCV 85 12/29/2021   PLT 190 12/29/2021      Component Value Date/Time   NA 144 06/23/2021 1429   K 4.4 06/23/2021 1429   CL 106 06/23/2021 1429   CO2 25 06/23/2021 1429   GLUCOSE 91 06/23/2021 1429   GLUCOSE 102 (H) 08/22/2014 0905   BUN 22 06/23/2021 1429   CREATININE 1.04 06/23/2021 1429   CALCIUM 9.2 06/23/2021 1429   PROT 6.4 06/23/2021 1429   ALBUMIN 4.2 06/23/2021 1429   ALBUMIN 4.3 01/06/2018 0856   AST 16 06/23/2021 1429   ALT 15 06/23/2021 1429   ALKPHOS 39 (L) 06/23/2021 1429   BILITOT 0.4 06/23/2021 1429   GFRNONAA 93 12/23/2020 0842   GFRAA 107 12/23/2020 0842   Lab Results  Component Value Date   CHOL 142 06/02/2013   HDL 34 (L) 06/02/2013   LDLCALC 92 06/02/2013   TRIG 81 06/02/2013   CHOLHDL 4.2 06/02/2013   Lab Results  Component Value Date   HGBA1C 7.0 (H) 06/11/2020   Lab Results  Component Value Date   VITAMINB12 414 06/11/2020   Lab Results  Component Value Date   TSH 2.200 12/29/2021    Margie Ege, AGNP-C, DNP 10/21/2022, 5:34 AM [facility] [address], [address] [place], [place] [address] [phone]

## 2022-10-23 DIAGNOSIS — Z7984 Long term (current) use of oral hypoglycemic drugs: Secondary | ICD-10-CM | POA: Diagnosis not present

## 2022-10-23 DIAGNOSIS — E785 Hyperlipidemia, unspecified: Secondary | ICD-10-CM | POA: Diagnosis not present

## 2022-10-23 DIAGNOSIS — G4733 Obstructive sleep apnea (adult) (pediatric): Secondary | ICD-10-CM | POA: Diagnosis not present

## 2022-10-23 DIAGNOSIS — R4701 Aphasia: Secondary | ICD-10-CM | POA: Diagnosis not present

## 2022-10-23 DIAGNOSIS — I10 Essential (primary) hypertension: Secondary | ICD-10-CM | POA: Diagnosis not present

## 2022-10-23 DIAGNOSIS — D6869 Other thrombophilia: Secondary | ICD-10-CM | POA: Diagnosis not present

## 2022-10-23 DIAGNOSIS — E1140 Type 2 diabetes mellitus with diabetic neuropathy, unspecified: Secondary | ICD-10-CM | POA: Diagnosis not present

## 2022-10-23 DIAGNOSIS — Z23 Encounter for immunization: Secondary | ICD-10-CM | POA: Diagnosis not present

## 2022-10-23 DIAGNOSIS — G35 Multiple sclerosis: Secondary | ICD-10-CM | POA: Diagnosis not present

## 2022-12-09 ENCOUNTER — Telehealth: Payer: Self-pay

## 2022-12-09 NOTE — Patient Outreach (Signed)
  Care Coordination   12/09/2022 Name: Thomas Nixon MRN: [id number] DOB: [birth year]   Care Coordination Outreach Attempts:  An unsuccessful telephone outreach was attempted today to offer the patient information about available care coordination services as a benefit of their health plan.   Follow Up Plan:  Additional outreach attempts will be made to offer the patient care coordination information and services.   Encounter Outcome:  No Answer   Care Coordination Interventions:  No, not indicated    Peter Garter RN, BSN,CCM, CDE Care Management Coordinator [facility] [phone]

## 2022-12-16 ENCOUNTER — Telehealth: Payer: Self-pay

## 2022-12-16 NOTE — Patient Outreach (Signed)
  Care Coordination   12/16/2022 Name: Thomas Nixon MRN: [id number] DOB: 09/05/1971   Care Coordination Outreach Attempts:  A second unsuccessful outreach was attempted today to offer the patient with information about available care coordination services as a benefit of their health plan.     Follow Up Plan:  Additional outreach attempts will be made to offer the patient care coordination information and services.   Encounter Outcome:  No Answer   Care Coordination Interventions:  No, not indicated    SIG Peter Garter RN, BSN,CCM, CDE Care Management Coordinator [facility] [facility] [phone]

## 2022-12-23 ENCOUNTER — Telehealth: Payer: Self-pay

## 2022-12-23 NOTE — Patient Outreach (Signed)
  Care Coordination   Initial Visit Note   12/23/2022 Name: NASON CONRADT MRN: [id number] DOB: 22-Jun-1971  DRESHAUN STENE is a 52 y.o. year old male who sees Wenda Low, MD for primary care. I spoke with  Allegra Lai by phone today.  What matters to the patients health and wellness today?  I am doing good and no concerns today States CBG range 115-120.  States he has follow ups with neurology regularly    Goals Addressed             This Visit's Progress    COMPLETED: Care Coordination Activities - no follow up required       Care Coordination Interventions: Evaluation of current treatment plan related to MS, Diabetes and patient's adherence to plan as established by provider Provided education to patient re: care coordination services, annual wellness visit Assessed social determinant of health barriers Declines need for further RNCM follow up          SDOH assessments and interventions completed:  Yes  SDOH Interventions Today    Flowsheet Row Most Recent Value  SDOH Interventions   Food Insecurity Interventions Intervention Not Indicated  Housing Interventions Intervention Not Indicated  Transportation Interventions Intervention Not Indicated  Utilities Interventions Intervention Not Indicated        Care Coordination Interventions:  Yes, provided   Follow up plan: No further intervention required.   Encounter Outcome:  Pt. Visit Completed  Peter Garter RN, BSN,CCM, CDE Care Management Coordinator [facility] [phone]

## 2022-12-23 NOTE — Patient Instructions (Signed)
Visit Information  Thank you for taking time to visit with me today. Please don't hesitate to contact me if I can be of assistance to you.   Following are the goals we discussed today:   Goals Addressed             This Visit's Progress    COMPLETED: Care Coordination Activities - no follow up required       Care Coordination Interventions: Evaluation of current treatment plan related to MS, Diabetes and patient's adherence to plan as established by provider Provided education to patient re: care coordination services, annual wellness visit Assessed social determinant of health barriers Declines need for further RNCM follow up          If you are experiencing a Mental Health or [place] or need someone to talk to, please call the Suicide and Crisis Lifeline: 988 call the [place] National Suicide Prevention Lifeline: 250-400-4454 or TTY: 838-690-8221 TTY 878-599-3326) to talk to a trained counselor call 1-800-273-TALK (toll free, 24 hour hotline) go to [facility] [place] [phone]) call 911   Patient verbalizes understanding of instructions and care plan provided today and agrees to view in [facility]. Active MyChart status and patient understanding of how to access instructions and care plan via MyChart confirmed with patient.     No further follow up required:    Peter Garter RN, Jackquline Denmark, [facility] [phone]

## 2022-12-30 ENCOUNTER — Other Ambulatory Visit: Payer: Self-pay | Admitting: Cardiology

## 2022-12-30 DIAGNOSIS — I4891 Unspecified atrial fibrillation: Secondary | ICD-10-CM

## 2022-12-30 NOTE — Telephone Encounter (Signed)
Prescription refill request for Xarelto received.  Indication: Afib  Last office visit: 04/30/22 Marlou Porch)  Weight: 111.1kg Age: 52 Scr: 1.11 (04/10/22 vis [facility])  CrCl: 126.72ml/min  Appropriate dose. Refill sent.

## 2023-01-04 DIAGNOSIS — J069 Acute upper respiratory infection, unspecified: Secondary | ICD-10-CM | POA: Diagnosis not present

## 2023-01-04 DIAGNOSIS — G4733 Obstructive sleep apnea (adult) (pediatric): Secondary | ICD-10-CM | POA: Diagnosis not present

## 2023-01-04 DIAGNOSIS — R0681 Apnea, not elsewhere classified: Secondary | ICD-10-CM | POA: Diagnosis not present

## 2023-03-22 DIAGNOSIS — I10 Essential (primary) hypertension: Secondary | ICD-10-CM | POA: Diagnosis not present

## 2023-03-22 DIAGNOSIS — G4733 Obstructive sleep apnea (adult) (pediatric): Secondary | ICD-10-CM | POA: Diagnosis not present

## 2023-03-22 DIAGNOSIS — G35 Multiple sclerosis: Secondary | ICD-10-CM | POA: Diagnosis not present

## 2023-03-22 DIAGNOSIS — E669 Obesity, unspecified: Secondary | ICD-10-CM | POA: Diagnosis not present

## 2023-04-08 ENCOUNTER — Other Ambulatory Visit: Payer: Self-pay

## 2023-04-08 ENCOUNTER — Telehealth: Payer: Self-pay

## 2023-04-08 NOTE — Telephone Encounter (Signed)
Received fax that patient has been approved for vumerity but we have not seen patient since February 2023 and next appointment is August. Patient has no showed last two appointments. Please advise on next steps

## 2023-04-12 DIAGNOSIS — E1142 Type 2 diabetes mellitus with diabetic polyneuropathy: Secondary | ICD-10-CM | POA: Diagnosis not present

## 2023-04-12 DIAGNOSIS — Z125 Encounter for screening for malignant neoplasm of prostate: Secondary | ICD-10-CM | POA: Diagnosis not present

## 2023-04-12 DIAGNOSIS — G894 Chronic pain syndrome: Secondary | ICD-10-CM | POA: Diagnosis not present

## 2023-04-12 DIAGNOSIS — E785 Hyperlipidemia, unspecified: Secondary | ICD-10-CM | POA: Diagnosis not present

## 2023-04-12 DIAGNOSIS — D6869 Other thrombophilia: Secondary | ICD-10-CM | POA: Diagnosis not present

## 2023-04-12 DIAGNOSIS — I10 Essential (primary) hypertension: Secondary | ICD-10-CM | POA: Diagnosis not present

## 2023-04-12 DIAGNOSIS — E1140 Type 2 diabetes mellitus with diabetic neuropathy, unspecified: Secondary | ICD-10-CM | POA: Diagnosis not present

## 2023-04-12 DIAGNOSIS — I480 Paroxysmal atrial fibrillation: Secondary | ICD-10-CM | POA: Diagnosis not present

## 2023-04-12 DIAGNOSIS — G35 Multiple sclerosis: Secondary | ICD-10-CM | POA: Diagnosis not present

## 2023-04-12 DIAGNOSIS — Z0000 Encounter for general adult medical examination without abnormal findings: Secondary | ICD-10-CM | POA: Diagnosis not present

## 2023-04-12 DIAGNOSIS — Z1331 Encounter for screening for depression: Secondary | ICD-10-CM | POA: Diagnosis not present

## 2023-04-12 NOTE — Telephone Encounter (Signed)
On schedule with Maralyn Sago on June 20, keep that appointment, if 1 more no-show, will discharge from clinic

## 2023-04-13 ENCOUNTER — Other Ambulatory Visit: Payer: Self-pay

## 2023-04-13 ENCOUNTER — Telehealth: Payer: Self-pay

## 2023-04-13 MED ORDER — VUMERITY 231 MG PO CPDR: DELAYED_RELEASE_CAPSULE | ORAL | 11 refills | Status: DC

## 2023-04-13 NOTE — Progress Notes (Signed)
Meds ordered this encounter  Medications   VUMERITY 231 MG CPDR    Sig: 432mg  po bid maintenance.    Dispense:  120 capsule    Refill:  11    Free Drug Pt Assistance program [phone], [phone]

## 2023-04-13 NOTE — Telephone Encounter (Signed)
Call to patient, he states that he is still taking the vulmerity 432mg  twice daily. He states he does not have enough to get to appointment in June. Made patient aware we just received renewal and will get sent over and are trying to get his appointment moved up as well. Patient appreciative of call.

## 2023-04-13 NOTE — Telephone Encounter (Signed)
Patient called back to verify medication dosing, which matched medication list.

## 2023-04-14 ENCOUNTER — Other Ambulatory Visit: Payer: Self-pay

## 2023-04-14 MED ORDER — VUMERITY 231 MG PO CPDR: 2.0000 | DELAYED_RELEASE_CAPSULE | Freq: Two times a day (BID) | ORAL | 11 refills | Status: DC

## 2023-04-16 ENCOUNTER — Telehealth: Payer: Self-pay | Admitting: Neurology

## 2023-04-16 NOTE — Telephone Encounter (Signed)
Pt stated he needs a nurse to call [id number] - [place], [place] - [address]  about refill  VUMERITY 231 MG CPDR

## 2023-04-20 NOTE — Telephone Encounter (Signed)
Called and spoke to pharmacy and they have medication corrected now, please let patient know. And advise him to keep appointment or we cannot refill

## 2023-04-21 DIAGNOSIS — G4733 Obstructive sleep apnea (adult) (pediatric): Secondary | ICD-10-CM | POA: Diagnosis not present

## 2023-04-21 NOTE — Telephone Encounter (Signed)
Called pt. Informed him of message nurse Katie sent. Pt said thank you for calling.

## 2023-04-29 DIAGNOSIS — E119 Type 2 diabetes mellitus without complications: Secondary | ICD-10-CM | POA: Diagnosis not present

## 2023-04-29 DIAGNOSIS — H40003 Preglaucoma, unspecified, bilateral: Secondary | ICD-10-CM | POA: Diagnosis not present

## 2023-05-12 NOTE — Progress Notes (Unsigned)
No chief complaint on file.    ASSESSMENT AND PLAN Relapsing remitting multiple sclerosis  Stable on Vumerity  Laboratory evaluation including CBC, TSH  Repeat MRI of the brain with without contrast Cognitive impairment  MoCA examination 17 out of 30 today Gait abnormality  Continue moderate exercise    DIAGNOSTIC DATA (LABS, IMAGING, TESTING) - I reviewed patient records, labs, notes, testing and imaging myself where available.  MRI scan of the brain with and without contrast in Sept 2021: showing confluent periventricular and subcortical white matter hyperintensities compatible with chronic demyelinating disease.  No acute enhancing lesions are noted.  The presence of T1 black holes and mild atrophy of corpus callosum and cortex indicates chronic disease.  Overall no significant change compared with previous MRI dated 06/27/2019  Laboratory evaluation August 2022: Vitamin D 26.5    HISTORY OF PRESENT ILLNESS: Thomas Nixon is a 52 y.o. male here today for follow up for RRMS.  Thomas Nixon is followed routinely by Dr Terrace Arabia and Margie Ege, DNP. He has continued Vumerity and tolerating well. MRI 2021 was stable. Labs have been unremarkable with exception of elevated JCV and low vitamin D.   He is doing well. No new or exacerbating symptoms. He feels that gait is stable. No falls. He is sleeping well. He feels mood is good. No changes to bowel or bladder habits. No vision changes.   He continues vitamin D supplements 1000iu daily. He admits that he is not always consistent with taking supplement.   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  Also discussed findings with his wife, he has mild memory loss, MoCA examination is 46 out of 30 today  Update May 13, 2023 SS:   PHYSICAL EXAM  Vitals:   12/29/21 1328  BP: (!) 160/92  Pulse: 87  Weight: 246 lb (111.6 kg)  Height: 5\' 9"  (1.753 m)   Body mass index is 36.33 kg/m.   Generalized: Well developed, in no acute distress  Cardiology: normal rate and rhythm, no murmur auscultated  Respiratory: clear to auscultation bilaterally    Neurological examination:  Mentation:  awake, alert, following command,     12/29/2021    1:00 PM  Montreal Cognitive Assessment   Visuospatial/ Executive (0/5) 2  Naming (0/3) 3  Attention: Read list of digits (0/2) 1  Attention: Read list of letters (0/1) 1  Attention: Serial 7 subtraction starting at 100 (0/3) 1  Language: Repeat phrase (0/2) 2  Language : Fluency (0/1) 1  Abstraction (0/2) 2  Delayed Recall (0/5) 0  Orientation (0/6) 4  Total 17    Cranial nerve II-XII: Pupils were equal round reactive to light. Extraocular movements were full, visual field were full on confrontational test. Facial sensation and strength were normal. Head turning and shoulder shrug  were normal and symmetric.  Motor: The motor testing reveals 5 over 5 strength of all 4 extremities. Good symmetric motor tone is noted throughout.  Sensory: Sensory testing is intact to soft touch on all 4 extremities. No evidence of extinction is noted.   Coordination: Cerebellar testing reveals good finger-nose-finger and heel-to-shin bilaterally.   Gait and station: Able to push to standing position. Gait is cautious, no assistive device, mildly spastic, drags right foot. Tandem not attempted  Reflexes: Deep tendon  reflexes are symmetric and normal bilaterally.   REVIEW OF SYSTEMS: Out of a complete 14 system review of symptoms, the patient complains only of the following symptoms, fatigue, weakness of right lower extremity and all other reviewed systems are negative.   ALLERGIES: Allergies  Allergen Reactions   Pork-Derived Products Other (See Comments)    DIET RESTRICTIONS     HOME MEDICATIONS: Outpatient Medications Prior to Visit  Medication Sig Dispense Refill   ACCU-CHEK GUIDE test strip      amLODipine (NORVASC) 10 MG tablet Take 10 mg daily by mouth.  3   atorvastatin (LIPITOR) 40 MG tablet Take 1 tablet (40 mg total) by mouth at bedtime. 30 tablet 1   Cholecalciferol (VITAMIN D3 PO) Take 1,000 Units by mouth daily.     losartan (COZAAR) 100 MG tablet Take 100 mg by mouth daily.     LYRICA 300 MG capsule Take 300 mg 2 (two) times daily by mouth.  2   metFORMIN (GLUCOPHAGE) 1000 MG tablet Take 1,000 mg by mouth 2 (two) times daily with a meal.     metoprolol succinate (TOPROL-XL) 50 MG 24 hr tablet TAKE 1 TABLET BY MOUTH EVERY DAY WITH OR IMMEDIATELY FOLLOWING A MEAL 90 tablet 3   traMADol (ULTRAM) 50 MG tablet Take 50 mg 2 (two) times daily by mouth.  3   VUMERITY 231 MG CPDR Take 2 capsules by mouth 2 (two) times daily. 432mg  po bid maintenance. 120 capsule 11   XARELTO 20 MG TABS tablet TAKE 1 TABLET BY MOUTH DAILY WITH SUPPER 30 tablet 5   No facility-administered medications prior to visit.     PAST MEDICAL HISTORY: Past Medical History:  Diagnosis Date   CVA (cerebral infarction)    Diabetes mellitus without complication (HCC)    Type 2   Encounter for long-term (current) use of other medications    Headache(784.0)    Hypertension    MS (multiple sclerosis) (HCC)    possible   Neuropathy    Other and unspecified hyperlipidemia    Stroke (HCC)    5 strokes   Thoracic or lumbosacral neuritis or radiculitis, unspecified    Unspecified late effects of cerebrovascular  disease    Unspecified sleep apnea    does not use cpap     PAST SURGICAL HISTORY: Past Surgical History:  Procedure Laterality Date   ANTERIOR LAT LUMBAR FUSION N/A 08/30/2014   Procedure: ANTERIOR LATERAL LUMBAR FUSION 1 LEVEL;  Surgeon: Emilee Hero, MD;  Location: [facility] OR;  Service: Orthopedics;  Laterality: N/A;  Lumbar 3-4 lateral interbody fusion with instrumentation and allograft   BRAIN BIOPSY       FAMILY HISTORY: Family History  Problem Relation Age of Onset   High blood pressure Mother    Diabetes Mother    High blood pressure Father    Stroke Maternal Grandmother    Stroke Paternal Grandmother      SOCIAL HISTORY: Social History   Socioeconomic History   Marital status: Married    Spouse name: [name]   Number of children: 3   Years of education: college   Highest education level: Not on  file  Occupational History    Comment: Disabled  Tobacco Use   Smoking status: Light Smoker    Types: Cigars   Smokeless tobacco: Never  Vaping Use   Vaping Use: Never used  Substance and Sexual Activity   Alcohol use: Yes    Comment: Once per month.   Drug use: No   Sexual activity: Not on file  Other Topics Concern   Not on file  Social History Narrative   Patient is married and lives at home with his wife [name] .   Disabled.   Education [employer].   Right handed.   1 cup caffeine per day.   Social Determinants of Health   Financial Resource Strain: Not on file  Food Insecurity: No Food Insecurity (12/23/2022)   Hunger Vital Sign    Worried About Running Out of Food in the Last Year: Never true    Ran Out of Food in the Last Year: Never true  Transportation Needs: No Transportation Needs (12/23/2022)   PRAPARE - Administrator, Civil Service (Medical): No    Lack of Transportation (Non-Medical): No  Physical Activity: Not on file  Stress: Not on file  Social Connections: Not on file  Intimate Partner Violence: Not on file      Margie Ege, Edrick Oh, [facility]  [facility] [address], [address] [place], [place] [address] [phone]

## 2023-05-13 ENCOUNTER — Ambulatory Visit: Payer: Medicare PPO | Admitting: Neurology

## 2023-05-13 ENCOUNTER — Encounter: Payer: Self-pay | Admitting: Neurology

## 2023-05-13 VITALS — BP 143/88 | HR 76 | Ht 69.0 in | Wt 244.9 lb

## 2023-05-13 DIAGNOSIS — G35 Multiple sclerosis: Secondary | ICD-10-CM | POA: Diagnosis not present

## 2023-05-13 DIAGNOSIS — R269 Unspecified abnormalities of gait and mobility: Secondary | ICD-10-CM | POA: Diagnosis not present

## 2023-05-13 NOTE — Patient Instructions (Signed)
Check labs, continue Vumerity, call for any worsening

## 2023-05-14 LAB — CBC WITH DIFFERENTIAL/PLATELET
Basophils Absolute: 0 10*3/uL (ref 0.0–0.2)
Basos: 1 %
EOS (ABSOLUTE): 0.1 10*3/uL (ref 0.0–0.4)
Eos: 1 %
Hematocrit: 41.7 % (ref 37.5–51.0)
Hemoglobin: 13.2 g/dL (ref 13.0–17.7)
Immature Grans (Abs): 0 10*3/uL (ref 0.0–0.1)
Immature Granulocytes: 0 %
Lymphocytes Absolute: 1.2 10*3/uL (ref 0.7–3.1)
Lymphs: 21 %
MCH: 27.1 pg (ref 26.6–33.0)
MCHC: 31.7 g/dL (ref 31.5–35.7)
MCV: 86 fL (ref 79–97)
Monocytes Absolute: 0.5 10*3/uL (ref 0.1–0.9)
Monocytes: 8 %
Neutrophils Absolute: 4.1 10*3/uL (ref 1.4–7.0)
Neutrophils: 69 %
Platelets: 203 10*3/uL (ref 150–450)
RBC: 4.87 x10E6/uL (ref 4.14–5.80)
RDW: 13.6 % (ref 11.6–15.4)
WBC: 5.9 10*3/uL (ref 3.4–10.8)

## 2023-05-14 LAB — COMPREHENSIVE METABOLIC PANEL
ALT: 15 IU/L (ref 0–44)
AST: 20 IU/L (ref 0–40)
Albumin: 4.3 g/dL (ref 4.1–5.1)
Alkaline Phosphatase: 42 IU/L — ABNORMAL LOW (ref 44–121)
BUN/Creatinine Ratio: 15 (ref 9–20)
BUN: 17 mg/dL (ref 6–24)
Bilirubin Total: 0.4 mg/dL (ref 0.0–1.2)
CO2: 26 mmol/L (ref 20–29)
Calcium: 9.2 mg/dL (ref 8.7–10.2)
Chloride: 105 mmol/L (ref 96–106)
Creatinine, Ser: 1.16 mg/dL (ref 0.76–1.27)
Globulin, Total: 2.5 g/dL (ref 1.5–4.5)
Glucose: 140 mg/dL — ABNORMAL HIGH (ref 70–99)
Potassium: 4.7 mmol/L (ref 3.5–5.2)
Sodium: 146 mmol/L — ABNORMAL HIGH (ref 134–144)
Total Protein: 6.8 g/dL (ref 6.0–8.5)
eGFR: 77 mL/min/{1.73_m2} (ref >59)

## 2023-07-07 DIAGNOSIS — H40003 Preglaucoma, unspecified, bilateral: Secondary | ICD-10-CM | POA: Diagnosis not present

## 2023-08-04 DIAGNOSIS — G4733 Obstructive sleep apnea (adult) (pediatric): Secondary | ICD-10-CM | POA: Diagnosis not present

## 2023-08-13 DIAGNOSIS — E119 Type 2 diabetes mellitus without complications: Secondary | ICD-10-CM | POA: Diagnosis not present

## 2023-09-03 DIAGNOSIS — G4733 Obstructive sleep apnea (adult) (pediatric): Secondary | ICD-10-CM | POA: Diagnosis not present

## 2023-10-04 DIAGNOSIS — G4733 Obstructive sleep apnea (adult) (pediatric): Secondary | ICD-10-CM | POA: Diagnosis not present

## 2023-10-13 DIAGNOSIS — G35 Multiple sclerosis: Secondary | ICD-10-CM | POA: Diagnosis not present

## 2023-10-13 DIAGNOSIS — I10 Essential (primary) hypertension: Secondary | ICD-10-CM | POA: Diagnosis not present

## 2023-10-13 DIAGNOSIS — E1140 Type 2 diabetes mellitus with diabetic neuropathy, unspecified: Secondary | ICD-10-CM | POA: Diagnosis not present

## 2023-10-13 DIAGNOSIS — E1142 Type 2 diabetes mellitus with diabetic polyneuropathy: Secondary | ICD-10-CM | POA: Diagnosis not present

## 2023-10-13 DIAGNOSIS — Z6835 Body mass index (BMI) 35.0-35.9, adult: Secondary | ICD-10-CM | POA: Diagnosis not present

## 2023-10-13 DIAGNOSIS — D6869 Other thrombophilia: Secondary | ICD-10-CM | POA: Diagnosis not present

## 2023-10-13 DIAGNOSIS — I480 Paroxysmal atrial fibrillation: Secondary | ICD-10-CM | POA: Diagnosis not present

## 2023-10-13 DIAGNOSIS — Z8673 Personal history of transient ischemic attack (TIA), and cerebral infarction without residual deficits: Secondary | ICD-10-CM | POA: Diagnosis not present

## 2023-10-13 DIAGNOSIS — Z23 Encounter for immunization: Secondary | ICD-10-CM | POA: Diagnosis not present

## 2023-10-14 ENCOUNTER — Other Ambulatory Visit: Payer: Self-pay | Admitting: Cardiology

## 2023-10-14 DIAGNOSIS — I4891 Unspecified atrial fibrillation: Secondary | ICD-10-CM

## 2023-10-14 NOTE — Telephone Encounter (Addendum)
Xarelto 20mg  refill request received. Pt is 52 years old, weight-111.1kg, Crea-1.16 on 05/13/23, last seen by Dr. Anne Fu on 04/30/22-PT NEEDS AN APPT, Diagnosis-Afib, CrCl-119.72 mL/min; Dose is appropriate based on dosing criteria.   PT NEED AN APPT WITH CARDIOLOGIST  Message sent to the Schedulers.  Pt has an appt with Robin Searing on 12/09/23. Will send in refill to requested pharmacy.

## 2023-11-03 DIAGNOSIS — G4733 Obstructive sleep apnea (adult) (pediatric): Secondary | ICD-10-CM | POA: Diagnosis not present

## 2023-11-25 ENCOUNTER — Ambulatory Visit: Payer: Medicare PPO | Admitting: Neurology

## 2023-11-30 ENCOUNTER — Encounter: Payer: Self-pay | Admitting: Neurology

## 2023-11-30 ENCOUNTER — Ambulatory Visit: Payer: Medicare PPO | Admitting: Neurology

## 2023-11-30 VITALS — BP 150/88 | HR 76 | Ht 70.0 in | Wt 237.0 lb

## 2023-11-30 DIAGNOSIS — E559 Vitamin D deficiency, unspecified: Secondary | ICD-10-CM | POA: Diagnosis not present

## 2023-11-30 DIAGNOSIS — R269 Unspecified abnormalities of gait and mobility: Secondary | ICD-10-CM | POA: Diagnosis not present

## 2023-11-30 DIAGNOSIS — G35 Multiple sclerosis: Secondary | ICD-10-CM | POA: Diagnosis not present

## 2023-11-30 NOTE — Progress Notes (Signed)
 Chief Complaint  Patient presents with   Follow-up    Rm14, wife present, MS GAIT: gait is worse, aphasia worsening, slower thought processing slower   ASSESSMENT AND PLAN 1.  Relapsing remitting multiple sclerosis 2.  Cognitive impairment 3.  Gait abnormality  Feels overall stable on Vumerity   Check labs today  Repeat MRI of the brain   Refer to physical therapy  Return To Clinic With NP In 9-12 Months    DIAGNOSTIC DATA (LABS, IMAGING, TESTING) - I reviewed patient records, labs, notes, testing and imaging myself where available.  MRI scan of the brain with and without contrast in Sept 2021: showing confluent periventricular and subcortical white matter hyperintensities compatible with chronic demyelinating disease.  No acute enhancing lesions are noted.  The presence of T1 black holes and mild atrophy of corpus callosum and cortex indicates chronic disease.  Overall no significant change compared with previous MRI dated 06/27/2019  Laboratory evaluation August 2022: Vitamin D  26.5    HISTORY OF PRESENT ILLNESS: Thomas Nixon is a 53 y.o. male here today for follow up for RRMS.  [name] is followed routinely by Dr Onita and Lauraine Born, DNP. He has continued Vumerity  and tolerating well. MRI 2021 was stable. Labs have been unremarkable with exception of elevated JCV and low vitamin D .   He is doing well. No new or exacerbating symptoms. He feels that gait is stable. No falls. He is sleeping well. He feels mood is good. No changes to bowel or bladder habits. No vision changes.   He continues vitamin D  supplements 1000iu daily. He admits that he is not always consistent with taking supplement.   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  Also discussed findings with his wife, he has mild memory loss, MoCA examination is 28 out of 54 today  UPDATE Jan 7th 2024: He is with his wife, he seems to have more trouble walking, stiff,   more cognitive slow, like to watch videos at home, more sedentary, still driving,  He is tolerating Vumerity , most recent MRI of the brain with without contrast was in February 2023, showing scattered brainstem, cerebellum, periatrial, periventricular and subcortical white matter hyperintensities and a patent distribution compatible with chronic demyelinating disease. No acute or enhancing lesions are noted. Remote area of encephalomalacia with gliosis and certainly moderate hemorrhagic products in the right frontal periventricular region likely represents an old stroke. No enhancing lesions are noted. Overall no significant change compared with previous MRI from 08/03/2020.      PHYSICAL EXAM  Vitals:   12/29/21 1328  BP: (!) 160/92  Pulse: 87  Weight: 246 lb (111.6 kg)  Height: 5' 9 (1.753 m)   Body mass index is 36.33 kg/m.  Generalized: Well developed, in no acute distress  Cardiology: normal rate and rhythm, no murmur auscultated  Respiratory: clear to auscultation bilaterally    Neurological examination:  Mentation: Depressed looking middle-age male, slow reaction, following command, answers  questions appropriately     12/29/2021    1:00 PM  Montreal Cognitive Assessment   Visuospatial/ Executive (0/5) 2  Naming (0/3) 3  Attention: Read list of digits (0/2) 1  Attention: Read list of letters (0/1) 1  Attention: Serial 7 subtraction  starting at 100 (0/3) 1  Language: Repeat phrase (0/2) 2  Language : Fluency (0/1) 1  Abstraction (0/2) 2  Delayed Recall (0/5) 0  Orientation (0/6) 4  Total 17    Cranial nerve II-XII: Pupils were equal round reactive to light. Extraocular movements were full, visual field were full on confrontational test. Facial sensation and strength were normal. Head turning and shoulder shrug  were normal and symmetric.  Motor: The motor testing reveals 5 over 5 strength of all 4 extremities. Good symmetric motor tone is noted throughout.   Sensory: Sensory testing is intact to soft touch on all 4 extremities. No evidence of extinction is noted.   Coordination: Cerebellar testing reveals good finger-nose-finger and heel-to-shin bilaterally.   Gait and station: Push-up to get up from seated position, cautious unsteady dragging left leg  Reflexes: Deep tendon reflexes are symmetric and normal bilaterally.   REVIEW OF SYSTEMS: Out of a complete 14 system review of symptoms, the patient complains only of the following symptoms, fatigue, weakness of right lower extremity and all other reviewed systems are negative.   ALLERGIES: Allergies  Allergen Reactions   Pork-Derived Products Other (See Comments)    DIET RESTRICTIONS     HOME MEDICATIONS: Outpatient Medications Prior to Visit  Medication Sig Dispense Refill   ACCU-CHEK GUIDE test strip      amLODipine  (NORVASC ) 10 MG tablet Take 10 mg daily by mouth.  3   atorvastatin  (LIPITOR) 40 MG tablet Take 1 tablet (40 mg total) by mouth at bedtime. 30 tablet 1   Cholecalciferol (VITAMIN D3 PO) Take 1,000 Units by mouth daily.     losartan (COZAAR) 100 MG tablet Take 100 mg by mouth daily.     LYRICA  300 MG capsule Take 300 mg 2 (two) times daily by mouth.  2   metFORMIN  (GLUCOPHAGE ) 1000 MG tablet Take 1,000 mg by mouth 2 (two) times daily with a meal.     metoprolol  succinate (TOPROL -XL) 50 MG 24 hr tablet TAKE 1 TABLET BY MOUTH EVERY DAY WITH  OR IMMEDIATELY FOLLOWING A MEAL 90 tablet 3   rivaroxaban  (XARELTO ) 20 MG TABS tablet TAKE 1 TABLET BY MOUTH DAILY WITH SUPPER 30 tablet 3   Semaglutide,0.25 or 0.5MG /DOS, (OZEMPIC, 0.25 OR 0.5 MG/DOSE,) 2 MG/1.5ML SOPN Inject 1.5 mLs into the skin every 7 (seven) days.     VUMERITY  231 MG CPDR Take 2 capsules by mouth 2 (two) times daily. 432mg  po bid maintenance. 120 capsule 11   No facility-administered medications prior to visit.     PAST MEDICAL HISTORY: Past Medical History:  Diagnosis Date   CVA (cerebral infarction)    Diabetes mellitus without complication (HCC)    Type 2   Encounter for long-term (current) use of other medications    Headache(784.0)    Hypertension    MS (multiple sclerosis) (HCC)    possible   Neuropathy    Other and unspecified hyperlipidemia    Stroke (HCC)    5 strokes   Thoracic or lumbosacral neuritis or radiculitis, unspecified    Unspecified late effects of cerebrovascular disease    Unspecified sleep apnea    does not use cpap     PAST SURGICAL HISTORY:  Past Surgical History:  Procedure Laterality Date   ANTERIOR LAT LUMBAR FUSION N/A 08/30/2014   Procedure: ANTERIOR LATERAL LUMBAR FUSION 1 LEVEL;  Surgeon: Oneil Rodgers Priestly, MD;  Location: [facility] OR;  Service: Orthopedics;  Laterality: N/A;  Lumbar 3-4 lateral interbody fusion with instrumentation and allograft   BRAIN BIOPSY       FAMILY HISTORY: Family History  Problem Relation Age of Onset   High blood pressure Mother    Diabetes Mother    High blood pressure Father    Stroke Maternal Grandmother    Stroke Paternal Grandmother      SOCIAL HISTORY: Social History   Socioeconomic History   Marital status: Married    Spouse name: Montie   Number of children: 3   Years of education: college   Highest education level: Not on file  Occupational History    Comment: Disabled  Tobacco Use   Smoking status: Light Smoker    Types: Cigars   Smokeless tobacco: Never  Vaping  Use   Vaping status: Never Used  Substance and Sexual Activity   Alcohol use: Yes    Comment: Once per month.   Drug use: No   Sexual activity: Not on file  Other Topics Concern   Not on file  Social History Narrative   Patient is married and lives at home with his wife Montie .   Disabled.   Education [employer].   Right handed.   1 cup caffeine per day.   Social Drivers of Corporate Investment Banker Strain: Not on file  Food Insecurity: No Food Insecurity (12/23/2022)   Hunger Vital Sign    Worried About Running Out of Food in the Last Year: Never true    Ran Out of Food in the Last Year: Never true  Transportation Needs: No Transportation Needs (12/23/2022)   PRAPARE - Administrator, Civil Service (Medical): No    Lack of Transportation (Non-Medical): No  Physical Activity: Not on file  Stress: Not on file  Social Connections: Not on file  Intimate Partner Violence: Not on file     Modena Callander, M.D. Ph.D.  [facility] [address] [place], [place] [address] Phone: [phone] Fax:      [phone]

## 2023-12-01 ENCOUNTER — Telehealth: Payer: Self-pay | Admitting: Neurology

## 2023-12-01 LAB — COMPREHENSIVE METABOLIC PANEL
ALT: 10 [IU]/L (ref 0–44)
AST: 14 [IU]/L (ref 0–40)
Albumin: 4.3 g/dL (ref 4.1–5.1)
Alkaline Phosphatase: 40 [IU]/L — ABNORMAL LOW (ref 44–121)
BUN/Creatinine Ratio: 15 (ref 9–20)
BUN: 21 mg/dL (ref 6–24)
Bilirubin Total: 0.6 mg/dL (ref 0.0–1.2)
CO2: 26 mmol/L (ref 20–29)
Calcium: 8.9 mg/dL (ref 8.7–10.2)
Chloride: 106 mmol/L (ref 96–106)
Creatinine, Ser: 1.38 mg/dL — ABNORMAL HIGH (ref 0.76–1.27)
Globulin, Total: 2.5 g/dL (ref 1.5–4.5)
Glucose: 107 mg/dL — ABNORMAL HIGH (ref 70–99)
Potassium: 4.1 mmol/L (ref 3.5–5.2)
Sodium: 143 mmol/L (ref 134–144)
Total Protein: 6.8 g/dL (ref 6.0–8.5)
eGFR: 62 mL/min/{1.73_m2} (ref >59)

## 2023-12-01 LAB — CBC WITH DIFFERENTIAL/PLATELET
Basophils Absolute: 0 10*3/uL (ref 0.0–0.2)
Basos: 0 %
EOS (ABSOLUTE): 0 10*3/uL (ref 0.0–0.4)
Eos: 1 %
Hematocrit: 43.3 % (ref 37.5–51.0)
Hemoglobin: 13.4 g/dL (ref 13.0–17.7)
Immature Grans (Abs): 0 10*3/uL (ref 0.0–0.1)
Immature Granulocytes: 0 %
Lymphocytes Absolute: 1.6 10*3/uL (ref 0.7–3.1)
Lymphs: 22 %
MCH: 27.1 pg (ref 26.6–33.0)
MCHC: 30.9 g/dL — ABNORMAL LOW (ref 31.5–35.7)
MCV: 88 fL (ref 79–97)
Monocytes Absolute: 0.5 10*3/uL (ref 0.1–0.9)
Monocytes: 7 %
Neutrophils Absolute: 5.1 10*3/uL (ref 1.4–7.0)
Neutrophils: 70 %
Platelets: 205 10*3/uL (ref 150–450)
RBC: 4.95 x10E6/uL (ref 4.14–5.80)
RDW: 12.9 % (ref 11.6–15.4)
WBC: 7.4 10*3/uL (ref 3.4–10.8)

## 2023-12-01 LAB — VITAMIN D 25 HYDROXY (VIT D DEFICIENCY, FRACTURES): Vit D, 25-Hydroxy: 25.2 ng/mL — ABNORMAL LOW (ref 30.0–100.0)

## 2023-12-01 NOTE — Telephone Encounter (Signed)
 Francine Graven Berkley Harvey: [phone] exp. 12/01/23-01/30/24 sent to GI [phone]

## 2023-12-04 DIAGNOSIS — G4733 Obstructive sleep apnea (adult) (pediatric): Secondary | ICD-10-CM | POA: Diagnosis not present

## 2023-12-06 ENCOUNTER — Telehealth: Payer: Self-pay

## 2023-12-06 ENCOUNTER — Other Ambulatory Visit: Payer: Self-pay

## 2023-12-06 NOTE — Telephone Encounter (Signed)
 Lmtrc 1st attempt:  When pt calls back let them know that getting the vumerity free from manufacturer is expired and now must be sent to pharmacy and ask what specialty pharmacy he would like it sent

## 2023-12-07 MED ORDER — VUMERITY 231 MG PO CPDR: 2.0000 | DELAYED_RELEASE_CAPSULE | Freq: Two times a day (BID) | ORAL | 11 refills | Status: DC

## 2023-12-07 NOTE — Telephone Encounter (Signed)
 Called and spoke to pt and sent rx to [facility] specialty

## 2023-12-07 NOTE — Progress Notes (Deleted)
Cardiology Office Note    Patient Name: Thomas Nixon Date of Encounter: 12/07/2023  Primary Care Provider:  Georgann Housekeeper, MD Primary Cardiologist:  Donato Schultz, MD Primary Electrophysiologist: None   Past Medical History    Past Medical History:  Diagnosis Date   CVA (cerebral infarction)    Diabetes mellitus without complication (HCC)    Type 2   Encounter for long-term (current) use of other medications    Headache(784.0)    Hypertension    MS (multiple sclerosis) (HCC)    possible   Neuropathy    Other and unspecified hyperlipidemia    Stroke (HCC)    5 strokes   Thoracic or lumbosacral neuritis or radiculitis, unspecified    Unspecified late effects of cerebrovascular disease    Unspecified sleep apnea    does not use cpap    History of Present Illness  Thomas Nixon is a 53 y.o. male with a PMH of paroxysmal AF, DM type II, possible CVA, HTN, HLD, OSA (not on CPAP), relapsing remitting MS who presents today for 1 year follow-up.  Thomas Nixon was seen initially by Dr. Anne Fu in 2021 for complaint of dyspnea on exertion.  EKG was completed with newly discovered AF and patient was placed on Toprol and Xarelto and Plavix was discontinued.  He underwent TTE that showed mildly reduced EF of 45-50% with mildly dilated left atrium and grade 2 DD with no significant valve abnormalities.  He was seen in follow-up on 11/2020 EKG shows sinus rhythm and did not require DCCV at that time.  He also reported resolution of shortness of breath.  He was last seen on 04/30/2022 for follow-up visit and reported  compliance with metoprolol and Xarelto.  He continued to do well with medications and denied any new cardiac complaints.  He has been followed by neurology for management of MS.  He noted no new exacerbation of symptoms and reported no falls at most recent visit.  During today's visit the patient reports*** .  Patient denies chest pain, palpitations, dyspnea, PND, orthopnea, nausea,  vomiting, dizziness, syncope, edema, weight gain, or early satiety.  ***Notes: -Last ischemic evaluation: -Last echo: -Interim ED visits: Review of Systems  Please see the history of present illness.    All other systems reviewed and are otherwise negative except as noted above.  Physical Exam    Wt Readings from Last 3 Encounters:  11/30/23 237 lb (107.5 kg)  05/13/23 244 lb 14.9 oz (111.1 kg)  04/30/22 245 lb (111.1 kg)   QI:HKVQQ were no vitals filed for this visit.,There is no height or weight on file to calculate BMI. GEN: Well nourished, well developed in no acute distress Neck: No JVD; No carotid bruits Pulmonary: Clear to auscultation without rales, wheezing or rhonchi  Cardiovascular: Normal rate. Regular rhythm. Normal S1. Normal S2.   Murmurs: There is no murmur.  ABDOMEN: Soft, non-tender, non-distended EXTREMITIES:  No edema; No deformity   EKG/LABS/ Recent Cardiac Studies   ECG personally reviewed by me today - ***  Risk Assessment/Calculations:   {Does this patient have ATRIAL FIBRILLATION?:[phone]}      Lab Results  Component Value Date   WBC 7.4 11/30/2023   HGB 13.4 11/30/2023   HCT 43.3 11/30/2023   MCV 88 11/30/2023   PLT 205 11/30/2023   Lab Results  Component Value Date   CREATININE 1.38 (H) 11/30/2023   BUN 21 11/30/2023   NA 143 11/30/2023   K 4.1 11/30/2023   CL  106 11/30/2023   CO2 26 11/30/2023   Lab Results  Component Value Date   CHOL 142 06/02/2013   HDL 34 (L) 06/02/2013   LDLCALC 92 06/02/2013   TRIG 81 06/02/2013   CHOLHDL 4.2 06/02/2013    Lab Results  Component Value Date   HGBA1C 7.0 (H) 06/11/2020   Assessment & Plan    1.  Paroxysmal AF:  2.  Essential hypertension:  3.  Hyperlipidemia:  4.  Relapsing remitting MS:      Disposition: Follow-up with Donato Schultz, MD or APP in *** months {Are you ordering a CV Procedure (e.g. stress test, cath, DCCV, TEE, etc)?   Press F2        :[id number]}    Signed, Napoleon Form, Leodis Rains, NP 12/07/2023, 10:03 AM [facility]

## 2023-12-09 ENCOUNTER — Ambulatory Visit: Payer: Medicare PPO | Admitting: Nurse Practitioner

## 2023-12-09 DIAGNOSIS — G35 Multiple sclerosis: Secondary | ICD-10-CM

## 2023-12-09 DIAGNOSIS — I480 Paroxysmal atrial fibrillation: Secondary | ICD-10-CM

## 2023-12-09 DIAGNOSIS — I10 Essential (primary) hypertension: Secondary | ICD-10-CM

## 2023-12-09 DIAGNOSIS — E785 Hyperlipidemia, unspecified: Secondary | ICD-10-CM

## 2023-12-22 DIAGNOSIS — G4733 Obstructive sleep apnea (adult) (pediatric): Secondary | ICD-10-CM | POA: Diagnosis not present

## 2023-12-28 DIAGNOSIS — K648 Other hemorrhoids: Secondary | ICD-10-CM | POA: Diagnosis not present

## 2023-12-28 DIAGNOSIS — D122 Benign neoplasm of ascending colon: Secondary | ICD-10-CM | POA: Diagnosis not present

## 2023-12-28 DIAGNOSIS — Z1211 Encounter for screening for malignant neoplasm of colon: Secondary | ICD-10-CM | POA: Diagnosis not present

## 2023-12-28 DIAGNOSIS — K5730 Diverticulosis of large intestine without perforation or abscess without bleeding: Secondary | ICD-10-CM | POA: Diagnosis not present

## 2023-12-28 DIAGNOSIS — D175 Benign lipomatous neoplasm of intra-abdominal organs: Secondary | ICD-10-CM | POA: Diagnosis not present

## 2023-12-28 DIAGNOSIS — D124 Benign neoplasm of descending colon: Secondary | ICD-10-CM | POA: Diagnosis not present

## 2023-12-30 DIAGNOSIS — D122 Benign neoplasm of ascending colon: Secondary | ICD-10-CM | POA: Diagnosis not present

## 2023-12-30 DIAGNOSIS — D124 Benign neoplasm of descending colon: Secondary | ICD-10-CM | POA: Diagnosis not present

## 2023-12-31 ENCOUNTER — Other Ambulatory Visit: Payer: Self-pay

## 2023-12-31 ENCOUNTER — Encounter (HOSPITAL_COMMUNITY): Payer: Self-pay | Admitting: Gastroenterology

## 2023-12-31 ENCOUNTER — Emergency Department (HOSPITAL_COMMUNITY)
Admission: EM | Admit: 2023-12-31 | Discharge: 2023-12-31 | Disposition: A | Discharge status: Home / Self Care | Payer: Medicare PPO | Attending: Emergency Medicine | Admitting: Emergency Medicine

## 2023-12-31 DIAGNOSIS — Z7984 Long term (current) use of oral hypoglycemic drugs: Secondary | ICD-10-CM | POA: Diagnosis not present

## 2023-12-31 DIAGNOSIS — Z8673 Personal history of transient ischemic attack (TIA), and cerebral infarction without residual deficits: Secondary | ICD-10-CM | POA: Insufficient documentation

## 2023-12-31 DIAGNOSIS — E119 Type 2 diabetes mellitus without complications: Secondary | ICD-10-CM | POA: Insufficient documentation

## 2023-12-31 DIAGNOSIS — Z79899 Other long term (current) drug therapy: Secondary | ICD-10-CM | POA: Diagnosis not present

## 2023-12-31 DIAGNOSIS — K922 Gastrointestinal hemorrhage, unspecified: Secondary | ICD-10-CM | POA: Diagnosis not present

## 2023-12-31 DIAGNOSIS — Z7901 Long term (current) use of anticoagulants: Secondary | ICD-10-CM | POA: Insufficient documentation

## 2023-12-31 DIAGNOSIS — I10 Essential (primary) hypertension: Secondary | ICD-10-CM | POA: Diagnosis not present

## 2023-12-31 DIAGNOSIS — F17290 Nicotine dependence, other tobacco product, uncomplicated: Secondary | ICD-10-CM | POA: Diagnosis not present

## 2023-12-31 DIAGNOSIS — K625 Hemorrhage of anus and rectum: Secondary | ICD-10-CM | POA: Diagnosis present

## 2023-12-31 DIAGNOSIS — D126 Benign neoplasm of colon, unspecified: Secondary | ICD-10-CM | POA: Diagnosis not present

## 2023-12-31 DIAGNOSIS — D500 Iron deficiency anemia secondary to blood loss (chronic): Secondary | ICD-10-CM | POA: Diagnosis not present

## 2023-12-31 DIAGNOSIS — K921 Melena: Secondary | ICD-10-CM | POA: Diagnosis not present

## 2023-12-31 LAB — CBC
HCT: 36.8 % — ABNORMAL LOW (ref 39.0–52.0)
HCT: 35.8 % — ABNORMAL LOW (ref 39.0–52.0)
HCT: 35.3 % — ABNORMAL LOW (ref 39.0–52.0)
Hemoglobin: 12.2 g/dL — ABNORMAL LOW (ref 13.0–17.0)
Hemoglobin: 11.7 g/dL — ABNORMAL LOW (ref 13.0–17.0)
Hemoglobin: 11.5 g/dL — ABNORMAL LOW (ref 13.0–17.0)
MCH: 27.6 pg (ref 26.0–34.0)
MCH: 27.5 pg (ref 26.0–34.0)
MCH: 27.3 pg (ref 26.0–34.0)
MCHC: 33.2 g/dL (ref 30.0–36.0)
MCHC: 32.7 g/dL (ref 30.0–36.0)
MCHC: 32.6 g/dL (ref 30.0–36.0)
MCV: 83.3 fL (ref 80.0–100.0)
MCV: 84.2 fL (ref 80.0–100.0)
MCV: 83.8 fL (ref 80.0–100.0)
Platelets: 143 10*3/uL — ABNORMAL LOW (ref 150–400)
Platelets: 139 10*3/uL — ABNORMAL LOW (ref 150–400)
Platelets: 144 10*3/uL — ABNORMAL LOW (ref 150–400)
RBC: 4.42 MIL/uL (ref 4.22–5.81)
RBC: 4.25 MIL/uL (ref 4.22–5.81)
RBC: 4.21 MIL/uL — ABNORMAL LOW (ref 4.22–5.81)
RDW: 14.1 % (ref 11.5–15.5)
RDW: 14.1 % (ref 11.5–15.5)
RDW: 14.3 % (ref 11.5–15.5)
WBC: 4.8 10*3/uL (ref 4.0–10.5)
WBC: 5.8 10*3/uL (ref 4.0–10.5)
WBC: 6.2 10*3/uL (ref 4.0–10.5)
nRBC: 0 % (ref 0.0–0.2)
nRBC: 0 % (ref 0.0–0.2)
nRBC: 0 % (ref 0.0–0.2)

## 2023-12-31 LAB — COMPREHENSIVE METABOLIC PANEL
ALT: 15 U/L (ref 0–44)
AST: 19 U/L (ref 15–41)
Albumin: 3.5 g/dL (ref 3.5–5.0)
Alkaline Phosphatase: 24 U/L — ABNORMAL LOW (ref 38–126)
Anion gap: 11 (ref 5–15)
BUN: 19 mg/dL (ref 6–20)
CO2: 26 mmol/L (ref 22–32)
Calcium: 8.6 mg/dL — ABNORMAL LOW (ref 8.9–10.3)
Chloride: 104 mmol/L (ref 98–111)
Creatinine, Ser: 1.18 mg/dL (ref 0.61–1.24)
GFR, Estimated: >60 mL/min (ref >60)
Glucose, Bld: 114 mg/dL — ABNORMAL HIGH (ref 70–99)
Potassium: 4.1 mmol/L (ref 3.5–5.1)
Sodium: 141 mmol/L (ref 135–145)
Total Bilirubin: 0.8 mg/dL (ref 0.0–1.2)
Total Protein: 6.3 g/dL — ABNORMAL LOW (ref 6.5–8.1)

## 2023-12-31 LAB — TYPE AND SCREEN
ABO/RH(D): A POS
Antibody Screen: NEGATIVE

## 2023-12-31 NOTE — ED Notes (Signed)
 This RN reviewed discharge instructions with patient. She verbalized understanding and denied any further questions. PT well appearing upon discharge and reports tolerable pain. Pt ambulated with stable gait to exit. Pt endorses ride home with family

## 2023-12-31 NOTE — Discharge Instructions (Addendum)
 It was a pleasure caring for you today in the emergency department.  Please do not take Xarelto  over the weekend.  Please call GI doctor office on Monday for repeat evaluation.  Please call GI office over the weekend if you notice any recurrence of bleeding or come to the ED for recheck  Avoid NSAIDS such as aleve/ibuprofen  Please return to the emergency department for any worsening or worrisome symptoms.

## 2023-12-31 NOTE — Consult Note (Signed)
 Reason for Consult: Concerns over post polypectomy bleed Referring Physician: ER physician  Thomas Nixon is an 53 y.o. male.  HPI: Patient seen and examined and discussed with the ER team as well as my partner Dr. Kriss and case discussed with his wife as well and patient had his first colonoscopy on Tuesday and had 1 small polyp and 1 large polyp removed the large 1 requiring 2 clips and I will plan to read the report later today but he has no pain and did show me a picture of his stool this morning which was really a little clot and a little dark blood but really not much and reassurance was given and he has not sure if he restarted his blood thinner Xarelto  yesterday or the day before and he is not on any extra aspirin and nonsteroidals and has no weakness dizziness or shortness of breath and no other complaints and he has not had any blood clots or stents or artificial valves and is on the blood thinner for arrhythmia  Past Medical History:  Diagnosis Date   CVA (cerebral infarction)    Diabetes mellitus without complication (HCC)    Type 2   Encounter for long-term (current) use of other medications    Headache(784.0)    Hypertension    MS (multiple sclerosis) (HCC)    possible   Neuropathy    Other and unspecified hyperlipidemia    Stroke (HCC)    5 strokes   Thoracic or lumbosacral neuritis or radiculitis, unspecified    Unspecified late effects of cerebrovascular disease    Unspecified sleep apnea    does not use cpap    Past Surgical History:  Procedure Laterality Date   ANTERIOR LAT LUMBAR FUSION N/A 08/30/2014   Procedure: ANTERIOR LATERAL LUMBAR FUSION 1 LEVEL;  Surgeon: Oneil Rodgers Priestly, MD;  Location: [facility] OR;  Service: Orthopedics;  Laterality: N/A;  Lumbar 3-4 lateral interbody fusion with instrumentation and allograft   BRAIN BIOPSY      Family History  Problem Relation Age of Onset   High blood pressure Mother    Diabetes Mother    High blood pressure  Father    Stroke Maternal Grandmother    Stroke Paternal Grandmother     Social History:  reports that he has been smoking cigars. He has never used smokeless tobacco. He reports current alcohol use. He reports that he does not use drugs.  Allergies:  Allergies  Allergen Reactions   Pork-Derived Products Other (See Comments)    DIET RESTRICTIONS    Medications: I have reviewed the patient's current medications.  Results for orders placed or performed during the hospital encounter of 12/31/23 (from the past 48 hours)  Comprehensive metabolic panel     Status: Abnormal   Collection Time: 12/31/23  8:24 AM  Result Value Ref Range   Sodium 141 135 - 145 mmol/L   Potassium 4.1 3.5 - 5.1 mmol/L   Chloride 104 98 - 111 mmol/L   CO2 26 22 - 32 mmol/L   Glucose, Bld 114 (H) 70 - 99 mg/dL    Comment: Glucose reference range applies only to samples taken after fasting for at least 8 hours.   BUN 19 6 - 20 mg/dL   Creatinine, Ser 8.81 0.61 - 1.24 mg/dL   Calcium  8.6 (L) 8.9 - 10.3 mg/dL   Total Protein 6.3 (L) 6.5 - 8.1 g/dL   Albumin 3.5 3.5 - 5.0 g/dL   AST 19 15 - 41  U/L   ALT 15 0 - 44 U/L   Alkaline Phosphatase 24 (L) 38 - 126 U/L   Total Bilirubin 0.8 0.0 - 1.2 mg/dL   GFR, Estimated >39 >39 mL/min    Comment: (NOTE) Calculated using the CKD-EPI Creatinine Equation (2021)    Anion gap 11 5 - 15    Comment: Performed at [facility], [address], [place], [place] [address]  CBC     Status: Abnormal   Collection Time: 12/31/23  8:24 AM  Result Value Ref Range   WBC 4.8 4.0 - 10.5 K/uL   RBC 4.42 4.22 - 5.81 MIL/uL   Hemoglobin 12.2 (L) 13.0 - 17.0 g/dL   HCT 63.1 (L) 60.9 - 47.9 %   MCV 83.3 80.0 - 100.0 fL   MCH 27.6 26.0 - 34.0 pg   MCHC 33.2 30.0 - 36.0 g/dL   RDW 85.8 88.4 - 84.4 %   Platelets 143 (L) 150 - 400 K/uL    Comment: REPEATED TO VERIFY   nRBC 0.0 0.0 - 0.2 %    Comment: Performed at [facility], [address] [place], [place] [address]   Type and screen [facility]     Status: None   Collection Time: 12/31/23  8:24 AM  Result Value Ref Range   ABO/RH(D) A POS    Antibody Screen NEG    Sample Expiration      01/03/2024,2359 Performed at [facility], [address], [place], [place] [address]     No results found.  Review of Systems negative except above Blood pressure (!) 141/99, pulse 70, temperature 98.4 F (36.9 C), temperature source Oral, resp. rate 17, height 5' 10 (1.778 m), weight 104.3 kg, SpO2 100%. Physical Exam vital signs stable afebrile no acute distress abdomen is soft nontender labs reviewed hemoglobin okay slight drop from baseline BUN and creatinine fine  Assessment/Plan: Seemingly minimal post polypectomy bleeding Plan: I have recommended watching him for 4 to 6 hours in the emergency room on clear liquids and seeing how he does and if no further bleeding I would hold Xarelto  2 or 3 more days and I cautioned him about aspirin and nonsteroidals and please call me back if any further GI question or problem otherwise if he requires admission I will check on tomorrow  [facility] E 12/31/2023, 9:52 AM

## 2023-12-31 NOTE — ED Provider Notes (Signed)
 [facility] AT [facility] Provider Note  CSN: [id number] Arrival date & time: 12/31/23 9241  Chief Complaint(s) Blood In Stools and Rectal Bleeding  HPI Thomas Nixon is a 53 y.o. male with past medical history as below, significant for CVA, DM2, paroxysmal A-fib, hypertension who presents to the ED with complaint of rectal bleeding  He is on blood thinners, they were held for colonoscopy, he had colonoscopy on Wednesday, noted some blood in his stool. Bright red blood mixed with dark red blood, showed me a picture. He has no abd pain, no n/v, he thinks he took his doac yestd but unsure. Otherwise well appearing   Past Medical History Past Medical History:  Diagnosis Date   CVA (cerebral infarction)    Diabetes mellitus without complication (HCC)    Type 2   Encounter for long-term (current) use of other medications    Headache(784.0)    Hypertension    MS (multiple sclerosis) (HCC)    possible   Neuropathy    Other and unspecified hyperlipidemia    Stroke (HCC)    5 strokes   Thoracic or lumbosacral neuritis or radiculitis, unspecified    Unspecified late effects of cerebrovascular disease    Unspecified sleep apnea    does not use cpap   Patient Active Problem List   Diagnosis Date Noted   Paroxysmal atrial fibrillation (HCC) 04/30/2022   Vitamin D  deficiency 06/11/2020   Gait abnormality 10/04/2017   Radiculopathy 08/30/2014   Multiple sclerosis (HCC) 08/10/2014   Diabetes mellitus without complication (HCC)    Cerebral infarction [facility])    Thoracic or lumbosacral neuritis or radiculitis, unspecified    Other and unspecified hyperlipidemia    Encounter for long-term (current) use of other medications    Late effects of cerebrovascular disease    Sleep apnea    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 Authorizing Provider  amLODipine  (NORVASC ) 10 MG tablet Take 10 mg daily by mouth. 08/09/17  Yes [provider]  atorvastatin  (LIPITOR) 40 MG tablet Take 1 tablet (40 mg total) by mouth at bedtime. 06/14/13  Yes Angiulli, Toribio PARAS, PA-C  losartan (COZAAR) 100 MG tablet Take 100 mg by mouth daily. 06/03/20  Yes [provider]  metFORMIN  (GLUCOPHAGE ) 1000 MG tablet Take 1,000 mg by mouth 2 (two) times daily with a meal.   Yes [provider]  pregabalin  (LYRICA ) 300 MG capsule Take 300 mg by mouth 2 (two) times daily.   Yes [provider]  rivaroxaban  (XARELTO ) 20 MG TABS tablet TAKE 1 TABLET BY MOUTH DAILY WITH SUPPER Patient taking differently: Take 20 mg by mouth at bedtime. 10/15/23  Yes Jeffrie Oneil BROCKS, MD  traMADol (ULTRAM) 50 MG tablet Take 50 mg by mouth every 6 (six) hours as needed.   Yes [provider]  VUMERITY  231 MG CPDR Take 2 capsules by mouth 2 (two) times daily. 432mg  po bid maintenance. 12/07/23  Yes Onita Duos, MD  metoprolol  succinate (TOPROL -XL) 50 MG 24 hr tablet TAKE 1 TABLET BY MOUTH EVERY DAY WITH OR IMMEDIATELY FOLLOWING A MEAL Patient not taking: Reported on 12/31/2023 06/08/22   Jeffrie Oneil BROCKS, MD  Semaglutide,0.25 or 0.5MG /DOS, (OZEMPIC, 0.25 OR 0.5 MG/DOSE,) 2 MG/1.5ML SOPN Inject 0.5 mg into the skin every 7 (seven) days. Patient not taking: Reported on 12/31/2023  [provider]                                                                                                                                    Past Surgical History Past Surgical History:  Procedure Laterality Date   ANTERIOR LAT LUMBAR FUSION N/A 08/30/2014   Procedure: ANTERIOR LATERAL LUMBAR FUSION 1 LEVEL;  Surgeon: Oneil Rodgers Priestly, MD;  Location: [facility] OR;  Service: Orthopedics;  Laterality: N/A;  Lumbar 3-4 lateral interbody fusion with instrumentation and allograft   BRAIN BIOPSY     Family History Family History   Problem Relation Age of Onset   High blood pressure Mother    Diabetes Mother    High blood pressure Father    Stroke Maternal Grandmother    Stroke Paternal Grandmother     Social History Social History   Tobacco Use   Smoking status: Light Smoker    Types: Cigars   Smokeless tobacco: Never  Vaping Use   Vaping status: Never Used  Substance Use Topics   Alcohol use: Yes    Comment: Once per month.   Drug use: No   Allergies Pork-derived products  Review of Systems Review of Systems  Constitutional:  Negative for chills and fever.  Respiratory:  Negative for chest tightness and shortness of breath.   Cardiovascular:  Negative for chest pain.  Gastrointestinal:  Positive for blood in stool. Negative for abdominal pain.  Genitourinary:  Negative for dysuria.  Neurological:  Negative for headaches.  All other systems reviewed and are negative.   Physical Exam Vital Signs  I have reviewed the triage vital signs BP 109/67 (BP Location: Right Arm)   Pulse 70   Temp 98.6 F (37 C) (Oral)   Resp 17   Ht 5' 10 (1.778 m)   Wt 104.3 kg   SpO2 100%   BMI 33.00 kg/m  Physical Exam Vitals and nursing note reviewed.  Constitutional:      General: He is not in acute distress.    Appearance: Normal appearance. He is well-developed. He is not ill-appearing.  HENT:     Head: Normocephalic and atraumatic.     Right Ear: External ear normal.     Left Ear: External ear normal.     Nose: Nose normal.     Mouth/Throat:     Mouth: Mucous membranes are moist.  Eyes:     General: No scleral icterus.       Right eye: No discharge.        Left eye: No discharge.  Cardiovascular:     Rate and Rhythm: Normal rate.  Pulmonary:     Effort: Pulmonary effort is normal. No respiratory distress.     Breath sounds: No stridor.  Abdominal:     General: Abdomen is flat. There is no distension.     Palpations: Abdomen is soft.     Tenderness: There is no  abdominal tenderness. There  is no guarding.  Musculoskeletal:        General: No deformity.     Cervical back: No rigidity.  Skin:    General: Skin is warm and dry.     Coloration: Skin is not cyanotic, jaundiced or pale.  Neurological:     Mental Status: He is alert and oriented to person, place, and time.     GCS: GCS eye subscore is 4. GCS verbal subscore is 5. GCS motor subscore is 6.  Psychiatric:        Speech: Speech normal.        Behavior: Behavior normal. Behavior is cooperative.     ED Results and Treatments Labs (all labs ordered are listed, but only abnormal results are displayed) Labs Reviewed  COMPREHENSIVE METABOLIC PANEL - Abnormal; Notable for the following components:      Result Value   Glucose, Bld 114 (*)    Calcium  8.6 (*)    Total Protein 6.3 (*)    Alkaline Phosphatase 24 (*)    All other components within normal limits  CBC - Abnormal; Notable for the following components:   Hemoglobin 12.2 (*)    HCT 36.8 (*)    Platelets 143 (*)    All other components within normal limits  CBC - Abnormal; Notable for the following components:   Hemoglobin 11.7 (*)    HCT 35.8 (*)    Platelets 139 (*)    All other components within normal limits  CBC - Abnormal; Notable for the following components:   RBC 4.21 (*)    Hemoglobin 11.5 (*)    HCT 35.3 (*)    Platelets 144 (*)    All other components within normal limits  TYPE AND SCREEN                                                                                                                          Radiology No results found.  Pertinent labs & imaging results that were available during my care of the patient were reviewed by me and considered in my medical decision making (see MDM for details).  Medications Ordered in ED Medications - No data to display                                                                                                                                   Procedures  Procedures  (including critical  care time)  Medical Decision Making / ED Course    Medical Decision Making:    JARET COPPEDGE is a 53 y.o. male  with past medical history as below, significant for CVA, DM2, paroxysmal A-fib, hypertension who presents to the ED with complaint of rectal bleeding. The complaint involves an extensive differential diagnosis and also carries with it a high risk of complications and morbidity.  Serious etiology was considered. Ddx includes but is not limited to: LGIB, UGIB, AVM, complication from procedure, diet, etc  Complete initial physical exam performed, notably the patient was in no distress, abd benign.    Reviewed and confirmed nursing documentation for past medical history, family history, social history.  Vital signs reviewed.       Brief summary: 53 yo male w/ hx as above here with blood in stool. Spoke w/ Dr Rosalie at bedside, recommend obs 4-6 hours, if bleeding stops can d/c and hold DOAC  CBC with slight decrease in HGB, is is HDS, feels the bleeding has stopped. No abd pain. No vomiting Spoke again w/ Dr Rosalie, hold DOAC this weekend, call office on Monday for recheck. Call Dr Rosalie this weekend if problems arise  The patient improved significantly and was discharged in stable condition. Detailed discussions were had with the patient/guardian regarding current findings, and need for close f/u with PCP or on call doctor. The patient/guardian has been instructed to return immediately if the symptoms worsen in any way for re-evaluation. Patient/guardian verbalized understanding and is in agreement with current care plan. All questions answered prior to discharge.          Additional history obtained: -Additional history obtained from family -External records from outside source obtained and reviewed including: Chart review including previous notes, labs, imaging, consultation notes including  Home meds Prior labs Prior gi documentation    Lab Tests: -I ordered,  reviewed, and interpreted labs.   The pertinent results include:   Labs Reviewed  COMPREHENSIVE METABOLIC PANEL - Abnormal; Notable for the following components:      Result Value   Glucose, Bld 114 (*)    Calcium  8.6 (*)    Total Protein 6.3 (*)    Alkaline Phosphatase 24 (*)    All other components within normal limits  CBC - Abnormal; Notable for the following components:   Hemoglobin 12.2 (*)    HCT 36.8 (*)    Platelets 143 (*)    All other components within normal limits  CBC - Abnormal; Notable for the following components:   Hemoglobin 11.7 (*)    HCT 35.8 (*)    Platelets 139 (*)    All other components within normal limits  CBC - Abnormal; Notable for the following components:   RBC 4.21 (*)    Hemoglobin 11.5 (*)    HCT 35.3 (*)    Platelets 144 (*)    All other components within normal limits  TYPE AND SCREEN    Notable for stable labs, hgb slight low  EKG   EKG Interpretation Date/Time:    Ventricular Rate:    PR Interval:    QRS Duration:    QT Interval:    QTC Calculation:   R Axis:      Text Interpretation:           Imaging Studies ordered: na   Medicines ordered and prescription drug management: No orders of the defined types were placed in this encounter.   -I have  reviewed the patients home medicines and have made adjustments as needed   Consultations Obtained: I requested consultation with the GI Dr Rosalie,  and discussed lab and imaging findings as well as pertinent plan - they recommend: hold AC, f/u monday   Cardiac Monitoring: Continuous pulse oximetry interpreted by myself, 100% on RA.    Social Determinants of Health:  Diagnosis or treatment significantly limited by social determinants of health: current smoker Counseled patient for approximately 3 minutes regarding smoking cessation. Discussed risks of smoking and how they applied and affected their visit here today. Patient not ready to quit at this time, however will  follow up with their primary doctor when they are.   CPT code: 00593: intermediate counseling for smoking cessation     Reevaluation: After the interventions noted above, I reevaluated the patient and found that they have improved  Co morbidities that complicate the patient evaluation  Past Medical History:  Diagnosis Date   CVA (cerebral infarction)    Diabetes mellitus without complication (HCC)    Type 2   Encounter for long-term (current) use of other medications    Headache(784.0)    Hypertension    MS (multiple sclerosis) (HCC)    possible   Neuropathy    Other and unspecified hyperlipidemia    Stroke (HCC)    5 strokes   Thoracic or lumbosacral neuritis or radiculitis, unspecified    Unspecified late effects of cerebrovascular disease    Unspecified sleep apnea    does not use cpap      Dispostion: Disposition decision including need for hospitalization was considered, and patient discharged from emergency department.    Final Clinical Impression(s) / ED Diagnoses Final diagnoses:  Gastrointestinal hemorrhage, unspecified gastrointestinal hemorrhage type        Elnor Jayson LABOR, DO 12/31/23 1411

## 2023-12-31 NOTE — ED Triage Notes (Signed)
 Pt. Stated, I had a colonoscopy on Wednesday and told to come in if he had blood in his stool . This morning I had blood in my stoll. Went off the blood thinner medicine and went back a day early.

## 2024-01-04 DIAGNOSIS — G4733 Obstructive sleep apnea (adult) (pediatric): Secondary | ICD-10-CM | POA: Diagnosis not present

## 2024-01-08 ENCOUNTER — Ambulatory Visit
Admission: RE | Admit: 2024-01-08 | Discharge: 2024-01-08 | Disposition: A | Discharge status: Home / Self Care | Payer: Medicare PPO | Source: Ambulatory Visit | Attending: Neurology | Admitting: Neurology

## 2024-01-08 DIAGNOSIS — E559 Vitamin D deficiency, unspecified: Secondary | ICD-10-CM

## 2024-01-08 DIAGNOSIS — R269 Unspecified abnormalities of gait and mobility: Secondary | ICD-10-CM

## 2024-01-08 DIAGNOSIS — G35 Multiple sclerosis: Secondary | ICD-10-CM | POA: Diagnosis not present

## 2024-01-08 MED ORDER — GADOPICLENOL 0.5 MMOL/ML IV SOLN
10.0000 mL | Freq: Once | INTRAVENOUS | Status: AC | PRN
  Administered 2024-01-08: 10 mL via INTRAVENOUS

## 2024-01-10 ENCOUNTER — Telehealth: Payer: Self-pay | Admitting: Neurology

## 2024-01-10 NOTE — Telephone Encounter (Signed)
 Please call patient MRI of the brain showed confluent periventricular white matter disease, slight progress compared to previous scan in February 2023  Please keep current treatment plan   IMPRESSION: MRI scan of the brain with and without contrast changes of periventricular, subcortical and periatrial T2/FLAIR white matter hyperintensities with differential discussed above.  This appears slightly progressed compared with previous MRI from 01/20/2022 no enhancing lesions are noted.   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

## 2024-01-10 NOTE — Telephone Encounter (Signed)
 Called and relayed the following info:  Please call patient MRI of the brain showed confluent periventricular white matter disease, slight progress compared to previous scan in February 2023   Please keep current treatment plan     Pt voiced gratitude and understanding

## 2024-01-12 DIAGNOSIS — G4733 Obstructive sleep apnea (adult) (pediatric): Secondary | ICD-10-CM | POA: Diagnosis not present

## 2024-01-12 DIAGNOSIS — G4731 Primary central sleep apnea: Secondary | ICD-10-CM | POA: Diagnosis not present

## 2024-02-01 DIAGNOSIS — G4733 Obstructive sleep apnea (adult) (pediatric): Secondary | ICD-10-CM | POA: Diagnosis not present

## 2024-02-14 ENCOUNTER — Telehealth: Payer: Self-pay | Admitting: Cardiology

## 2024-02-14 ENCOUNTER — Other Ambulatory Visit (HOSPITAL_COMMUNITY): Payer: Self-pay

## 2024-02-14 NOTE — Telephone Encounter (Signed)
 Pt c/o medication issue:  1. Name of Medication:   rivaroxaban (XARELTO) 20 MG TABS tablet    2. How are you currently taking this medication (dosage and times per day)?  TAKE 1 TABLET BY MOUTH DAILY WITH SUPPERPatient taking differently: Take 20 mg by mouth at bedtime.       3. Are you having a reaction (difficulty breathing--STAT)? No  4. What is your medication issue? Pt is requesting a callback regarding this medication costing him $350.00 so he wasn't able to get it and would like to discuss other options. Please advise

## 2024-02-14 NOTE — Telephone Encounter (Signed)
 Spoke with the patient who states that he cannot afford Xarelto. Advised him to reach out to his insurance company to see if this is just a deductible that he has to reach or if this is going to be his new monthly payment. I will also send a message to our med assistance team.

## 2024-02-14 NOTE — Telephone Encounter (Signed)
 Spoke with patient however it was very difficult to hear him.  He is going to contact his insurance company about a payment plan and also apply for Xarelto patient assistance online.  He will contact the office if he has any further needs or concerns.

## 2024-02-14 NOTE — Telephone Encounter (Signed)
 The Xarelto paid a claim recently so it will just say refill too soon. But I did a test claim on Eliquis and the copay is $47. The cost on Xarelto is likely high due to deductible and if he picks up that fill, he should meet his deductible ($350).

## 2024-02-16 DIAGNOSIS — R600 Localized edema: Secondary | ICD-10-CM | POA: Diagnosis not present

## 2024-02-16 DIAGNOSIS — I480 Paroxysmal atrial fibrillation: Secondary | ICD-10-CM | POA: Diagnosis not present

## 2024-02-16 DIAGNOSIS — I10 Essential (primary) hypertension: Secondary | ICD-10-CM | POA: Diagnosis not present

## 2024-02-16 DIAGNOSIS — G4733 Obstructive sleep apnea (adult) (pediatric): Secondary | ICD-10-CM | POA: Diagnosis not present

## 2024-02-16 DIAGNOSIS — E8779 Other fluid overload: Secondary | ICD-10-CM | POA: Diagnosis not present

## 2024-02-18 DIAGNOSIS — M7989 Other specified soft tissue disorders: Secondary | ICD-10-CM | POA: Diagnosis not present

## 2024-02-18 DIAGNOSIS — D649 Anemia, unspecified: Secondary | ICD-10-CM | POA: Diagnosis not present

## 2024-02-18 DIAGNOSIS — I509 Heart failure, unspecified: Secondary | ICD-10-CM | POA: Diagnosis not present

## 2024-02-21 ENCOUNTER — Other Ambulatory Visit (HOSPITAL_COMMUNITY): Payer: Self-pay | Admitting: Internal Medicine

## 2024-02-21 ENCOUNTER — Telehealth: Payer: Self-pay

## 2024-02-21 DIAGNOSIS — E8779 Other fluid overload: Secondary | ICD-10-CM

## 2024-02-21 NOTE — Progress Notes (Unsigned)
 Cardiology Office Note    Date:  02/24/2024  ID:  Thomas Nixon, DOB 08/12/1971, MRN [id number] PCP:  Georgann Housekeeper, MD  Cardiologist:  Donato Schultz, MD  Electrophysiologist:  None   Chief Complaint: Lower extremity edema  History of Present Illness: .    Thomas Nixon is a 53 y.o. male with visit-pertinent history of atrial fibrillation, diabetes, multiple sclerosis, prior MRI with intercerebral hemorrhages/periventricular white matter changes, chronic demyelinating changes noted on MRI.  Family history of coronary artery disease.  First evaluated by Dr. Anne Fu in 10/2020 for dyspnea on exertion with activity such as pushing things at work.  Patient denied any orthopnea.  Denied edema.  Noted the echocardiogram in 2014 indicated EF 50 to 55%.  Patient was found to be in atrial fibrillation, started on Xarelto 20 mg once daily.  Patient was planned to undergo cardioversion however patient self converted.  Echocardiogram in 11/2020 indicated LVEF of 45 to 50%, LV with mildly decreased function, global hypokinesis, internal cavity size was moderately dilated, mild concentric LVH, grade 2 diastolic dysfunction, RV systolic function was normal, there is mildly elevated pulmonary artery systolic pressures.  Left atria was mildly dilated.  Patient was last seen in clinic by Dr. Anne Fu in 04/2022, he remained stable from a cardiac standpoint.  Today he presents regarding increased lower extremity edema.  Patient reports that last week he presented to his PCPs office for increased lower extremity edema and some shortness of breath.  He was started on Lasix 20 mg daily, he reports significant improvement in his lower extremity edema and breathing since starting on Lasix, he feels this is overall resolved.  He denies any chest pain, palpitations, orthopnea or PND.  He denies any presyncope or syncope.  Patient is to have echocardiogram on Friday.  Patient reports that he walks his dog every other day and  tolerates this well.  Labwork independently reviewed: 02/17/2024: Hemoglobin 9.5, hematocrit 29.2, creatinine 1.37, sodium 144, potassium 4.3, AST 24, ALT 33, BNP 734 ROS: .   Today he denies chest pain, fatigue, palpitations, melena, hematuria, hemoptysis, diaphoresis, weakness, presyncope, syncope, orthopnea, and PND.  All other systems are reviewed and otherwise negative. Studies Reviewed: Marland Kitchen   EKG:  EKG is ordered today, personally reviewed, demonstrating  EKG Interpretation Date/Time:  Wednesday February 23 2024 08:50:35 EDT Ventricular Rate:  72 PR Interval:  200 QRS Duration:  112 QT Interval:  440 QTC Calculation: 481 R Axis:   122  Text Interpretation: Normal sinus rhythm Possible Left atrial enlargement Left posterior fascicular block Nonspecific T wave abnormality Prolonged QT Confirmed by Reather Littler [phone]) on 02/24/2024 2:58:40 PM   CV Studies: Cardiac studies reviewed are outlined and summarized above. Otherwise please see EMR for full report. Cardiac Studies & Procedures   ______________________________________________________________________________________________     ECHOCARDIOGRAM  ECHOCARDIOGRAM COMPLETE 12/06/2020  Narrative ECHOCARDIOGRAM REPORT    Patient Name:   Thomas Nixon Date of Exam: 12/06/2020 Medical Rec #:  [id number]     Height:       70.0 in Accession #:    [id number]    Weight:       240.2 lb Date of Birth:  07-01-1971     BSA:          2.256 m Patient Age:    49 years      BP:           138/90 mmHg Patient Gender: M  HR:           73 bpm. Exam Location:  [address]  Procedure: 2D Echo, Cardiac Doppler and Color Doppler  Indications:    I48.91 Atrial fibrillation  History:        Patient has prior history of Echocardiogram examinations, most recent 06/02/2013. Arrythmias:Atrial Fibrillation; Risk Factors:Diabetes, Hypertension and Current Smoker.  Sonographer:    Samule Ohm RDCS Referring Phys: [phone] MARK C  SKAINS   Sonographer Comments: Technically difficult study due to poor echo windows and patient is morbidly obese. Image acquisition challenging due to patient body habitus. IMPRESSIONS   1. Left ventricular ejection fraction, by estimation, is 45 to 50%. Left ventricular ejection fraction by PLAX is 45 %. The left ventricle has mildly decreased function. The left ventricle demonstrates global hypokinesis. The left ventricular internal cavity size was moderately dilated. There is mild concentric left ventricular hypertrophy. Left ventricular diastolic parameters are consistent with Grade II diastolic dysfunction (pseudonormalization). 2. Right ventricular systolic function is normal. The right ventricular size is normal. There is mildly elevated pulmonary artery systolic pressure. 3. Left atrial size was mildly dilated. 4. The mitral valve is normal in structure. Trivial mitral valve regurgitation. No evidence of mitral stenosis. 5. The aortic valve is tricuspid. Aortic valve regurgitation is not visualized. No aortic stenosis is present. 6. The inferior vena cava is normal in size with greater than 50% respiratory variability, suggesting right atrial pressure of 3 mmHg.  FINDINGS Left Ventricle: Left ventricular ejection fraction, by estimation, is 45 to 50%. Left ventricular ejection fraction by PLAX is 45 %. The left ventricle has mildly decreased function. The left ventricle demonstrates global hypokinesis. The left ventricular internal cavity size was moderately dilated. There is mild concentric left ventricular hypertrophy. Left ventricular diastolic parameters are consistent with Grade II diastolic dysfunction (pseudonormalization). Indeterminate filling pressures.  Right Ventricle: The right ventricular size is normal. No increase in right ventricular wall thickness. Right ventricular systolic function is normal. There is mildly elevated pulmonary artery systolic pressure. The tricuspid  regurgitant velocity is 2.97 m/s, and with an assumed right atrial pressure of 3 mmHg, the estimated right ventricular systolic pressure is 38.3 mmHg.  Left Atrium: Left atrial size was mildly dilated.  Right Atrium: Right atrial size was normal in size.  Pericardium: There is no evidence of pericardial effusion.  Mitral Valve: The mitral valve is normal in structure. Trivial mitral valve regurgitation. No evidence of mitral valve stenosis.  Tricuspid Valve: The tricuspid valve is normal in structure. Tricuspid valve regurgitation is mild . No evidence of tricuspid stenosis.  Aortic Valve: The aortic valve is tricuspid. Aortic valve regurgitation is not visualized. No aortic stenosis is present.  Pulmonic Valve: The pulmonic valve was normal in structure. Pulmonic valve regurgitation is trivial. No evidence of pulmonic stenosis.  Aorta: The aortic root is normal in size and structure.  Venous: The inferior vena cava is normal in size with greater than 50% respiratory variability, suggesting right atrial pressure of 3 mmHg.  IAS/Shunts: No atrial level shunt detected by color flow Doppler.   LEFT VENTRICLE PLAX 2D LV EF:         Left            Diastology ventricular     LV e' medial:    7.22 cm/s ejection        LV E/e' medial:  13.5 fraction by     LV e' lateral:   9.00 cm/s PLAX is 45  LV E/e' lateral: 10.9 %. LVIDd:         6.10 cm LVIDs:         4.70 cm LV PW:         1.10 cm LV IVS:        1.30 cm LVOT diam:     2.20 cm LV SV:         51 LV SV Index:   23 LVOT Area:     3.80 cm   RIGHT VENTRICLE             IVC RV S prime:     12.93 cm/s  IVC diam: 1.20 cm TAPSE (M-mode): 2.2 cm RVSP:           38.3 mmHg  LEFT ATRIUM             Index       RIGHT ATRIUM           Index LA diam:        4.70 cm 2.08 cm/m  RA Pressure: 3.00 mmHg LA Vol (A2C):   78.4 ml 34.75 ml/m RA Area:     17.10 cm LA Vol (A4C):   59.5 ml 26.37 ml/m RA Volume:   54.40 ml  24.11  ml/m LA Biplane Vol: 73.9 ml 32.75 ml/m AORTIC VALVE LVOT Vmax:   65.02 cm/s LVOT Vmean:  42.860 cm/s LVOT VTI:    0.135 m  AORTA Ao Root diam: 3.40 cm Ao Asc diam:  3.40 cm  MV E velocity: 97.82 cm/s  TRICUSPID VALVE MV A velocity: 78.62 cm/s  TR Peak grad:   35.3 mmHg MV E/A ratio:  1.24        TR Vmax:        297.00 cm/s Estimated RAP:  3.00 mmHg RVSP:           38.3 mmHg  SHUNTS Systemic VTI:  0.13 m Systemic Diam: 2.20 cm  Chilton Si MD Electronically signed by Chilton Si MD Signature Date/Time: 12/06/2020/12:58:16 PM    Final          ______________________________________________________________________________________________       Current Reported Medications:.    Current Meds  Medication Sig   amLODipine (NORVASC) 10 MG tablet Take 10 mg daily by mouth.   atorvastatin (LIPITOR) 40 MG tablet Take 1 tablet (40 mg total) by mouth at bedtime.   furosemide (LASIX) 40 MG tablet daily.   losartan (COZAAR) 100 MG tablet Take 100 mg by mouth daily.   metFORMIN (GLUCOPHAGE) 1000 MG tablet Take 1,000 mg by mouth 2 (two) times daily with a meal.   metoprolol succinate (TOPROL-XL) 50 MG 24 hr tablet TAKE 1 TABLET BY MOUTH EVERY DAY WITH OR IMMEDIATELY FOLLOWING A MEAL   pregabalin (LYRICA) 300 MG capsule Take 300 mg by mouth 2 (two) times daily.   rivaroxaban (XARELTO) 20 MG TABS tablet TAKE 1 TABLET BY MOUTH DAILY WITH SUPPER (Patient taking differently: Take 20 mg by mouth at bedtime.)   traMADol (ULTRAM) 50 MG tablet Take 50 mg by mouth every 6 (six) hours as needed.   VUMERITY 231 MG CPDR Take 2 capsules by mouth 2 (two) times daily. 432mg  po bid maintenance.    Physical Exam:    VS:  BP 134/84   Pulse 72   Ht 5' 8.5" (1.74 m)   Wt 227 lb (103 kg)   SpO2 97%   BMI 34.01 kg/m    Wt Readings from Last 3 Encounters:  02/23/24 227 lb (103 kg)  12/31/23 230 lb (104.3 kg)  11/30/23 237 lb (107.5 kg)    GEN: Well nourished, well developed in  no acute distress NECK: No JVD; No carotid bruits CARDIAC: RRR, no murmurs, rubs, gallops RESPIRATORY:  Clear to auscultation without rales, wheezing or rhonchi  ABDOMEN: Soft, non-tender, non-distended EXTREMITIES:  No edema; No acute deformity     Asessement and Plan:.    Shortness of breath/lower extremity edema: Echo in 2022 indicated LVEF of 45 to 50%, global hypokinesis, internal cavity size is moderately dilated, mild concentric LVH, grade 2 diastolic dysfunction.  Patient presented to PCPs office last week with increased lower extremity edema and shortness of breath, started on Lasix 20 mg daily.  BNP was elevated to 734.  Patient reports today that he has had significant improvement in lower extremity edema and breathing, feels this has resolved.  He is to undergo echocardiogram on Friday.  Will continue Lasix 20 mg daily for now.  Patient will follow-up following echocardiogram, discussed that further medication changes may be made following review of his echocardiogram.  Check be met today.  Paroxysmal atrial fibrillation: Patient with history of PAF noted in 2021, patient did not require cardioversion, he self converted.  EKG today indicates sinus rhythm.  Continue metoprolol succinate 50 mg daily and Xarelto 20 mg daily, patient denies any bleeding problems.  Hypertension: Initial blood pressure today 136/92, on recheck was 134/84.  Patient encouraged to monitor his blood pressure at home for the next 2 weeks, blood pressure log provided and salty 6 sheet provided. Continue losartan 100 mg daily and metoprolol succinate 50 mg daily and amlodipine 10 mg daily.  Multiple sclerosis: Followed by neurology.  OSA: Patient reports CPAP compliance.    Disposition: F/u with Reather Littler, NP in 1-2 weeks.   Signed, Rip Harbour, NP

## 2024-02-21 NOTE — Telephone Encounter (Signed)
 [facility] on the line requesting to speak with you

## 2024-02-22 ENCOUNTER — Ambulatory Visit: Admitting: Cardiology

## 2024-02-23 ENCOUNTER — Ambulatory Visit: Attending: Cardiology | Admitting: Cardiology

## 2024-02-23 ENCOUNTER — Encounter: Payer: Self-pay | Admitting: Cardiology

## 2024-02-23 VITALS — BP 134/84 | HR 72 | Ht 68.5 in | Wt 227.0 lb

## 2024-02-23 DIAGNOSIS — G35 Multiple sclerosis: Secondary | ICD-10-CM

## 2024-02-23 DIAGNOSIS — G4730 Sleep apnea, unspecified: Secondary | ICD-10-CM | POA: Diagnosis not present

## 2024-02-23 DIAGNOSIS — I5022 Chronic systolic (congestive) heart failure: Secondary | ICD-10-CM | POA: Diagnosis not present

## 2024-02-23 DIAGNOSIS — R600 Localized edema: Secondary | ICD-10-CM | POA: Diagnosis not present

## 2024-02-23 DIAGNOSIS — I10 Essential (primary) hypertension: Secondary | ICD-10-CM

## 2024-02-23 DIAGNOSIS — I480 Paroxysmal atrial fibrillation: Secondary | ICD-10-CM

## 2024-02-23 LAB — BASIC METABOLIC PANEL WITH GFR
BUN/Creatinine Ratio: 20 (ref 9–20)
BUN: 25 mg/dL — ABNORMAL HIGH (ref 6–24)
CO2: 24 mmol/L (ref 20–29)
Calcium: 9.1 mg/dL (ref 8.7–10.2)
Chloride: 107 mmol/L — ABNORMAL HIGH (ref 96–106)
Creatinine, Ser: 1.24 mg/dL (ref 0.76–1.27)
Glucose: 117 mg/dL — ABNORMAL HIGH (ref 70–99)
Potassium: 4.3 mmol/L (ref 3.5–5.2)
Sodium: 144 mmol/L (ref 134–144)
eGFR: 71 mL/min/{1.73_m2} (ref >59)

## 2024-02-23 NOTE — Patient Instructions (Signed)
 Medication Instructions:  No changes *If you need a refill on your cardiac medications before your next appointment, please call your pharmacy*  Lab Work: BASIC METABOLIC PANEL  If you have labs (blood work) drawn today and your tests are completely normal, you will receive your results only by: MyChart Message (if you have MyChart) OR A paper copy in the mail If you have any lab test that is abnormal or we need to change your treatment, we will call you to review the results.  Testing/Procedures: NONE   Follow-Up: At [facility], you and your health needs are our priority.  As part of our continuing mission to provide you with exceptional heart care, our providers are all part of one team.  This team includes your primary Cardiologist (physician) and Advanced Practice Providers or APPs (Physician Assistants and Nurse Practitioners) who all work together to provide you with the care you need, when you need it.  Your next appointment:   1 -2 week(s)  Provider:   Reather Littler, NP    Then, Donato Schultz, MD will plan to see you again in 3 month(s).    We recommend signing up for the patient portal called "MyChart".  Sign up information is provided on this After Visit Summary.  MyChart is used to connect with patients for Virtual Visits (Telemedicine).  Patients are able to view lab/test results, encounter notes, upcoming appointments, etc.  Non-urgent messages can be sent to your provider as well.   To learn more about what you can do with MyChart, go to [email or web address].   Other Instructions  PLEASE CONTACT THE OFFICE FOR WEIGHT GAIN OF 2-3 POUNDS OVERNIGHT OR 5 POUNDS IN ONE WEEK.        1st Floor: - Lobby - Registration  - Pharmacy  - Lab - Cafe  2nd Floor: - PV Lab - Diagnostic Testing (echo, CT, nuclear med)  3rd Floor: - Vacant  4th Floor: - TCTS (cardiothoracic surgery) - AFib Clinic - Structural Heart Clinic - Vascular Surgery  - Vascular  Ultrasound  5th Floor: - [facility] (general and EP) - Clinical Pharmacy for coumadin, hypertension, lipid, weight-loss medications, and med management appointments    Valet parking services will be available as well.

## 2024-02-24 ENCOUNTER — Encounter: Payer: Self-pay | Admitting: Cardiology

## 2024-02-25 ENCOUNTER — Ambulatory Visit (HOSPITAL_COMMUNITY): Attending: Internal Medicine

## 2024-02-25 ENCOUNTER — Encounter (HOSPITAL_BASED_OUTPATIENT_CLINIC_OR_DEPARTMENT_OTHER): Payer: Self-pay

## 2024-02-25 DIAGNOSIS — E8779 Other fluid overload: Secondary | ICD-10-CM

## 2024-02-25 DIAGNOSIS — I340 Nonrheumatic mitral (valve) insufficiency: Secondary | ICD-10-CM | POA: Diagnosis not present

## 2024-02-25 LAB — ECHOCARDIOGRAM COMPLETE
Area-P 1/2: 4.06 cm2
S' Lateral: 5.7 cm

## 2024-03-01 NOTE — Progress Notes (Unsigned)
 Cardiology Office Note    Date:  03/06/2024  ID:  GIBRAN VESELKA, DOB 02-05-1971, MRN [id number] PCP:  Jearldine Mina, MD  Cardiologist:  Dorothye Gathers, MD  Electrophysiologist:  None   Chief Complaint: HFrEF  History of Present Illness: .    LUNDEN MCLEISH is a 53 y.o. male with visit-pertinent history of atrial fibrillation, diabetes, multiple sclerosis, prior MRI with intercerebral hemorrhages/periventricular white matter changes, chronic demyelinating changes noted on MRI.  Family history of coronary artery disease.  First evaluated by Dr. Renna Cary in 10/2020 for dyspnea on exertion with activity such as pushing things at work.  Patient denied any orthopnea.  Denied edema.  Noted the echocardiogram in 2014 indicated EF 50 to 55%.  Patient was found to be in atrial fibrillation, started on Xarelto 20 mg once daily.  Patient was planned to undergo cardioversion however patient self converted.  Echocardiogram in 11/2020 indicated LVEF of 45 to 50%, LV with mildly decreased function, global hypokinesis, internal cavity size was moderately dilated, mild concentric LVH, grade 2 diastolic dysfunction, RV systolic function was normal, there is mildly elevated pulmonary artery systolic pressures.  Left atria was mildly dilated.  Patient was seen in clinic by Dr. Renna Cary in 04/2022, he remained stable from a cardiac standpoint.  On 02/23/2024 patient was seen in office regarding increased lower extremity edema.  Patient reported he presented to his PCPs office for increased lower extremity to the week prior he also noted increased shortness of breath.  He was started on Lasix 20 mg daily, he reported significant improvement in his lower extremity edema and breathing since starting on Lasix.  He reported that he felt that it overall resolved.  Patient was scheduled to undergo echocardiogram on 4//25.  Echocardiogram indicated LVEF 30 to 35%, no RWMA, LV internal cavity size was moderately dilated, diastolic  parameters were consistent with grade 1 diastolic dysfunction.  RV systolic function was normal, RV mildly enlarged.  Aortic valve regurgitation was moderate, pulmonic valve regurgitation was moderate to severe.  Today patient presents for follow-up.  He reports that he is overall doing well. He denies chest pain, shortness of breath, lower extremity edema, orthopnea or pnd.  He has continued on Lasix 40 mg daily, tolerating well.  Patient notes that he continues to walk his dog regularly and tolerates well.  Patient denies any palpitations or feeling of increased heart rates.  Labwork independently reviewed: 02/23/2024: Sodium 144, potassium 4.3, creatinine 1.24 ROS: .   Today he denies chest pain, shortness of breath, lower extremity edema, fatigue, palpitations, melena, hematuria, hemoptysis, diaphoresis, weakness, presyncope, syncope, orthopnea, and PND.  All other systems are reviewed and otherwise negative. Studies Reviewed: Aaron Aas   EKG:  EKG is not ordered today.  CV Studies: Cardiac studies reviewed are outlined and summarized above. Otherwise please see EMR for full report. Cardiac Studies & Procedures   ______________________________________________________________________________________________     ECHOCARDIOGRAM  ECHOCARDIOGRAM COMPLETE 02/25/2024  Narrative ECHOCARDIOGRAM REPORT    Patient Name:   JAYSON WATERHOUSE Date of Exam: 02/25/2024 Medical Rec #:  [id number]     Height:       68.5 in Accession #:    [id number]    Weight:       227.0 lb Date of Birth:  May 11, 1971     BSA:          2.169 m Patient Age:    52 years      BP:  139/89 mmHg Patient Gender: M             HR:           78 bpm. Exam Location:  [address]  Procedure: 2D Echo, 3D Echo, Cardiac Doppler, Color Doppler and Strain Analysis (Both Spectral and Color Flow Doppler were utilized during procedure).  Indications:    E87.79 Volume overload state of heart  History:        Patient has prior history  of Echocardiogram examinations, most recent 12/06/2020. Stroke, Arrythmias:Atrial Fibrillation, Signs/Symptoms:Shortness of Breath and Edema; Risk Factors:Hypertension, Diabetes, Sleep Apnea and Multiple sclerosis. LVEF 50% PAP 38 mmHg.  Sonographer:    Donnita Gales BA, RDCS Referring Phys: [id number] RONALD POLITE  IMPRESSIONS   1. Left ventricular ejection fraction, by estimation, is 30 to 35%. Left ventricular ejection fraction by 3D volume is 34 %. The left ventricle has moderately decreased function. The left ventricle has no regional wall motion abnormalities. The left ventricular internal cavity size was moderately dilated. Left ventricular diastolic parameters are consistent with Grade I diastolic dysfunction (impaired relaxation). The average left ventricular global longitudinal strain is -7.6 %. The global longitudinal strain is abnormal. 2. Right ventricular systolic function is normal. The right ventricular size is mildly enlarged. 3. The mitral valve is normal in structure. Mild mitral valve regurgitation. No evidence of mitral stenosis. 4. The aortic valve is tricuspid. Aortic valve regurgitation is moderate. No aortic stenosis is present. 5. Pulmonic valve regurgitation is moderate to severe. 6. The inferior vena cava is normal in size with greater than 50% respiratory variability, suggesting right atrial pressure of 3 mmHg.  FINDINGS Left Ventricle: Left ventricular ejection fraction, by estimation, is 30 to 35%. Left ventricular ejection fraction by 3D volume is 34 %. The left ventricle has moderately decreased function. The left ventricle has no regional wall motion abnormalities. The average left ventricular global longitudinal strain is -7.6 %. Strain was performed and the global longitudinal strain is abnormal. The left ventricular internal cavity size was moderately dilated. There is no left ventricular hypertrophy. Left ventricular diastolic parameters are consistent with  Grade I diastolic dysfunction (impaired relaxation).  Right Ventricle: The right ventricular size is mildly enlarged. No increase in right ventricular wall thickness. Right ventricular systolic function is normal.  Left Atrium: Left atrial size was normal in size.  Right Atrium: Right atrial size was normal in size.  Pericardium: There is no evidence of pericardial effusion.  Mitral Valve: The mitral valve is normal in structure. There is mild thickening of the mitral valve leaflet(s). There is mild calcification of the mitral valve leaflet(s). Mild mitral valve regurgitation. No evidence of mitral valve stenosis.  Tricuspid Valve: The tricuspid valve is normal in structure. Tricuspid valve regurgitation is mild . No evidence of tricuspid stenosis.  Aortic Valve: The aortic valve is tricuspid. Aortic valve regurgitation is moderate. No aortic stenosis is present.  Pulmonic Valve: The pulmonic valve was normal in structure. Pulmonic valve regurgitation is moderate to severe. No evidence of pulmonic stenosis.  Aorta: The aortic root is normal in size and structure.  Venous: The inferior vena cava is normal in size with greater than 50% respiratory variability, suggesting right atrial pressure of 3 mmHg.  IAS/Shunts: No atrial level shunt detected by color flow Doppler.  Additional Comments: 3D was performed not requiring image post processing on an independent workstation and was abnormal.   LEFT VENTRICLE PLAX 2D LVIDd:         6.30 cm  Diastology LVIDs:         5.70 cm         LV e' medial:    6.74 cm/s LV PW:         1.20 cm         LV E/e' medial:  19.4 LV IVS:        1.00 cm         LV e' lateral:   11.60 cm/s LVOT diam:     2.40 cm         LV E/e' lateral: 11.3 LV SV:         77 LV SV Index:   35              2D Longitudinal LVOT Area:     4.52 cm        Strain 2D Strain GLS   -8.5 % (A4C): 2D Strain GLS   -3.9 % (A3C): 2D Strain GLS   -10.4 % (A2C): 2D Strain  GLS   -7.6 % Avg:  3D Volume EF LV 3D EF:    Left ventricul ar ejection fraction by 3D volume is 34 %.  3D Volume EF: 3D EF:        34 % LV EDV:       214 ml LV ESV:       142 ml LV SV:        72 ml  RIGHT VENTRICLE            IVC RV Basal diam:  4.40 cm    IVC diam: 2.10 cm RV Mid diam:    3.50 cm RV S prime:     9.36 cm/s TAPSE (M-mode): 2.4 cm RVSP:           63.8 mmHg  LEFT ATRIUM             Index        RIGHT ATRIUM           Index LA diam:        5.50 cm 2.54 cm/m   RA Pressure: 3.00 mmHg LA Vol (A2C):   95.1 ml 43.86 ml/m  RA Area:     15.20 cm LA Vol (A4C):   95.9 ml 44.22 ml/m  RA Volume:   37.10 ml  17.11 ml/m LA Biplane Vol: 99.2 ml 45.75 ml/m AORTIC VALVE             PULMONIC VALVE LVOT Vmax:   99.10 cm/s  PR End Diast Vel: 22.28 msec LVOT Vmean:  64.500 cm/s LVOT VTI:    0.170 m  AORTA Ao Root diam: 3.10 cm Ao Asc diam:  2.70 cm  MITRAL VALVE                TRICUSPID VALVE MV Area (PHT): 4.06 cm     TR Peak grad:   60.8 mmHg MV Decel Time: 187 msec     TR Vmax:        390.00 cm/s MV E velocity: 131.00 cm/s  Estimated RAP:  3.00 mmHg MV A velocity: 41.10 cm/s   RVSP:           63.8 mmHg MV E/A ratio:  3.19 SHUNTS Systemic VTI:  0.17 m Systemic Diam: 2.40 cm  Dorothye Gathers MD Electronically signed by Dorothye Gathers MD Signature Date/Time: 02/25/2024/11:01:29 AM    Final          ______________________________________________________________________________________________       Current Reported  Medications:.    Current Meds  Medication Sig   amLODipine (NORVASC) 10 MG tablet Take 10 mg daily by mouth.   atorvastatin (LIPITOR) 40 MG tablet Take 1 tablet (40 mg total) by mouth at bedtime.   furosemide (LASIX) 40 MG tablet daily.   metFORMIN (GLUCOPHAGE) 1000 MG tablet Take 1,000 mg by mouth 2 (two) times daily with a meal.   metoprolol succinate (TOPROL-XL) 50 MG 24 hr tablet TAKE 1 TABLET BY MOUTH EVERY DAY WITH OR IMMEDIATELY  FOLLOWING A MEAL   metoprolol tartrate (LOPRESSOR) 50 MG tablet Take 1 tablet (50 mg total) by mouth once for 1 dose. 2 hours prior to scan   pregabalin (LYRICA) 300 MG capsule Take 300 mg by mouth 2 (two) times daily.   rivaroxaban (XARELTO) 20 MG TABS tablet TAKE 1 TABLET BY MOUTH DAILY WITH SUPPER (Patient taking differently: Take 20 mg by mouth at bedtime.)   sacubitril-valsartan (ENTRESTO) 24-26 MG Take 1 tablet by mouth 2 (two) times daily.   traMADol (ULTRAM) 50 MG tablet Take 50 mg by mouth every 6 (six) hours as needed.   VUMERITY 231 MG CPDR Take 2 capsules by mouth 2 (two) times daily. 432mg  po bid maintenance.   [DISCONTINUED] losartan (COZAAR) 100 MG tablet Take 100 mg by mouth daily.    Physical Exam:    VS:  BP 130/84 (BP Location: Left Arm, Patient Position: Sitting, Cuff Size: Large)   Pulse 62   Ht 5' 8.5" (1.74 m)   Wt 224 lb 6.4 oz (101.8 kg)   SpO2 98%   BMI 33.62 kg/m    Wt Readings from Last 3 Encounters:  03/06/24 224 lb 6.4 oz (101.8 kg)  02/23/24 227 lb (103 kg)  12/31/23 230 lb (104.3 kg)    GEN: Well nourished, well developed in no acute distress NECK: No JVD; No carotid bruits CARDIAC: RRR, no murmurs, rubs, gallops RESPIRATORY:  Clear to auscultation without rales, wheezing or rhonchi  ABDOMEN: Soft, non-tender, non-distended EXTREMITIES:  No edema; No acute deformity     Asessement and Plan:.    HFrEF: Echo in 2022 indicated LVEF of 45 to 50%, global hypokinesis, internal cavity size moderately dilated, mild concentric LVH, grade 2 diastolic dysfunction.  Patient presented to PCPs office the last week of March reporting increased lower extremity edema and shortness of breath, started on Lasix 20 mg daily, BNP was elevated to 734.  He was started on Lasix 20 mg daily with significant improvement. Echocardiogram on 02/25/24 indicated LVEF 30 to 35%, no RWMA, LV internal cavity size was moderately dilated, diastolic parameters were consistent with grade 1  diastolic dysfunction.  RV systolic function was normal, RV mildly enlarged.  Aortic valve regurgitation was moderate, pulmonic valve regurgitation was moderate to severe.  Today patient reports that he is doing well, he denies any chest pain, shortness of breath lower extremity edema, orthopnea or PND.  Will evaluate for ischemic cause of reduction in EF with a coronary CTA, patient in agreement.  He will take metoprolol tartrate 50 mg prior to procedure.  Check BMET.  Will escalate GDMT, stop losartan and start Entresto 24-26 mg twice daily.  Check BMP in 2 weeks, if stable will plan to start SGLT2 inhibitor.  Continue metoprolol succinate 50 mg daily and Lasix 40 mg daily. Reviewed ED precautions.  Encourage patient to follow low-sodium diet, to monitor daily weights and notify the office of weight gain of 2 to 3 pounds overnight or 5 pounds in a  week.  Paroxysmal atrial fibrillation: Patient with history of PAF noted in 2021, patient did not require cardioversion, he self converted.  He has not had a documented recurrence. EKG at office visit on 4/2 indicated sinus rhythm.  On auscultation today he has regular rate and rhythm.  Patient denies any palpitations or feeling of increased heart rates.  Denies any bleeding problems on Xarelto. Continue metoprolol succinate 50 mg daily and Xarelto 20 mg daily.  Hypertension: Blood pressure today 130/84.  Will escalate GDMT, stop losartan start Entresto 24-26 mg twice daily.  Blood pressure log provided to patient, he will return in 2 weeks.  Check BMET in 2 weeks.  Multiple sclerosis: Followed by neurology.   OSA: Patient reports CPAP compliance.   Disposition: F/u with Kue Fox, NP in four weeks.   Signed, Jolonda Gomm D Rafiq Bucklin, NP

## 2024-03-03 DIAGNOSIS — G4733 Obstructive sleep apnea (adult) (pediatric): Secondary | ICD-10-CM | POA: Diagnosis not present

## 2024-03-06 ENCOUNTER — Encounter: Payer: Self-pay | Admitting: Cardiology

## 2024-03-06 ENCOUNTER — Ambulatory Visit: Attending: Cardiology | Admitting: Cardiology

## 2024-03-06 VITALS — BP 130/84 | HR 62 | Ht 68.5 in | Wt 224.4 lb

## 2024-03-06 DIAGNOSIS — I480 Paroxysmal atrial fibrillation: Secondary | ICD-10-CM | POA: Diagnosis not present

## 2024-03-06 DIAGNOSIS — I5020 Unspecified systolic (congestive) heart failure: Secondary | ICD-10-CM | POA: Diagnosis not present

## 2024-03-06 DIAGNOSIS — I10 Essential (primary) hypertension: Secondary | ICD-10-CM | POA: Diagnosis not present

## 2024-03-06 DIAGNOSIS — G4730 Sleep apnea, unspecified: Secondary | ICD-10-CM | POA: Diagnosis not present

## 2024-03-06 DIAGNOSIS — G35 Multiple sclerosis: Secondary | ICD-10-CM

## 2024-03-06 MED ORDER — METOPROLOL TARTRATE 50 MG PO TABS: 50.0000 mg | ORAL_TABLET | Freq: Once | ORAL | 0 refills | Status: DC

## 2024-03-06 MED ORDER — SACUBITRIL-VALSARTAN 24-26 MG PO TABS: 1.0000 | ORAL_TABLET | Freq: Two times a day (BID) | ORAL | 3 refills | Status: DC

## 2024-03-06 NOTE — Patient Instructions (Addendum)
 Medication Instructions:  Stop Losartan Start Entresto 24-26 mg twice a day  Take Metoprolol tartrate 50 mg once 2 hours prior to scan. *If you need a refill on your cardiac medications before your next appointment, please call your pharmacy*  Lab Work: In 2 weeks we are going to draw a Bmet If you have labs (blood work) drawn today and your tests are completely normal, you will receive your results only by: MyChart Message (if you have MyChart) OR A paper copy in the mail If you have any lab test that is abnormal or we need to change your treatment, we will call you to review the results.  Testing/Procedures:   Your cardiac CT will be scheduled at one of the below locations:   [facility] [address] [place], [place] [address] [phone]  If scheduled at [facility], please arrive at the [facility] and Children's Entrance (Entrance C2) of [facility] 30 minutes prior to test start time. You can use the FREE valet parking offered at entrance C (encouraged to control the heart rate for the test)  Proceed to the [facility] Radiology Department (first floor) to check-in and test prep.    Please follow these instructions carefully (unless otherwise directed):  An IV will be required for this test and Nitroglycerin will be given.  Hold all erectile dysfunction medications at least 3 days (72 hrs) prior to test. (Ie viagra, cialis, sildenafil, tadalafil, etc)   On the Night Before the Test: Be sure to Drink plenty of water. Do not consume any caffeinated/decaffeinated beverages or chocolate 12 hours prior to your test. Do not take any antihistamines 12 hours prior to your test.  On the Day of the Test: Drink plenty of water until 1 hour prior to the test. Do not eat any food 1 hour prior to test. You may take your regular medications prior to the test.  Take metoprolol (Lopressor) two hours prior to test. Hold Furosemide on the morning of the  test. Patients who wear a continuous glucose monitor MUST remove the device prior to scanning.      After the Test: Drink plenty of water. After receiving IV contrast, you may experience a mild flushed feeling. This is normal. On occasion, you may experience a mild rash up to 24 hours after the test. This is not dangerous. If this occurs, you can take Benadryl 25 mg, Zyrtec, Claritin, or Allegra and increase your fluid intake. (Patients taking Tikosyn should avoid Benadryl, and may take Zyrtec, Claritin, or Allegra) If you experience trouble breathing, this can be serious. If it is severe call 911 IMMEDIATELY. If it is mild, please call our office.  We will call to schedule your test 2-4 weeks out understanding that some insurance companies will need an authorization prior to the service being performed.   For more information and frequently asked questions, please visit our website : [email or web address]  For non-scheduling related questions, please contact the cardiac imaging nurse navigator should you have any questions/concerns: Cardiac Imaging Nurse Navigators Direct Office Dial: [phone]   For scheduling needs, including cancellations and rescheduling, please call [place], [phone].   Follow-Up: At [facility], you and your health needs are our priority.  As part of our continuing mission to provide you with exceptional heart care, our providers are all part of one team.  This team includes your primary Cardiologist (physician) and Advanced Practice Providers or APPs (Physician Assistants and Nurse Practitioners) who all work together  to provide you with the care you need, when you need it.  Your next appointment:   1 month(s)  Provider:   Katlyn West, NP  We recommend signing up for the patient portal called "MyChart".  Sign up information is provided on this After Visit Summary.  MyChart is used to connect with patients for Virtual Visits  (Telemedicine).  Patients are able to view lab/test results, encounter notes, upcoming appointments, etc.  Non-urgent messages can be sent to your provider as well.   To learn more about what you can do with MyChart, go to [email or web address].   Other Instructions Notify office if you begin to experience SOB, Lower extremity swelling or a weight gain of 3 lbs over night or 5 lbs over a week. Also notify the office if blood pressure is consistently greater than 150/90.    1st Floor: - Lobby - Registration  - Pharmacy  - Lab - Cafe  2nd Floor: - PV Lab - Diagnostic Testing (echo, CT, nuclear med)  3rd Floor: - Vacant  4th Floor: - TCTS (cardiothoracic surgery) - AFib Clinic - Structural Heart Clinic - Vascular Surgery  - Vascular Ultrasound  5th Floor: - HeartCare Cardiology (general and EP) - Clinical Pharmacy for coumadin, hypertension, lipid, weight-loss medications, and med management appointments    Valet parking services will be available as well.

## 2024-03-08 DIAGNOSIS — G629 Polyneuropathy, unspecified: Secondary | ICD-10-CM | POA: Diagnosis not present

## 2024-03-08 DIAGNOSIS — I5020 Unspecified systolic (congestive) heart failure: Secondary | ICD-10-CM | POA: Diagnosis not present

## 2024-03-08 DIAGNOSIS — E1140 Type 2 diabetes mellitus with diabetic neuropathy, unspecified: Secondary | ICD-10-CM | POA: Diagnosis not present

## 2024-03-08 DIAGNOSIS — I480 Paroxysmal atrial fibrillation: Secondary | ICD-10-CM | POA: Diagnosis not present

## 2024-03-08 DIAGNOSIS — G35 Multiple sclerosis: Secondary | ICD-10-CM | POA: Diagnosis not present

## 2024-03-08 DIAGNOSIS — I10 Essential (primary) hypertension: Secondary | ICD-10-CM | POA: Diagnosis not present

## 2024-03-08 DIAGNOSIS — R269 Unspecified abnormalities of gait and mobility: Secondary | ICD-10-CM | POA: Diagnosis not present

## 2024-03-08 DIAGNOSIS — D6869 Other thrombophilia: Secondary | ICD-10-CM | POA: Diagnosis not present

## 2024-03-09 DIAGNOSIS — E1140 Type 2 diabetes mellitus with diabetic neuropathy, unspecified: Secondary | ICD-10-CM | POA: Diagnosis not present

## 2024-03-10 ENCOUNTER — Encounter (HOSPITAL_COMMUNITY): Payer: Self-pay

## 2024-03-10 ENCOUNTER — Telehealth (HOSPITAL_COMMUNITY): Payer: Self-pay | Admitting: *Deleted

## 2024-03-10 NOTE — Telephone Encounter (Signed)
 Attempted to call patient regarding upcoming cardiac CT appointment. Left message on voicemail with name and callback number Johney Frame RN Navigator Cardiac Imaging [facility] [phone] Office

## 2024-03-13 ENCOUNTER — Ambulatory Visit (HOSPITAL_COMMUNITY)
Admission: RE | Admit: 2024-03-13 | Discharge: 2024-03-13 | Disposition: A | Discharge status: Home / Self Care | Source: Ambulatory Visit | Attending: Cardiology | Admitting: Cardiology

## 2024-03-13 DIAGNOSIS — I5020 Unspecified systolic (congestive) heart failure: Secondary | ICD-10-CM | POA: Insufficient documentation

## 2024-03-13 DIAGNOSIS — G4730 Sleep apnea, unspecified: Secondary | ICD-10-CM | POA: Insufficient documentation

## 2024-03-13 DIAGNOSIS — I10 Essential (primary) hypertension: Secondary | ICD-10-CM | POA: Diagnosis not present

## 2024-03-13 MED ORDER — IOHEXOL 350 MG/ML SOLN
95.0000 mL | Freq: Once | INTRAVENOUS | Status: AC | PRN
  Administered 2024-03-13: 95 mL via INTRAVENOUS

## 2024-03-13 MED ORDER — METOPROLOL TARTRATE 5 MG/5ML IV SOLN
INTRAVENOUS | Status: AC
  Filled 2024-03-13: qty 10

## 2024-03-13 MED ORDER — NITROGLYCERIN 0.4 MG SL SUBL
SUBLINGUAL_TABLET | SUBLINGUAL | Status: AC
  Filled 2024-03-13: qty 2

## 2024-03-13 MED ORDER — DILTIAZEM HCL 25 MG/5ML IV SOLN
INTRAVENOUS | Status: AC
  Filled 2024-03-13: qty 5

## 2024-03-13 MED ORDER — METOPROLOL TARTRATE 5 MG/5ML IV SOLN
10.0000 mg | Freq: Once | INTRAVENOUS | Status: AC | PRN
  Administered 2024-03-13: 5 mg via INTRAVENOUS

## 2024-03-13 MED ORDER — NITROGLYCERIN 0.4 MG SL SUBL
0.8000 mg | SUBLINGUAL_TABLET | Freq: Once | SUBLINGUAL | Status: AC
  Administered 2024-03-13: 0.8 mg via SUBLINGUAL

## 2024-03-13 MED ORDER — DILTIAZEM HCL 25 MG/5ML IV SOLN: 10.0000 mg | INTRAVENOUS | Status: DC | PRN

## 2024-03-15 ENCOUNTER — Telehealth: Payer: Self-pay

## 2024-03-15 NOTE — Telephone Encounter (Signed)
 Called patient advised of below they verbalized understanding No questions at this time.

## 2024-03-15 NOTE — Telephone Encounter (Signed)
-----   Message from Thomas Nixon [place] sent at 03/15/2024 12:42 PM EDT ----- Please let Mr. Plack know that his coronary CTA showed mild nonobstructive coronary artery disease, there is no evidence of plaque that is causing a decreased blood flow to the heart muscle. We will continue to work on prevention measures such as good blood pressure and cholesterol control. Continue current medications and follow up as planned. We can discuss ongoing management at follow up visit in May.

## 2024-03-23 ENCOUNTER — Telehealth: Payer: Self-pay | Admitting: Neurology

## 2024-03-23 MED ORDER — VUMERITY 231 MG PO CPDR: 2.0000 | DELAYED_RELEASE_CAPSULE | Freq: Two times a day (BID) | ORAL | 11 refills | Status: DC

## 2024-03-23 NOTE — Telephone Encounter (Signed)
 refilled

## 2024-03-23 NOTE — Telephone Encounter (Signed)
 Patient request refill for VUMERITY  231 MG CPDR send to  [facility] [phone]

## 2024-03-30 MED ORDER — VUMERITY 231 MG PO CPDR: 2.0000 | DELAYED_RELEASE_CAPSULE | Freq: Two times a day (BID) | ORAL | 11 refills | Status: DC

## 2024-03-30 NOTE — Telephone Encounter (Signed)
 Pt was informed that Pharmacy  has not received his medication refill VUMERITY  231 MG CPDR  .PT has spoke with Pharmacy  . Informed Pt that medication was sent to  [id number]ette Nestle, [place] - [address]   May 1,2025 at 10:05 am

## 2024-03-30 NOTE — Telephone Encounter (Signed)
 refilled

## 2024-03-30 NOTE — Addendum Note (Signed)
 Addended by: Randi Buster on: 03/30/2024 01:33 PM   Modules accepted: Orders

## 2024-04-02 DIAGNOSIS — G4733 Obstructive sleep apnea (adult) (pediatric): Secondary | ICD-10-CM | POA: Diagnosis not present

## 2024-04-03 ENCOUNTER — Telehealth: Payer: Self-pay

## 2024-04-03 NOTE — Telephone Encounter (Signed)
 Called patient advised of below they verbalized understanding Faxed results to PCP

## 2024-04-03 NOTE — Telephone Encounter (Signed)
-----   Message from Ellie Gutta [place] sent at 03/31/2024  2:31 PM EDT ----- Please let Thomas Nixon know that the radiologist has completed the over read of his CT scan. There is evidence of small cysts on his liver. This should be monitored by his PCP, please ensure that the results of his CT are sent to his PCP.

## 2024-04-09 NOTE — Progress Notes (Signed)
 Cardiology Office Note    Date:  04/11/2024  ID:  Thomas Nixon, DOB [birth year], MRN [id number] PCP:  Jearldine Mina, MD  Cardiologist:  Dorothye Gathers, MD  Electrophysiologist:  None   Chief Complaint: Follow up for HFrEF   History of Present Illness: .    Thomas Nixon is a 53 y.o. male with visit-pertinent history of atrial fibrillation, diabetes, multiple sclerosis, prior MRI with intercerebral hemorrhages/periventricular white matter changes, chronic demyelinating changes noted on MRI.  Family history of coronary artery disease.  First evaluated by Dr. Renna Cary in 10/2020 for dyspnea on exertion with activity such as pushing things at work.  Patient denied any orthopnea.  Denied edema.  Noted the echocardiogram in 2014 indicated EF 50 to 55%.  Patient was found to be in atrial fibrillation, started on Xarelto  20 mg once daily.  Patient was planned to undergo cardioversion however patient self converted.  Echocardiogram in 11/2020 indicated LVEF of 45 to 50%, LV with mildly decreased function, global hypokinesis, internal cavity size was moderately dilated, mild concentric LVH, grade 2 diastolic dysfunction, RV systolic function was normal, there is mildly elevated pulmonary artery systolic pressures.  Left atria was mildly dilated.  Patient was seen in clinic by Dr. Renna Cary in 04/2022, he remained stable from a cardiac standpoint.  On 02/23/2024 patient was seen in office regarding increased lower extremity edema.  Patient reported he presented to his PCPs office for increased lower extremity to the week prior he also noted increased shortness of breath.  He was started on Lasix 20 mg daily, he reported significant improvement in his lower extremity edema and breathing since starting on Lasix.  He reported that he felt that it overall resolved.  Patient was scheduled to undergo echocardiogram on 02/25/24.  Echocardiogram indicated LVEF 30 to 35%, no RWMA, LV internal cavity size was moderately dilated,  diastolic parameters were consistent with grade 1 diastolic dysfunction.  RV systolic function was normal, RV mildly enlarged.  Aortic valve regurgitation was moderate, pulmonic valve regurgitation was moderate to severe.  Patient was last seen in clinic on 03/06/24, he had remained stable from cardiac standpoint.  Patient was started on Entresto  24-26 mg twice daily.  He was continued on metoprolol  succinate 50 mg daily and Lasix 40 mg daily.  Coronary CTA on 03/13/2024 indicated a coronary calcium  score of 16, this is 83rd percentile for age, sex and race matched control.  There was minimal nonobstructive CAD.  Today he presents for follow-up with his wife.  He reports that he has been feeling very well.  He denies chest pain, shortness of breath, lower extremity edema, orthopnea or PND.  He reports that he has been walking half a mile at least or up to 30 minutes a day and tolerating well.  He denies any weight gain.  He reports adherence with his medications, notes he is tolerating these well.  On review of his medication list patient notes that he has started on Jardiance 10 mg daily, he is unsure if Dr. Joice Nares started him on this.  He reports that he has not had any recent lab work completed.  ROS: .   Today he denies chest pain, shortness of breath, lower extremity edema, fatigue, palpitations, melena, hematuria, hemoptysis, diaphoresis, weakness, presyncope, syncope, orthopnea, and PND.  All other systems are reviewed and otherwise negative. Studies Reviewed: Aaron Aas   EKG:  EKG is not ordered today.  CV Studies: Cardiac studies reviewed are outlined and summarized above. Otherwise please  see EMR for full report. Cardiac Studies & Procedures   ______________________________________________________________________________________________     ECHOCARDIOGRAM  ECHOCARDIOGRAM COMPLETE 02/25/2024  Narrative ECHOCARDIOGRAM REPORT    Patient Name:   Thomas Nixon Date of Exam: 02/25/2024 Medical Rec #:   [id number]     Height:       68.5 in Accession #:    [id number]    Weight:       227.0 lb Date of Birth:  May 03, 1971     BSA:          2.169 m Patient Age:    52 years      BP:           139/89 mmHg Patient Gender: M             HR:           78 bpm. Exam Location:  [address]  Procedure: 2D Echo, 3D Echo, Cardiac Doppler, Color Doppler and Strain Analysis (Both Spectral and Color Flow Doppler were utilized during procedure).  Indications:    E87.79 Volume overload state of heart  History:        Patient has prior history of Echocardiogram examinations, most recent 12/06/2020. Stroke, Arrythmias:Atrial Fibrillation, Signs/Symptoms:Shortness of Breath and Edema; Risk Factors:Hypertension, Diabetes, Sleep Apnea and Multiple sclerosis. LVEF 50% PAP 38 mmHg.  Sonographer:    Donnita Gales BA, RDCS Referring Phys: [id number] RONALD POLITE  IMPRESSIONS   1. Left ventricular ejection fraction, by estimation, is 30 to 35%. Left ventricular ejection fraction by 3D volume is 34 %. The left ventricle has moderately decreased function. The left ventricle has no regional wall motion abnormalities. The left ventricular internal cavity size was moderately dilated. Left ventricular diastolic parameters are consistent with Grade I diastolic dysfunction (impaired relaxation). The average left ventricular global longitudinal strain is -7.6 %. The global longitudinal strain is abnormal. 2. Right ventricular systolic function is normal. The right ventricular size is mildly enlarged. 3. The mitral valve is normal in structure. Mild mitral valve regurgitation. No evidence of mitral stenosis. 4. The aortic valve is tricuspid. Aortic valve regurgitation is moderate. No aortic stenosis is present. 5. Pulmonic valve regurgitation is moderate to severe. 6. The inferior vena cava is normal in size with greater than 50% respiratory variability, suggesting right atrial pressure of 3 mmHg.  FINDINGS Left Ventricle:  Left ventricular ejection fraction, by estimation, is 30 to 35%. Left ventricular ejection fraction by 3D volume is 34 %. The left ventricle has moderately decreased function. The left ventricle has no regional wall motion abnormalities. The average left ventricular global longitudinal strain is -7.6 %. Strain was performed and the global longitudinal strain is abnormal. The left ventricular internal cavity size was moderately dilated. There is no left ventricular hypertrophy. Left ventricular diastolic parameters are consistent with Grade I diastolic dysfunction (impaired relaxation).  Right Ventricle: The right ventricular size is mildly enlarged. No increase in right ventricular wall thickness. Right ventricular systolic function is normal.  Left Atrium: Left atrial size was normal in size.  Right Atrium: Right atrial size was normal in size.  Pericardium: There is no evidence of pericardial effusion.  Mitral Valve: The mitral valve is normal in structure. There is mild thickening of the mitral valve leaflet(s). There is mild calcification of the mitral valve leaflet(s). Mild mitral valve regurgitation. No evidence of mitral valve stenosis.  Tricuspid Valve: The tricuspid valve is normal in structure. Tricuspid valve regurgitation is mild . No evidence of tricuspid stenosis.  Aortic Valve: The aortic valve is tricuspid. Aortic valve regurgitation is moderate. No aortic stenosis is present.  Pulmonic Valve: The pulmonic valve was normal in structure. Pulmonic valve regurgitation is moderate to severe. No evidence of pulmonic stenosis.  Aorta: The aortic root is normal in size and structure.  Venous: The inferior vena cava is normal in size with greater than 50% respiratory variability, suggesting right atrial pressure of 3 mmHg.  IAS/Shunts: No atrial level shunt detected by color flow Doppler.  Additional Comments: 3D was performed not requiring image post processing on an independent  workstation and was abnormal.   LEFT VENTRICLE PLAX 2D LVIDd:         6.30 cm         Diastology LVIDs:         5.70 cm         LV e' medial:    6.74 cm/s LV PW:         1.20 cm         LV E/e' medial:  19.4 LV IVS:        1.00 cm         LV e' lateral:   11.60 cm/s LVOT diam:     2.40 cm         LV E/e' lateral: 11.3 LV SV:         77 LV SV Index:   35              2D Longitudinal LVOT Area:     4.52 cm        Strain 2D Strain GLS   -8.5 % (A4C): 2D Strain GLS   -3.9 % (A3C): 2D Strain GLS   -10.4 % (A2C): 2D Strain GLS   -7.6 % Avg:  3D Volume EF LV 3D EF:    Left ventricul ar ejection fraction by 3D volume is 34 %.  3D Volume EF: 3D EF:        34 % LV EDV:       214 ml LV ESV:       142 ml LV SV:        72 ml  RIGHT VENTRICLE            IVC RV Basal diam:  4.40 cm    IVC diam: 2.10 cm RV Mid diam:    3.50 cm RV S prime:     9.36 cm/s TAPSE (M-mode): 2.4 cm RVSP:           63.8 mmHg  LEFT ATRIUM             Index        RIGHT ATRIUM           Index LA diam:        5.50 cm 2.54 cm/m   RA Pressure: 3.00 mmHg LA Vol (A2C):   95.1 ml 43.86 ml/m  RA Area:     15.20 cm LA Vol (A4C):   95.9 ml 44.22 ml/m  RA Volume:   37.10 ml  17.11 ml/m LA Biplane Vol: 99.2 ml 45.75 ml/m AORTIC VALVE             PULMONIC VALVE LVOT Vmax:   99.10 cm/s  PR End Diast Vel: 22.28 msec LVOT Vmean:  64.500 cm/s LVOT VTI:    0.170 m  AORTA Ao Root diam: 3.10 cm Ao Asc diam:  2.70 cm  MITRAL VALVE  TRICUSPID VALVE MV Area (PHT): 4.06 cm     TR Peak grad:   60.8 mmHg MV Decel Time: 187 msec     TR Vmax:        390.00 cm/s MV E velocity: 131.00 cm/s  Estimated RAP:  3.00 mmHg MV A velocity: 41.10 cm/s   RVSP:           63.8 mmHg MV E/A ratio:  3.19 SHUNTS Systemic VTI:  0.17 m Systemic Diam: 2.40 cm  Dorothye Gathers MD Electronically signed by Dorothye Gathers MD Signature Date/Time: 02/25/2024/11:01:29 AM    Final      CT SCANS  CT CORONARY MORPH W/CTA  COR W/SCORE 03/13/2024  Addendum 03/23/2024  9:00 PM ADDENDUM REPORT: 03/23/2024 20:58  EXAM: OVER-READ INTERPRETATION  CT CHEST  The following report is an over-read performed by radiologist Dr. Cyndia Drape [facility] on 03/23/2024. This over-read does not include interpretation of cardiac or coronary anatomy or pathology. The coronary CTA interpretation by the cardiologist is attached.  COMPARISON:  None.  FINDINGS: Cardiovascular: See findings discussed in the body of the report. Cardiomegaly  Mediastinum/Nodes: No suspicious adenopathy identified. Imaged mediastinal structures are unremarkable.  Lungs/Pleura: Imaged lungs are clear. No pleural effusion or pneumothorax.  Upper Abdomen: No acute abnormality.  Innumerable hepatic cysts.  Musculoskeletal: No chest wall abnormality. No acute osseous findings.  IMPRESSION: Cardiomegaly. Hepatic cysts.   Electronically Signed By: Sydell Eva M.D. On: 03/23/2024 20:58  Narrative CLINICAL DATA:  53 Year-old Male  EXAM: Cardiac/Coronary  CTA  TECHNIQUE: The patient was scanned on a [employer].  FINDINGS: Axial non-contrast 3 mm slices were carried out through the heart. The data set was analyzed on a dedicated work station and scored using the Agatson method. Gantry rotation speed was 250 msecs and collimation was .6 mm. 0.8 mg of sl NTG was given. The 3D data set was reconstructed in 5% intervals of the 35-75% of the R-R cycle. The patient received 95 cc of contrast.  Coronary Arteries:  Normal coronary origin.  Right dominance.  Coronary Calcium  Score:  Left main: 0  Left anterior descending artery: 16  Left circumflex artery: 0  Right coronary artery: 0  Total: 16  Percentile: 83rd for age, sex, and race matched control.  Left main: The left main is a large caliber vessel with a normal take off from the left coronary cusp that bifurcates to form a left anterior  descending artery and a left circumflex artery. There is no significant plaque.  Left anterior descending artery: The LAD is a large caliber vessel with one large diagonal vessel anatomy. Minimal non-obstructive mixed plaque (1-24%) in proximal LAD. Minimal non-obstructive calcified plaque (1-24%) in mid LAD. Minimal non-obstructive calcified plaque (1-24%) in D1.  Left circumflex artery: The LCX is non-dominant with two obtuse marginal anatomy. Minimal non-obstructive soft plaque (1-24%) in the mid OM1.  Right coronary artery: The RCA is dominant with normal take off from the right coronary cusp. the RCA terminates as a PDA and right posterolateral branch. There is no significant plaque.  Other non-coronary findings:  Right Atrium: Right atrial size is dilated.  Right Ventricle: The right ventricular cavity is dilated.  Left Atrium: Left atrial size is dilated with no left atrial appendage filling defect.  Left Ventricle: Dilated left ventricle with decreased function.  Interatrial septum: No PFO or ASD.  Main Pulmonary Artery: Moderate dilation, 32 mm.  Pulmonary veins: Normal variant pulmonary venous drainage- right middle pulmonary vein.  Pericardium: Normal thickness without significant effusion or calcium  present.  Cardiac valves: The aortic valve is trileaflet without significant calcification. The mitral valve is normal without significant calcification.  Aorta: Normal caliber without significant calcifications.  Extra-cardiac findings: See attached radiology report for non-cardiac structures.  Artifact: Prominent slab artifact  Image quality: Fair  IMPRESSION: 1. Coronary calcium  score of 16. This was 83rd percentile for age, sex, and race matched control.  2. Normal coronary origin with right dominance.  3. CAD-RADS 1. Minimal non-obstructive CAD (1-24%). Consider non-atherosclerotic causes of chest pain. Consider preventive therapy and risk factor  modification.  RECOMMENDATIONS: RECOMMENDATIONS The proposed cut-off value of 1,651 AU yielded a 93 % sensitivity and 75 % specificity in grading AS severity in patients with classical low-flow, low-gradient AS. Proposed different cut-off values to define severe AS for men and women as 2,065 AU and 1,274 AU, respectively. The joint European and American recommendations for the assessment of AS consider the aortic valve calcium  score as a continuum - a very high calcium  score suggests severe AS and a low calcium  score suggests severe AS is unlikely.  Florette Hurry, et al. 2017 ESC/EACTS Guidelines for the management of valvular heart disease. Eur Heart J 2017;38:2739-91.  Coronary artery calcium  (CAC) score is a strong predictor of incident coronary heart disease (CHD) and provides predictive information beyond traditional risk factors. CAC scoring is reasonable to use in the decision to withhold, postpone, or initiate statin therapy in intermediate-risk or selected borderline-risk asymptomatic adults (age 25-75 years and LDL-C >=70 to <190 mg/dL) who do not have diabetes or established atherosclerotic cardiovascular disease (ASCVD).* In intermediate-risk (10-year ASCVD risk >=7.5% to <20%) adults or selected borderline-risk (10-year ASCVD risk >=5% to <7.5%) adults in whom a CAC score is measured for the purpose of making a treatment decision the following recommendations have been made:  If CAC = 0, it is reasonable to withhold statin therapy and reassess in 5 to 10 years, as long as higher risk conditions are absent (diabetes mellitus, family history of premature CHD in first degree relatives (males <55 years; females <65 years), cigarette smoking, LDL >=190 mg/dL or other independent risk factors).  If CAC is 1 to 99, it is reasonable to initiate statin therapy for patients >=44 years of age.  If CAC is >=100 or >=75th percentile, it is reasonable to  initiate statin therapy at any age.  Cardiology referral should be considered for patients with CAC scores =400 or >=75th percentile.  *2018 AHA/ACC/AACVPR/AAPA/ABC/ACPM/ADA/AGS/APhA/ASPC/NLA/PCNA Guideline on the Management of Blood Cholesterol: A Report of the American College of Cardiology/American Heart Association Task Force on Clinical Practice Guidelines. J Am Coll Cardiol. 2019;73(24):3168-3209.  Electronically Signed: By: Gloriann Larger M.D. On: 03/14/2024 13:29     ______________________________________________________________________________________________       Current Reported Medications:.    Current Meds  Medication Sig   amLODipine  (NORVASC ) 10 MG tablet Take 10 mg daily by mouth.   atorvastatin  (LIPITOR) 40 MG tablet Take 1 tablet (40 mg total) by mouth at bedtime.   furosemide (LASIX) 40 MG tablet daily.   JARDIANCE 10 MG TABS tablet Take 10 mg by mouth daily.   metFORMIN  (GLUCOPHAGE ) 1000 MG tablet Take 1,000 mg by mouth 2 (two) times daily with a meal.   metoprolol  succinate (TOPROL -XL) 50 MG 24 hr tablet TAKE 1 TABLET BY MOUTH EVERY DAY WITH OR IMMEDIATELY FOLLOWING A MEAL   pregabalin  (LYRICA ) 300 MG capsule Take 300 mg by mouth 2 (  two) times daily.   rivaroxaban  (XARELTO ) 20 MG TABS tablet TAKE 1 TABLET BY MOUTH DAILY WITH SUPPER (Patient taking differently: Take 20 mg by mouth at bedtime.)   sacubitril -valsartan  (ENTRESTO ) 24-26 MG Take 1 tablet by mouth 2 (two) times daily.   VUMERITY  231 MG CPDR Take 2 capsules by mouth 2 (two) times daily. 432mg  po bid maintenance.   Physical Exam:    VS:  BP 136/82   Pulse 62   Ht 5' 8.5" (1.74 m)   Wt 220 lb (99.8 kg)   SpO2 99%   BMI 32.96 kg/m    Wt Readings from Last 3 Encounters:  04/11/24 220 lb (99.8 kg)  03/06/24 224 lb 6.4 oz (101.8 kg)  02/23/24 227 lb (103 kg)    GEN: Well nourished, well developed in no acute distress NECK: No JVD; No carotid bruits CARDIAC: RRR, no murmurs, rubs,  gallops RESPIRATORY:  Clear to auscultation without rales, wheezing or rhonchi  ABDOMEN: Soft, non-tender, non-distended EXTREMITIES:  No edema; No acute deformity     Asessement and Plan:.    HFrEF: Echo in 2022 indicated LVEF of 40 to 45%, global hypokinesis, internal cavity size moderately dilated, mild concentric LVH, grade 2 diastolic dysfunction.  Patient presented to PCPs office in March reporting increased lower extremity edema and shortness of breath, started on Lasix 20 mg daily, BNP was elevated to 734.  Echo on/4/25 indicated LVEF of 30 to 35%, no RWMA, LV internal cavity size was moderately dilated, diastolic parameters were consistent with grade 1 diastolic dysfunction.  RV systolic function was normal, RV mildly enlarged, aortic valve regurgitation was moderate, pulmonic valve regurgitation was moderate to severe.  He was started on Entresto  24-26 mg twice daily. Today he reports that he has been doing well, denies any weight gain, lower extremity edema, orthopnea or PND.  Patient's blood pressures slightly elevated today, notes similar readings at home will check a BMP today, if stable will plan to start on spironolactone. Continue Entresto  24-26 mg twice daily, Jardiance 10 mg daily, metoprolol  succinate 50 mg daily and Lasix 40 mg daily. Check limited echo in two-three months.   Mild nonobstructive CAD: Coronary CTA on 03/13/2024 indicated a coronary calcium  score of 16, this is 83rd percentile for age, sex and race matched control.  There was minimal nonobstructive CAD. Stable with no anginal symptoms. No indication for ischemic evaluation.  Heart healthy diet and regular cardiovascular exercise encouraged.    Paroxysmal atrial fibrillation: Patient with history of PAF noted in 2021, patient did not require cardioversion, he self converted.  He has not had a documented recurrence. On auscultation today he has regular rate and rhythm.  Patient denies palpitations or feeling of increased  heart rates.  He denies any bleeding from his on Xarelto .  Continue metoprolol  succinate 50 mg daily and Xarelto  20 mg daily.  Hypertension: Blood pressure today 138/92, on recheck was 136/82.  As noted above BMP is stable we will plan to start on spironolactone.  Multiple sclerosis: Followed by neurology.  OSA: Patient reports CPAP compliance.    Disposition: F/u with Dr. Renna Cary in 05/2024 as scheduled.   Signed, Cressie Betzler D Tykeshia Tourangeau, NP

## 2024-04-11 ENCOUNTER — Ambulatory Visit: Attending: Cardiology | Admitting: Cardiology

## 2024-04-11 ENCOUNTER — Encounter: Payer: Self-pay | Admitting: Cardiology

## 2024-04-11 VITALS — BP 136/82 | HR 62 | Ht 68.5 in | Wt 220.0 lb

## 2024-04-11 DIAGNOSIS — I5020 Unspecified systolic (congestive) heart failure: Secondary | ICD-10-CM

## 2024-04-11 DIAGNOSIS — I10 Essential (primary) hypertension: Secondary | ICD-10-CM | POA: Diagnosis not present

## 2024-04-11 DIAGNOSIS — G4730 Sleep apnea, unspecified: Secondary | ICD-10-CM | POA: Diagnosis not present

## 2024-04-11 DIAGNOSIS — I2510 Atherosclerotic heart disease of native coronary artery without angina pectoris: Secondary | ICD-10-CM

## 2024-04-11 DIAGNOSIS — I480 Paroxysmal atrial fibrillation: Secondary | ICD-10-CM | POA: Diagnosis not present

## 2024-04-11 DIAGNOSIS — G35 Multiple sclerosis: Secondary | ICD-10-CM

## 2024-04-11 NOTE — Patient Instructions (Signed)
 Medication Instructions:  No changes *If you need a refill on your cardiac medications before your next appointment, please call your pharmacy*  Lab Work: Today we are going to get a Bmet If you have labs (blood work) drawn today and your tests are completely normal, you will receive your results only by: MyChart Message (if you have MyChart) OR A paper copy in the mail If you have any lab test that is abnormal or we need to change your treatment, we will call you to review the results.  Testing/Procedures: Your physician has requested that you have an limited echocardiogram. Echocardiography is a painless test that uses sound waves to create images of your heart. It provides your doctor with information about the size and shape of your heart and how well your heart's chambers and valves are working. This procedure takes approximately one hour. There are no restrictions for this procedure. Please do NOT wear cologne, perfume, aftershave, or lotions (deodorant is allowed). Please arrive 15 minutes prior to your appointment time.  Please note: We ask at that you not bring children with you during ultrasound (echo/ vascular) testing. Due to room size and safety concerns, children are not allowed in the ultrasound rooms during exams. Our front office staff cannot provide observation of children in our lobby area while testing is being conducted. An adult accompanying a patient to their appointment will only be allowed in the ultrasound room at the discretion of the ultrasound technician under special circumstances. We apologize for any inconvenience.   Follow-Up: At [facility], you and your health needs are our priority.  As part of our continuing mission to provide you with exceptional heart care, our providers are all part of one team.  This team includes your primary Cardiologist (physician) and Advanced Practice Providers or APPs (Physician Assistants and Nurse Practitioners) who all  work together to provide you with the care you need, when you need it.  Your next appointment:   Already scheduled  We recommend signing up for the patient portal called "MyChart".  Sign up information is provided on this After Visit Summary.  MyChart is used to connect with patients for Virtual Visits (Telemedicine).  Patients are able to view lab/test results, encounter notes, upcoming appointments, etc.  Non-urgent messages can be sent to your provider as well.   To learn more about what you can do with MyChart, go to [email or web address].

## 2024-04-12 ENCOUNTER — Ambulatory Visit: Payer: Self-pay | Admitting: Cardiology

## 2024-04-12 DIAGNOSIS — I2510 Atherosclerotic heart disease of native coronary artery without angina pectoris: Secondary | ICD-10-CM

## 2024-04-12 DIAGNOSIS — I480 Paroxysmal atrial fibrillation: Secondary | ICD-10-CM

## 2024-04-12 DIAGNOSIS — I10 Essential (primary) hypertension: Secondary | ICD-10-CM

## 2024-04-12 DIAGNOSIS — I5020 Unspecified systolic (congestive) heart failure: Secondary | ICD-10-CM

## 2024-04-12 LAB — BASIC METABOLIC PANEL WITH GFR
BUN/Creatinine Ratio: 22 — ABNORMAL HIGH (ref 9–20)
BUN: 23 mg/dL (ref 6–24)
CO2: 22 mmol/L (ref 20–29)
Calcium: 9.1 mg/dL (ref 8.7–10.2)
Chloride: 104 mmol/L (ref 96–106)
Creatinine, Ser: 1.04 mg/dL (ref 0.76–1.27)
Glucose: 106 mg/dL — ABNORMAL HIGH (ref 70–99)
Potassium: 4.1 mmol/L (ref 3.5–5.2)
Sodium: 142 mmol/L (ref 134–144)
eGFR: 87 mL/min/{1.73_m2} (ref >59)

## 2024-04-12 MED ORDER — FUROSEMIDE 40 MG PO TABS: 40.0000 mg | ORAL_TABLET | ORAL | 2 refills | Status: AC | PRN

## 2024-04-12 MED ORDER — SPIRONOLACTONE 25 MG PO TABS: 25.0000 mg | ORAL_TABLET | Freq: Every day | ORAL | 3 refills | Status: AC

## 2024-04-12 NOTE — Telephone Encounter (Signed)
-----   Message from Ellie Gutta [place] sent at 04/12/2024  1:01 PM EDT ----- Please let Thomas Nixon know that his kidney function is normal and his electrolytes are normal.  Lets start spironolactone 25 mg daily with a repeat BMET in two weeks. Please remind him to monitor his blood pressure at home and notify the office if consistently elevated above 130/80. Also recommend stopping lasix and using as needed for increased lower extremity edema, increased shortness of breath, weight gain of 2-3 lbs overnight or 5 lbs in a week.

## 2024-04-12 NOTE — Telephone Encounter (Signed)
 Called patient advised of below they verbalized understanding. Ordered labs and sent medication to the pharmacy

## 2024-04-14 ENCOUNTER — Other Ambulatory Visit: Payer: Self-pay | Admitting: Cardiology

## 2024-04-14 DIAGNOSIS — I4891 Unspecified atrial fibrillation: Secondary | ICD-10-CM

## 2024-04-18 DIAGNOSIS — D649 Anemia, unspecified: Secondary | ICD-10-CM | POA: Diagnosis not present

## 2024-04-18 DIAGNOSIS — Z125 Encounter for screening for malignant neoplasm of prostate: Secondary | ICD-10-CM | POA: Diagnosis not present

## 2024-04-18 DIAGNOSIS — Z1331 Encounter for screening for depression: Secondary | ICD-10-CM | POA: Diagnosis not present

## 2024-04-18 DIAGNOSIS — G4733 Obstructive sleep apnea (adult) (pediatric): Secondary | ICD-10-CM | POA: Diagnosis not present

## 2024-04-18 DIAGNOSIS — Z0000 Encounter for general adult medical examination without abnormal findings: Secondary | ICD-10-CM | POA: Diagnosis not present

## 2024-04-18 DIAGNOSIS — I10 Essential (primary) hypertension: Secondary | ICD-10-CM | POA: Diagnosis not present

## 2024-04-18 DIAGNOSIS — G35 Multiple sclerosis: Secondary | ICD-10-CM | POA: Diagnosis not present

## 2024-04-18 DIAGNOSIS — E1140 Type 2 diabetes mellitus with diabetic neuropathy, unspecified: Secondary | ICD-10-CM | POA: Diagnosis not present

## 2024-04-18 DIAGNOSIS — Z23 Encounter for immunization: Secondary | ICD-10-CM | POA: Diagnosis not present

## 2024-04-18 DIAGNOSIS — I5020 Unspecified systolic (congestive) heart failure: Secondary | ICD-10-CM | POA: Diagnosis not present

## 2024-04-18 DIAGNOSIS — D6869 Other thrombophilia: Secondary | ICD-10-CM | POA: Diagnosis not present

## 2024-04-18 DIAGNOSIS — E7800 Pure hypercholesterolemia, unspecified: Secondary | ICD-10-CM | POA: Diagnosis not present

## 2024-04-18 DIAGNOSIS — I480 Paroxysmal atrial fibrillation: Secondary | ICD-10-CM | POA: Diagnosis not present

## 2024-04-18 NOTE — Telephone Encounter (Signed)
 Prescription refill request for Xarelto  received.  Indication: Afib  Last office visit:04/11/24 Thomas Nixon)  Weight: 99.8kg Age: 53 Scr: 1.04 (04/12/24)  CrCl: 119.67ml/min  Appropriate dose. Refill sent.

## 2024-05-03 DIAGNOSIS — G4733 Obstructive sleep apnea (adult) (pediatric): Secondary | ICD-10-CM | POA: Diagnosis not present

## 2024-05-09 ENCOUNTER — Telehealth: Payer: Self-pay | Admitting: Neurology

## 2024-05-09 MED ORDER — VUMERITY 231 MG PO CPDR: 2.0000 | DELAYED_RELEASE_CAPSULE | Freq: Two times a day (BID) | ORAL | 11 refills | Status: DC

## 2024-05-09 NOTE — Telephone Encounter (Signed)
 Refilled as asked

## 2024-05-09 NOTE — Telephone Encounter (Signed)
 Pt is requesting a refill for Diroximel Fumarate  (VUMERITY ) 231 MG CPDR.  Pharmacy: [phone]

## 2024-06-02 DIAGNOSIS — G4733 Obstructive sleep apnea (adult) (pediatric): Secondary | ICD-10-CM | POA: Diagnosis not present

## 2024-06-08 ENCOUNTER — Ambulatory Visit (HOSPITAL_COMMUNITY): Attending: Cardiology

## 2024-06-15 ENCOUNTER — Ambulatory Visit: Attending: Cardiology | Admitting: Cardiology

## 2024-06-15 ENCOUNTER — Telehealth: Payer: Self-pay | Admitting: Neurology

## 2024-06-15 ENCOUNTER — Encounter: Payer: Self-pay | Admitting: Cardiology

## 2024-06-15 ENCOUNTER — Other Ambulatory Visit (HOSPITAL_COMMUNITY): Payer: Self-pay

## 2024-06-15 VITALS — BP 130/86 | HR 72 | Ht 69.0 in | Wt 213.0 lb

## 2024-06-15 DIAGNOSIS — I2510 Atherosclerotic heart disease of native coronary artery without angina pectoris: Secondary | ICD-10-CM | POA: Diagnosis not present

## 2024-06-15 DIAGNOSIS — I480 Paroxysmal atrial fibrillation: Secondary | ICD-10-CM

## 2024-06-15 DIAGNOSIS — I5020 Unspecified systolic (congestive) heart failure: Secondary | ICD-10-CM

## 2024-06-15 MED ORDER — VUMERITY 231 MG PO CPDR: 2.0000 | DELAYED_RELEASE_CAPSULE | Freq: Two times a day (BID) | ORAL | 11 refills | Status: DC

## 2024-06-15 MED ORDER — ATORVASTATIN CALCIUM 40 MG PO TABS
40.0000 mg | ORAL_TABLET | Freq: Every day | ORAL | 3 refills | Status: AC
  Filled 2024-06-15: qty 90, 90d supply, fill #0

## 2024-06-15 NOTE — Telephone Encounter (Signed)
 Sent refill

## 2024-06-15 NOTE — Patient Instructions (Signed)
 Medication Instructions:  The current medical regimen is effective;  continue present plan and medications.  *If you need a refill on your cardiac medications before your next appointment, please call your pharmacy*  Follow-Up: At [facility], you and your health needs are our priority.  As part of our continuing mission to provide you with exceptional heart care, our providers are all part of one team.  This team includes your primary Cardiologist (physician) and Advanced Practice Providers or APPs (Physician Assistants and Nurse Practitioners) who all work together to provide you with the care you need, when you need it.  Your next appointment:   6 month(s)  Provider:   One of our Advanced Practice Providers (APPs): Melita Springer, PA-C  Friddie Jetty, NP Evaline Hill, NP  Theotis Flake, PA-C Lawana Pray, NP  Willis Harter, PA-C Lovette Rud, PA-C  Luther, PA-C Ernest Dick, NP  Marlana Silvan, NP Marcie Sever, PA-C  Laquita Plant, PA-C    Dayna Dunn, PA-C  Scott Weaver, PA-C Palmer Bobo, NP Katlyn West, NP Callie Goodrich, PA-C  Evan Williams, PA-C Sheng Haley, PA-C  Xika Zhao, NP Kathleen Johnson, PA-C       We recommend signing up for the patient portal called "MyChart".  Sign up information is provided on this After Visit Summary.  MyChart is used to connect with patients for Virtual Visits (Telemedicine).  Patients are able to view lab/test results, encounter notes, upcoming appointments, etc.  Non-urgent messages can be sent to your provider as well.   To learn more about what you can do with MyChart, go to [email or web address].

## 2024-06-15 NOTE — Telephone Encounter (Signed)
 Pt called needing a refill on his VUMERITY  231 MG CPDR and is needing it sent to the [facility] on [address].
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
## 2024-06-20 ENCOUNTER — Ambulatory Visit (HOSPITAL_COMMUNITY)
Admission: RE | Admit: 2024-06-20 | Discharge: 2024-06-20 | Disposition: A | Discharge status: Home / Self Care | Source: Ambulatory Visit | Attending: Cardiology | Admitting: Cardiology

## 2024-06-20 DIAGNOSIS — I10 Essential (primary) hypertension: Secondary | ICD-10-CM | POA: Diagnosis not present

## 2024-06-20 DIAGNOSIS — I371 Nonrheumatic pulmonary valve insufficiency: Secondary | ICD-10-CM | POA: Insufficient documentation

## 2024-06-20 DIAGNOSIS — I110 Hypertensive heart disease with heart failure: Secondary | ICD-10-CM | POA: Diagnosis not present

## 2024-06-20 DIAGNOSIS — I5020 Unspecified systolic (congestive) heart failure: Secondary | ICD-10-CM | POA: Diagnosis not present

## 2024-06-20 LAB — ECHOCARDIOGRAM LIMITED
Area-P 1/2: 4.8 cm2
Calc EF: 44.9 %
S' Lateral: 4.6 cm
Single Plane A2C EF: 42.9 %
Single Plane A4C EF: 47.2 %

## 2024-06-26 NOTE — Telephone Encounter (Signed)
-----   Message from Katlyn D [place] sent at 06/26/2024  5:14 PM EDT ----- Called and reviewed results with patient, on GDMT LVEF remains 30 to 35%.  Patient reports that he is doing well, denies any increased lower extremity edema, orthopnea or PND.  He denies shortness of  breath or chest pain.  Discussed with Dr. Jeffrie, recommended referral to EP team to discuss ICD.  Would also recommend referral to advanced heart failure clinic given persistently reduced EF on  GDMT. ----- Message ----- From: Interface, [employer] One Seven Sent: 06/20/2024   9:57 PM EDT To: Katlyn D West, NP

## 2024-06-26 NOTE — Addendum Note (Signed)
 Addended by: BYRON LEONTINE RAMAN on: 06/26/2024 05:31 PM   Modules accepted: Orders

## 2024-06-30 ENCOUNTER — Ambulatory Visit: Attending: Internal Medicine | Admitting: Internal Medicine

## 2024-06-30 ENCOUNTER — Encounter: Payer: Self-pay | Admitting: Internal Medicine

## 2024-06-30 VITALS — BP 146/84 | HR 88 | Ht 70.0 in | Wt 216.0 lb

## 2024-06-30 DIAGNOSIS — I5022 Chronic systolic (congestive) heart failure: Secondary | ICD-10-CM | POA: Diagnosis not present

## 2024-06-30 DIAGNOSIS — G4730 Sleep apnea, unspecified: Secondary | ICD-10-CM | POA: Diagnosis not present

## 2024-06-30 NOTE — Patient Instructions (Signed)
 Medication Instructions:  Your physician recommends that you continue on your current medications as directed. Please refer to the Current Medication list given to you today.  *If you need a refill on your cardiac medications before your next appointment, please call your pharmacy*  Lab Work: 30 days prior to procedure  You may go to any [facility] Location for your lab work:  [facility] - [address] ([facility]) - [address] [phone] [address]  [place] - [address]   [place]  - [address]   [place] - [address] - [address] [employer]  - [address] [place] [address] ([facility]   If you have labs (blood work) drawn today and your tests are completely normal, you will receive your results only by: [employer] (if you have MyChart)  If you have any lab test that is abnormal or we need to change your treatment, we will call you or send a MyChart message to review the results.  Testing/Procedures: ICD to be scheduled for Oct 1.   Follow-Up: At [facility], you and your health needs are our priority.  As part of our continuing mission to provide you with exceptional heart care, we have created designated Provider Care Teams.  These Care Teams include your primary Cardiologist (physician) and Advanced Practice Providers (APPs -  Physician Assistants and Nurse Practitioners) who all work together to provide you with the care you need, when you need it.  We recommend signing up for the patient portal called MyChart.  Sign up information is provided on this After Visit Summary.  MyChart is used to connect with patients for Virtual Visits (Telemedicine).  Patients are able to view lab/test results, encounter notes, upcoming appointments, etc.  Non-urgent messages can be sent to your provider as well.   To learn more about what you can do with  MyChart, go to [email or web address].    Your next appointment:   To be scheduled

## 2024-06-30 NOTE — Progress Notes (Signed)
 HPI Thomas Nixon is referred today by Katlyn West, NP for consideration for ICD insertion. He is a pleasant 53 yo man with a h/o non-ischemic CM and chronic systolic heart failure, EF 30-35%. He has not had syncope but note occasional dizzy spells and some lower extremity swelling. Evaluation to date demonstrates a calcium  score of 16. He has a h/o PAF and has been on xarelto . He has been treated with GDMT and not had an improvement in his EF.  Allergies  Allergen Reactions   Pork-Derived Products Other (See Comments)    Diet restriction     Current Outpatient Medications  Medication Sig Dispense Refill   amLODipine  (NORVASC ) 10 MG tablet Take 10 mg daily by mouth.  3   atorvastatin  (LIPITOR) 40 MG tablet Take 1 tablet (40 mg total) by mouth at bedtime. 90 tablet 3   furosemide  (LASIX ) 40 MG tablet Take 1 tablet (40 mg total) by mouth as needed (for increased lower extremity edema, increased shortness of breath, weight gain of 2-3 lbs overnight or 5 lbs in a week.). 25 tablet 2   JARDIANCE 10 MG TABS tablet Take 10 mg by mouth daily.     metFORMIN  (GLUCOPHAGE ) 1000 MG tablet Take 1,000 mg by mouth 2 (two) times daily with a meal.     metoprolol  succinate (TOPROL -XL) 50 MG 24 hr tablet TAKE 1 TABLET BY MOUTH EVERY DAY WITH OR IMMEDIATELY FOLLOWING A MEAL 90 tablet 3   metoprolol  tartrate (LOPRESSOR ) 50 MG tablet Take 1 tablet (50 mg total) by mouth once for 1 dose. 2 hours prior to scan 1 tablet 0   pregabalin  (LYRICA ) 300 MG capsule Take 300 mg by mouth 2 (two) times daily.     rivaroxaban  (XARELTO ) 20 MG TABS tablet TAKE 1 TABLET BY MOUTH DAILY WITH SUPPER 90 tablet 1   sacubitril -valsartan  (ENTRESTO ) 24-26 MG Take 1 tablet by mouth 2 (two) times daily. 180 tablet 3   spironolactone  (ALDACTONE ) 25 MG tablet Take 1 tablet (25 mg total) by mouth daily. 90 tablet 3   traMADol (ULTRAM) 50 MG tablet Take 50 mg by mouth every 6 (six) hours as needed.     VUMERITY  231 MG CPDR Take 2 capsules  by mouth 2 (two) times daily. 432mg  po bid maintenance. 120 capsule 11   No current facility-administered medications for this visit.     Past Medical History:  Diagnosis Date   CVA (cerebral infarction)    Diabetes mellitus without complication (HCC)    Type 2   Encounter for long-term (current) use of other medications    Headache(784.0)    Hypertension    MS (multiple sclerosis) (HCC)    possible   Neuropathy    Other and unspecified hyperlipidemia    Stroke (HCC)    5 strokes   Thoracic or lumbosacral neuritis or radiculitis, unspecified    Unspecified late effects of cerebrovascular disease    Unspecified sleep apnea    does not use cpap    ROS:   All systems reviewed and negative except as noted in the HPI.   Past Surgical History:  Procedure Laterality Date   ANTERIOR LAT LUMBAR FUSION N/A 08/30/2014   Procedure: ANTERIOR LATERAL LUMBAR FUSION 1 LEVEL;  Surgeon: Oneil Rodgers Priestly, MD;  Location: [facility] OR;  Service: Orthopedics;  Laterality: N/A;  Lumbar 3-4 lateral interbody fusion with instrumentation and allograft   BRAIN BIOPSY       Family History  Problem Relation Age  of Onset   High blood pressure Mother    Diabetes Mother    High blood pressure Father    Stroke Maternal Grandmother    Stroke Paternal Grandmother      Social History   Socioeconomic History   Marital status: Married    Spouse name: Montie   Number of children: 3   Years of education: college   Highest education level: Not on file  Occupational History    Comment: Disabled  Tobacco Use   Smoking status: Former    Types: Cigars   Smokeless tobacco: Never   Tobacco comments:    04/11/2024 Patient stopped smoking a couple months ago  Vaping Use   Vaping status: Never Used  Substance and Sexual Activity   Alcohol use: Yes    Comment: Once per month.   Drug use: No   Sexual activity: Not on file  Other Topics Concern   Not on file  Social History Narrative   Patient is  married and lives at home with his wife Montie .   Disabled.   Education [employer].   Right handed.   1 cup caffeine per day.   Social Drivers of Corporate investment banker Strain: Not on file  Food Insecurity: No Food Insecurity (12/23/2022)   Hunger Vital Sign    Worried About Running Out of Food in the Last Year: Never true    Ran Out of Food in the Last Year: Never true  Transportation Needs: No Transportation Needs (12/23/2022)   PRAPARE - Administrator, Civil Service (Medical): No    Lack of Transportation (Non-Medical): No  Physical Activity: Not on file  Stress: Not on file  Social Connections: Not on file  Intimate Partner Violence: Not on file     BP (!) 146/84   Pulse 88   Ht 5' 10 (1.778 m)   Wt 216 lb (98 kg)   SpO2 95%   BMI 30.99 kg/m   Physical Exam:  Well appearing 53 yo man, NAD HEENT: Unremarkable Neck:  No JVD, no thyromegally Lymphatics:  No adenopathy Back:  No CVA tenderness Lungs:  Clear with no wheezes HEART:  Regular rate rhythm, no murmurs, no rubs, no clicks Abd:  soft, positive bowel sounds, no organomegally, no rebound, no guarding Ext:  2 plus pulses, no edema, no cyanosis, no clubbing Skin:  No rashes no nodules Neuro:  CN II through XII intact, motor grossly intact  EKG - nsr   Assess/Plan: Chronic systolic heart failure - his EF has been 30-35% for several months despite GDMT and I have discussed the indications/risks/benefits/goals/expectations of ICD insertion and recommend proceeding.   Thomas Kaled Allende,MD

## 2024-07-03 DIAGNOSIS — G4733 Obstructive sleep apnea (adult) (pediatric): Secondary | ICD-10-CM | POA: Diagnosis not present

## 2024-07-07 DIAGNOSIS — D126 Benign neoplasm of colon, unspecified: Secondary | ICD-10-CM | POA: Diagnosis not present

## 2024-07-07 DIAGNOSIS — I4891 Unspecified atrial fibrillation: Secondary | ICD-10-CM | POA: Diagnosis not present

## 2024-07-07 DIAGNOSIS — I509 Heart failure, unspecified: Secondary | ICD-10-CM | POA: Diagnosis not present

## 2024-07-07 DIAGNOSIS — Z7901 Long term (current) use of anticoagulants: Secondary | ICD-10-CM | POA: Diagnosis not present

## 2024-07-07 DIAGNOSIS — Z86018 Personal history of other benign neoplasm: Secondary | ICD-10-CM | POA: Diagnosis not present

## 2024-07-07 DIAGNOSIS — Z8673 Personal history of transient ischemic attack (TIA), and cerebral infarction without residual deficits: Secondary | ICD-10-CM | POA: Diagnosis not present

## 2024-07-24 ENCOUNTER — Other Ambulatory Visit: Payer: Self-pay

## 2024-07-24 DIAGNOSIS — I5022 Chronic systolic (congestive) heart failure: Secondary | ICD-10-CM

## 2024-07-26 ENCOUNTER — Telehealth (HOSPITAL_COMMUNITY): Payer: Self-pay

## 2024-07-26 NOTE — Telephone Encounter (Addendum)
 Spoke with patient to complete pre-procedure call.     Health status review:  Any new medical conditions, recent signs of acute illness or been started on antibiotics? No Any recent hospitalizations or surgeries? No Any new medications started since pre-op visit? No  Follow all medication instructions prior to procedure or the procedure may be rescheduled:    HOLD: Empagliflozin (Jardiance) for 3 days prior to the procedure. Last dose on Saturday, September 27. HOLD: Xarelto  (Rivaroxaban ) for 2 days) prior to your procedure. Your last dose will be Sunday, September 28, PM dose.  On the morning of your procedure hold Spironolactone , Lasix  and Metformin . Take all other medications not discussed with a sip of water . The night before your procedure and the morning of your procedure, wash thoroughly with the CHG surgical soap from the neck down, paying special attention to the area where your procedure will be performed.  Nothing to eat or drink after midnight prior to your procedure.  Pre-procedure testing scheduled: lab work by September 16.  Confirmed patient is scheduled for Implantable cardioverter defibrilator (ICD) on Wednesday, October 1 with Dr. Danelle Birmingham. Instructed patient to arrive at the Main Entrance A at [facility]: [address] [place], [place] [address] and check in at Admitting at 6:30 AM.  Advised of plan to go home the same day and will only stay overnight if medically necessary. You MUST have a responsible adult to drive you home and MUST be with you the first 24 hours after you arrive home or your procedure could be cancelled.  Informed patient a nurse will call a day before the procedure to confirm arrival time and ensure instructions are followed.  Patient verbalized understanding to information provided and is agreeable to proceed with procedure.   Advised patient to contact RN Navigator at [phone], to inform of any new medications started after call or  concerns prior to procedure.

## 2024-07-26 NOTE — Telephone Encounter (Signed)
 Attempted to reach patient to discuss upcoming procedure, no answer. Left VM for patient to return call.

## 2024-07-31 DIAGNOSIS — I5022 Chronic systolic (congestive) heart failure: Secondary | ICD-10-CM | POA: Diagnosis not present

## 2024-07-31 LAB — CBC

## 2024-08-01 LAB — BASIC METABOLIC PANEL WITH GFR
BUN/Creatinine Ratio: 24 — ABNORMAL HIGH (ref 9–20)
BUN: 27 mg/dL — ABNORMAL HIGH (ref 6–24)
CO2: 21 mmol/L (ref 20–29)
Calcium: 9 mg/dL (ref 8.7–10.2)
Chloride: 103 mmol/L (ref 96–106)
Creatinine, Ser: 1.13 mg/dL (ref 0.76–1.27)
Glucose: 95 mg/dL (ref 70–99)
Potassium: 4.2 mmol/L (ref 3.5–5.2)
Sodium: 141 mmol/L (ref 134–144)
eGFR: 79 mL/min/1.73 (ref >59)

## 2024-08-01 LAB — CBC
Hematocrit: 45.1 (ref 37.5–51.0)
Hemoglobin: 14.2 g/dL (ref 13.0–17.7)
MCH: 27.7 pg (ref 26.6–33.0)
MCHC: 31.5 g/dL (ref 31.5–35.7)
MCV: 88 fL (ref 79–97)
Platelets: 225 x10E3/uL (ref 150–450)
RBC: 5.13 x10E6/uL (ref 4.14–5.80)
RDW: 14.4 (ref 11.6–15.4)
WBC: 5.5 x10E3/uL (ref 3.4–10.8)

## 2024-08-03 DIAGNOSIS — G4733 Obstructive sleep apnea (adult) (pediatric): Secondary | ICD-10-CM | POA: Diagnosis not present

## 2024-08-22 NOTE — Pre-Procedure Instructions (Signed)
 Attempted to call patient regarding procedure instructions.  Left voicemail on the following items: Arrival time 0830 Nothing to eat or drink after midnight No meds AM of procedure Responsible person to drive you home and stay with you for 24 hrs Wash with special soap night before and morning of procedure If on anti-coagulant drug instructions Xarelto - last dose should have been 9/28

## 2024-08-23 ENCOUNTER — Ambulatory Visit (HOSPITAL_COMMUNITY)

## 2024-08-23 ENCOUNTER — Other Ambulatory Visit: Payer: Self-pay

## 2024-08-23 ENCOUNTER — Ambulatory Visit (HOSPITAL_COMMUNITY)
Admission: RE | Disposition: A | Discharge status: Home / Self Care | Payer: Self-pay | Source: Home / Self Care | Attending: Internal Medicine

## 2024-08-23 ENCOUNTER — Ambulatory Visit (HOSPITAL_COMMUNITY)
Admission: RE | Admit: 2024-08-23 | Discharge: 2024-08-23 | Disposition: A | Discharge status: Home / Self Care | Attending: Internal Medicine | Admitting: Internal Medicine

## 2024-08-23 DIAGNOSIS — I428 Other cardiomyopathies: Secondary | ICD-10-CM | POA: Diagnosis not present

## 2024-08-23 DIAGNOSIS — I5022 Chronic systolic (congestive) heart failure: Secondary | ICD-10-CM | POA: Insufficient documentation

## 2024-08-23 DIAGNOSIS — I517 Cardiomegaly: Secondary | ICD-10-CM | POA: Diagnosis not present

## 2024-08-23 DIAGNOSIS — I110 Hypertensive heart disease with heart failure: Secondary | ICD-10-CM | POA: Diagnosis not present

## 2024-08-23 DIAGNOSIS — I429 Cardiomyopathy, unspecified: Secondary | ICD-10-CM

## 2024-08-23 DIAGNOSIS — Z79899 Other long term (current) drug therapy: Secondary | ICD-10-CM | POA: Diagnosis not present

## 2024-08-23 DIAGNOSIS — Z7901 Long term (current) use of anticoagulants: Secondary | ICD-10-CM | POA: Insufficient documentation

## 2024-08-23 DIAGNOSIS — Z87891 Personal history of nicotine dependence: Secondary | ICD-10-CM | POA: Diagnosis not present

## 2024-08-23 DIAGNOSIS — Z95810 Presence of automatic (implantable) cardiac defibrillator: Secondary | ICD-10-CM | POA: Diagnosis not present

## 2024-08-23 DIAGNOSIS — I480 Paroxysmal atrial fibrillation: Secondary | ICD-10-CM | POA: Insufficient documentation

## 2024-08-23 HISTORY — PX: ICD IMPLANT: EP1208

## 2024-08-23 LAB — GLUCOSE, CAPILLARY: Glucose-Capillary: 128 mg/dL — ABNORMAL HIGH (ref 70–99)

## 2024-08-23 SURGERY — ICD IMPLANT
Anesthesia: LOCAL

## 2024-08-23 MED ORDER — CEFAZOLIN SODIUM-DEXTROSE 2-4 GM/100ML-% IV SOLN
INTRAVENOUS | Status: AC
  Filled 2024-08-23: qty 100

## 2024-08-23 MED ORDER — SODIUM CHLORIDE 0.9 % IV SOLN: INTRAVENOUS | Status: DC

## 2024-08-23 MED ORDER — MIDAZOLAM HCL 5 MG/5ML IJ SOLN
INTRAMUSCULAR | Status: DC | PRN
  Administered 2024-08-23 (×4): 1 mg via INTRAVENOUS

## 2024-08-23 MED ORDER — SODIUM CHLORIDE 0.9 % IV SOLN
80.0000 mg | INTRAVENOUS | Status: AC
  Administered 2024-08-23: 80 mg

## 2024-08-23 MED ORDER — ONDANSETRON HCL 4 MG/2ML IJ SOLN: 4.0000 mg | Freq: Four times a day (QID) | INTRAMUSCULAR | Status: DC | PRN

## 2024-08-23 MED ORDER — MIDAZOLAM HCL 2 MG/2ML IJ SOLN
INTRAMUSCULAR | Status: AC
  Filled 2024-08-23: qty 2

## 2024-08-23 MED ORDER — CEFAZOLIN SODIUM-DEXTROSE 1-4 GM/50ML-% IV SOLN: 1.0000 g | Freq: Once | INTRAVENOUS | Status: DC

## 2024-08-23 MED ORDER — ACETAMINOPHEN 325 MG PO TABS: 325.0000 mg | ORAL_TABLET | ORAL | Status: DC | PRN

## 2024-08-23 MED ORDER — SODIUM CHLORIDE 0.9 % IV SOLN
INTRAVENOUS | Status: DC | PRN
  Administered 2024-08-23: 1000 mL via INTRAVENOUS

## 2024-08-23 MED ORDER — FENTANYL CITRATE (PF) 100 MCG/2ML IJ SOLN
INTRAMUSCULAR | Status: AC
  Filled 2024-08-23: qty 2

## 2024-08-23 MED ORDER — SODIUM CHLORIDE 0.9 % IV SOLN
INTRAVENOUS | Status: AC
  Filled 2024-08-23: qty 2

## 2024-08-23 MED ORDER — LIDOCAINE HCL (PF) 1 % IJ SOLN
INTRAMUSCULAR | Status: DC | PRN
  Administered 2024-08-23: 30 mL

## 2024-08-23 MED ORDER — CEFAZOLIN SODIUM-DEXTROSE 2-3 GM-%(50ML) IV SOLR
INTRAVENOUS | Status: DC | PRN
  Administered 2024-08-23: 2 g via INTRAVENOUS

## 2024-08-23 MED ORDER — CHLORHEXIDINE GLUCONATE 4 % EX SOLN: 4.0000 | Freq: Once | CUTANEOUS | Status: DC

## 2024-08-23 MED ORDER — POVIDONE-IODINE 10 % EX SWAB
2.0000 | Freq: Once | CUTANEOUS | Status: AC
  Administered 2024-08-23: 2 via TOPICAL

## 2024-08-23 MED ORDER — FENTANYL CITRATE (PF) 100 MCG/2ML IJ SOLN
INTRAMUSCULAR | Status: DC | PRN
  Administered 2024-08-23 (×4): 12.5 ug via INTRAVENOUS

## 2024-08-23 MED ORDER — CEFAZOLIN SODIUM-DEXTROSE 2-4 GM/100ML-% IV SOLN: 2.0000 g | INTRAVENOUS | Status: DC

## 2024-08-23 SURGICAL SUPPLY — 7 items
CABLE SURGICAL S-101-97-12 (CABLE) ×1 IMPLANT
ICD MOMENTUM D120 (ICD Generator) IMPLANT
KIT MICROPUNCTURE NIT STIFF (SHEATH) IMPLANT
LEAD RELIANCE 0137-59 (Lead) IMPLANT
PAD DEFIB RADIO PHYSIO CONN (PAD) ×1 IMPLANT
SHEATH 9.5FR PRELUDE SNAP 13 (SHEATH) IMPLANT
TRAY PACEMAKER INSERTION (PACKS) ×1 IMPLANT

## 2024-08-23 NOTE — H&P (Signed)
 HPI Thomas Nixon is referred today by Thomas West, NP for consideration for ICD insertion. He is a pleasant 53 yo man with a h/o non-ischemic CM and chronic systolic heart failure, EF 30-35%. He has not had syncope but note occasional dizzy spells and some lower extremity swelling. Evaluation to date demonstrates a calcium  score of 16. He has a h/o PAF and has been on xarelto . He has been treated with GDMT and not had an improvement in his EF.  Allergies       Allergies  Allergen Reactions   Pork-Derived Products Other (See Comments)      Diet restriction                Current Outpatient Medications  Medication Sig Dispense Refill   amLODipine  (NORVASC ) 10 MG tablet Take 10 mg daily by mouth.   3   atorvastatin  (LIPITOR) 40 MG tablet Take 1 tablet (40 mg total) by mouth at bedtime. 90 tablet 3   furosemide  (LASIX ) 40 MG tablet Take 1 tablet (40 mg total) by mouth as needed (for increased lower extremity edema, increased shortness of breath, weight gain of 2-3 lbs overnight or 5 lbs in a week.). 25 tablet 2   JARDIANCE 10 MG TABS tablet Take 10 mg by mouth daily.       metFORMIN  (GLUCOPHAGE ) 1000 MG tablet Take 1,000 mg by mouth 2 (two) times daily with a meal.       metoprolol  succinate (TOPROL -XL) 50 MG 24 hr tablet TAKE 1 TABLET BY MOUTH EVERY DAY WITH OR IMMEDIATELY FOLLOWING A MEAL 90 tablet 3   metoprolol  tartrate (LOPRESSOR ) 50 MG tablet Take 1 tablet (50 mg total) by mouth once for 1 dose. 2 hours prior to scan 1 tablet 0   pregabalin  (LYRICA ) 300 MG capsule Take 300 mg by mouth 2 (two) times daily.       rivaroxaban  (XARELTO ) 20 MG TABS tablet TAKE 1 TABLET BY MOUTH DAILY WITH SUPPER 90 tablet 1   sacubitril -valsartan  (ENTRESTO ) 24-26 MG Take 1 tablet by mouth 2 (two) times daily. 180 tablet 3   spironolactone  (ALDACTONE ) 25 MG tablet Take 1 tablet (25 mg total) by mouth daily. 90 tablet 3   traMADol (ULTRAM) 50 MG tablet Take 50 mg by mouth every 6 (six) hours as  needed.       VUMERITY  231 MG CPDR Take 2 capsules by mouth 2 (two) times daily. 432mg  po bid maintenance. 120 capsule 11      No current facility-administered medications for this visit.              Past Medical History:  Diagnosis Date   CVA (cerebral infarction)     Diabetes mellitus without complication (HCC)      Type 2   Encounter for long-term (current) use of other medications     Headache(784.0)     Hypertension     MS (multiple sclerosis) (HCC)      possible   Neuropathy     Other and unspecified hyperlipidemia     Stroke (HCC)      5 strokes   Thoracic or lumbosacral neuritis or radiculitis, unspecified     Unspecified late effects of cerebrovascular disease     Unspecified sleep apnea      does not use cpap          ROS:    All systems reviewed and negative except as noted in the HPI.  Past Surgical History:  Procedure Laterality Date   ANTERIOR LAT LUMBAR FUSION N/A 08/30/2014    Procedure: ANTERIOR LATERAL LUMBAR FUSION 1 LEVEL;  Surgeon: Oneil Rodgers Priestly, MD;  Location: [facility] OR;  Service: Orthopedics;  Laterality: N/A;  Lumbar 3-4 lateral interbody fusion with instrumentation and allograft   BRAIN BIOPSY                     Family History  Problem Relation Age of Onset   High blood pressure Mother     Diabetes Mother     High blood pressure Father     Stroke Maternal Grandmother     Stroke Paternal Grandmother              Social History         Socioeconomic History   Marital status: Married      Spouse name: Thomas Nixon   Number of children: 3   Years of education: college   Highest education level: Not on file  Occupational History      Comment: Disabled  Tobacco Use   Smoking status: Former      Types: Cigars   Smokeless tobacco: Never   Tobacco comments:      04/11/2024 Patient stopped smoking a couple months ago  Vaping Use   Vaping status: Never Used  Substance and Sexual Activity   Alcohol use: Yes       Comment: Once per month.   Drug use: No   Sexual activity: Not on file  Other Topics Concern   Not on file  Social History Narrative    Patient is married and lives at home with his wife Thomas Nixon .    Disabled.    Education [employer].    Right handed.    1 cup caffeine per day.    Social Drivers of Acupuncturist Strain: Not on file  Food Insecurity: No Food Insecurity (12/23/2022)    Hunger Vital Sign     Worried About Running Out of Food in the Last Year: Never true     Ran Out of Food in the Last Year: Never true  Transportation Needs: No Transportation Needs (12/23/2022)    PRAPARE - Therapist, art (Medical): No     Lack of Transportation (Non-Medical): No  Physical Activity: Not on file  Stress: Not on file  Social Connections: Not on file  Intimate Partner Violence: Not on file        BP (!) 146/84   Pulse 88   Ht 5' 10 (1.778 m)   Wt 216 lb (98 kg)   SpO2 95%   BMI 30.99 kg/m    Physical Exam:   Well appearing 53 yo man, NAD HEENT: Unremarkable Neck:  No JVD, no thyromegally Lymphatics:  No adenopathy Back:  No CVA tenderness Lungs:  Clear with no wheezes HEART:  Regular rate rhythm, no murmurs, no rubs, no clicks Abd:  soft, positive bowel sounds, no organomegally, no rebound, no guarding Ext:  2 plus pulses, no edema, no cyanosis, no clubbing Skin:  No rashes no nodules Neuro:  CN II through XII intact, motor grossly intact   EKG - nsr     Assess/Plan: Chronic systolic heart failure - his EF has been 30-35% for several months despite GDMT and I have discussed the indications/risks/benefits/goals/expectations of ICD insertion and recommend proceeding.    Danelle Deago Burruss,MD

## 2024-08-23 NOTE — Discharge Instructions (Signed)
 After Your ICD (Implantable Cardiac Defibrillator)   You have a [employer] ICD  If you have a Medtronic or [employer]ronik device, plug in your home monitor once you get home, and no manual interaction is required.   If you have an [employer] or [employer] device, plug your home monitor once you get home, sit near the device, and press the large activation button. Sit nearby until the process is complete, usually notated by lights on the monitor.   If you were set up for monitoring using an app on your phone, make sure the app remains open in the background and the Bluetooth remains on.  ACTIVITY Do not lift your arm above shoulder height for 1 week after your procedure. After 7 days, you may progress as below.  You should remove your sling 24 hours after your procedure, unless otherwise instructed by your provider.     Wednesday August 30, 2024  Thursday August 31, 2024 Friday September 01, 2024 Saturday September 02, 2024   Do not lift, push, pull, or carry anything over 10 pounds with the affected arm until 6 weeks (Wednesday October 04, 2024 ) after your procedure.   You may drive AFTER your wound check, unless you have been told otherwise by your provider.   Ask your healthcare provider when you can go back to work   INCISION/Dressing If you are on a blood thinner such as Coumadin, Xarelto , Eliquis, Plavix , or Pradaxa please confirm with your provider when this should be resumed. 10/6   If large square, outer bandage is left in place, this can be removed after 24 hours from your procedure. Do not remove steri-strips or glue as below.   Monitor your defibrillator site for redness, swelling, and drainage. Call the device clinic at [phone] if you experience these symptoms or fever/chills.  If your incision is sealed with Steri-strips or staples, you may shower 7 days after your procedure or when told by your provider. Do not remove the steri-strips or let the shower hit  directly on your site. You may wash around your site with soap and water .    If you were discharged in a sling, please do not wear this during the day more than 48 hours after your surgery unless otherwise instructed. This may increase the risk of stiffness and soreness in your shoulder.   Avoid lotions, ointments, or perfumes over your incision until it is well-healed.  You may use a hot tub or a pool AFTER your wound check appointment if the incision is completely closed.  Your ICD is designed to protect you from life threatening heart rhythms. Because of this, you may receive a shock.   1 shock with no symptoms:  Call the office during business hours. 1 shock with symptoms (chest pain, chest pressure, dizziness, lightheadedness, shortness of breath, overall feeling unwell):  Call 911. If you experience 2 or more shocks in 24 hours:  Call 911. If you receive a shock, you should not drive for 6 months per the [place] DMV IF you receive appropriate therapy from your ICD.   ICD Alerts:  Some alerts are vibratory and others beep. These are NOT emergencies. Please call our office to let us  know. If this occurs at night or on weekends, it can wait until the next business day. Send a remote transmission.  If your device is capable of reading fluid status (for heart failure), you will be offered monthly monitoring to review this with you.   DEVICE MANAGEMENT  Remote monitoring is used to monitor your ICD from home. This monitoring is scheduled every 91 days by our office. It allows us  to keep an eye on the functioning of your device to ensure it is working properly. You will routinely see your Electrophysiologist annually (more often if necessary). This will appear as a REMOTE check on your MyChart schedule. These are automatic and there is nothing for you to manually do unless otherwise instructed.  You should receive your ID card for your new device in 4-8 weeks. Keep this card with you at all times once  received. Consider wearing a medical alert bracelet or necklace.  Your ICD  may be MRI compatible. This will be discussed at your next office visit/wound check.  You should avoid contact with strong electric or magnetic fields.   Do not use amateur (ham) radio equipment or electric (arc) welding torches. MP3 player headphones with magnets should not be used. Some devices are safe to use if held at least 12 inches (30 cm) from your defibrillator. These include power tools, lawn mowers, and speakers. If you are unsure if something is safe to use, ask your health care provider.  When using your cell phone, hold it to the ear that is on the opposite side from the defibrillator. Do not leave your cell phone in a pocket over the defibrillator.  You may safely use electric blankets, heating pads, computers, and microwave ovens.  Call the office right away if: You have chest pain. You feel more than one shock. You feel more short of breath than you have felt before. You feel more light-headed than you have felt before. Your incision starts to open up.  This information is not intended to replace advice given to you by your health care provider. Make sure you discuss any questions you have with your health care provider.

## 2024-08-24 MED FILL — Midazolam HCl Inj PF 2 MG/2ML (Base Equivalent): INTRAMUSCULAR | Qty: 4 | Status: AC

## 2024-08-25 ENCOUNTER — Encounter (HOSPITAL_COMMUNITY): Payer: Self-pay | Admitting: Internal Medicine

## 2024-09-05 ENCOUNTER — Ambulatory Visit

## 2024-09-05 ENCOUNTER — Ambulatory Visit: Attending: Cardiology

## 2024-09-05 DIAGNOSIS — D6869 Other thrombophilia: Secondary | ICD-10-CM | POA: Diagnosis not present

## 2024-09-05 DIAGNOSIS — E1142 Type 2 diabetes mellitus with diabetic polyneuropathy: Secondary | ICD-10-CM | POA: Diagnosis not present

## 2024-09-05 DIAGNOSIS — F324 Major depressive disorder, single episode, in partial remission: Secondary | ICD-10-CM | POA: Diagnosis not present

## 2024-09-05 DIAGNOSIS — I4891 Unspecified atrial fibrillation: Secondary | ICD-10-CM | POA: Diagnosis not present

## 2024-09-05 DIAGNOSIS — I509 Heart failure, unspecified: Secondary | ICD-10-CM | POA: Diagnosis not present

## 2024-09-05 DIAGNOSIS — I110 Hypertensive heart disease with heart failure: Secondary | ICD-10-CM | POA: Diagnosis not present

## 2024-09-05 DIAGNOSIS — E669 Obesity, unspecified: Secondary | ICD-10-CM | POA: Diagnosis not present

## 2024-09-05 DIAGNOSIS — I429 Cardiomyopathy, unspecified: Secondary | ICD-10-CM | POA: Diagnosis not present

## 2024-09-05 DIAGNOSIS — M1990 Unspecified osteoarthritis, unspecified site: Secondary | ICD-10-CM | POA: Diagnosis not present

## 2024-09-05 DIAGNOSIS — I480 Paroxysmal atrial fibrillation: Secondary | ICD-10-CM

## 2024-09-05 NOTE — Patient Instructions (Signed)

## 2024-09-05 NOTE — Progress Notes (Signed)
 Normal single chamber ICD wound check. Wound well healed. Presenting rhythm: VS 75. Routine testing performed. Thresholds, sensing, and impedance consistent with implant measurements with 3.5V safety margin/auto capture until 3 month visit. No treated arrhythmias. Reviewed arm restrictions to continue for 6 weeks total post op. Reviewed shock plan.  Pt enrolled in remote follow-up.

## 2024-09-06 ENCOUNTER — Ambulatory Visit: Payer: Medicare PPO | Admitting: Neurology

## 2024-09-06 LAB — CUP PACEART INCLINIC DEVICE CHECK
Date Time Interrogation Session: 20251014100847
Implantable Lead Connection Status: 753985
Implantable Lead Implant Date: 20251001
Implantable Lead Location: 753860
Implantable Lead Model: 137
Implantable Lead Serial Number: 302005
Implantable Pulse Generator Implant Date: 20251001
Pulse Gen Serial Number: 225078

## 2024-09-11 NOTE — Progress Notes (Unsigned)
 No chief complaint on file.  ASSESSMENT AND PLAN 1.  Relapsing remitting multiple sclerosis 2.  Cognitive impairment 3.  Gait abnormality  Feels overall stable on Vumerity   Check labs today  Repeat MRI of the brain   Refer to physical therapy     DIAGNOSTIC DATA (LABS, IMAGING, TESTING) - I reviewed patient records, labs, notes, testing and imaging myself where available.  MRI scan of the brain with and without contrast in Sept 2021: showing confluent periventricular and subcortical white matter hyperintensities compatible with chronic demyelinating disease.  No acute enhancing lesions are noted.  The presence of T1 black holes and mild atrophy of corpus callosum and cortex indicates chronic disease.  Overall no significant change compared with previous MRI dated 06/27/2019  Laboratory evaluation August 2022: Vitamin D  26.5  MRI brain Feb 2025 IMPRESSION: MRI scan of the brain with and without contrast changes of periventricular, subcortical and periatrial T2/FLAIR white matter hyperintensities with differential discussed above.  This appears slightly progressed compared with previous MRI from 01/20/2022 no enhancing lesions are noted.   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   Jan 2025: CBC unremarkable, Vitamin D  25, creatinine 1.38,   HISTORY OF PRESENT ILLNESS: Thomas Nixon is a 53 y.o. male here today for follow up for RRMS.  Thomas Nixon is followed routinely by Dr Onita and Lauraine Born, DNP. He has continued Vumerity  and tolerating well. MRI 2021 was stable. Labs have been unremarkable with exception of elevated JCV and low vitamin D .   He is doing well. No new or exacerbating symptoms. He feels that gait is stable. No falls. He is sleeping well. He feels mood is good. No changes to bowel or bladder habits. No vision changes.   He continues vitamin D  supplements 1000iu daily. He admits that he is not always consistent  with taking supplement.   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  Also discussed findings with his wife, he has mild memory loss, MoCA examination is 65 out of 39 today  UPDATE Jan 7th 2024: He is with his wife, he seems to have more trouble walking, stiff,   more cognitive slow, like to watch videos at home, more sedentary, still driving,  He is tolerating Vumerity , most recent MRI of the brain with without contrast was in February 2023, showing scattered brainstem, cerebellum, periatrial, periventricular and subcortical white matter hyperintensities and a patent distribution compatible with chronic demyelinating disease. No acute or enhancing lesions are noted. Remote area of encephalomalacia with gliosis and certainly moderate hemorrhagic products in the right frontal periventricular region likely represents an old stroke. No enhancing lesions are noted. Overall no significant change compared with previous MRI from 08/03/2020.   Update September 12, 2024 SS: Feb 2025, MRI of the brain showed confluent periventricular white matter disease, slight progress compared to previous scan in February 2023. ICD implanted 08/23/24.      PHYSICAL EXAM  Vitals:   12/29/21 1328  BP: (!) 160/92  Pulse: 87  Weight: 246 lb (111.6 kg)  Height: 5' 9 (1.753 m)   Body mass index is 36.33 kg/m.  Generalized: Well  developed, in no acute distress  Cardiology: normal rate and rhythm, no murmur auscultated  Respiratory: clear to auscultation bilaterally    Neurological examination:  Mentation: Depressed looking middle-age male, slow reaction, following command, answers questions appropriately     12/29/2021    1:00 PM  Montreal Cognitive Assessment   Visuospatial/ Executive (0/5) 2  Naming (0/3) 3  Attention: Read list of digits (0/2) 1  Attention: Read list of letters (0/1) 1  Attention: Serial 7 subtraction starting at 100 (0/3) 1  Language: Repeat phrase (0/2) 2  Language : Fluency (0/1) 1  Abstraction (0/2) 2  Delayed Recall (0/5) 0  Orientation (0/6) 4  Total 17    Cranial nerve II-XII: Pupils were equal round reactive to light. Extraocular movements were full, visual field were full on confrontational test. Facial sensation and strength were normal. Head turning and shoulder shrug  were normal and symmetric.  Motor: The motor testing reveals 5 over 5 strength of all 4 extremities. Good symmetric motor tone is noted throughout.   Sensory: Sensory testing is intact to soft touch on all 4 extremities. No evidence of extinction is noted.   Coordination: Cerebellar testing reveals good finger-nose-finger and heel-to-shin bilaterally.   Gait and station: Push-up to get up from seated position, cautious unsteady dragging left leg  Reflexes: Deep tendon reflexes are symmetric and normal bilaterally.   REVIEW OF SYSTEMS: Out of a complete 14 system review of symptoms, the patient complains only of the following symptoms, fatigue, weakness of right lower extremity and all other reviewed systems are negative.   ALLERGIES: Allergies  Allergen Reactions   Porcine (Pork) Protein-Containing Drug Products Other (See Comments)    Diet restriction     HOME MEDICATIONS: Outpatient Medications Prior to Visit  Medication Sig Dispense Refill   amLODipine  (NORVASC ) 10 MG tablet Take 10 mg daily by  mouth.  3   atorvastatin  (LIPITOR) 40 MG tablet Take 1 tablet (40 mg total) by mouth at bedtime. 90 tablet 3   furosemide  (LASIX ) 40 MG tablet Take 1 tablet (40 mg total) by mouth as needed (for increased lower extremity edema, increased shortness of breath, weight gain of 2-3 lbs overnight or 5 lbs in a week.). (Patient taking differently: Take 20 mg by mouth in the morning.) 25 tablet 2   JARDIANCE 10 MG TABS tablet Take 10 mg by mouth daily.     metFORMIN  (GLUCOPHAGE ) 1000 MG tablet Take 1,000 mg by mouth 2 (two) times daily with a meal.     pregabalin  (LYRICA ) 300 MG capsule Take 300 mg by mouth 2 (two) times daily.     rivaroxaban  (XARELTO ) 20 MG TABS tablet TAKE 1 TABLET BY MOUTH DAILY WITH SUPPER 90 tablet 1   sacubitril -valsartan  (ENTRESTO ) 24-26 MG Take 1 tablet by mouth 2 (two) times daily. 180 tablet 3   spironolactone  (ALDACTONE ) 25 MG tablet Take 1 tablet (25 mg total) by mouth daily. 90 tablet 3   VUMERITY  231 MG CPDR Take 2 capsules by mouth 2 (two) times daily. 432mg  po bid maintenance. (Patient taking differently: Take 231 mg by mouth 2 (two) times daily.) 120 capsule 11   No facility-administered medications prior to visit.     PAST MEDICAL HISTORY: Past Medical History:  Diagnosis Date   CVA (  cerebral infarction)    Diabetes mellitus without complication (HCC)    Type 2   Encounter for long-term (current) use of other medications    Headache(784.0)    Hypertension    MS (multiple sclerosis)    possible   Neuropathy    Other and unspecified hyperlipidemia    Stroke (HCC)    5 strokes   Thoracic or lumbosacral neuritis or radiculitis, unspecified    Unspecified late effects of cerebrovascular disease    Unspecified sleep apnea    does not use cpap     PAST SURGICAL HISTORY: Past Surgical History:  Procedure Laterality Date   ANTERIOR LAT LUMBAR FUSION N/A 08/30/2014   Procedure: ANTERIOR LATERAL LUMBAR FUSION 1 LEVEL;  Surgeon: Oneil Rodgers Priestly, MD;   Location: [facility] OR;  Service: Orthopedics;  Laterality: N/A;  Lumbar 3-4 lateral interbody fusion with instrumentation and allograft   BRAIN BIOPSY     ICD IMPLANT N/A 08/23/2024   Procedure: ICD IMPLANT;  Surgeon: Waddell Danelle ORN, MD;  Location: [facility] INVASIVE CV LAB;  Service: Cardiovascular;  Laterality: N/A;     FAMILY HISTORY: Family History  Problem Relation Age of Onset   High blood pressure Mother    Diabetes Mother    High blood pressure Father    Stroke Maternal Grandmother    Stroke Paternal Grandmother      SOCIAL HISTORY: Social History   Socioeconomic History   Marital status: Married    Spouse name: Montie   Number of children: 3   Years of education: college   Highest education level: Not on file  Occupational History    Comment: Disabled  Tobacco Use   Smoking status: Former    Types: Cigars   Smokeless tobacco: Never   Tobacco comments:    04/11/2024 Patient stopped smoking a couple months ago  Vaping Use   Vaping status: Never Used  Substance and Sexual Activity   Alcohol use: Yes    Comment: Once per month.   Drug use: No   Sexual activity: Not on file  Other Topics Concern   Not on file  Social History Narrative   Patient is married and lives at home with his wife Montie .   Disabled.   Education [employer].   Right handed.   1 cup caffeine per day.   Social Drivers of Corporate investment banker Strain: Not on file  Food Insecurity: No Food Insecurity (12/23/2022)   Hunger Vital Sign    Worried About Running Out of Food in the Last Year: Never true    Ran Out of Food in the Last Year: Never true  Transportation Needs: No Transportation Needs (12/23/2022)   PRAPARE - Administrator, Civil Service (Medical): No    Lack of Transportation (Non-Medical): No  Physical Activity: Not on file  Stress: Not on file  Social Connections: Not on file  Intimate Partner Violence: Not on file   Lauraine Born, SCHARLENE, [facility]  [facility] [address], [address] [place], [place] [address] [phone]

## 2024-09-12 ENCOUNTER — Encounter: Payer: Self-pay | Admitting: Neurology

## 2024-09-12 ENCOUNTER — Ambulatory Visit: Admitting: Neurology

## 2024-09-12 VITALS — BP 140/96 | HR 61 | Ht 69.0 in | Wt 213.0 lb

## 2024-09-12 DIAGNOSIS — R269 Unspecified abnormalities of gait and mobility: Secondary | ICD-10-CM

## 2024-09-12 DIAGNOSIS — G35D Multiple sclerosis, unspecified: Secondary | ICD-10-CM

## 2024-09-12 NOTE — Patient Instructions (Signed)
 Great to see you today! Continue Vumerity  Check routine labs today Referral to physical therapy Follow-up in 6 months.  Thanks!!

## 2024-09-13 ENCOUNTER — Ambulatory Visit: Payer: Self-pay | Admitting: Neurology

## 2024-09-13 LAB — COMPREHENSIVE METABOLIC PANEL WITH GFR
ALT: 8 IU/L (ref 0–44)
AST: 14 IU/L (ref 0–40)
Albumin: 4.2 g/dL (ref 3.8–4.9)
Alkaline Phosphatase: 38 IU/L — ABNORMAL LOW (ref 47–123)
BUN/Creatinine Ratio: 18 (ref 9–20)
BUN: 19 mg/dL (ref 6–24)
Bilirubin Total: 0.5 mg/dL (ref 0.0–1.2)
CO2: 26 mmol/L (ref 20–29)
Calcium: 8.8 mg/dL (ref 8.7–10.2)
Chloride: 105 mmol/L (ref 96–106)
Creatinine, Ser: 1.06 mg/dL (ref 0.76–1.27)
Globulin, Total: 2.5 g/dL (ref 1.5–4.5)
Glucose: 92 mg/dL (ref 70–99)
Potassium: 4.3 mmol/L (ref 3.5–5.2)
Sodium: 145 mmol/L — ABNORMAL HIGH (ref 134–144)
Total Protein: 6.7 g/dL (ref 6.0–8.5)
eGFR: 85 mL/min/1.73 (ref >59)

## 2024-09-13 LAB — CBC WITH DIFFERENTIAL/PLATELET
Basophils Absolute: 0 x10E3/uL (ref 0.0–0.2)
Basos: 0 %
EOS (ABSOLUTE): 0.1 x10E3/uL (ref 0.0–0.4)
Eos: 1 %
Hematocrit: 44.6 % (ref 37.5–51.0)
Hemoglobin: 14.2 g/dL (ref 13.0–17.7)
Immature Grans (Abs): 0 x10E3/uL (ref 0.0–0.1)
Immature Granulocytes: 0 %
Lymphocytes Absolute: 0.9 x10E3/uL (ref 0.7–3.1)
Lymphs: 20 %
MCH: 28.4 pg (ref 26.6–33.0)
MCHC: 31.8 g/dL (ref 31.5–35.7)
MCV: 89 fL (ref 79–97)
Monocytes Absolute: 0.4 x10E3/uL (ref 0.1–0.9)
Monocytes: 8 %
Neutrophils Absolute: 3.3 x10E3/uL (ref 1.4–7.0)
Neutrophils: 71 %
Platelets: 231 x10E3/uL (ref 150–450)
RBC: 5 x10E6/uL (ref 4.14–5.80)
RDW: 13.5 % (ref 11.6–15.4)
WBC: 4.7 x10E3/uL (ref 3.4–10.8)

## 2024-09-13 LAB — VITAMIN D 25 HYDROXY (VIT D DEFICIENCY, FRACTURES): Vit D, 25-Hydroxy: 20.9 ng/mL — ABNORMAL LOW (ref 30.0–100.0)

## 2024-09-13 NOTE — Therapy (Signed)
 OUTPATIENT PHYSICAL THERAPY NEURO EVALUATION   Patient Name: Thomas Nixon MRN: [id number] DOB:05-May-1971, 53 y.o., male Today's Date: 09/15/2024   PCP: Ransom Other, MD  REFERRING PROVIDER: Gayland Lauraine PARAS, NP  END OF SESSION:  PT End of Session - 09/15/24 0724     Visit Number 1    Number of Visits 9    Date for Recertification  10/15/24    Authorization Type [employer]IC[employer] - needs auth    PT Start Time 0722    PT Stop Time 0800    PT Time Calculation (min) 38 min    Equipment Utilized During Treatment Gait belt    Activity Tolerance Patient tolerated treatment well    Behavior During Therapy WFL for tasks assessed/performed          Past Medical History:  Diagnosis Date   CVA (cerebral infarction)    Diabetes mellitus without complication (HCC)    Type 2   Encounter for long-term (current) use of other medications    Headache(784.0)    Hypertension    MS (multiple sclerosis)    possible   Neuropathy    Other and unspecified hyperlipidemia    Stroke (HCC)    5 strokes   Thoracic or lumbosacral neuritis or radiculitis, unspecified    Unspecified late effects of cerebrovascular disease    Unspecified sleep apnea    does not use cpap   Past Surgical History:  Procedure Laterality Date   ANTERIOR LAT LUMBAR FUSION N/A 08/30/2014   Procedure: ANTERIOR LATERAL LUMBAR FUSION 1 LEVEL;  Surgeon: Oneil Rodgers Priestly, MD;  Location: [facility] OR;  Service: Orthopedics;  Laterality: N/A;  Lumbar 3-4 lateral interbody fusion with instrumentation and allograft   BRAIN BIOPSY     CARDIAC DEFIBRILLATOR PLACEMENT     Sept 2025   ICD IMPLANT N/A 08/23/2024   Procedure: ICD IMPLANT;  Surgeon: Waddell Danelle ORN, MD;  Location: [facility] INVASIVE CV LAB;  Service: Cardiovascular;  Laterality: N/A;   Patient Active Problem List   Diagnosis Date Noted   Paroxysmal atrial fibrillation (HCC) 04/30/2022   Vitamin D  deficiency 06/11/2020   Gait abnormality 10/04/2017   Radiculopathy  08/30/2014   Multiple sclerosis 08/10/2014   Diabetes mellitus without complication (HCC)    Cerebral infarction [facility])    Thoracic or lumbosacral neuritis or radiculitis, unspecified    Other and unspecified hyperlipidemia    Encounter for long-term (current) use of other medications    Late effects of cerebrovascular disease    Sleep apnea    Thrombotic cerebral infarction (HCC) 06/07/2013   Dysarthria 06/01/2013   Hypertension 06/01/2013    ONSET DATE: 09/12/2024  REFERRING DIAG: H64.D (ICD-10-CM) - Multiple sclerosis  THERAPY DIAG:  Unsteadiness on feet  Muscle weakness (generalized)  Other abnormalities of gait and mobility  Rationale for Evaluation and Treatment: Rehabilitation  SUBJECTIVE:  SUBJECTIVE STATEMENT: Diagnosed with MS about 4-5 years ago. Mainly affects his R side and notes his R side is weak. Reports his balance always feels off, can tell on his R side when he is walking. No falls. Mainly walks for exercise and uses a walking pole for longer walks outside.   Pt accompanied by: self  PERTINENT HISTORY: PMH: RRMS, cognitive impairment,  ICD implanted 08/23/24 for CHF, HTN, Neuropathy, Type 2 Diaebtes, hx of CVA  PAIN:  Are you having pain? No  Vitals:   09/15/24 0732  BP: (!) 157/90  Pulse: 65     PRECAUTIONS: None  RED FLAGS: None   WEIGHT BEARING RESTRICTIONS: No  FALLS: Has patient fallen in last 6 months? No  LIVING ENVIRONMENT: Lives with: lives with their family and has 3 children  Lives in: House/apartment Stairs: Yes: Internal: 12 steps; on right going up and External: 1 steps; none Has following equipment at home: walking stick   PLOF: Independent Still driving, not working   PATIENT GOALS: Wants to get his R leg stronger   OBJECTIVE:  Note:  Objective measures were completed at Evaluation unless otherwise noted.  DIAGNOSTIC FINDINGS: MRI brain Feb 2025 IMPRESSION: MRI scan of the brain with and without contrast changes of periventricular, subcortical and periatrial T2/FLAIR white matter hyperintensities with differential discussed above.  This appears slightly progressed compared with previous MRI from 01/20/2022 no enhancing lesions are noted.   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     COGNITION: Overall cognitive status: Within functional limits for tasks assessed Cognitive impairment noted in pt's chart    SENSATION: Light touch: WFL  COORDINATION: Heel to shin: WNL    POSTURE: rounded shoulders and forward head   LOWER EXTREMITY MMT:    MMT Right Eval Left Eval  Hip flexion 4+ 5  Hip extension    Hip abduction 5 5  Hip adduction 5 5  Hip internal rotation    Hip external rotation    Knee flexion 5 5  Knee extension 4+ 5  Ankle dorsiflexion 5 5  Ankle plantarflexion    Ankle inversion    Ankle eversion    (Blank rows = not tested)    All tested in sitting   TRANSFERS: Sit to stand: Modified independence  Assistive device utilized: None     Stand to sit: Modified independence  Assistive device utilized: None      With LLE staggered slightly behind   GAIT: Findings: Gait Characteristics: step through pattern, decreased arm swing- Right, decreased stance time- Right, decreased stride length, decreased hip/knee flexion- Right, trunk flexed, and wide BOS, Distance walked: clinic distances , Assistive device utilized:None, Level of assistance: Modified independence, and Comments: decr hip extension noted   FUNCTIONAL TESTS:  5 times sit to stand: 14.8 seconds with no UE support, pt slower with 4th rep 10 meter walk test: 12.85 seconds = 2.55 ft/sec   SLS: RLE: 5 seconds, LLE: 11.6 seconds    M-CTSIB  Condition 1: Firm Surface, EO 30  Sec, Normal Sway  Condition 2: Firm Surface, EC 30 Sec, Normal Sway  Condition 3: Foam Surface, EO 30 Sec, Normal Sway  Condition 4: Foam Surface, EC 20 Sec     [facility] PT Assessment - 09/15/24 0755       Functional Gait  Assessment   Gait assessed  Yes    Gait Level Surface Walks 20 ft, slow speed, abnormal gait pattern, evidence for imbalance  or deviates 10-15 in outside of the 12 in walkway width. Requires more than 7 sec to ambulate 20 ft.   8.3   Change in Gait Speed Able to smoothly change walking speed without loss of balance or gait deviation. Deviate no more than 6 in outside of the 12 in walkway width.    Gait with Horizontal Head Turns Performs head turns smoothly with slight change in gait velocity (eg, minor disruption to smooth gait path), deviates 6-10 in outside 12 in walkway width, or uses an assistive device.    Gait with Vertical Head Turns Performs head turns with no change in gait. Deviates no more than 6 in outside 12 in walkway width.    Gait and Pivot Turn Pivot turns safely within 3 sec and stops quickly with no loss of balance.    Step Over Obstacle Is able to step over one shoe box (4.5 in total height) without changing gait speed. No evidence of imbalance.    Gait with Narrow Base of Support Ambulates less than 4 steps heel to toe or cannot perform without assistance.    Gait with Eyes Closed Walks 20 ft, slow speed, abnormal gait pattern, evidence for imbalance, deviates 10-15 in outside 12 in walkway width. Requires more than 9 sec to ambulate 20 ft.   11.4   Ambulating Backwards Walks 20 ft, uses assistive device, slower speed, mild gait deviations, deviates 6-10 in outside 12 in walkway width.   13.7   Steps Alternating feet, must use rail.    Total Score 19    FGA comment: 19/30 = Medium Fall Risk                                                                                           TREATMENT DATE: 09/15/24   Self-Care: Clinical findings, fall risk  based on FGA, POC, areas that PT will address in therapy Asked pt if he had any difficulty using his R hand/RUE in case pt would benefit from OT, pt denies any difficulties   PATIENT EDUCATION: Education details: See self-care Person educated: Patient Education method: Explanation Education comprehension: verbalized understanding  HOME EXERCISE PROGRAM: Will provide at future session   GOALS: Goals reviewed with patient? Yes  SHORT TERM GOALS: ALL STGS = LTGS   LONG TERM GOALS: Target date: 10/13/2024  Pt will be independent with final HEP for strength, gait, balance in order to build upon functional gains made in therapy. Baseline:  Goal status: INITIAL  2.  Pt will improve FGA to at least a 24/30 in order to demo decr fall risk. Baseline: 19/30 Goal status: INITIAL  3.  Pt will improve condition 4 of mCTSIB to at least 30 seconds in order to demo improved vestibular input for balance.  Baseline: 20 seconds  Goal status: INITIAL  4.  Pt will improve gait speed with no AD to at least 3.0 ft/sec in order to demo improved community mobility.  Baseline: 12.85 seconds = 2.55 ft/sec  Goal status: INITIAL  5.  Pt will improve 5x sit<>stand to less than or equal to 12 sec to demonstrate improved functional strength  and transfer efficiency.   Baseline: 14.8 seconds with no UE support Goal status: INITIAL   ASSESSMENT:  CLINICAL IMPRESSION: Patient is a 53 year old male referred to Neuro OPPT for MS (pt diagnosed with RRMS) and RLE weakness.   Pt's PMH is significant for: RRMS, cognitive impairment,  ICD implanted 08/23/24 for CHF, HTN, Neuropathy, Type 2 Diaebtes, hx of CVA. The following deficits were present during the exam: impaired balance, gait abnormalities, posture abnormalities, decr RLE strength. Based on FGA, pt is an incr risk for falls. Based on gait speed with no AD, pt is a limited community ambulator. Pt would benefit from skilled PT to address these impairments and  functional limitations to maximize functional mobility independence   OBJECTIVE IMPAIRMENTS: Abnormal gait, decreased activity tolerance, decreased balance, decreased cognition, decreased mobility, decreased strength, and postural dysfunction.   ACTIVITY LIMITATIONS: stairs and locomotion level  PARTICIPATION LIMITATIONS: community activity and occupation  PERSONAL FACTORS: Behavior pattern, Past/current experiences, Time since onset of injury/illness/exacerbation, and 3+ comorbidities: RRMS, cognitive impairment,  ICD implanted 08/23/24 for CHF, HTN, Neuropathy, Type 2 Diaebtes, hx of CVA are also affecting patient's functional outcome.   REHAB POTENTIAL: Good  CLINICAL DECISION MAKING: Stable/uncomplicated  EVALUATION COMPLEXITY: Low  PLAN:  PT FREQUENCY: 2x/week  PT DURATION: 4 weeks  PLANNED INTERVENTIONS: 97164- PT Re-evaluation, 97110-Therapeutic exercises, 97530- Therapeutic activity, 97112- Neuromuscular re-education, 97535- Self Care, 02859- Manual therapy, [phone]- Gait training, Patient/Family education, Balance training, Stair training, and DME instructions  PLAN FOR NEXT SESSION: initial HEP for RLE strength, staggered stance sit <> stands, SLS, balance with EC and head turns Work on high level balance and RLE strength during session, obstacles, step ups, hip extension    Thomas Nixon, PT, DPT 09/15/2024, 8:38 AM

## 2024-09-14 ENCOUNTER — Other Ambulatory Visit (HOSPITAL_COMMUNITY): Payer: Self-pay

## 2024-09-15 ENCOUNTER — Ambulatory Visit: Attending: Neurology | Admitting: Physical Therapy

## 2024-09-15 ENCOUNTER — Encounter: Payer: Self-pay | Admitting: Physical Therapy

## 2024-09-15 VITALS — BP 157/90 | HR 65

## 2024-09-15 DIAGNOSIS — R2681 Unsteadiness on feet: Secondary | ICD-10-CM | POA: Diagnosis not present

## 2024-09-15 DIAGNOSIS — G35D Multiple sclerosis, unspecified: Secondary | ICD-10-CM | POA: Diagnosis not present

## 2024-09-15 DIAGNOSIS — R2689 Other abnormalities of gait and mobility: Secondary | ICD-10-CM | POA: Insufficient documentation

## 2024-09-15 DIAGNOSIS — M6281 Muscle weakness (generalized): Secondary | ICD-10-CM | POA: Insufficient documentation

## 2024-09-19 ENCOUNTER — Ambulatory Visit: Admitting: Physical Therapy

## 2024-09-19 VITALS — BP 137/95 | HR 75

## 2024-09-19 DIAGNOSIS — G35D Multiple sclerosis, unspecified: Secondary | ICD-10-CM | POA: Diagnosis not present

## 2024-09-19 DIAGNOSIS — R2689 Other abnormalities of gait and mobility: Secondary | ICD-10-CM

## 2024-09-19 DIAGNOSIS — R2681 Unsteadiness on feet: Secondary | ICD-10-CM | POA: Diagnosis not present

## 2024-09-19 DIAGNOSIS — M6281 Muscle weakness (generalized): Secondary | ICD-10-CM

## 2024-09-19 NOTE — Therapy (Addendum)
 OUTPATIENT PHYSICAL THERAPY NEURO TREATMENT   Patient Name: Thomas Nixon MRN: [id number] DOB:26-Jan-1971, 53 y.o., male Today's Date: 09/19/2024   PCP: Ransom Other, MD  REFERRING PROVIDER: Gayland Lauraine PARAS, NP  END OF SESSION:  PT End of Session - 09/19/24 1101     Visit Number 2    Number of Visits 9    Date for Recertification  10/15/24    Authorization Type [employer]IC[employer] - needs auth    PT Start Time 0801    PT Stop Time 0845    PT Time Calculation (min) 44 min    Equipment Utilized During Treatment Gait belt    Activity Tolerance Patient tolerated treatment well    Behavior During Therapy WFL for tasks assessed/performed           Past Medical History:  Diagnosis Date   CVA (cerebral infarction)    Diabetes mellitus without complication (HCC)    Type 2   Encounter for long-term (current) use of other medications    Headache(784.0)    Hypertension    MS (multiple sclerosis)    possible   Neuropathy    Other and unspecified hyperlipidemia    Stroke (HCC)    5 strokes   Thoracic or lumbosacral neuritis or radiculitis, unspecified    Unspecified late effects of cerebrovascular disease    Unspecified sleep apnea    does not use cpap   Past Surgical History:  Procedure Laterality Date   ANTERIOR LAT LUMBAR FUSION N/A 08/30/2014   Procedure: ANTERIOR LATERAL LUMBAR FUSION 1 LEVEL;  Surgeon: Oneil Rodgers Priestly, MD;  Location: [facility] OR;  Service: Orthopedics;  Laterality: N/A;  Lumbar 3-4 lateral interbody fusion with instrumentation and allograft   BRAIN BIOPSY     CARDIAC DEFIBRILLATOR PLACEMENT     Sept 2025   ICD IMPLANT N/A 08/23/2024   Procedure: ICD IMPLANT;  Surgeon: Waddell Danelle ORN, MD;  Location: [facility] INVASIVE CV LAB;  Service: Cardiovascular;  Laterality: N/A;   Patient Active Problem List   Diagnosis Date Noted   Paroxysmal atrial fibrillation (HCC) 04/30/2022   Vitamin D  deficiency 06/11/2020   Gait abnormality 10/04/2017   Radiculopathy  08/30/2014   Multiple sclerosis 08/10/2014   Diabetes mellitus without complication (HCC)    Cerebral infarction [facility])    Thoracic or lumbosacral neuritis or radiculitis, unspecified    Other and unspecified hyperlipidemia    Encounter for long-term (current) use of other medications    Late effects of cerebrovascular disease    Sleep apnea    Thrombotic cerebral infarction (HCC) 06/07/2013   Dysarthria 06/01/2013   Hypertension 06/01/2013    ONSET DATE: 09/12/2024  REFERRING DIAG: H64.D (ICD-10-CM) - Multiple sclerosis  THERAPY DIAG:  Unsteadiness on feet  Muscle weakness (generalized)  Other abnormalities of gait and mobility  Rationale for Evaluation and Treatment: Rehabilitation  SUBJECTIVE:  SUBJECTIVE STATEMENT: Patient ambulates into clinic without AD. Patient states some soreness from initial session but feels good and ready for session today. Patient expressed that his wife would like to speak with us  on the phone to ensure everything he is doing today is ok for his chest because he has the ICD implantation at the beginning of October. Pt accompanied by: self  PERTINENT HISTORY: PMH: RRMS, cognitive impairment,  ICD implanted 08/23/24 for CHF, HTN, Neuropathy, Type 2 Diaebtes, hx of CVA  PAIN:  Are you having pain? No  Vitals:   09/19/24 0808  BP: (!) 137/95  Pulse: 75      PRECAUTIONS: ICD/Pacemaker and Other: no lifting over 10 pounds until about mid Nov. 2025 (recent ICD implant)  RED FLAGS: None   WEIGHT BEARING RESTRICTIONS: No  FALLS: Has patient fallen in last 6 months? No  LIVING ENVIRONMENT: Lives with: lives with their family and has 3 children  Lives in: House/apartment Stairs: Yes: Internal: 12 steps; on right going up and External: 1 steps; none Has  following equipment at home: walking stick   PLOF: Independent Still driving, not working   PATIENT GOALS: Wants to get his R leg stronger   OBJECTIVE:  Note: Objective measures were completed at Evaluation unless otherwise noted.  DIAGNOSTIC FINDINGS: MRI brain Feb 2025 IMPRESSION: MRI scan of the brain with and without contrast changes of periventricular, subcortical and periatrial T2/FLAIR white matter hyperintensities with differential discussed above.  This appears slightly progressed compared with previous MRI from 01/20/2022 no enhancing lesions are noted.   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     COGNITION: Overall cognitive status: Within functional limits for tasks assessed Cognitive impairment noted in pt's chart    SENSATION: Light touch: WFL  COORDINATION: Heel to shin: WNL    POSTURE: rounded shoulders and forward head   LOWER EXTREMITY MMT:    MMT Right Eval Left Eval  Hip flexion 4+ 5  Hip extension    Hip abduction 5 5  Hip adduction 5 5  Hip internal rotation    Hip external rotation    Knee flexion 5 5  Knee extension 4+ 5  Ankle dorsiflexion 5 5  Ankle plantarflexion    Ankle inversion    Ankle eversion    (Blank rows = not tested)    All tested in sitting   TRANSFERS: Sit to stand: Modified independence  Assistive device utilized: None     Stand to sit: Modified independence  Assistive device utilized: None      With LLE staggered slightly behind   GAIT: Findings: Gait Characteristics: step through pattern, decreased arm swing- Right, decreased stance time- Right, decreased stride length, decreased hip/knee flexion- Right, trunk flexed, and wide BOS, Distance walked: clinic distances , Assistive device utilized:None, Level of assistance: Modified independence, and Comments: decr hip extension noted   FUNCTIONAL TESTS:  5 times sit to stand: 14.8 seconds with no UE  support, pt slower with 4th rep 10 meter walk test: 12.85 seconds = 2.55 ft/sec   SLS: RLE: 5 seconds, LLE: 11.6 seconds    M-CTSIB  Condition 1: Firm Surface, EO 30 Sec, Normal Sway  Condition 2: Firm Surface, EC 30 Sec, Normal Sway  Condition 3: Foam Surface, EO 30 Sec, Normal Sway  Condition 4: Foam Surface, EC 20 Sec  TREATMENT DATE: 09/19/24   Therapeutic exercise, education: - HEP education and review, can add to HEP accordingly and as patient progresses - Education on chest precautions post ICD implatation - Standing calf raise at bar x 10 reps - Single leg calf raise at bar x 10 each side - Single leg balance at bar x accumulate 1 minute on each side - Staggered sit to stand, R LE back for increased quad activation, and hip extension, 2 sets x 5 reps, 1 set x 10 reps  NMR:  - Foam balance beam at parallel bars, down and back x 4 rounds, CGA, no correction needed, challenging for pt with cueing needed for hip and knee flexion - Single leg balance at bar x accumulate 1 minute on each side, SBA - Supine Glute bridge x 10 reps with emphasis on using R LE, activation of gluteal and hamstrings - Supine Single leg glute bridge, 2 sets x 5 each side, cueing for pushing through and using R LE  *Seated rest breaks throughout due to fatigue and to prevent high levels of fatigue.  PATIENT EDUCATION: Education details: See education in therapeutic exercise Person educated: Patient Education method: Explanation Education comprehension: verbalized understanding  HOME EXERCISE PROGRAM: Access Code: [email or web address] URL: [email or web address] Date: 09/19/2024 Prepared by: Sheffield Senate  Exercises: - Supine Bridge  - 1 x daily - 3 x weekly - 2 sets - 10 reps  - Standing Single Leg Stance with Counter Support  - 1 x daily - 3 x weekly - 3 sets  - 20 hold - Single Leg Heel Raise with Counter  Support  - 1 x daily - 3 x weekly - 2 sets - 10 reps   *Can add to HEP accordingly  GOALS: Goals reviewed with patient? Yes  SHORT TERM GOALS: ALL STGS = LTGS   LONG TERM GOALS: Target date: 10/13/2024  Pt will be independent with final HEP for strength, gait, balance in order to build upon functional gains made in therapy. Baseline:  Goal status: INITIAL  2.  Pt will improve FGA to at least a 24/30 in order to demo decr fall risk. Baseline: 19/30 Goal status: INITIAL  3.  Pt will improve condition 4 of mCTSIB to at least 30 seconds in order to demo improved vestibular input for balance.  Baseline: 20 seconds  Goal status: INITIAL  4.  Pt will improve gait speed with no AD to at least 3.0 ft/sec in order to demo improved community mobility.  Baseline: 12.85 seconds = 2.55 ft/sec  Goal status: INITIAL  5.  Pt will improve 5x sit<>stand to less than or equal to 12 sec to demonstrate improved functional strength and transfer efficiency.   Baseline: 14.8 seconds with no UE support Goal status: INITIAL   ASSESSMENT:  CLINICAL IMPRESSION: At start of clinic patient expressed that his wife was concerned with exercise and his implanted ICD. We reviewed precautions and assured them (and wife via phone) that we would not be using UE for lifting. Vitals were taken and all within appropriate range for exercise. No signs or symptoms of distress were notes and patient was monitored throughout session. Session today included HEP for patient and review in the clinic. Patient verbally understood and demonstrated all HEP safely and correctly. Session then continued with LE strengthening with emphasis on correct R LE positioning and activation of quadriceps and gluteal musculature especially during sit to stands and side stepping with a compliant surface. Balance tasks were challenging for patient with continued  need addressing dynamic balance and improving LE strength to reduce risk of falls and  improve ability to perform activities of daily living. Session was well tolerated with minimal fatigue noted. Rest breaks used throughout due to fatigue. Pt would benefit from skilled PT to address these impairments and functional limitations to maximize functional mobility independence. Continue per POC.   OBJECTIVE IMPAIRMENTS: Abnormal gait, decreased activity tolerance, decreased balance, decreased cognition, decreased mobility, decreased strength, and postural dysfunction.   ACTIVITY LIMITATIONS: stairs and locomotion level  PARTICIPATION LIMITATIONS: community activity and occupation  PERSONAL FACTORS: Behavior pattern, Past/current experiences, Time since onset of injury/illness/exacerbation, and 3+ comorbidities: RRMS, cognitive impairment,  ICD implanted 08/23/24 for CHF, HTN, Neuropathy, Type 2 Diaebtes, hx of CVA are also affecting patient's functional outcome.   REHAB POTENTIAL: Good  CLINICAL DECISION MAKING: Stable/uncomplicated  EVALUATION COMPLEXITY: Low  PLAN:  PT FREQUENCY: 2x/week  PT DURATION: 4 weeks  PLANNED INTERVENTIONS: 97164- PT Re-evaluation, 97110-Therapeutic exercises, 97530- Therapeutic activity, 97112- Neuromuscular re-education, 97535- Self Care, 02859- Manual therapy, [phone]- Gait training, Patient/Family education, Balance training, Stair training, and DME instructions  PLAN FOR NEXT SESSION: HEP should be added and updated as patient tolerates and progresses (I.e. sit to stand, standing hip abduction). Continue SLS (add foam or ball throws-he likes basketball), balance with EC and head turns, include steps ups and banded STS for improved hip extension. *no lifting over 10 pounds until about mid Nov. 2025 (recent ICD implant)  Emmalene Sherry, Student-PT, DPT 09/19/2024, 11:23 AM

## 2024-09-21 ENCOUNTER — Encounter: Payer: Self-pay | Admitting: Physical Therapy

## 2024-09-21 ENCOUNTER — Ambulatory Visit: Admitting: Physical Therapy

## 2024-09-21 VITALS — BP 152/98 | HR 79

## 2024-09-21 DIAGNOSIS — R2681 Unsteadiness on feet: Secondary | ICD-10-CM

## 2024-09-21 DIAGNOSIS — M6281 Muscle weakness (generalized): Secondary | ICD-10-CM | POA: Diagnosis not present

## 2024-09-21 DIAGNOSIS — R2689 Other abnormalities of gait and mobility: Secondary | ICD-10-CM | POA: Diagnosis not present

## 2024-09-21 DIAGNOSIS — G35D Multiple sclerosis, unspecified: Secondary | ICD-10-CM | POA: Diagnosis not present

## 2024-09-21 NOTE — Therapy (Signed)
 OUTPATIENT PHYSICAL THERAPY NEURO TREATMENT   Patient Name: Thomas Nixon MRN: [id number] DOB:1971-03-14, 53 y.o., male Today's Date: 09/21/2024   PCP: Ransom Other, MD  REFERRING PROVIDER: Gayland Lauraine PARAS, NP  END OF SESSION:  PT End of Session - 09/21/24 0804     Visit Number 3    Number of Visits 9    Date for Recertification  10/15/24    Authorization Type [employer] - needs auth    Authorization Time Period 09/15/24-12/14/24    Authorization - Visit Number 3    Authorization - Number of Visits 9    PT Start Time 0803    PT Stop Time 0847    PT Time Calculation (min) 44 min    Equipment Utilized During Treatment Gait belt    Activity Tolerance Patient tolerated treatment well    Behavior During Therapy WFL for tasks assessed/performed           Past Medical History:  Diagnosis Date   CVA (cerebral infarction)    Diabetes mellitus without complication (HCC)    Type 2   Encounter for long-term (current) use of other medications    Headache(784.0)    Hypertension    MS (multiple sclerosis)    possible   Neuropathy    Other and unspecified hyperlipidemia    Stroke (HCC)    5 strokes   Thoracic or lumbosacral neuritis or radiculitis, unspecified    Unspecified late effects of cerebrovascular disease    Unspecified sleep apnea    does not use cpap   Past Surgical History:  Procedure Laterality Date   ANTERIOR LAT LUMBAR FUSION N/A 08/30/2014   Procedure: ANTERIOR LATERAL LUMBAR FUSION 1 LEVEL;  Surgeon: Oneil Rodgers Priestly, MD;  Location: [facility] OR;  Service: Orthopedics;  Laterality: N/A;  Lumbar 3-4 lateral interbody fusion with instrumentation and allograft   BRAIN BIOPSY     CARDIAC DEFIBRILLATOR PLACEMENT     Sept 2025   ICD IMPLANT N/A 08/23/2024   Procedure: ICD IMPLANT;  Surgeon: Waddell Danelle ORN, MD;  Location: [facility] INVASIVE CV LAB;  Service: Cardiovascular;  Laterality: N/A;   Patient Active Problem List   Diagnosis Date Noted   Paroxysmal  atrial fibrillation (HCC) 04/30/2022   Vitamin D  deficiency 06/11/2020   Gait abnormality 10/04/2017   Radiculopathy 08/30/2014   Multiple sclerosis 08/10/2014   Diabetes mellitus without complication (HCC)    Cerebral infarction [facility])    Thoracic or lumbosacral neuritis or radiculitis, unspecified    Other and unspecified hyperlipidemia    Encounter for long-term (current) use of other medications    Late effects of cerebrovascular disease    Sleep apnea    Thrombotic cerebral infarction (HCC) 06/07/2013   Dysarthria 06/01/2013   Hypertension 06/01/2013    ONSET DATE: 09/12/2024  REFERRING DIAG: H64.D (ICD-10-CM) - Multiple sclerosis  THERAPY DIAG:  Unsteadiness on feet  Muscle weakness (generalized)  Other abnormalities of gait and mobility  Rationale for Evaluation and Treatment: Rehabilitation  SUBJECTIVE:  SUBJECTIVE STATEMENT: No recent falls reported.  Ambulated into clinic without AD use.  Pt reports everything is wonderful and no changes since last visit.  Pt has been keeping his lifting precautions. Pt accompanied by: self; drove self  PERTINENT HISTORY: PMH: RRMS, cognitive impairment,  ICD implanted 08/23/24 for CHF, HTN, Neuropathy, Type 2 Diaebtes, hx of CVA  PAIN:  Are you having pain? No  PRECAUTIONS: ICD/Pacemaker and Other: no lifting over 10 pounds until about mid Nov. 2025 (recent ICD implant)  RED FLAGS: None   WEIGHT BEARING RESTRICTIONS: No  FALLS: Has patient fallen in last 6 months? No  LIVING ENVIRONMENT: Lives with: lives with their family and has 3 children  Lives in: House/apartment Stairs: Yes: Internal: 12 steps; on right going up and External: 1 steps; none Has following equipment at home: walking stick   PLOF: Independent Still driving, not  working   PATIENT GOALS: Wants to get his R leg stronger   OBJECTIVE:  Note: Objective measures were completed at Evaluation unless otherwise noted.  DIAGNOSTIC FINDINGS: MRI brain Feb 2025 IMPRESSION: MRI scan of the brain with and without contrast changes of periventricular, subcortical and periatrial T2/FLAIR white matter hyperintensities with differential discussed above.  This appears slightly progressed compared with previous MRI from 01/20/2022 no enhancing lesions are noted.   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     COGNITION: Overall cognitive status: Within functional limits for tasks assessed Cognitive impairment noted in pt's chart    SENSATION: Light touch: WFL  COORDINATION: Heel to shin: WNL    POSTURE: rounded shoulders and forward head   LOWER EXTREMITY MMT:    MMT Right Eval Left Eval  Hip flexion 4+ 5  Hip extension    Hip abduction 5 5  Hip adduction 5 5  Hip internal rotation    Hip external rotation    Knee flexion 5 5  Knee extension 4+ 5  Ankle dorsiflexion 5 5  Ankle plantarflexion    Ankle inversion    Ankle eversion    (Blank rows = not tested)    All tested in sitting   TRANSFERS: Sit to stand: Modified independence  Assistive device utilized: None     Stand to sit: Modified independence  Assistive device utilized: None      With LLE staggered slightly behind   GAIT: Findings: Gait Characteristics: step through pattern, decreased arm swing- Right, decreased stance time- Right, decreased stride length, decreased hip/knee flexion- Right, trunk flexed, and wide BOS, Distance walked: clinic distances , Assistive device utilized:None, Level of assistance: Modified independence, and Comments: decr hip extension noted   FUNCTIONAL TESTS:  5 times sit to stand: 14.8 seconds with no UE support, pt slower with 4th rep 10 meter walk test: 12.85 seconds = 2.55 ft/sec   SLS:  RLE: 5 seconds, LLE: 11.6 seconds    M-CTSIB  Condition 1: Firm Surface, EO 30 Sec, Normal Sway  Condition 2: Firm Surface, EC 30 Sec, Normal Sway  Condition 3: Foam Surface, EO 30 Sec, Normal Sway  Condition 4: Foam Surface, EC 20 Sec  TREATMENT DATE: 09/21/24  Self Care: BP and HR taken in sitting at rest beginning and end of session (see below for details).  Pt reports feeling BP is higher d/t rushing to get here for therapy appt. Vitals:   09/21/24 0807  BP: (!) 152/98  Pulse: 79  Beginning of session: BP 158/108 with HR 79 bpm via electronic BP machine; BP 152/98 via manual BP EOS:138/96 BP (manual); HR 72 bpm  Therapeutic Activity: Standing on Airex at steps: 3x10 reps B LE toe taps to 6 inch step; no UE support; sitting rest breaks between; pt needing bathroom break between 2nd and 3rd trial; CGA to min assist for balance; (reported RPE of 5/10 1st trial and 1/10 2nd and 3rd trial) Step ups: no UE support; trialed 6 inch step with R LE but pt unable to perform step up so switched to 4 inch step: 2x10 step ups to 4 inch step B LE's (at ballet bars for safety but no UE support) with 3/10 RPE; vc's for glute/hip extensor activation; pt given sitting rest breaks between (pt taking brief phone call from daughter between trials) Static standing (firm surface): feet together; EC 2x30 seconds; CGA for safety; mild sway noted; 2/10 RPE  PATIENT EDUCATION: Education details: Continue HEP.  Reviewed ICD precautions. Person educated: Patient Education method: Explanation Education comprehension: verbalized understanding  HOME EXERCISE PROGRAM: Access Code: [email or web address] URL: [email or web address] Date: 09/19/2024 Prepared by: Sheffield Senate  Exercises: - Supine Bridge  - 1 x daily - 3 x weekly - 2 sets - 10 reps  - Standing Single Leg Stance with Counter Support  - 1 x daily - 3 x weekly - 3  sets  - 20 hold - Single Leg Heel Raise with Counter Support  - 1 x daily - 3 x weekly - 2 sets - 10 reps   *Can add to HEP accordingly  GOALS: Goals reviewed with patient? Yes  SHORT TERM GOALS: ALL STGS = LTGS   LONG TERM GOALS: Target date: 10/13/2024  Pt will be independent with final HEP for strength, gait, balance in order to build upon functional gains made in therapy. Baseline:  Goal status: INITIAL  2.  Pt will improve FGA to at least a 24/30 in order to demo decr fall risk. Baseline: 19/30 Goal status: INITIAL  3.  Pt will improve condition 4 of mCTSIB to at least 30 seconds in order to demo improved vestibular input for balance.  Baseline: 20 seconds  Goal status: INITIAL  4.  Pt will improve gait speed with no AD to at least 3.0 ft/sec in order to demo improved community mobility.  Baseline: 12.85 seconds = 2.55 ft/sec  Goal status: INITIAL  5.  Pt will improve 5x sit<>stand to less than or equal to 12 sec to demonstrate improved functional strength and transfer efficiency.   Baseline: 14.8 seconds with no UE support Goal status: INITIAL   ASSESSMENT:  CLINICAL IMPRESSION: Patient was seen today for physical therapy treatment to address balance and LE strengthening.  Focused session on SLS on compliant surface with dynamic activity, standing balance on firm surface with narrow BOS and eyes closed, and functional strengthening via step ups on 4 inch step (pt unable to perform on 6 inch step with R LE without UE support d/t weakness).  Patient continues to be limited by balance, aerobic activity tolerance, and weakness.  They demonstrate improvement in single leg balance on compliant surface with repetition.  Pt given sitting rest breaks and paced  during sessions activities; no signs/symptoms of distress or any adverse symptoms noted during session.  They would continue to benefit from skilled PT to address impairments as noted and progress towards long term  goals.   OBJECTIVE IMPAIRMENTS: Abnormal gait, decreased activity tolerance, decreased balance, decreased cognition, decreased mobility, decreased strength, and postural dysfunction.   ACTIVITY LIMITATIONS: stairs and locomotion level  PARTICIPATION LIMITATIONS: community activity and occupation  PERSONAL FACTORS: Behavior pattern, Past/current experiences, Time since onset of injury/illness/exacerbation, and 3+ comorbidities: RRMS, cognitive impairment,  ICD implanted 08/23/24 for CHF, HTN, Neuropathy, Type 2 Diaebtes, hx of CVA are also affecting patient's functional outcome.   REHAB POTENTIAL: Good  CLINICAL DECISION MAKING: Stable/uncomplicated  EVALUATION COMPLEXITY: Low  PLAN:  PT FREQUENCY: 2x/week  PT DURATION: 4 weeks  PLANNED INTERVENTIONS: 97164- PT Re-evaluation, 97110-Therapeutic exercises, 97530- Therapeutic activity, 97112- Neuromuscular re-education, 97535- Self Care, 02859- Manual therapy, [phone]- Gait training, Patient/Family education, Balance training, Stair training, and DME instructions  PLAN FOR NEXT SESSION: HEP should be added and updated as patient tolerates and progresses (I.e. sit to stand, standing hip abduction). Continue SLS (add foam or ball throws-he likes basketball), balance with EC and head turns, include steps ups and banded STS for improved hip extension.  Pt requesting to use bicycle end of session (for LE strengthening/aerobic endurance). *no lifting over 10 pounds until about mid Nov. 2025 (recent ICD implant)  Damien Caulk, PT 09/21/2024, 9:10 AM

## 2024-09-26 ENCOUNTER — Ambulatory Visit: Attending: Neurology | Admitting: Physical Therapy

## 2024-09-26 VITALS — BP 134/90 | HR 78

## 2024-09-26 DIAGNOSIS — R2681 Unsteadiness on feet: Secondary | ICD-10-CM | POA: Diagnosis not present

## 2024-09-26 DIAGNOSIS — M6281 Muscle weakness (generalized): Secondary | ICD-10-CM | POA: Diagnosis not present

## 2024-09-26 DIAGNOSIS — R2689 Other abnormalities of gait and mobility: Secondary | ICD-10-CM | POA: Diagnosis not present

## 2024-09-26 NOTE — Therapy (Signed)
 OUTPATIENT PHYSICAL THERAPY NEURO TREATMENT   Patient Name: Thomas Nixon MRN: [id number] DOB:07-25-1971, 53 y.o., male Today's Date: 09/26/2024   PCP: Ransom Other, MD  REFERRING PROVIDER: Gayland Lauraine PARAS, NP  END OF SESSION:  PT End of Session - 09/26/24 0850     Visit Number 4    Number of Visits 9    Date for Recertification  10/15/24    Authorization Type [employer] - needs auth    Authorization Time Period 09/15/24-12/14/24    Authorization - Number of Visits 9    PT Start Time 0803    PT Stop Time 0845    PT Time Calculation (min) 42 min    Equipment Utilized During Treatment Gait belt    Activity Tolerance Patient tolerated treatment well    Behavior During Therapy WFL for tasks assessed/performed            Past Medical History:  Diagnosis Date   CVA (cerebral infarction)    Diabetes mellitus without complication (HCC)    Type 2   Encounter for long-term (current) use of other medications    Headache(784.0)    Hypertension    MS (multiple sclerosis)    possible   Neuropathy    Other and unspecified hyperlipidemia    Stroke (HCC)    5 strokes   Thoracic or lumbosacral neuritis or radiculitis, unspecified    Unspecified late effects of cerebrovascular disease    Unspecified sleep apnea    does not use cpap   Past Surgical History:  Procedure Laterality Date   ANTERIOR LAT LUMBAR FUSION N/A 08/30/2014   Procedure: ANTERIOR LATERAL LUMBAR FUSION 1 LEVEL;  Surgeon: Oneil Rodgers Priestly, MD;  Location: [facility] OR;  Service: Orthopedics;  Laterality: N/A;  Lumbar 3-4 lateral interbody fusion with instrumentation and allograft   BRAIN BIOPSY     CARDIAC DEFIBRILLATOR PLACEMENT     Sept 2025   ICD IMPLANT N/A 08/23/2024   Procedure: ICD IMPLANT;  Surgeon: Waddell Danelle ORN, MD;  Location: [facility] INVASIVE CV LAB;  Service: Cardiovascular;  Laterality: N/A;   Patient Active Problem List   Diagnosis Date Noted   Paroxysmal atrial fibrillation (HCC) 04/30/2022    Vitamin D  deficiency 06/11/2020   Gait abnormality 10/04/2017   Radiculopathy 08/30/2014   Multiple sclerosis 08/10/2014   Diabetes mellitus without complication (HCC)    Cerebral infarction [facility])    Thoracic or lumbosacral neuritis or radiculitis, unspecified    Other and unspecified hyperlipidemia    Encounter for long-term (current) use of other medications    Late effects of cerebrovascular disease    Sleep apnea    Thrombotic cerebral infarction (HCC) 06/07/2013   Dysarthria 06/01/2013   Hypertension 06/01/2013    ONSET DATE: 09/12/2024  REFERRING DIAG: H64.D (ICD-10-CM) - Multiple sclerosis  THERAPY DIAG:  Unsteadiness on feet  Muscle weakness (generalized)  Other abnormalities of gait and mobility  Rationale for Evaluation and Treatment: Rehabilitation  SUBJECTIVE:  SUBJECTIVE STATEMENT: Patient ambulates in with no AD. Patient reports feeling good and no soreness from last session. Patient denies falls. Patient states they have been completing some home exercise like the calf raises. Pt accompanied by: self; drove self  PERTINENT HISTORY: PMH: RRMS, cognitive impairment,  ICD implanted 08/23/24 for CHF, HTN, Neuropathy, Type 2 Diaebtes, hx of CVA  PAIN:  Are you having pain? No  PRECAUTIONS: ICD/Pacemaker and Other: no lifting over 10 pounds until about mid Nov. 2025 (recent ICD implant)  RED FLAGS: None   WEIGHT BEARING RESTRICTIONS: No  FALLS: Has patient fallen in last 6 months? No  LIVING ENVIRONMENT: Lives with: lives with their family and has 3 children  Lives in: House/apartment Stairs: Yes: Internal: 12 steps; on right going up and External: 1 steps; none Has following equipment at home: walking stick   PLOF: Independent Still driving, not working   PATIENT  GOALS: Wants to get his R leg stronger   OBJECTIVE:  Note: Objective measures were completed at Evaluation unless otherwise noted.  DIAGNOSTIC FINDINGS: MRI brain Feb 2025 IMPRESSION: MRI scan of the brain with and without contrast changes of periventricular, subcortical and periatrial T2/FLAIR white matter hyperintensities with differential discussed above.  This appears slightly progressed compared with previous MRI from 01/20/2022 no enhancing lesions are noted.   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     COGNITION: Overall cognitive status: Within functional limits for tasks assessed Cognitive impairment noted in pt's chart    SENSATION: Light touch: WFL  COORDINATION: Heel to shin: WNL    POSTURE: rounded shoulders and forward head   LOWER EXTREMITY MMT:    MMT Right Eval Left Eval  Hip flexion 4+ 5  Hip extension    Hip abduction 5 5  Hip adduction 5 5  Hip internal rotation    Hip external rotation    Knee flexion 5 5  Knee extension 4+ 5  Ankle dorsiflexion 5 5  Ankle plantarflexion    Ankle inversion    Ankle eversion    (Blank rows = not tested)    All tested in sitting   TRANSFERS: Sit to stand: Modified independence  Assistive device utilized: None     Stand to sit: Modified independence  Assistive device utilized: None      With LLE staggered slightly behind   GAIT: Findings: Gait Characteristics: step through pattern, decreased arm swing- Right, decreased stance time- Right, decreased stride length, decreased hip/knee flexion- Right, trunk flexed, and wide BOS, Distance walked: clinic distances , Assistive device utilized:None, Level of assistance: Modified independence, and Comments: decr hip extension noted   FUNCTIONAL TESTS:  5 times sit to stand: 14.8 seconds with no UE support, pt slower with 4th rep 10 meter walk test: 12.85 seconds = 2.55 ft/sec   SLS: RLE: 5 seconds, LLE:  11.6 seconds    M-CTSIB  Condition 1: Firm Surface, EO 30 Sec, Normal Sway  Condition 2: Firm Surface, EC 30 Sec, Normal Sway  Condition 3: Foam Surface, EO 30 Sec, Normal Sway  Condition 4: Foam Surface, EC 20 Sec  TREATMENT DATE: 09/21/24  Therapeutic Activity: BP and HR taken in sitting at rest beginning and end of session (see below for details).  Vitals:   09/26/24 0809  BP: (!) 134/90  Pulse: 78  Step up to foam pad off of small ramp in parallel bars, 3 sets x 6 total reps, challenging, cueing for L toe clearance, need reminder what foot to step with Ambulation x 115 ft cueing for L toe up, high knees and hip flexion Ambulation x 345 ft (3 laps) with purple band resistance  NMR: Rhomberg on foam/compliant surface x 30 seconds Rhomberg on foam w/head turns x 30 seconds Rhomberg on foam EC w/head turns x 30 seconds Semi tandem x each foot in front: Compliant surface x 30 seconds Compliant surface w/head turns x 30 seconds Compliant surface, EC w/head turns x 30 seconds Hurdles of varying height in parallel bars (foam at front of obstacle), down and back x 4, CGA, no correction needed and no LOB Round 1 step to, rounds 2-4 step through with hesitation when approaching foam  PATIENT EDUCATION: Education details: Continue HEP, walking at home with walking stick and a walking partner Person educated: Patient Education method: Explanation Education comprehension: verbalized understanding  HOME EXERCISE PROGRAM: Access Code: [email or web address] URL: [email or web address] Date: 09/19/2024 Prepared by: Sheffield Senate  Exercises: - Supine Bridge  - 1 x daily - 3 x weekly - 2 sets - 10 reps  - Standing Single Leg Stance with Counter Support  - 1 x daily - 3 x weekly - 3 sets  - 20 hold - Single Leg Heel Raise with Counter Support  - 1 x daily - 3 x weekly - 2 sets - 10 reps   *Can add to  HEP accordingly  GOALS: Goals reviewed with patient? Yes  SHORT TERM GOALS: ALL STGS = LTGS   LONG TERM GOALS: Target date: 10/13/2024  Pt will be independent with final HEP for strength, gait, balance in order to build upon functional gains made in therapy. Baseline:  Goal status: INITIAL  2.  Pt will improve FGA to at least a 24/30 in order to demo decr fall risk. Baseline: 19/30 Goal status: INITIAL  3.  Pt will improve condition 4 of mCTSIB to at least 30 seconds in order to demo improved vestibular input for balance.  Baseline: 20 seconds  Goal status: INITIAL  4.  Pt will improve gait speed with no AD to at least 3.0 ft/sec in order to demo improved community mobility.  Baseline: 12.85 seconds = 2.55 ft/sec  Goal status: INITIAL  5.  Pt will improve 5x sit<>stand to less than or equal to 12 sec to demonstrate improved functional strength and transfer efficiency.   Baseline: 14.8 seconds with no UE support Goal status: INITIAL   ASSESSMENT:  CLINICAL IMPRESSION: Patient was seen today for skilled physical therapy with an emphasis on dynamic balance and LE strengthening for improved functional strength, balance and to reduce risk of falls. Patient tolerated session well with minimal increase in fatigue. Rest breaks used throughout due to fatigue. Rhomberg and semi-tandem on complaint surface with head turns and EC was challenging for the patient with CGA and some corrections needed throughout. Hurdles were well tolerated but challenging with CGA needed throughout. Patient made improvements with each round of balance tasks. Ambulation was cued for L toe clearance and patient was challenged by resisted ambulation. Patient continues to benefit from skilled PT to address impairments as noted and progress towards long term goals.  OBJECTIVE IMPAIRMENTS: Abnormal gait, decreased activity tolerance, decreased balance, decreased cognition, decreased mobility, decreased strength, and  postural dysfunction.   ACTIVITY LIMITATIONS: stairs and locomotion level  PARTICIPATION LIMITATIONS: community activity and occupation  PERSONAL FACTORS: Behavior pattern, Past/current experiences, Time since onset of injury/illness/exacerbation, and 3+ comorbidities: RRMS, cognitive impairment,  ICD implanted 08/23/24 for CHF, HTN, Neuropathy, Type 2 Diaebtes, hx of CVA are also affecting patient's functional outcome.   REHAB POTENTIAL: Good  CLINICAL DECISION MAKING: Stable/uncomplicated  EVALUATION COMPLEXITY: Low  PLAN:  PT FREQUENCY: 2x/week  PT DURATION: 4 weeks  PLANNED INTERVENTIONS: 97164- PT Re-evaluation, 97110-Therapeutic exercises, 97530- Therapeutic activity, 97112- Neuromuscular re-education, 97535- Self Care, 02859- Manual therapy, [phone]- Gait training, Patient/Family education, Balance training, Stair training, and DME instructions  PLAN FOR NEXT SESSION: HEP should be added and updated as patient tolerates and progresses (I.e. sit to stand, standing hip abduction). Continue SLS (add foam or ball throws-he likes basketball), balance with EC and head turns, include steps ups and banded STS for improved hip extension.  Pt requesting to use bicycle end of session (for LE strengthening/aerobic endurance). *no lifting over 10 pounds until about mid Nov. 2025 (recent ICD implant)  Emmalene Sherry, Student-PT 09/26/2024, 9:18 AM

## 2024-09-28 ENCOUNTER — Ambulatory Visit: Admitting: Physical Therapy

## 2024-09-28 VITALS — BP 120/89 | HR 76

## 2024-09-28 DIAGNOSIS — R2681 Unsteadiness on feet: Secondary | ICD-10-CM

## 2024-09-28 DIAGNOSIS — M6281 Muscle weakness (generalized): Secondary | ICD-10-CM | POA: Diagnosis not present

## 2024-09-28 DIAGNOSIS — R2689 Other abnormalities of gait and mobility: Secondary | ICD-10-CM | POA: Diagnosis not present

## 2024-09-28 NOTE — Therapy (Signed)
 OUTPATIENT PHYSICAL THERAPY NEURO TREATMENT   Patient Name: Thomas Nixon MRN: [id number] DOB:1971-08-27, 53 y.o., male Today's Date: 09/28/2024   PCP: Ransom Other, MD  REFERRING PROVIDER: Gayland Lauraine PARAS, NP  END OF SESSION:  PT End of Session - 09/28/24 0849     Visit Number 5    Number of Visits 9    Date for Recertification  10/15/24    Authorization Type [employer]IC[employer] - needs auth    Authorization Time Period 09/15/24-12/14/24    Authorization - Number of Visits 9    PT Start Time 0801    PT Stop Time 0844    PT Time Calculation (min) 43 min    Equipment Utilized During Treatment Gait belt    Activity Tolerance Patient tolerated treatment well    Behavior During Therapy WFL for tasks assessed/performed             Past Medical History:  Diagnosis Date   CVA (cerebral infarction)    Diabetes mellitus without complication (HCC)    Type 2   Encounter for long-term (current) use of other medications    Headache(784.0)    Hypertension    MS (multiple sclerosis)    possible   Neuropathy    Other and unspecified hyperlipidemia    Stroke (HCC)    5 strokes   Thoracic or lumbosacral neuritis or radiculitis, unspecified    Unspecified late effects of cerebrovascular disease    Unspecified sleep apnea    does not use cpap   Past Surgical History:  Procedure Laterality Date   ANTERIOR LAT LUMBAR FUSION N/A 08/30/2014   Procedure: ANTERIOR LATERAL LUMBAR FUSION 1 LEVEL;  Surgeon: Oneil Rodgers Priestly, MD;  Location: [facility] OR;  Service: Orthopedics;  Laterality: N/A;  Lumbar 3-4 lateral interbody fusion with instrumentation and allograft   BRAIN BIOPSY     CARDIAC DEFIBRILLATOR PLACEMENT     Sept 2025   ICD IMPLANT N/A 08/23/2024   Procedure: ICD IMPLANT;  Surgeon: Waddell Danelle ORN, MD;  Location: [facility] INVASIVE CV LAB;  Service: Cardiovascular;  Laterality: N/A;   Patient Active Problem List   Diagnosis Date Noted   Paroxysmal atrial fibrillation (HCC)  04/30/2022   Vitamin D  deficiency 06/11/2020   Gait abnormality 10/04/2017   Radiculopathy 08/30/2014   Multiple sclerosis 08/10/2014   Diabetes mellitus without complication (HCC)    Cerebral infarction [facility])    Thoracic or lumbosacral neuritis or radiculitis, unspecified    Other and unspecified hyperlipidemia    Encounter for long-term (current) use of other medications    Late effects of cerebrovascular disease    Sleep apnea    Thrombotic cerebral infarction (HCC) 06/07/2013   Dysarthria 06/01/2013   Hypertension 06/01/2013    ONSET DATE: 09/12/2024  REFERRING DIAG: H64.D (ICD-10-CM) - Multiple sclerosis  THERAPY DIAG:  Unsteadiness on feet  Muscle weakness (generalized)  Other abnormalities of gait and mobility  Rationale for Evaluation and Treatment: Rehabilitation  SUBJECTIVE:  SUBJECTIVE STATEMENT:  Patient ambulates into clinic without AD. He reports feeling good and didn't have an increase in fatigue after last session. He feels good overall. Patient denies falls. Pt accompanied by: self; drove self  PERTINENT HISTORY: PMH: RRMS, cognitive impairment,  ICD implanted 08/23/24 for CHF, HTN, Neuropathy, Type 2 Diaebtes, hx of CVA  PAIN:  Are you having pain? No  PRECAUTIONS: ICD/Pacemaker and Other: no lifting over 10 pounds until about mid Nov. 2025 (recent ICD implant)  RED FLAGS: None   WEIGHT BEARING RESTRICTIONS: No  FALLS: Has patient fallen in last 6 months? No  LIVING ENVIRONMENT: Lives with: lives with their family and has 3 children  Lives in: House/apartment Stairs: Yes: Internal: 12 steps; on right going up and External: 1 steps; none Has following equipment at home: walking stick   PLOF: Independent Still driving, not working   PATIENT GOALS: Wants to  get his R leg stronger   OBJECTIVE:  Note: Objective measures were completed at Evaluation unless otherwise noted.  DIAGNOSTIC FINDINGS: MRI brain Feb 2025 IMPRESSION: MRI scan of the brain with and without contrast changes of periventricular, subcortical and periatrial T2/FLAIR white matter hyperintensities with differential discussed above.  This appears slightly progressed compared with previous MRI from 01/20/2022 no enhancing lesions are noted.   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     COGNITION: Overall cognitive status: Within functional limits for tasks assessed Cognitive impairment noted in pt's chart    SENSATION: Light touch: WFL  COORDINATION: Heel to shin: WNL    POSTURE: rounded shoulders and forward head   LOWER EXTREMITY MMT:    MMT Right Eval Left Eval  Hip flexion 4+ 5  Hip extension    Hip abduction 5 5  Hip adduction 5 5  Hip internal rotation    Hip external rotation    Knee flexion 5 5  Knee extension 4+ 5  Ankle dorsiflexion 5 5  Ankle plantarflexion    Ankle inversion    Ankle eversion    (Blank rows = not tested)    All tested in sitting   TRANSFERS: Sit to stand: Modified independence  Assistive device utilized: None     Stand to sit: Modified independence  Assistive device utilized: None      With LLE staggered slightly behind   GAIT: Findings: Gait Characteristics: step through pattern, decreased arm swing- Right, decreased stance time- Right, decreased stride length, decreased hip/knee flexion- Right, trunk flexed, and wide BOS, Distance walked: clinic distances , Assistive device utilized:None, Level of assistance: Modified independence, and Comments: decr hip extension noted   FUNCTIONAL TESTS:  5 times sit to stand: 14.8 seconds with no UE support, pt slower with 4th rep 10 meter walk test: 12.85 seconds = 2.55 ft/sec   SLS: RLE: 5 seconds, LLE: 11.6 seconds     M-CTSIB  Condition 1: Firm Surface, EO 30 Sec, Normal Sway  Condition 2: Firm Surface, EC 30 Sec, Normal Sway  Condition 3: Foam Surface, EO 30 Sec, Normal Sway  Condition 4: Foam Surface, EC 20 Sec  TREATMENT DATE: 09/28/24  Therapeutic Activity, LE strengthening, functional strength, cueing for DF, hip flexion: BP and HR taken in sitting at rest beginning and end of session (see below for details).  Vitals:   09/28/24 0806  BP: 120/89  Pulse: 76   Resisted band gait (purple band) x 345 ft(3 laps), 5/10 fatigue CGA and PT provided resistance with random perturbations and successful recovery by patient. Sit to stand x 5 reps Sit to stand w/ 8# ballslam x 5 reps  Sit to stand on foam w/8# ballslam, 2 sets x 10 reps (some able to catch, most had to recover ball)  NMR: Blazepods foot hits, 6 pods with 5 distracting colors, blue as focus color, 2 sets x 4 minutes each Set 1: forward stepping to blue pod for 2 minutes, then lateral stepping for 2 mins, 30 hits Set 2: forward and backward walking with lateral stepping hits, 24 hits Cueing for speed, quick reaction, DF and hip flexion to hit pod, keeping feet in line w/lateral stepping Standing rest between sets due to fatigue Weighted step taps at stairs (3 pound ankle wts) x 2 minutes 4 circle pads on steps at different heights and calling out random color to tap with foot *CGA used for all treatment with minimal correction needed - this has improved since last session.   PATIENT EDUCATION: Education details: Continue HEP, walking at home with walking stick and a walking partner Person educated: Patient Education method: Explanation Education comprehension: verbalized understanding  HOME EXERCISE PROGRAM: Access Code: [email or web address] URL: [email or web address] Date: 09/19/2024 Prepared by: Sheffield Senate  Exercises: - Supine Bridge  - 1 x  daily - 3 x weekly - 2 sets - 10 reps  - Standing Single Leg Stance with Counter Support  - 1 x daily - 3 x weekly - 3 sets  - 20 hold - Single Leg Heel Raise with Counter Support  - 1 x daily - 3 x weekly - 2 sets - 10 reps   *Can add to HEP accordingly  GOALS: Goals reviewed with patient? Yes  SHORT TERM GOALS: ALL STGS = LTGS   LONG TERM GOALS: Target date: 10/13/2024  Pt will be independent with final HEP for strength, gait, balance in order to build upon functional gains made in therapy. Baseline:  Goal status: INITIAL  2.  Pt will improve FGA to at least a 24/30 in order to demo decr fall risk. Baseline: 19/30 Goal status: INITIAL  3.  Pt will improve condition 4 of mCTSIB to at least 30 seconds in order to demo improved vestibular input for balance.  Baseline: 20 seconds  Goal status: INITIAL  4.  Pt will improve gait speed with no AD to at least 3.0 ft/sec in order to demo improved community mobility.  Baseline: 12.85 seconds = 2.55 ft/sec  Goal status: INITIAL  5.  Pt will improve 5x sit<>stand to less than or equal to 12 sec to demonstrate improved functional strength and transfer efficiency.   Baseline: 14.8 seconds with no UE support Goal status: INITIAL   ASSESSMENT:  CLINICAL IMPRESSION: Patient was seen today for skilled physical therapy with an emphasis on LE strengthening, reaction time, and dynamic balance to reduce above deficits and risk of falls. Patient tolerated session well with minimal increase in fatigue and rest breaks were used as needed. Reaction time and quick stepping was improved with blazepods with inclusion of lateral and backward walking which are both challenging due to decrease step length and foot clearance.  Sit to stands with ballslams were challenging for patient with added somatosensory component with compliant surface. Patient made improvements with each round of balance and tasks. Throughout session patient was cued on L toe clearance,  toe push off and hip flexion. CGA used throughout with little to no correction needed which has improved since last session. Patient continues to benefit from skilled PT to address impairments as noted and progress towards long term goals.   OBJECTIVE IMPAIRMENTS: Abnormal gait, decreased activity tolerance, decreased balance, decreased cognition, decreased mobility, decreased strength, and postural dysfunction.   ACTIVITY LIMITATIONS: stairs and locomotion level  PARTICIPATION LIMITATIONS: community activity and occupation  PERSONAL FACTORS: Behavior pattern, Past/current experiences, Time since onset of injury/illness/exacerbation, and 3+ comorbidities: RRMS, cognitive impairment,  ICD implanted 08/23/24 for CHF, HTN, Neuropathy, Type 2 Diaebtes, hx of CVA are also affecting patient's functional outcome.   REHAB POTENTIAL: Good  CLINICAL DECISION MAKING: Stable/uncomplicated  EVALUATION COMPLEXITY: Low  PLAN:  PT FREQUENCY: 2x/week  PT DURATION: 4 weeks  PLANNED INTERVENTIONS: 97164- PT Re-evaluation, 97110-Therapeutic exercises, 97530- Therapeutic activity, 97112- Neuromuscular re-education, 97535- Self Care, 02859- Manual therapy, [phone]- Gait training, Patient/Family education, Balance training, Stair training, and DME instructions  PLAN FOR NEXT SESSION: HEP should be added and updated as patient tolerates and progresses (I.e. sit to stand, standing hip abduction). Continue SLS (add foam or ball throws-he likes basketball), balance with EC and head turns, include steps ups and banded STS for improved hip extension.  Pt requesting to use bicycle end of session (for LE strengthening/aerobic endurance). *no lifting over 10 pounds until about mid Nov. 2025 (recent ICD implant)  Weighted toe taps to different levels, banded sit to stand, ball slam sit to stand  [employer], Student-PT 09/28/2024, 1:21 PM

## 2024-10-03 ENCOUNTER — Ambulatory Visit: Admitting: Physical Therapy

## 2024-10-03 VITALS — BP 127/81 | HR 73

## 2024-10-03 DIAGNOSIS — R2681 Unsteadiness on feet: Secondary | ICD-10-CM | POA: Diagnosis not present

## 2024-10-03 DIAGNOSIS — M6281 Muscle weakness (generalized): Secondary | ICD-10-CM | POA: Diagnosis not present

## 2024-10-03 DIAGNOSIS — R2689 Other abnormalities of gait and mobility: Secondary | ICD-10-CM

## 2024-10-03 NOTE — Therapy (Signed)
 OUTPATIENT PHYSICAL THERAPY NEURO TREATMENT   Patient Name: Thomas Nixon MRN: [id number] DOB:Oct 30, 1971, 53 y.o., male Today's Date: 10/03/2024   PCP: Ransom Other, MD  REFERRING PROVIDER: Gayland Lauraine PARAS, NP  END OF SESSION:  PT End of Session - 10/03/24 1227     Visit Number 6    Number of Visits 9    Date for Recertification  10/15/24    Authorization Type [employer]IC[employer] - needs auth    Authorization Time Period 09/15/24-12/14/24    Authorization - Number of Visits 9    PT Start Time 0801    PT Stop Time 0845    PT Time Calculation (min) 44 min    Equipment Utilized During Treatment Gait belt    Activity Tolerance Patient tolerated treatment well    Behavior During Therapy WFL for tasks assessed/performed              Past Medical History:  Diagnosis Date   CVA (cerebral infarction)    Diabetes mellitus without complication (HCC)    Type 2   Encounter for long-term (current) use of other medications    Headache(784.0)    Hypertension    MS (multiple sclerosis)    possible   Neuropathy    Other and unspecified hyperlipidemia    Stroke (HCC)    5 strokes   Thoracic or lumbosacral neuritis or radiculitis, unspecified    Unspecified late effects of cerebrovascular disease    Unspecified sleep apnea    does not use cpap   Past Surgical History:  Procedure Laterality Date   ANTERIOR LAT LUMBAR FUSION N/A 08/30/2014   Procedure: ANTERIOR LATERAL LUMBAR FUSION 1 LEVEL;  Surgeon: Oneil Rodgers Priestly, MD;  Location: [facility] OR;  Service: Orthopedics;  Laterality: N/A;  Lumbar 3-4 lateral interbody fusion with instrumentation and allograft   BRAIN BIOPSY     CARDIAC DEFIBRILLATOR PLACEMENT     Sept 2025   ICD IMPLANT N/A 08/23/2024   Procedure: ICD IMPLANT;  Surgeon: Waddell Danelle ORN, MD;  Location: [facility] INVASIVE CV LAB;  Service: Cardiovascular;  Laterality: N/A;   Patient Active Problem List   Diagnosis Date Noted   Paroxysmal atrial fibrillation (HCC)  04/30/2022   Vitamin D  deficiency 06/11/2020   Gait abnormality 10/04/2017   Radiculopathy 08/30/2014   Multiple sclerosis 08/10/2014   Diabetes mellitus without complication (HCC)    Cerebral infarction [facility])    Thoracic or lumbosacral neuritis or radiculitis, unspecified    Other and unspecified hyperlipidemia    Encounter for long-term (current) use of other medications    Late effects of cerebrovascular disease    Sleep apnea    Thrombotic cerebral infarction (HCC) 06/07/2013   Dysarthria 06/01/2013   Hypertension 06/01/2013    ONSET DATE: 09/12/2024  REFERRING DIAG: H64.D (ICD-10-CM) - Multiple sclerosis  THERAPY DIAG:  Unsteadiness on feet  Muscle weakness (generalized)  Other abnormalities of gait and mobility  Rationale for Evaluation and Treatment: Rehabilitation  SUBJECTIVE:  SUBJECTIVE STATEMENT:  Patient ambulates into clinic without AD. He states feeling good and has been stretching at home. Patient denies falls. Pt accompanied by: self; drove self  PERTINENT HISTORY: PMH: RRMS, cognitive impairment,  ICD implanted 08/23/24 for CHF, HTN, Neuropathy, Type 2 Diaebtes, hx of CVA  PAIN:  Are you having pain? No  PRECAUTIONS: ICD/Pacemaker and Other: no lifting over 10 pounds until about mid Nov. 2025 (recent ICD implant)  RED FLAGS: None   WEIGHT BEARING RESTRICTIONS: No  FALLS: Has patient fallen in last 6 months? No  LIVING ENVIRONMENT: Lives with: lives with their family and has 3 children  Lives in: House/apartment Stairs: Yes: Internal: 12 steps; on right going up and External: 1 steps; none Has following equipment at home: walking stick   PLOF: Independent Still driving, not working   PATIENT GOALS: Wants to get his R leg stronger   OBJECTIVE:  Note:  Objective measures were completed at Evaluation unless otherwise noted.  DIAGNOSTIC FINDINGS: MRI brain Feb 2025 IMPRESSION: MRI scan of the brain with and without contrast changes of periventricular, subcortical and periatrial T2/FLAIR white matter hyperintensities with differential discussed above.  This appears slightly progressed compared with previous MRI from 01/20/2022 no enhancing lesions are noted.   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     COGNITION: Overall cognitive status: Within functional limits for tasks assessed Cognitive impairment noted in pt's chart    SENSATION: Light touch: WFL  COORDINATION: Heel to shin: WNL    POSTURE: rounded shoulders and forward head   LOWER EXTREMITY MMT:    MMT Right Eval Left Eval  Hip flexion 4+ 5  Hip extension    Hip abduction 5 5  Hip adduction 5 5  Hip internal rotation    Hip external rotation    Knee flexion 5 5  Knee extension 4+ 5  Ankle dorsiflexion 5 5  Ankle plantarflexion    Ankle inversion    Ankle eversion    (Blank rows = not tested)    All tested in sitting   TRANSFERS: Sit to stand: Modified independence  Assistive device utilized: None     Stand to sit: Modified independence  Assistive device utilized: None      With LLE staggered slightly behind   GAIT: Findings: Gait Characteristics: step through pattern, decreased arm swing- Right, decreased stance time- Right, decreased stride length, decreased hip/knee flexion- Right, trunk flexed, and wide BOS, Distance walked: clinic distances , Assistive device utilized:None, Level of assistance: Modified independence, and Comments: decr hip extension noted   FUNCTIONAL TESTS:  5 times sit to stand: 14.8 seconds with no UE support, pt slower with 4th rep 10 meter walk test: 12.85 seconds = 2.55 ft/sec   SLS: RLE: 5 seconds, LLE: 11.6 seconds    M-CTSIB  Condition 1: Firm Surface, EO 30  Sec, Normal Sway  Condition 2: Firm Surface, EC 30 Sec, Normal Sway  Condition 3: Foam Surface, EO 30 Sec, Normal Sway  Condition 4: Foam Surface, EC 20 Sec  TREATMENT DATE: 10/03/24  Therapeutic Activity, LE strengthening, functional strength, cueing for DF, hip flexion: BP and HR taken in sitting at rest at beginning of (see below for details).  Vitals:   10/03/24 0805  BP: 127/81  Pulse: 73   HEP review and need to continue exercise at home outside of the clinic Reviewing gait and foot clearance to decrease risk of falls Sit to stand on foam w/6# ballslam, 2 sets x 10 reps, challenging due to needing to reach far and use hip strategy to recovery balance, min assist needed, improved catching with bounce with target on floor  NMR, reaction time, dual tasking with distracting colors, remembering direction to step during blazepod task: Blazepods foot hits, 6 pods with 5 distracting colors, blue as focus color, 2 sets x 4 minutes each Set 1: forward stepping to blue pod for 2 minutes, then lateral stepping for 2 mins, 30 hits Set 2: forward and backward walking with lateral stepping hits, 24 hits Cueing for speed, quick reaction, DF and hip flexion to hit pod, keeping feet in line w/lateral stepping Standing rest between sets due to fatigue Step taps at stairs on foam x 20 total taps 4 circle pads on steps at different heights and calling out random color to tap with foot Memory tasks with multiple colors called out and tap accordingly *CGA used for all treatment with minimal correction needed - continues to improve each session  PATIENT EDUCATION: Education details: See above Person educated: Patient Education method: Explanation Education comprehension: verbalized understanding  HOME EXERCISE PROGRAM: Access Code: [email or web address] URL: [email or web address] Date: 09/19/2024 Prepared by:  She[place] Senate  Exercises: - Supine Bridge  - 1 x daily - 3 x weekly - 2 sets - 10 reps  - Standing Single Leg Stance with Counter Support  - 1 x daily - 3 x weekly - 3 sets  - 20 hold - Single Leg Heel Raise with Counter Support  - 1 x daily - 3 x weekly - 2 sets - 10 reps   *Can add to HEP accordingly  GOALS: Goals reviewed with patient? Yes  SHORT TERM GOALS: ALL STGS = LTGS   LONG TERM GOALS: Target date: 10/13/2024  Pt will be independent with final HEP for strength, gait, balance in order to build upon functional gains made in therapy. Baseline:  Goal status: INITIAL  2.  Pt will improve FGA to at least a 24/30 in order to demo decr fall risk. Baseline: 19/30 Goal status: INITIAL  3.  Pt will improve condition 4 of mCTSIB to at least 30 seconds in order to demo improved vestibular input for balance.  Baseline: 20 seconds  Goal status: INITIAL  4.  Pt will improve gait speed with no AD to at least 3.0 ft/sec in order to demo improved community mobility.  Baseline: 12.85 seconds = 2.55 ft/sec  Goal status: INITIAL  5.  Pt will improve 5x sit<>stand to less than or equal to 12 sec to demonstrate improved functional strength and transfer efficiency.   Baseline: 14.8 seconds with no UE support Goal status: INITIAL   ASSESSMENT:  CLINICAL IMPRESSION: Patient was seen today for skilled physical therapy with continued advancement of LE strengthening and cognitive dual tasking during blazepod and toe tap to steps tasks. Patient continues to be challenged by direction changing during blazepod exercises and patient often needs redirection to successfully complete the task. These should be continued to reduce risk of falls with direction changes and dual tasks that involve  cognitive components. Patient tolerated session well with minimal increase in fatigue and rest breaks were used as needed. Patient continues to make improvements with each round of tasks throughout session.  Throughout session patient needed continued cueing on L toe clearance. CGA used throughout with little to no correction needed which continues to improve since last session. Patient continues to benefit from skilled PT to address impairments as noted and progress towards long term goals.   OBJECTIVE IMPAIRMENTS: Abnormal gait, decreased activity tolerance, decreased balance, decreased cognition, decreased mobility, decreased strength, and postural dysfunction.   ACTIVITY LIMITATIONS: stairs and locomotion level  PARTICIPATION LIMITATIONS: community activity and occupation  PERSONAL FACTORS: Behavior pattern, Past/current experiences, Time since onset of injury/illness/exacerbation, and 3+ comorbidities: RRMS, cognitive impairment,  ICD implanted 08/23/24 for CHF, HTN, Neuropathy, Type 2 Diaebtes, hx of CVA are also affecting patient's functional outcome.   REHAB POTENTIAL: Good  CLINICAL DECISION MAKING: Stable/uncomplicated  EVALUATION COMPLEXITY: Low  PLAN:  PT FREQUENCY: 2x/week  PT DURATION: 4 weeks  PLANNED INTERVENTIONS: 97164- PT Re-evaluation, 97110-Therapeutic exercises, 97530- Therapeutic activity, 97112- Neuromuscular re-education, 97535- Self Care, 02859- Manual therapy, [phone]- Gait training, Patient/Family education, Balance training, Stair training, and DME instructions  PLAN FOR NEXT SESSION: HEP should be added and updated as patient tolerates and progresses (I.e. sit to stand, standing hip abduction). Continue SLS (add foam or ball throws-he likes basketball), balance with EC and head turns, include steps ups and banded STS for improved hip extension.  Pt requesting to use bicycle end of session (for LE strengthening/aerobic endurance). *no lifting over 10 pounds until about mid Nov. 2025 (recent ICD implant)  Weighted toe taps to different levels, banded sit to stand, ball slam sit to stand  [employer], Student-PT 10/03/2024, 12:31 PM

## 2024-10-04 ENCOUNTER — Ambulatory Visit (INDEPENDENT_AMBULATORY_CARE_PROVIDER_SITE_OTHER)

## 2024-10-04 DIAGNOSIS — I480 Paroxysmal atrial fibrillation: Secondary | ICD-10-CM

## 2024-10-05 ENCOUNTER — Encounter: Payer: Self-pay | Admitting: Physical Therapy

## 2024-10-05 ENCOUNTER — Telehealth: Payer: Self-pay | Admitting: Neurology

## 2024-10-05 ENCOUNTER — Ambulatory Visit: Admitting: Physical Therapy

## 2024-10-05 VITALS — BP 131/87 | HR 67

## 2024-10-05 DIAGNOSIS — R2689 Other abnormalities of gait and mobility: Secondary | ICD-10-CM | POA: Diagnosis not present

## 2024-10-05 DIAGNOSIS — R2681 Unsteadiness on feet: Secondary | ICD-10-CM | POA: Diagnosis not present

## 2024-10-05 DIAGNOSIS — M6281 Muscle weakness (generalized): Secondary | ICD-10-CM

## 2024-10-05 LAB — CUP PACEART REMOTE DEVICE CHECK
Battery Remaining Longevity: 180 mo
Battery Remaining Percentage: 100 %
Brady Statistic RV Percent Paced: 0 %
Date Time Interrogation Session: 20251112001500
HighPow Impedance: 57 Ohm
Implantable Lead Connection Status: 753985
Implantable Lead Implant Date: 20251001
Implantable Lead Location: 753860
Implantable Lead Model: 137
Implantable Lead Serial Number: 302005
Implantable Pulse Generator Implant Date: 20251001
Lead Channel Impedance Value: 561 Ohm
Lead Channel Pacing Threshold Amplitude: 0.7 V
Lead Channel Pacing Threshold Pulse Width: 0.4 ms
Lead Channel Setting Pacing Amplitude: 3.5 V
Lead Channel Setting Pacing Pulse Width: 0.4 ms
Lead Channel Setting Sensing Sensitivity: 0.5 mV
Pulse Gen Serial Number: 225078
Zone Setting Status: 755011

## 2024-10-05 NOTE — Telephone Encounter (Signed)
 Spoke with [id number] as this Rx was last sent there in July. I was told the Rx was automatically [facility] due to [facility] not being [facility]. I called [facility] and spoke with Johnny. I was told they have attempted to contact 2x because they need additional information from him. The last Rx was sent to him on 08/30/24.  Spoke with patient and let him know Vumer[facility]  has to continue being filled at [facility]. He said he has about 2 days left. He will call them right away and let them know to expedite refill. I gave him the pharmacy phone #. He thanked me for the call and will let us  know if he has any trouble filling it.

## 2024-10-05 NOTE — Therapy (Signed)
 OUTPATIENT PHYSICAL THERAPY NEURO TREATMENT   Patient Name: Thomas Nixon MRN: [id number] DOB:09/03/1971, 53 y.o., male Today's Date: 10/05/2024   PCP: Ransom Other, MD  REFERRING PROVIDER: Gayland Lauraine PARAS, NP  END OF SESSION:  PT End of Session - 10/05/24 0801     Visit Number 7    Number of Visits 9    Date for Recertification  10/15/24    Authorization Type [employer] - needs auth    Authorization Time Period 09/15/24-12/14/24    Authorization - Visit Number 7    Authorization - Number of Visits 9    PT Start Time 0804   started late d/t pt needing to use restroom beginning of session   PT Stop Time 0844    PT Time Calculation (min) 40 min    Equipment Utilized During Treatment Gait belt    Activity Tolerance Patient tolerated treatment well    Behavior During Therapy WFL for tasks assessed/performed              Past Medical History:  Diagnosis Date   CVA (cerebral infarction)    Diabetes mellitus without complication (HCC)    Type 2   Encounter for long-term (current) use of other medications    Headache(784.0)    Hypertension    MS (multiple sclerosis)    possible   Neuropathy    Other and unspecified hyperlipidemia    Stroke (HCC)    5 strokes   Thoracic or lumbosacral neuritis or radiculitis, unspecified    Unspecified late effects of cerebrovascular disease    Unspecified sleep apnea    does not use cpap   Past Surgical History:  Procedure Laterality Date   ANTERIOR LAT LUMBAR FUSION N/A 08/30/2014   Procedure: ANTERIOR LATERAL LUMBAR FUSION 1 LEVEL;  Surgeon: Oneil Rodgers Priestly, MD;  Location: [facility] OR;  Service: Orthopedics;  Laterality: N/A;  Lumbar 3-4 lateral interbody fusion with instrumentation and allograft   BRAIN BIOPSY     CARDIAC DEFIBRILLATOR PLACEMENT     Sept 2025   ICD IMPLANT N/A 08/23/2024   Procedure: ICD IMPLANT;  Surgeon: Waddell Danelle ORN, MD;  Location: [facility] INVASIVE CV LAB;  Service: Cardiovascular;  Laterality: N/A;    Patient Active Problem List   Diagnosis Date Noted   Paroxysmal atrial fibrillation (HCC) 04/30/2022   Vitamin D  deficiency 06/11/2020   Gait abnormality 10/04/2017   Radiculopathy 08/30/2014   Multiple sclerosis 08/10/2014   Diabetes mellitus without complication (HCC)    Cerebral infarction [facility])    Thoracic or lumbosacral neuritis or radiculitis, unspecified    Other and unspecified hyperlipidemia    Encounter for long-term (current) use of other medications    Late effects of cerebrovascular disease    Sleep apnea    Thrombotic cerebral infarction (HCC) 06/07/2013   Dysarthria 06/01/2013   Hypertension 06/01/2013    ONSET DATE: 09/12/2024  REFERRING DIAG: H64.D (ICD-10-CM) - Multiple sclerosis  THERAPY DIAG:  Unsteadiness on feet  Muscle weakness (generalized)  Other abnormalities of gait and mobility  Rationale for Evaluation and Treatment: Rehabilitation  SUBJECTIVE:  SUBJECTIVE STATEMENT: Pt ambulatory into clinic without AD use.  Pt reports no falls since last therapy visit and no pain.  Doing stretching at home a little bit.  No acute changes reported.  Pt reports feeling good. Pt accompanied by: self; drove self  PERTINENT HISTORY: PMH: RRMS, cognitive impairment,  ICD implanted 08/23/24 for CHF, HTN, Neuropathy, Type 2 Diaebtes, hx of CVA  PAIN:  Are you having pain? No  PRECAUTIONS: ICD/Pacemaker and Other: no lifting over 10 pounds until about mid Nov. 2025 (recent ICD implant)  RED FLAGS: None   WEIGHT BEARING RESTRICTIONS: No  FALLS: Has patient fallen in last 6 months? No  LIVING ENVIRONMENT: Lives with: lives with their family and has 3 children  Lives in: House/apartment Stairs: Yes: Internal: 12 steps; on right going up and External: 1 steps; none Has  following equipment at home: walking stick   PLOF: Independent Still driving, not working   PATIENT GOALS: Wants to get his R leg stronger   OBJECTIVE:  Note: Objective measures were completed at Evaluation unless otherwise noted.  DIAGNOSTIC FINDINGS: MRI brain Feb 2025 IMPRESSION: MRI scan of the brain with and without contrast changes of periventricular, subcortical and periatrial T2/FLAIR white matter hyperintensities with differential discussed above.  This appears slightly progressed compared with previous MRI from 01/20/2022 no enhancing lesions are noted.   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     COGNITION: Overall cognitive status: Within functional limits for tasks assessed Cognitive impairment noted in pt's chart    SENSATION: Light touch: WFL  COORDINATION: Heel to shin: WNL    POSTURE: rounded shoulders and forward head   LOWER EXTREMITY MMT:    MMT Right Eval Left Eval  Hip flexion 4+ 5  Hip extension    Hip abduction 5 5  Hip adduction 5 5  Hip internal rotation    Hip external rotation    Knee flexion 5 5  Knee extension 4+ 5  Ankle dorsiflexion 5 5  Ankle plantarflexion    Ankle inversion    Ankle eversion    (Blank rows = not tested)    All tested in sitting   TRANSFERS: Sit to stand: Modified independence  Assistive device utilized: None     Stand to sit: Modified independence  Assistive device utilized: None      With LLE staggered slightly behind   GAIT: Findings: Gait Characteristics: step through pattern, decreased arm swing- Right, decreased stance time- Right, decreased stride length, decreased hip/knee flexion- Right, trunk flexed, and wide BOS, Distance walked: clinic distances , Assistive device utilized:None, Level of assistance: Modified independence, and Comments: decr hip extension noted   FUNCTIONAL TESTS:  5 times sit to stand: 14.8 seconds with no UE  support, pt slower with 4th rep 10 meter walk test: 12.85 seconds = 2.55 ft/sec   SLS: RLE: 5 seconds, LLE: 11.6 seconds    M-CTSIB  Condition 1: Firm Surface, EO 30 Sec, Normal Sway  Condition 2: Firm Surface, EC 30 Sec, Normal Sway  Condition 3: Foam Surface, EO 30 Sec, Normal Sway  Condition 4: Foam Surface, EC 20 Sec  TREATMENT DATE: 10/05/24  Self Care: BP and HR taken in sitting at rest beginning of session (see below for details). Vitals:   10/05/24 0808  BP: 131/87  Pulse: 67  HR 68 bpm end of session at rest.  Pt reporting end of session 4/10 RPE. Education on pacing.  Therapeutic Activity: Ambulation: x230 feet no AD use with 3# B ankle weights; 4/10 RPE post activity Toe taps (standing on Airex in // bars) to 6 inch step: 10 reps x3 sets R LE; 10 reps x3 sets L LE; no UE support; CGA; RPE 2-3/10 post activity Step ups (6 inch step; keeping foot on step); no UE support: initially trialed with 3# B ankle weights x2 reps with R LE but d/t difficulty took 3# ankle weights off for rest of activity; x10 reps x2 sets R LE; x10 reps x2 sets L LE; CGA; 5/10 RPE post activity Toe taps to 6 or 12 inch step (at stairs): no UE support; pt tapping on color circle (2 colors on 6 inch step and 2 colors on 12 inch step; therapist calling out random color for dual tasking); x1 minute x3 sets; RPE 2/10 post activity  PATIENT EDUCATION: Education details: Pacing. Person educated: Patient Education method: Explanation Education comprehension: verbalized understanding  HOME EXERCISE PROGRAM: Access Code: [email or web address] URL: [email or web address] Date: 09/19/2024 Prepared by: Sheffield Senate  Exercises: - Supine Bridge  - 1 x daily - 3 x weekly - 2 sets - 10 reps  - Standing Single Leg Stance with Counter Support  - 1 x daily - 3 x weekly - 3 sets  - 20 hold - Single Leg Heel Raise with Counter  Support  - 1 x daily - 3 x weekly - 2 sets - 10 reps   *Can add to HEP accordingly  GOALS: Goals reviewed with patient? Yes  SHORT TERM GOALS: ALL STGS = LTGS   LONG TERM GOALS: Target date: 10/13/2024  Pt will be independent with final HEP for strength, gait, balance in order to build upon functional gains made in therapy. Baseline:  Goal status: INITIAL  2.  Pt will improve FGA to at least a 24/30 in order to demo decr fall risk. Baseline: 19/30 Goal status: INITIAL  3.  Pt will improve condition 4 of mCTSIB to at least 30 seconds in order to demo improved vestibular input for balance.  Baseline: 20 seconds  Goal status: INITIAL  4.  Pt will improve gait speed with no AD to at least 3.0 ft/sec in order to demo improved community mobility.  Baseline: 12.85 seconds = 2.55 ft/sec  Goal status: INITIAL  5.  Pt will improve 5x sit<>stand to less than or equal to 12 sec to demonstrate improved functional strength and transfer efficiency.   Baseline: 14.8 seconds with no UE support Goal status: INITIAL   ASSESSMENT:  CLINICAL IMPRESSION: Patient was seen today for physical therapy treatment to address LE strengthening, balance, ambulation, and dual tasking.  Focused session on improving LE foot clearance and step height with ambulation, balance on compliant surface, single leg balance/stability, and cognitive dual tasking with toe taps task.  Pt provided sitting and standing rest breaks during session in order to pace pt; pt appeared to tolerate session well with minimal increase in fatigue end of session.  Patient continues to be limited by L toe clearance and dual tasking.  They demonstrate improvement in technique with tasks with cueing and repetition.  CGA provided during session for safety but pt able  to self correct balance as needed.  They would continue to benefit from skilled PT to address impairments as noted and progress towards long term goals.  OBJECTIVE IMPAIRMENTS:  Abnormal gait, decreased activity tolerance, decreased balance, decreased cognition, decreased mobility, decreased strength, and postural dysfunction.   ACTIVITY LIMITATIONS: stairs and locomotion level  PARTICIPATION LIMITATIONS: community activity and occupation  PERSONAL FACTORS: Behavior pattern, Past/current experiences, Time since onset of injury/illness/exacerbation, and 3+ comorbidities: RRMS, cognitive impairment,  ICD implanted 08/23/24 for CHF, HTN, Neuropathy, Type 2 Diaebtes, hx of CVA are also affecting patient's functional outcome.   REHAB POTENTIAL: Good  CLINICAL DECISION MAKING: Stable/uncomplicated  EVALUATION COMPLEXITY: Low  PLAN:  PT FREQUENCY: 2x/week  PT DURATION: 4 weeks  PLANNED INTERVENTIONS: 97164- PT Re-evaluation, 97110-Therapeutic exercises, 97530- Therapeutic activity, 97112- Neuromuscular re-education, 97535- Self Care, [id number]- Manual therapy, [phone]- Gait training, Patient/Family education, Balance training, Stair training, and DME instructions  PLAN FOR NEXT SESSION: HEP should be added and updated as patient tolerates and progresses (I.e. sit to stand, standing hip abduction). Continue SLS (add foam or ball throws-he likes basketball), balance with EC and head turns, include steps ups and banded STS for improved hip extension.  Pt requesting to use bicycle end of session (for LE strengthening/aerobic endurance). *no lifting over 10 pounds until about mid Nov. 2025 (recent ICD implant)  Weighted toe taps to different levels, banded sit to stand, ball slam sit to stand  [employer], PT 10/05/2024, 9:24 AM

## 2024-10-05 NOTE — Telephone Encounter (Signed)
 From last visit 09/12/24: ASSESSMENT AND PLAN 1.  Relapsing remitting multiple sclerosis 2.  Cognitive impairment 3.  Gait abnormality             Feels overall stable on Vumerity , will continue              Check routine labs today, Vitamin D              MRI brain stable in Feb 2025 with slight progression since 2023             Now has ICD              Referral to neuro rehab for PT             Follow up in 6 months

## 2024-10-05 NOTE — Telephone Encounter (Signed)
 Patient request refill for VUMERITY  231 MG CPDR send to  [facility] [phone]

## 2024-10-07 NOTE — Progress Notes (Signed)
   ADVANCED HEART FAILURE NEW PATIENT CLINIC NOTE  Referring Physician: Ransom Other, MD  Primary Care: Ransom Other, MD Primary Cardiologist:  HPI: Thomas Nixon is a 53 y.o. male with a PMH of HTN, afib, diabetes, systolic cardiomyopathy who presents for initial visit for further evaluation and treatment of heart failure/cardiomyopathy.      He has a history of atrial fibrillation and was previously placed on Xarelto  20 mg daily. Initially planned for cardioversion, he self-converted. His ejection fraction has varied over the years, ranging from 45% to 55%, but an echocardiogram in 2025 showed a decrease to 30-35%. Started on GDMT.  CCTA 02/2024 showed CAC of 16 with minimal non obstructive disease.   EF did not improve despite medical therapy. ICD implanted, referred to advanced heart failure clinic.      SUBJECTIVE:  Patient and wife at visit today, unfortunately thought that he was going to an ICD follow up appointment. We discussed the role of HF clinic, plan for medication titration. He noted that he has a mild family history of HF but has no biological sibilings. He cannot remember a clear trigger event for his HF, just prior atrial fibrillation some time ago.  Denies any chest pain, carpal tunnel, neuropathy. Has a history of well controlled MS, no recent flares.   PMH, current medications, allergies, social history, and family history reviewed in epic.  PHYSICAL EXAM: Vitals:   10/09/24 0941  BP: 130/80  Pulse: 71  SpO2: 99%   GENERAL: Well nourished and in no apparent distress at rest.  PULM:  Normal work of breathing, clear to auscultation bilaterally. Respirations are unlabored.  CARDIAC:  JVP: flat         Normal rate with regular rhythm. No murmurs, rubs or gallops.  Trace edema. Warm and well perfused extremities. ABDOMEN: Soft, non-tender, non-distended. NEUROLOGIC: Patient is oriented x3 with no focal or lateralizing neurologic deficits.    DATA  REVIEW  ECG: 08/23/2024: Sinus bradycardia with sinus arrhythmia    ECHO: 12/06/2020: LVEF 45-50%, moderately dilated, mild LVH 02/2024: LVEF 30-35%, moderately dilated, grade I DD, strain not concerning for amyloid 05/31/2024: LVEF 30-35%, RV mildly reduced  CATH: None  CCTA: CAC score 16, minimal LAD plaque   ASSESSMENT & PLAN:  Chronic systolic heart failure: NICM, long standing with fluctuating EF. No clear trigger identified, no concerning substance use, family history not overly concerning. Will ICD already in place, would eventually benefit from CMR to evaluate for LGE and etiology. - Well compensated, NYHA class II - Stop amlodipine  - Increase entresto  to 49/51mg  BID - Start metoprolol  succinate 25mg  daily - Continue spironolactone  25mg  daily, jardiance 10mg  daily - Maximize GDMT and eventual CMR when further out from ICD  CAD: Mild CAD noted on CCTA. - Continue atorvastatin  40mg  daily, tolerating well  Atrial fibrillation:  - Continues on xarelto , in NSR today  MS: No recent flares. - Follow up with neurology, on vumerity   T2DM:   - Well controlled, jardiance as above, continue metformin  1000mg  BID  Follow up in 3 months  Morene Brownie, MD Advanced Heart Failure Mechanical Circulatory Support 10/09/24

## 2024-10-08 ENCOUNTER — Ambulatory Visit: Payer: Self-pay | Admitting: Internal Medicine

## 2024-10-09 ENCOUNTER — Encounter (HOSPITAL_COMMUNITY): Payer: Self-pay | Admitting: Cardiology

## 2024-10-09 ENCOUNTER — Ambulatory Visit (HOSPITAL_COMMUNITY)
Admission: RE | Admit: 2024-10-09 | Discharge: 2024-10-09 | Disposition: A | Discharge status: Home / Self Care | Source: Ambulatory Visit | Attending: Cardiology | Admitting: Cardiology

## 2024-10-09 VITALS — BP 130/80 | HR 71 | Wt 213.8 lb

## 2024-10-09 DIAGNOSIS — I4891 Unspecified atrial fibrillation: Secondary | ICD-10-CM | POA: Diagnosis not present

## 2024-10-09 DIAGNOSIS — I5022 Chronic systolic (congestive) heart failure: Secondary | ICD-10-CM | POA: Insufficient documentation

## 2024-10-09 DIAGNOSIS — I110 Hypertensive heart disease with heart failure: Secondary | ICD-10-CM | POA: Insufficient documentation

## 2024-10-09 DIAGNOSIS — I480 Paroxysmal atrial fibrillation: Secondary | ICD-10-CM | POA: Diagnosis not present

## 2024-10-09 DIAGNOSIS — I10 Essential (primary) hypertension: Secondary | ICD-10-CM

## 2024-10-09 DIAGNOSIS — Z7984 Long term (current) use of oral hypoglycemic drugs: Secondary | ICD-10-CM | POA: Diagnosis not present

## 2024-10-09 DIAGNOSIS — Z7901 Long term (current) use of anticoagulants: Secondary | ICD-10-CM | POA: Insufficient documentation

## 2024-10-09 DIAGNOSIS — I428 Other cardiomyopathies: Secondary | ICD-10-CM | POA: Insufficient documentation

## 2024-10-09 DIAGNOSIS — E119 Type 2 diabetes mellitus without complications: Secondary | ICD-10-CM | POA: Insufficient documentation

## 2024-10-09 DIAGNOSIS — G35D Multiple sclerosis, unspecified: Secondary | ICD-10-CM | POA: Diagnosis not present

## 2024-10-09 DIAGNOSIS — Z79899 Other long term (current) drug therapy: Secondary | ICD-10-CM | POA: Diagnosis not present

## 2024-10-09 DIAGNOSIS — I2510 Atherosclerotic heart disease of native coronary artery without angina pectoris: Secondary | ICD-10-CM | POA: Insufficient documentation

## 2024-10-09 MED ORDER — METOPROLOL SUCCINATE ER 25 MG PO TB24: 25.0000 mg | ORAL_TABLET | Freq: Every day | ORAL | 3 refills | Status: AC

## 2024-10-09 MED ORDER — SACUBITRIL-VALSARTAN 49-51 MG PO TABS
1.0000 | ORAL_TABLET | Freq: Two times a day (BID) | ORAL | 11 refills | Status: AC
Start: 2024-10-09 — End: ?

## 2024-10-09 NOTE — Progress Notes (Signed)
 Remote ICD Transmission

## 2024-10-09 NOTE — Patient Instructions (Signed)
 STOP Amlodipine   CHANGE Entretsto to 49/51 mg Twice daily  START Toprol  XL 25 mg daily.  Your physician recommends that you schedule a follow-up appointment in: 3 months ( February 2026) **PLEASE CALL THE OFFICE IN [place] TO ARRANGE YOUR FOLLOW UP APPOINTMENT.**  If you have any questions or concerns before your next appointment please send us  a message through [place] or call our office at [phone].    TO LEAVE A MESSAGE FOR THE NURSE SELECT OPTION 2, PLEASE LEAVE A MESSAGE INCLUDING: YOUR NAME DATE OF BIRTH CALL BACK NUMBER REASON FOR CALL**this is important as we prioritize the call backs  YOU WILL RECEIVE A CALL BACK THE SAME DAY AS LONG AS YOU CALL BEFORE 4:00 PM  At the [facility], you and your health needs are our priority. As part of our continuing mission to provide you with exceptional heart care, we have created designated Provider Care Teams. These Care Teams include your primary Cardiologist (physician) and Advanced Practice Providers (APPs- Physician Assistants and Nurse Practitioners) who all work together to provide you with the care you need, when you need it.   You may see any of the following providers on your designated Care Team at your next follow up: Dr Toribio Fuel Dr Ezra Shuck Dr. Morene Brownie Greig Mosses, NP Caffie Shed, [place] [facility] [place], [place] Beckey Coe, NP Jordan Lee, NP Ellouise Class, NP Tinnie Redman, PharmD Jaun Bash, PharmD   Please be sure to bring in all your medications bottles to every appointment.    Thank you for choosing [facility]

## 2024-10-10 ENCOUNTER — Ambulatory Visit: Admitting: Physical Therapy

## 2024-10-10 VITALS — BP 133/67 | HR 53

## 2024-10-10 DIAGNOSIS — R2681 Unsteadiness on feet: Secondary | ICD-10-CM

## 2024-10-10 DIAGNOSIS — M6281 Muscle weakness (generalized): Secondary | ICD-10-CM | POA: Diagnosis not present

## 2024-10-10 DIAGNOSIS — R2689 Other abnormalities of gait and mobility: Secondary | ICD-10-CM

## 2024-10-10 NOTE — Therapy (Signed)
 OUTPATIENT PHYSICAL THERAPY NEURO TREATMENT   Patient Name: Thomas Nixon MRN: [id number] DOB:[birth year], 53 y.o., male Today's Date: 10/10/2024   PCP: Ransom Other, MD  REFERRING PROVIDER: Gayland Lauraine PARAS, NP  END OF SESSION:  PT End of Session - 10/10/24 1122     Visit Number 8    Number of Visits 9    Date for Recertification  10/15/24    Authorization Type [employer]IC[employer] - needs auth    Authorization Time Period 09/15/24-12/14/24    Authorization - Number of Visits 9    PT Start Time 0802    PT Stop Time 0844    PT Time Calculation (min) 42 min    Equipment Utilized During Treatment Gait belt    Activity Tolerance Patient tolerated treatment well    Behavior During Therapy WFL for tasks assessed/performed               Past Medical History:  Diagnosis Date   CVA (cerebral infarction)    Diabetes mellitus without complication (HCC)    Type 2   Encounter for long-term (current) use of other medications    Headache(784.0)    Hypertension    MS (multiple sclerosis)    possible   Neuropathy    Other and unspecified hyperlipidemia    Stroke (HCC)    5 strokes   Thoracic or lumbosacral neuritis or radiculitis, unspecified    Unspecified late effects of cerebrovascular disease    Unspecified sleep apnea    does not use cpap   Past Surgical History:  Procedure Laterality Date   ANTERIOR LAT LUMBAR FUSION N/A 08/30/2014   Procedure: ANTERIOR LATERAL LUMBAR FUSION 1 LEVEL;  Surgeon: Oneil Rodgers Priestly, MD;  Location: [facility] OR;  Service: Orthopedics;  Laterality: N/A;  Lumbar 3-4 lateral interbody fusion with instrumentation and allograft   BRAIN BIOPSY     CARDIAC DEFIBRILLATOR PLACEMENT     Sept 2025   ICD IMPLANT N/A 08/23/2024   Procedure: ICD IMPLANT;  Surgeon: Waddell Danelle ORN, MD;  Location: [facility] INVASIVE CV LAB;  Service: Cardiovascular;  Laterality: N/A;   Patient Active Problem List   Diagnosis Date Noted   Paroxysmal atrial fibrillation (HCC)  04/30/2022   Vitamin D  deficiency 06/11/2020   Gait abnormality 10/04/2017   Radiculopathy 08/30/2014   Multiple sclerosis 08/10/2014   Diabetes mellitus without complication (HCC)    Cerebral infarction [facility])    Thoracic or lumbosacral neuritis or radiculitis, unspecified    Other and unspecified hyperlipidemia    Encounter for long-term (current) use of other medications    Late effects of cerebrovascular disease    Sleep apnea    Thrombotic cerebral infarction (HCC) 06/07/2013   Dysarthria 06/01/2013   Hypertension 06/01/2013    ONSET DATE: 09/12/2024  REFERRING DIAG: H64.D (ICD-10-CM) - Multiple sclerosis  THERAPY DIAG:  Unsteadiness on feet  Muscle weakness (generalized)  Other abnormalities of gait and mobility  Rationale for Evaluation and Treatment: Rehabilitation  SUBJECTIVE:  SUBJECTIVE STATEMENT: Patient ambulates into clinic without AD. Patient stated they got another BP medication yesterday and started taking it today. Pt states they feel good overall and doesn't have any or much fatigue. Patient denies falls.  Pt accompanied by: self; drove self  PERTINENT HISTORY: PMH: RRMS, cognitive impairment,  ICD implanted 08/23/24 for CHF, HTN, Neuropathy, Type 2 Diaebtes, hx of CVA  PAIN:  Are you having pain? No  PRECAUTIONS: ICD/Pacemaker and Other: no lifting over 10 pounds until about mid Nov. 2025 (recent ICD implant)  RED FLAGS: None   WEIGHT BEARING RESTRICTIONS: No  FALLS: Has patient fallen in last 6 months? No  LIVING ENVIRONMENT: Lives with: lives with their family and has 3 children  Lives in: House/apartment Stairs: Yes: Internal: 12 steps; on right going up and External: 1 steps; none Has following equipment at home: walking stick   PLOF: Independent Still  driving, not working   PATIENT GOALS: Wants to get his R leg stronger   OBJECTIVE:  Note: Objective measures were completed at Evaluation unless otherwise noted.  DIAGNOSTIC FINDINGS: MRI brain Feb 2025 IMPRESSION: MRI scan of the brain with and without contrast changes of periventricular, subcortical and periatrial T2/FLAIR white matter hyperintensities with differential discussed above.  This appears slightly progressed compared with previous MRI from 01/20/2022 no enhancing lesions are noted.   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     COGNITION: Overall cognitive status: Within functional limits for tasks assessed Cognitive impairment noted in pt's chart    SENSATION: Light touch: WFL  COORDINATION: Heel to shin: WNL    POSTURE: rounded shoulders and forward head   LOWER EXTREMITY MMT:    MMT Right Eval Left Eval  Hip flexion 4+ 5  Hip extension    Hip abduction 5 5  Hip adduction 5 5  Hip internal rotation    Hip external rotation    Knee flexion 5 5  Knee extension 4+ 5  Ankle dorsiflexion 5 5  Ankle plantarflexion    Ankle inversion    Ankle eversion    (Blank rows = not tested)    All tested in sitting   TRANSFERS: Sit to stand: Modified independence  Assistive device utilized: None     Stand to sit: Modified independence  Assistive device utilized: None      With LLE staggered slightly behind   GAIT: Findings: Gait Characteristics: step through pattern, decreased arm swing- Right, decreased stance time- Right, decreased stride length, decreased hip/knee flexion- Right, trunk flexed, and wide BOS, Distance walked: clinic distances , Assistive device utilized:None, Level of assistance: Modified independence, and Comments: decr hip extension noted   FUNCTIONAL TESTS:  5 times sit to stand: 14.8 seconds with no UE support, pt slower with 4th rep 10 meter walk test: 12.85 seconds = 2.55  ft/sec   SLS: RLE: 5 seconds, LLE: 11.6 seconds    M-CTSIB  Condition 1: Firm Surface, EO 30 Sec, Normal Sway  Condition 2: Firm Surface, EC 30 Sec, Normal Sway  Condition 3: Foam Surface, EO 30 Sec, Normal Sway  Condition 4: Foam Surface, EC 20 Sec  TREATMENT DATE: 10/10/24  Therapeutic Activity: BP and HR taken in sitting at rest beginning of session (see below for details). Vitals:   10/10/24 0807  BP: 133/67  Pulse: (!) 53   Ambulation x 115 feet, no AD, CGA Ambulation with 4 pound ankle wt and banded resistance/perturbations x 115 feet, no AD, CGA Ankle wts, banded resistance with perturbations well tolerated with recovery during perturbations and minimal to no assist needed  NMR: Toe taps to 6 or 12 inch step (at stairs) standing on foam, no UE support, CGA, min assist needed due to some loss of balance pt tapping on color circle 2 colors on 6 inch step and 2 colors on 12 inch step therapist calling out random color for dual tasking Therapist then calling out 2 colors for dual tasking Therapist then calling out color and pt has to tap opposing color only, challenging but well tolerated due to increased difficulty of task On air ex: Steps with sign - directions written with different colors, colors called out and directions performed Pt very challenged by cognitive dual tasking, patient a little frustrated with not being able to determine what movement is next, he wants to practice this next session On blue foam: Alternating stepping over small obstacle in lateral direction to work on foot clearance, SLS stability, and weight shift, performed 10 reps each side, CGA, minimal assist needed periodically due to some LOB, unable to clear foam or hurdle   PATIENT EDUCATION: Education details: Continue to think about lifting knees and toes during walking, especially the left foot and left toes so  not to trip and stub the toe. Person educated: Patient Education method: Explanation Education comprehension: verbalized understanding  HOME EXERCISE PROGRAM: Access Code: [email or web address] URL: [email or web address] Date: 09/19/2024 Prepared by: Sheffield Senate  Exercises: - Supine Bridge  - 1 x daily - 3 x weekly - 2 sets - 10 reps  - Standing Single Leg Stance with Counter Support  - 1 x daily - 3 x weekly - 3 sets  - 20 hold - Single Leg Heel Raise with Counter Support  - 1 x daily - 3 x weekly - 2 sets - 10 reps   *Can add to HEP accordingly  GOALS: Goals reviewed with patient? Yes  SHORT TERM GOALS: ALL STGS = LTGS   LONG TERM GOALS: Target date: 10/13/2024  Pt will be independent with final HEP for strength, gait, balance in order to build upon functional gains made in therapy. Baseline:  Goal status: INITIAL  2.  Pt will improve FGA to at least a 24/30 in order to demo decr fall risk. Baseline: 19/30 Goal status: INITIAL  3.  Pt will improve condition 4 of mCTSIB to at least 30 seconds in order to demo improved vestibular input for balance.  Baseline: 20 seconds  Goal status: INITIAL  4.  Pt will improve gait speed with no AD to at least 3.0 ft/sec in order to demo improved community mobility.  Baseline: 12.85 seconds = 2.55 ft/sec  Goal status: INITIAL  5.  Pt will improve 5x sit<>stand to less than or equal to 12 sec to demonstrate improved functional strength and transfer efficiency.   Baseline: 14.8 seconds with no UE support Goal status: INITIAL   ASSESSMENT:  CLINICAL IMPRESSION: Patient was seen today for skilled physical therapy with continued advancement of LE strengthening and cognitive dual tasking. There was emphasis on dual tasking today, especially with regards to direction changes and being able to remember consecutive steps  or recalling previous steps. Patient continues to make improvements with each round of tasks throughout session but  needed continued reminder of next steps with some exercises such as foam multidirectional stepping. Patient continues to be limited by L toe clearance but is able to perform this with constant reminders and is challenged by cognitive dual tasking. They demonstrate improvement in technique with tasks with cueing and repetition. CGA used throughout with little to no correction needed which continues to improve since last session. Patient continues to benefit from skilled PT to address impairments as noted and progress towards long term goals.   OBJECTIVE IMPAIRMENTS: Abnormal gait, decreased activity tolerance, decreased balance, decreased cognition, decreased mobility, decreased strength, and postural dysfunction.   ACTIVITY LIMITATIONS: stairs and locomotion level  PARTICIPATION LIMITATIONS: community activity and occupation  PERSONAL FACTORS: Behavior pattern, Past/current experiences, Time since onset of injury/illness/exacerbation, and 3+ comorbidities: RRMS, cognitive impairment,  ICD implanted 08/23/24 for CHF, HTN, Neuropathy, Type 2 Diaebtes, hx of CVA are also affecting patient's functional outcome.   REHAB POTENTIAL: Good  CLINICAL DECISION MAKING: Stable/uncomplicated  EVALUATION COMPLEXITY: Low  PLAN:  PT FREQUENCY: 2x/week  PT DURATION: 4 weeks  PLANNED INTERVENTIONS: 97164- PT Re-evaluation, 97110-Therapeutic exercises, 97530- Therapeutic activity, 97112- Neuromuscular re-education, 97535- Self Care, 02859- Manual therapy, [phone]- Gait training, Patient/Family education, Balance training, Stair training, and DME instructions  PLAN FOR NEXT SESSION: HEP should be added and updated as patient tolerates and progresses (I.e. sit to stand, standing hip abduction). Continue SLS (add foam or ball throws-he likes basketball), balance with EC and head turns, include steps ups and banded STS for improved hip extension.  Pt requesting to use bicycle end of session (for LE  strengthening/aerobic endurance). *no lifting over 10 pounds until about mid Nov. 2025 (recent ICD implant)  Weighted toe taps to different levels, banded sit to stand, ball slam sit to stand  Check Goals! After goals patient really wants to work on doing the color call out with directions, stepping on and off foam pad. Plan to continue maybe 1 time per week for 3-4 more weeks for PT but need to discuss speech therapy with him and ask about how he thinks his memory is - a possible referral to SLP.  Emmalene Sherry, Student-PT 10/10/2024, 11:35 AM

## 2024-10-11 DIAGNOSIS — E1140 Type 2 diabetes mellitus with diabetic neuropathy, unspecified: Secondary | ICD-10-CM | POA: Diagnosis not present

## 2024-10-12 ENCOUNTER — Ambulatory Visit: Admitting: Physical Therapy

## 2024-10-12 ENCOUNTER — Telehealth: Payer: Self-pay | Admitting: Internal Medicine

## 2024-10-12 NOTE — Telephone Encounter (Signed)
 Patient wants to know if he can now resume normal activities after having his surgery on 10/1.

## 2024-10-15 NOTE — Telephone Encounter (Signed)
 At this point no limitation.

## 2024-10-16 ENCOUNTER — Telehealth: Payer: Self-pay | Admitting: Cardiology

## 2024-10-16 NOTE — Telephone Encounter (Signed)
 Error

## 2024-10-17 ENCOUNTER — Encounter: Payer: Self-pay | Admitting: Cardiology

## 2024-10-18 ENCOUNTER — Ambulatory Visit: Payer: Self-pay | Admitting: Physical Therapy

## 2024-11-02 ENCOUNTER — Telehealth (HOSPITAL_BASED_OUTPATIENT_CLINIC_OR_DEPARTMENT_OTHER): Payer: Self-pay

## 2024-11-02 NOTE — Telephone Encounter (Signed)
° °  Pre-operative Risk Assessment    Patient Name: Thomas Nixon  DOB: [birth year] MRN: [id number]   Date of last office visit: 10/09/24 with Dr. Zenaida Date of next office visit: NA  Request for Surgical Clearance    Procedure:  Colonoscopy  Date of Surgery:  Clearance TBD                                 Surgeon:  Dr. Dianna Socks Group or Practice Name:  [facility]  Phone number:  [phone] Fax number:  [phone]   Type of Clearance Requested:   - Medical  - Pharmacy:  Hold Rivaroxaban  (Xarelto ) not indicated   Type of Anesthesia:  propofol    Additional requests/questions:    Olusegun, Gerstenberger   11/02/2024, 8:19 AM

## 2024-11-07 NOTE — Telephone Encounter (Signed)
 Patient with diagnosis of A Fib on Xarelto  for anticoagulation.    Procedure: colonoscopy Date of procedure: TBD   CHA2DS2-VASc Score = 4  This indicates a 4.8% annual risk of stroke. The patient's score is based upon: CHF History: 0 HTN History: 1 Diabetes History: 1 Stroke History: 2 Vascular Disease History: 0 Age Score: 0 Gender Score: 0    CrCl 113 ml/min Platelet count 231K  Per office protocol, patient can hold Xarelto  for 2 days prior to procedure.    **This guidance is not considered finalized until pre-operative APP has relayed final recommendations.**

## 2024-11-13 ENCOUNTER — Telehealth: Payer: Self-pay | Admitting: Neurology

## 2024-11-13 MED ORDER — VUMERITY 231 MG PO CPDR: 2.0000 | DELAYED_RELEASE_CAPSULE | Freq: Two times a day (BID) | ORAL | 11 refills | Status: DC

## 2024-11-13 NOTE — Telephone Encounter (Signed)
 Refill sent

## 2024-11-13 NOTE — Telephone Encounter (Signed)
 Patient request refill for VUMERITY  231 MG CPDR send to  [facility] [phone]

## 2024-11-14 NOTE — Telephone Encounter (Signed)
"  ° °  Patient Name: Thomas Nixon  DOB: February 12, 1971 MRN: [id number]  Primary Cardiologist: Oneil Parchment, MD  Chart reviewed as part of pre-operative protocol coverage. Given past medical history and time since last visit, based on ACC/AHA guidelines, Thomas Nixon is at acceptable risk for the planned procedure without further cardiovascular testing.   Per Dr. Zenaida  No further optimization or workup prior to planned elective procedure. Well compenated with new systolic heart failure. Can hold xarelto  two days prior, restart after.  Per Pharmacy:  Per office protocol, patient can hold Xarelto  for 2 days prior to procedure.   OAC will need to be restarted following surgery as soon as safely possible at the discretion of the surgeon.  I will route this recommendation to the requesting party via Epic fax function and remove from pre-op pool.  Please call with questions.  Kazia Grisanti E Krystian Ferrentino, NP 11/14/2024, 11:07 AM  "

## 2024-11-14 NOTE — Telephone Encounter (Signed)
 No further optimization or workup prior to planned elective procedure. Well compenated with new systolic heart failure. Can hold xarelto  two days prior, restart after.

## 2024-11-20 NOTE — Telephone Encounter (Signed)
 Pt called to request medication refill   VUMERITY  231 MG CPDR  Pt medication is to be sent to    [id number] [address], [place] - [address] [facility] [address] (Ph: [phone]

## 2024-11-22 ENCOUNTER — Ambulatory Visit: Admitting: Internal Medicine

## 2024-11-22 NOTE — Telephone Encounter (Signed)
"  Refill sent 11/13/24  "

## 2024-11-24 ENCOUNTER — Other Ambulatory Visit: Payer: Self-pay | Admitting: Gastroenterology

## 2024-11-28 ENCOUNTER — Telehealth: Payer: Self-pay | Admitting: Cardiology

## 2024-11-28 MED ORDER — VUMERITY 231 MG PO CPDR: 2.0000 | DELAYED_RELEASE_CAPSULE | Freq: Two times a day (BID) | ORAL | 11 refills | Status: DC

## 2024-11-28 NOTE — Telephone Encounter (Signed)
 Pt c/o medication issue:  1. Name of Medication:   JARDIANCE 10 MG TABS tablet     rivaroxaban  (XARELTO ) 20 MG TABS tablet   2. How are you currently taking this medication (dosage and times per day)? As written  3. Are you having a reaction (difficulty breathing--STAT)? No  4. What is your medication issue? Pt would like a c/b in regards to increase in above medication cost. Pt states one medication is costing $600 while the other is $100, pt would like to know if other medication can be prescribed due to cost, please advise.

## 2024-11-28 NOTE — Addendum Note (Signed)
 Addended by: ONEITA HOIST E on: 11/28/2024 11:08 AM   Modules accepted: Orders

## 2024-11-28 NOTE — Telephone Encounter (Signed)
 Patient called back, relayed message, patient verbalized understand.

## 2024-11-28 NOTE — Telephone Encounter (Signed)
 Lvm 1st attempt. When pt calls back let him know I resent the [facility]ill [facility] and that we apologize for the convenience.

## 2024-11-28 NOTE — Telephone Encounter (Signed)
 Spoke with pt, aware will forward to the pharmacy team to review.

## 2024-11-28 NOTE — Telephone Encounter (Signed)
 Pt called to request to speak to Nurse about getting   a sample of  VUMERITY  231 MG CPDR . Pt did not go into details  about why , just wanted to speak to Nurse

## 2024-11-29 ENCOUNTER — Other Ambulatory Visit (HOSPITAL_COMMUNITY): Payer: Self-pay

## 2024-11-29 ENCOUNTER — Telehealth: Payer: Self-pay | Admitting: Pharmacy Technician

## 2024-11-29 ENCOUNTER — Telehealth: Payer: Self-pay | Admitting: Neurology

## 2024-11-29 MED ORDER — VUMERITY 231 MG PO CPDR: 2.0000 | DELAYED_RELEASE_CAPSULE | Freq: Two times a day (BID) | ORAL | 11 refills | Status: AC

## 2024-11-29 NOTE — Telephone Encounter (Signed)
 Patient Advocate Encounter   The patient was approved for a [employer] grant that will help cover the cost of xarelto /jardiance Total amount awarded, 7500.  Effective: 10/30/24 - 10/29/25   APW:[id number] ERW:EKKEIFP Hmnle:[id number] PI:[id number] [employer] ID: [id number]   Pharmacy provided with approval and processing information. Patient informed via mychart

## 2024-11-29 NOTE — Telephone Encounter (Signed)
 Pt is needing a refill on his VUMERITY  231 MG CPDR and is needing it sent in to the [facility]

## 2024-11-29 NOTE — Telephone Encounter (Signed)
" ° °  Patient Advocate Encounter   The patient was approved for a [employer] grant that will help cover the cost of xarelto /jardiance Total amount awarded, 7500.  Effective: 10/30/24 - 10/29/25   APW:[id number] ERW:EKKEIFP Hmnle:[id number] PI:[id number] [employer] ID: [id number]   Pharmacy provided with approval and processing information. Patient informed via mychart  "

## 2024-11-29 NOTE — Telephone Encounter (Signed)
"  refilled  "

## 2024-12-19 ENCOUNTER — Telehealth: Payer: Self-pay | Admitting: Neurology

## 2024-12-19 NOTE — Telephone Encounter (Signed)
 [facility] are needing [facility] for the pt's  VUMERITY  231 MG CPDR

## 2024-12-20 ENCOUNTER — Other Ambulatory Visit (HOSPITAL_COMMUNITY): Payer: Self-pay

## 2024-12-20 NOTE — Telephone Encounter (Signed)
 PA is not needed. Patient has high deductible plan and has no met deductible for the new year. Patient would benefit from the [facility] Prescription Payment Plan program. Patient would need to call insurance to inquire about this cost saving measure.

## 2024-12-25 ENCOUNTER — Encounter (HOSPITAL_COMMUNITY): Payer: Self-pay | Admitting: Anesthesiology

## 2024-12-25 ENCOUNTER — Ambulatory Visit (HOSPITAL_COMMUNITY): Admission: RE | Admit: 2024-12-25 | Source: Home / Self Care | Admitting: Gastroenterology

## 2024-12-25 ENCOUNTER — Encounter (HOSPITAL_COMMUNITY): Admission: RE | Payer: Self-pay | Source: Home / Self Care

## 2024-12-29 ENCOUNTER — Other Ambulatory Visit: Payer: Self-pay | Admitting: Gastroenterology

## 2025-01-01 ENCOUNTER — Ambulatory Visit: Admitting: Pulmonary Disease

## 2025-01-03 ENCOUNTER — Encounter

## 2025-02-06 ENCOUNTER — Ambulatory Visit (HOSPITAL_COMMUNITY): Admit: 2025-02-06 | Admitting: Gastroenterology

## 2025-02-06 ENCOUNTER — Encounter (HOSPITAL_COMMUNITY): Payer: Self-pay

## 2025-04-04 ENCOUNTER — Ambulatory Visit: Admitting: Neurology

## 2025-04-04 ENCOUNTER — Encounter

## 2025-07-04 ENCOUNTER — Encounter

## 2025-10-03 ENCOUNTER — Encounter

## 2026-01-02 ENCOUNTER — Encounter
